# Patient Record
Sex: Male | Born: 1945 | Race: White | Hispanic: No | Smoking: Never smoker
Health system: Southern US, Community
[De-identification: ages and names within clinical notes are randomized; demographics above are authoritative.]

## PROBLEM LIST (undated history)

## (undated) DIAGNOSIS — E559 Vitamin D deficiency, unspecified: Secondary | ICD-10-CM

## (undated) DIAGNOSIS — I10 Essential (primary) hypertension: Secondary | ICD-10-CM

## (undated) DIAGNOSIS — F32A Depression, unspecified: Secondary | ICD-10-CM

## (undated) DIAGNOSIS — N200 Calculus of kidney: Secondary | ICD-10-CM

## (undated) DIAGNOSIS — F329 Major depressive disorder, single episode, unspecified: Secondary | ICD-10-CM

## (undated) DIAGNOSIS — F0390 Unspecified dementia without behavioral disturbance: Secondary | ICD-10-CM

## (undated) DIAGNOSIS — R413 Other amnesia: Secondary | ICD-10-CM

## (undated) DIAGNOSIS — E785 Hyperlipidemia, unspecified: Secondary | ICD-10-CM

## (undated) DIAGNOSIS — N400 Enlarged prostate without lower urinary tract symptoms: Secondary | ICD-10-CM

## (undated) DIAGNOSIS — G4730 Sleep apnea, unspecified: Secondary | ICD-10-CM

## (undated) DIAGNOSIS — F419 Anxiety disorder, unspecified: Secondary | ICD-10-CM

## (undated) DIAGNOSIS — F6081 Narcissistic personality disorder: Secondary | ICD-10-CM

## (undated) DIAGNOSIS — C801 Malignant (primary) neoplasm, unspecified: Secondary | ICD-10-CM

## (undated) HISTORY — PX: EXTERNAL EAR SURGERY: SHX627

## (undated) HISTORY — PX: OTHER SURGICAL HISTORY: SHX169

## (undated) HISTORY — DX: Anxiety disorder, unspecified: F41.9

## (undated) HISTORY — DX: Hyperlipidemia, unspecified: E78.5

## (undated) HISTORY — DX: Vitamin D deficiency, unspecified: E55.9

## (undated) HISTORY — DX: Unspecified dementia, unspecified severity, without behavioral disturbance, psychotic disturbance, mood disturbance, and anxiety: F03.90

## (undated) HISTORY — DX: Calculus of kidney: N20.0

## (undated) HISTORY — DX: Benign prostatic hyperplasia without lower urinary tract symptoms: N40.0

## (undated) HISTORY — PX: EYE SURGERY: SHX253

## (undated) HISTORY — DX: Essential (primary) hypertension: I10

## (undated) HISTORY — DX: Major depressive disorder, single episode, unspecified: F32.9

## (undated) HISTORY — DX: Other amnesia: R41.3

## (undated) HISTORY — DX: Sleep apnea, unspecified: G47.30

## (undated) HISTORY — DX: Depression, unspecified: F32.A

---
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
## 2002-10-17 ENCOUNTER — Ambulatory Visit (HOSPITAL_COMMUNITY): Admission: RE | Admit: 2002-10-17 | Discharge: 2002-10-17 | Payer: Self-pay | Admitting: Cardiology

## 2002-10-17 ENCOUNTER — Encounter: Payer: Self-pay | Admitting: Cardiology

## 2003-12-31 ENCOUNTER — Other Ambulatory Visit (HOSPITAL_COMMUNITY): Admission: RE | Admit: 2003-12-31 | Discharge: 2004-03-30 | Payer: Self-pay | Admitting: Psychiatry

## 2003-12-31 ENCOUNTER — Ambulatory Visit: Payer: Self-pay | Admitting: Psychiatry

## 2004-08-04 ENCOUNTER — Emergency Department (HOSPITAL_COMMUNITY): Admission: EM | Admit: 2004-08-04 | Discharge: 2004-08-04 | Payer: Self-pay | Admitting: Emergency Medicine

## 2008-05-29 ENCOUNTER — Encounter: Admission: RE | Admit: 2008-05-29 | Discharge: 2008-05-29 | Payer: Self-pay | Admitting: Specialist

## 2008-06-12 HISTORY — PX: OTHER SURGICAL HISTORY: SHX169

## 2010-03-17 ENCOUNTER — Emergency Department (HOSPITAL_COMMUNITY)
Admission: EM | Admit: 2010-03-17 | Discharge: 2010-03-17 | Payer: Self-pay | Source: Home / Self Care | Admitting: Emergency Medicine

## 2010-06-21 LAB — POCT I-STAT, CHEM 8
BUN: 17 mg/dL (ref 6–23)
Calcium, Ion: 1.12 mmol/L (ref 1.12–1.32)
Creatinine, Ser: 1.1 mg/dL (ref 0.4–1.5)
Glucose, Bld: 117 mg/dL — ABNORMAL HIGH (ref 70–99)
HCT: 45 % (ref 39.0–52.0)
Potassium: 4.2 mEq/L (ref 3.5–5.1)

## 2010-08-27 NOTE — Cardiovascular Report (Signed)
NAME:  David [name], David [name]                        ACCOUNT NO.:  [id number]   MEDICAL RECORD NO.:  [id number]                   PATIENT TYPE:  OIB   LOCATION:  2857                                 FACILITY:  [facility]   PHYSICIAN:  Bryson Dames, M.D.             DATE OF BIRTH:  [birth year]   DATE OF PROCEDURE:  10/17/2002  DATE OF DISCHARGE:                              CARDIAC CATHETERIZATION   PROCEDURES PERFORMED:  1. Selective coronary angiography by Judkins technique.  2. Retrograde left heart catheterization.  3. Left ventricular angiography.  4. Abdominal aortography.   COMPLICATIONS:  None.   ENTRY SITE:  Right femoral.   DYE USED:  Omnipaque.   PATIENT PROFILE:  The patient is a 57-yerar-old gentleman with an abnormal  electrocardiogram and a Cardiolite stress test showing mild lateral wall  ischemia.  His ejection fraction calculation was 59%.  The patient has had a  history of some obesity with a 50-pound weight loss, some recent dyspnea on  exertion, and history of treated hyperlipidemia.  After talking to the  patient and his wife, who is a Equities trader, we decided to perform  cardiac catheterization to provide some  reassurance about the constellation  of issues here.  This was performed on an outpatient basis electively with 6  French catheters without complications.   RESULTS:  PRESSURES:  The left ventricular pressure was 99991111, end-diastolic  pressure 14.  Central aortic pressure 130/72, mean of 97. No aortic valve  gradient by pullback technique.   ANGIOGRAPHIC RESULTS:  1. The left main coronary artery was short, but large in caliber and normal.  2. The LAD ended just before the apex of the left ventricle.  It was a     normal small vessel.  A single diagonal branch and a single septal     perforator branches were both normal.  3. The left circumflex is nondominant giving rise to a posterior lateral     branch and two small obtuse marginal branches.   No lesions were seen.  4. The right coronary artery is dominant and shows no lesions.  5. Left ventricular angiogram showed a left ventricular ejection fraction of     50-55% with no mitral regurgitation and no wall motion abnormalities.  6. The abdominal aorta showed no abdominal aortic pathology.  Normal common     iliacs and normal renal arteries.   FINAL DIAGNOSES:  1. Angiographically patent coronary arteries.  2. Normal LV systolic function.  3. Normal renal arteries.  4. Normal abdominal aorta.    PLAN:  Reassurance.  Continue risk factor modification and the patient may  resume his previous activities in about three to four days.  Bryson Dames, M.D.    WHG/MEDQ  D:  10/17/2002  T:  10/17/2002  Job:  [id number]  Sherren Kerns. Pamella Pert, M.D.  [place]  [place] [address]  Fax: [phone]   cc:   Sherren Kerns. Pamella Pert, M.D.  [address]  [place]  [place] [address]  Fax: [phone]

## 2011-05-16 DIAGNOSIS — F330 Major depressive disorder, recurrent, mild: Secondary | ICD-10-CM | POA: Insufficient documentation

## 2011-06-27 DIAGNOSIS — I10 Essential (primary) hypertension: Secondary | ICD-10-CM | POA: Insufficient documentation

## 2012-04-02 ENCOUNTER — Ambulatory Visit (INDEPENDENT_AMBULATORY_CARE_PROVIDER_SITE_OTHER): Payer: Medicare Other | Admitting: Emergency Medicine

## 2012-04-02 VITALS — BP 150/80 | HR 68 | Temp 98.1°F | Resp 18 | Ht 72.58 in | Wt 257.2 lb

## 2012-04-02 DIAGNOSIS — J209 Acute bronchitis, unspecified: Secondary | ICD-10-CM

## 2012-04-02 MED ORDER — HYDROCOD POLST-CHLORPHEN POLST 10-8 MG/5ML PO LQCR: 5.0000 mL | Freq: Two times a day (BID) | ORAL | Status: DC | PRN

## 2012-04-02 MED ORDER — PSEUDOEPHEDRINE-GUAIFENESIN ER 60-600 MG PO TB12: 1.0000 | ORAL_TABLET | Freq: Two times a day (BID) | ORAL | Status: AC

## 2012-04-02 NOTE — Progress Notes (Signed)
[facility] [address], [place] [address] [phone]  Date:  04/02/2012   Name:  David [name]   DOB:  10/13/1945   MRN:  [id number]  PCP:  Gerrit Heck, MD    Chief Complaint: Wheezing   History of Present Illness:  David [name] is a 66 y.o. very pleasant male patient who presents with the following:  Treated by [facility] with tamiflu for prophylaxis after he was seen and tested negative for flu.  Has no fever or chills.  Has a mucoid nasal drainage.  Cough that is not productive and is associated with wheezing and some feeling of breathlessness.  No nausea or vomiting.  No improvement with OTC medications.  There is no problem list on file for this patient.   Past Medical History  Diagnosis Date  . Depression   . Anxiety   . Hyperlipidemia     Past Surgical History  Procedure Date  . Right knee meniscus     History  Substance Use Topics  . Smoking status: Never Smoker   . Smokeless tobacco: Not on file  . Alcohol Use: 1.2 oz/week    2 Glasses of wine per week    Family History  Problem Relation Age of Onset  . Alzheimer's disease Mother   . Cancer Father   . Thyroid disease Father   . Alzheimer's disease Maternal Grandmother   . Heart disease Maternal Grandfather   . Stroke Paternal Grandmother   . Stroke Paternal Grandfather     No Known Allergies  Medication list has been reviewed and updated.  Current Outpatient Prescriptions on File Prior to Visit  Medication Sig Dispense Refill  . clonazePAM (KLONOPIN) 1 MG tablet Take 1 mg by mouth daily. Pt taking 1-2 mg qd prn      . sertraline (ZOLOFT) 100 MG tablet Take 200 mg by mouth daily.      . simvastatin (ZOCOR) 40 MG tablet Take 40 mg by mouth every evening.        Review of Systems:  As per HPI, otherwise negative.    Physical Examination: Filed Vitals:   04/02/12 1349  BP: 150/80  Pulse: 68  Temp: 98.1 F (36.7 C)  Resp: 18   Filed Vitals:   04/02/12 1349  Height: 6' 0.58" (1.844 m)  Weight: 257 lb 3.2 oz (116.665 kg)   Body mass index is 34.33 kg/(m^2). Ideal Body Weight: Weight in (lb) to have BMI = 25: 186.9   GEN: WDWN, NAD, Non-toxic, A & O x 3 HEENT: Atraumatic, Normocephalic. Neck supple. No masses, No LAD. Ears and Nose: No external deformity. CV: RRR, No M/G/R. No JVD. No thrill. No extra heart sounds. PULM: CTA B, no wheezes, crackles, rhonchi. No retractions. No resp. distress. No accessory muscle use. ABD: S, NT, ND, +BS. No rebound. No HSM. EXTR: No c/c/e NEURO Normal gait.  PSYCH: Normally interactive. Conversant. Not depressed or anxious appearing.  Calm demeanor.    Assessment and Plan: Bronchitis tussionex mucinex d Continue tamiflu Follow up as needed  Roselee Culver, MD

## 2012-04-02 NOTE — Patient Instructions (Signed)

## 2012-07-10 DIAGNOSIS — E349 Endocrine disorder, unspecified: Secondary | ICD-10-CM | POA: Insufficient documentation

## 2012-07-10 DIAGNOSIS — N400 Enlarged prostate without lower urinary tract symptoms: Secondary | ICD-10-CM | POA: Insufficient documentation

## 2012-07-10 DIAGNOSIS — E785 Hyperlipidemia, unspecified: Secondary | ICD-10-CM | POA: Insufficient documentation

## 2012-07-10 DIAGNOSIS — E8881 Metabolic syndrome: Secondary | ICD-10-CM | POA: Insufficient documentation

## 2014-08-14 DIAGNOSIS — F6081 Narcissistic personality disorder: Secondary | ICD-10-CM

## 2014-08-14 HISTORY — DX: Narcissistic personality disorder: F60.81

## 2015-02-16 ENCOUNTER — Encounter: Payer: Self-pay | Admitting: *Deleted

## 2015-02-16 ENCOUNTER — Ambulatory Visit (INDEPENDENT_AMBULATORY_CARE_PROVIDER_SITE_OTHER): Payer: Medicare Other | Admitting: Diagnostic Neuroimaging

## 2015-02-16 VITALS — BP 141/90 | HR 60 | Wt 245.4 lb

## 2015-02-16 DIAGNOSIS — F329 Major depressive disorder, single episode, unspecified: Secondary | ICD-10-CM

## 2015-02-16 DIAGNOSIS — R413 Other amnesia: Secondary | ICD-10-CM | POA: Diagnosis not present

## 2015-02-16 DIAGNOSIS — F32A Depression, unspecified: Secondary | ICD-10-CM

## 2015-02-16 NOTE — Progress Notes (Signed)
[facility]  PATIENT: David [name] DOB: 10-03-1945  REFERRING CLINICIAN: Rankins  HISTORY FROM: patient  REASON FOR VISIT: new consult / (last seen in 2011)   HISTORICAL  CHIEF COMPLAINT:  No chief complaint on file.   HISTORY OF PRESENT ILLNESS:   NEW HPI (02/16/15, VRP): Since last visit, has had continued significant psycho-social stress (separated from wife 2012, autistic daughter at home, son is living with alcoholic and raising a baby). Still with significant short term memory. Reports sleep apnea diagnosis many years ago (> 97yrs) and did not tolerate CPAP. Patient was a former Engineer, manufacturing systems, and has noted some mild smell loss.   PRIOR HPI (12/09/09, VRP): 69 year old right-handed male with history of anxiety, hypercholesterolemia and family history of Alzheimer's disease presenting for evaluation of mild memory difficulty. Over the past one year patient is noticed some mild difficulty with short-term memory. He gives examples of having mild difficulty with certain tasks and forgetting steps of sequence of events. He may be in one room, think of getting something from a different room, and while he is walking over to the next room, he forgets what he wanted to get. He still remains physically and mentally active. 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  Has a long history of depression and anxiety and is being treated with combination of medications and therapy. He is quite aware of the limitations of treatment for prevention of Alzheimer's disease. However he is hoping to gain some insight into his likelihood of developing Alzheimer's disease so that he can better plan for the future in terms of his family his needs.  REVIEW OF SYSTEMS: Full 14 system review of systems performed and notable only for chills fatigue snoring itching scalp  moles feeling cold snoring joint pain aching muscles insomnia snoring restless legs memory loss confusion tremor depresion anxiety not enough sleep disinterest in activities suicidal thoughts racing thoughts.   ALLERGIES: No Known Allergies  HOME MEDICATIONS: Outpatient Prescriptions Prior to Visit  Medication Sig Dispense Refill  . ALPRAZolam (XANAX) 1 MG tablet Take 1 mg by mouth at bedtime as needed. Pt taking up to 1mg  qd prn    . sertraline (ZOLOFT) 100 MG tablet Take 200 mg by mouth daily.    . simvastatin (ZOCOR) 40 MG tablet Take 40 mg by mouth every evening.    . Tamsulosin HCl (FLOMAX) 0.4 MG CAPS Take 1 mg by mouth daily.    Marland Kitchen UNABLE TO FIND as needed. Testosterone transdermal patch   Used prn    . chlorpheniramine-HYDROcodone (TUSSIONEX PENNKINETIC ER) 10-8 MG/5ML LQCR Take 5 mLs by mouth every 12 (twelve) hours as needed (cough). 60 mL 0  . clonazePAM (KLONOPIN) 1 MG tablet Take 1 mg by mouth daily. Pt taking 1-2 mg qd prn     No facility-administered medications prior to visit.    PAST MEDICAL HISTORY: Past Medical History  Diagnosis Date  . Depression   . Anxiety   . Hyperlipidemia   . Sleep apnea     CPAP  . Hypertension     PAST SURGICAL HISTORY: Past Surgical History  Procedure Laterality Date  . Right knee meniscus  06/12/2008  . External ear surgery      child  . Right talus repair      dislocated    FAMILY HISTORY: Family History  Problem Relation Age  of Onset  . Alzheimer's disease Mother   . Cancer Father     prostate  . Thyroid disease Father   . Alzheimer's disease Maternal Grandmother   . Heart disease Maternal Grandfather   . Stroke Paternal Grandmother   . Stroke Paternal Grandfather   . Alcoholism Brother     SOCIAL HISTORY:  Social History   Social History  . Marital Status: Married    Spouse Name: [name]  . Number of Children: 2  . Years of Education: 20   Occupational History  .       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 Smoking status: Never Smoker   . Smokeless tobacco: Not on file  . Alcohol Use: 1.2 oz/week    2 Glasses of wine per week     Comment: 1-2 shots Vodka daily  . Drug Use: No     Comment: 02/16/15 marijuana rarely  . Sexual Activity: No   Other Topics Concern  . Not on file   Social History Narrative   Lives at home with wife   Caffeine use- 1-2 Cokes, tea daily     PHYSICAL EXAM  GENERAL EXAM/CONSTITUTIONAL: Vitals:  Filed Vitals:   02/16/15 1055  BP: 141/90  Pulse: 60  Weight: 245 lb 6.4 oz (111.313 kg)     Body mass index is 32.74 kg/(m^2).  No exam data present  Patient is in no distress; well developed, nourished and groomed; neck is supple  CARDIOVASCULAR:  Examination of carotid arteries is normal; no carotid bruits  Regular rate and rhythm, no murmurs  Examination of peripheral vascular system by observation and palpation is normal  EYES:  Ophthalmoscopic exam of optic discs and posterior segments is normal; no papilledema or hemorrhages  MUSCULOSKELETAL:  Gait, strength, tone, movements noted in Neurologic exam below  NEUROLOGIC: MENTAL STATUS:  MMSE - Mini Mental State Exam 02/16/2015  Orientation to time 5  Orientation to Place 5  Registration 3  Attention/ Calculation 5  Recall 2  Language- name 2 objects 2  Language- repeat 1  Language- follow 3 step command 3  Language- read & follow direction 1  Write a sentence 1  Copy design 1  Total score 29    awake, alert, oriented to person, place and time  recent and remote memory intact  normal attention and concentration  language fluent, comprehension intact, naming intact,   fund of knowledge appropriate  CRANIAL NERVE:   2nd - no papilledema on fundoscopic exam  2nd, 3rd, 4th, 6th - pupils equal and reactive to light, visual fields full to confrontation, extraocular muscles intact, no nystagmus  5th - facial sensation symmetric  7th - facial strength  symmetric  8th - hearing intact  9th - palate elevates symmetrically, uvula midline  11th - shoulder shrug symmetric  12th - tongue protrusion midline  MOTOR:   normal bulk and tone, full strength in the BUE, BLE  SENSORY:   normal and symmetric to light touch, pinprick, temperature, vibration  COORDINATION:   finger-nose-finger, fine finger movements normal  REFLEXES:   deep tendon reflexes SLIGHTLY BRISK and symmetric  GAIT/STATION:   narrow based gait; romberg is negative    DIAGNOSTIC DATA (LABS, IMAGING, TESTING) - I reviewed patient records, labs, notes, testing and imaging myself where available.  Lab Results  Component Value Date   HGB 15.3 03/17/2010   HCT 45.0 03/17/2010      Component Value Date/Time   NA 139  03/17/2010 1401   K 4.2 03/17/2010 1401   CL 106 03/17/2010 1401   GLUCOSE 117* 03/17/2010 1401   BUN 17 03/17/2010 1401   CREATININE 1.1 03/17/2010 1401   No results found for: CHOL, HDL, LDLCALC, LDLDIRECT, TRIG, CHOLHDL No results found for: HGBA1C No results found for: VITAMINB12 No results found for: TSH   12/16/09 MRI brain  - normal     ASSESSMENT AND PLAN  69 y.o. year old male here with memory loss since 2010-2011.    Ddx: MCI, mild dementia, pseudo-dementia of depression, sleep disturbance  Memory loss  Depression    PLAN: - consider IDEAS study - then may consider sleep study - then may repeat neuropsychology testing (baseline was in 2011) - then may consider memantine  Return in about 3 months (around 05/19/2015).    Penni Bombard, MD [id number], [id number] AM Certified in Neurology, Neurophysiology and Neuroimaging  [facility] [address] [place], [place] [address] [phone]

## 2015-02-16 NOTE — Patient Instructions (Signed)
Thank you for coming to see [place] at [facility]. I hope we have been able to provide you high quality care today.  You may receive a patient satisfaction survey over the next few weeks. We would appreciate your feedback and comments so that we may continue to improve ourselves and the health of our patients.  - consider IDEAS study - then may consider sleep study - then may repeat neuropsychology testing (baseline was in 2011) - then may consider memantine   ~~~~~~~~~~~~~~~~~~~~~~~~~~~~~~~~~~~~~~~~~~~~~~~~~~~~~~~~~~~~~~~~~  DR. PENUMALLI'S GUIDE TO HAPPY AND HEALTHY LIVING These are some of my general health and wellness recommendations. Some of them may apply to you better than others. Please use common sense as you try these suggestions and feel free to ask me any questions.   ACTIVITY/FITNESS Mental, social, emotional and physical stimulation are very important for brain and body health. Try learning a new activity (arts, music, language, sports, games).  Keep moving your body to the best of your abilities. You can do this at home, inside or outside, the park, community center, gym or anywhere you like. Consider a physical therapist or personal trainer to get started. Consider the app Sworkit. Fitness trackers such as smart-watches, smart-phones or Fitbits can help as well.   NUTRITION Eat more plants: colorful vegetables, nuts, seeds and berries.  Eat less sugar, salt, preservatives and processed foods.  Avoid toxins such as cigarettes and alcohol.  Drink water when you are thirsty. Warm water with a slice of lemon is an excellent morning drink to start the day.  Consider these websites for more information The Nutrition Source (https://www.henry-hernandez.biz/) Precision Nutrition (WindowBlog.ch)   RELAXATION Consider practicing mindfulness meditation or other relaxation techniques such as deep breathing, prayer,  yoga, tai chi, massage. See website mindful.org or the apps Headspace or Calm to help get started.   SLEEP Try to get at least 7-8+ hours sleep per day. Regular exercise and reduced caffeine will help you sleep better. Practice good sleep hygeine techniques. See website sleep.org for more information.   PLANNING Prepare estate planning, living will, healthcare POA documents. Sometimes this is best planned with the help of an attorney. Theconversationproject.org and agingwithdignity.org are excellent resources.

## 2015-02-23 ENCOUNTER — Telehealth: Payer: Self-pay

## 2015-02-23 NOTE — Telephone Encounter (Signed)
I spoke to the patient in regards to the CREAD study. Patient would like to schedule an appointment by email first to talk about the study, as the patient has yet to read the Informed Consent Form. I told the patient that I will email him in a week to setup a phone call.

## 2015-05-13 ENCOUNTER — Ambulatory Visit (INDEPENDENT_AMBULATORY_CARE_PROVIDER_SITE_OTHER): Payer: Medicare Other | Admitting: Diagnostic Neuroimaging

## 2015-05-13 ENCOUNTER — Encounter: Payer: Self-pay | Admitting: Diagnostic Neuroimaging

## 2015-05-13 VITALS — BP 118/72 | HR 62 | Ht 72.0 in | Wt 230.2 lb

## 2015-05-13 DIAGNOSIS — G4733 Obstructive sleep apnea (adult) (pediatric): Secondary | ICD-10-CM

## 2015-05-13 DIAGNOSIS — F32A Depression, unspecified: Secondary | ICD-10-CM

## 2015-05-13 DIAGNOSIS — R413 Other amnesia: Secondary | ICD-10-CM

## 2015-05-13 DIAGNOSIS — F329 Major depressive disorder, single episode, unspecified: Secondary | ICD-10-CM

## 2015-05-13 NOTE — Progress Notes (Signed)
[facility]  PATIENT: David [name] DOB: 06-May-1945  REFERRING CLINICIAN: Rankins  HISTORY FROM: patient  REASON FOR VISIT: follow up   HISTORICAL  CHIEF COMPLAINT:  Chief Complaint  Patient presents with  . Memory Loss    rm 7, MMSE 30, depression  . Follow-up    3 month    HISTORY OF PRESENT ILLNESS:   UPDATE 05/13/15: Since last visit, symptoms continue, esp significant depression and stress related to breakup of his marriage. Noted some stuttering at a family event recently. Some more dizziness spells when standing up.   NEW HPI (02/16/15, VRP): Since last visit, has had continued significant psycho-social stress (separated from wife 2012, autistic daughter at home, son is living with alcoholic and raising a baby). Still with significant short term memory. Reports sleep apnea diagnosis many years ago (> 45yrs) and did not tolerate CPAP. Patient was a former Engineer, manufacturing systems, and has noted some mild smell loss.   PRIOR HPI (12/09/09, VRP): 70 year old right-handed male with history of anxiety, hypercholesterolemia and family history of Alzheimer's disease presenting for evaluation of mild memory difficulty. Over the past one year patient is noticed some mild difficulty with short-term memory. He gives examples of having mild difficulty with certain tasks and forgetting steps of sequence of events. He may be in one room, think of getting something from a different room, and while he is walking over to the next room, he forgets what he wanted to get. He still remains physically and mentally active. 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  Has a long history of depression and anxiety and is being treated with combination of medications and therapy. He is quite aware of the limitations of treatment for prevention of Alzheimer's disease. However  he is hoping to gain some insight into his likelihood of developing Alzheimer's disease so that he can better plan for the future in terms of his family his needs.  REVIEW OF SYSTEMS: Full 14 system review of systems performed and notable only for fatigue scalp snoring insomnia restless legs memory loss confusion tremor depresion anxiety disinterest in activities suicidal thoughts racing thoughts.   ALLERGIES: No Known Allergies  HOME MEDICATIONS: Outpatient Prescriptions Prior to Visit  Medication Sig Dispense Refill  . ALPRAZolam (XANAX) 1 MG tablet Take 1 mg by mouth at bedtime as needed. Pt taking up to 1mg  qd prn    . lamoTRIgine (LAMICTAL) 25 MG tablet Take 25 mg by mouth daily.    . propranolol (INDERAL) 10 MG tablet Take 10 mg by mouth daily.    . sertraline (ZOLOFT) 100 MG tablet Take 200 mg by mouth daily.    . simvastatin (ZOCOR) 40 MG tablet Take 40 mg by mouth every evening.    . Tamsulosin HCl (FLOMAX) 0.4 MG CAPS Take 1 mg by mouth daily.    Marland Kitchen UNABLE TO FIND as needed. Testosterone transdermal patch   Used prn     No facility-administered medications prior to visit.    PAST MEDICAL HISTORY: Past Medical History  Diagnosis Date  . Depression   . Anxiety   . Hyperlipidemia   . Sleep apnea     CPAP  . Hypertension     PAST SURGICAL HISTORY: Past Surgical History  Procedure Laterality Date  . Right knee meniscus  06/12/2008  . External ear surgery  child  . Right talus repair      dislocated    FAMILY HISTORY: Family History  Problem Relation Age of Onset  . Alzheimer's disease Mother   . Cancer Father     prostate  . Thyroid disease Father   . Alzheimer's disease Maternal Grandmother   . Heart disease Maternal Grandfather   . Stroke Paternal Grandmother   . Stroke Paternal Grandfather   . Alcoholism Brother     SOCIAL HISTORY:  Social History   Social History  . Marital Status: Married    Spouse Name: [name]  . Number of Children: 2  .  Years of Education: 20   Occupational History  .       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 Smoking status: Never Smoker   . Smokeless tobacco: Not on file  . Alcohol Use: 1.2 oz/week    2 Glasses of wine per week     Comment: 1-2 shots Vodka daily  . Drug Use: No     Comment: 02/16/15 marijuana rarely, 05/13/15 occassionally  . Sexual Activity: No   Other Topics Concern  . Not on file   Social History Narrative   Lives at home with wife   Caffeine use- 1-2 Cokes, tea daily     PHYSICAL EXAM  GENERAL EXAM/CONSTITUTIONAL: Vitals:  Filed Vitals:   05/13/15 1307  BP: 118/72  Pulse: 62  Height: 6' (1.829 m)  Weight: 230 lb 3.2 oz (104.418 kg)   Body mass index is 31.21 kg/(m^2). No exam data present  Patient is in no distress; well developed, nourished and groomed; neck is supple  CARDIOVASCULAR:  Examination of carotid arteries is normal; no carotid bruits  Regular rate and rhythm, no murmurs  Examination of peripheral vascular system by observation and palpation is normal   MUSCULOSKELETAL:  Gait, strength, tone, movements noted in Neurologic exam below  NEUROLOGIC: MENTAL STATUS:  MMSE - [place] Exam 05/13/2015 02/16/2015  Orientation to time 5 5  Orientation to Place 5 5  Registration 3 3  Attention/ Calculation 5 5  Recall 3 2  Language- name 2 objects 2 2  Language- repeat 1 1  Language- follow 3 step command 3 3  Language- read & follow direction 1 1  Write a sentence 1 1  Copy design 1 1  Total score 30 29    awake, alert, oriented to person, place and time  recent and remote memory intact  normal attention and concentration  language fluent, comprehension intact, naming intact,   fund of knowledge appropriate  CRANIAL NERVE:   2nd, 3rd, 4th, 6th - pupils equal and reactive to light, visual fields full to confrontation, extraocular muscles intact, no nystagmus  5th - facial sensation symmetric  7th - facial  strength symmetric  8th - hearing intact  9th - palate elevates symmetrically, uvula midline  11th - shoulder shrug symmetric  12th - tongue protrusion midline  MOTOR:   normal bulk and tone, full strength in the BUE, BLE  SENSORY:   normal and symmetric to light touch, temperature, vibration  COORDINATION:   finger-nose-finger, fine finger movements normal  REFLEXES:   deep tendon reflexes SLIGHTLY BRISK and symmetric  GAIT/STATION:   narrow based gait; tandem stable; romberg is negative    DIAGNOSTIC DATA (LABS, IMAGING, TESTING) - I reviewed patient records, labs, notes, testing and imaging myself where available.  Lab Results  Component Value Date   HGB 15.3 03/17/2010  HCT 45.0 03/17/2010      Component Value Date/Time   NA 139 03/17/2010 1401   K 4.2 03/17/2010 1401   CL 106 03/17/2010 1401   GLUCOSE 117* 03/17/2010 1401   BUN 17 03/17/2010 1401   CREATININE 1.1 03/17/2010 1401   No results found for: CHOL, HDL, LDLCALC, LDLDIRECT, TRIG, CHOLHDL No results found for: HGBA1C No results found for: VITAMINB12 No results found for: TSH   12/16/09 MRI brain  - normal     ASSESSMENT AND PLAN  70 y.o. year old male here with memory loss since 2010-2011. More depression lately.    Ddx: MCI, mild dementia, pseudo-dementia of depression, sleep disturbance  Memory loss  Depression    PLAN: - consider IDEAS study - setup sleep study - follow up with psychiatry/psychology re: depression  Orders Placed This Encounter  Procedures  . Ambulatory referral to Sleep Studies   Return in about 6 months (around 11/10/2015).    Penni Bombard, MD [id number], [id number] PM Certified in Neurology, Neurophysiology and Neuroimaging  [facility] [address], [place], [place] [address] [phone]

## 2015-05-13 NOTE — Patient Instructions (Signed)
Thank you for coming to see [place] at [facility]. I hope we have been able to provide you high quality care today.  You may receive a patient satisfaction survey over the next few weeks. We would appreciate your feedback and comments so that we may continue to improve ourselves and the health of our patients.  - I will setup sleep study - Follow up with psychiatry/psychology   ~~~~~~~~~~~~~~~~~~~~~~~~~~~~~~~~~~~~~~~~~~~~~~~~~~~~~~~~~~~~~~~~~  DR. PENUMALLI'S GUIDE TO HAPPY AND HEALTHY LIVING These are some of my general health and wellness recommendations. Some of them may apply to you better than others. Please use common sense as you try these suggestions and feel free to ask me any questions.   ACTIVITY/FITNESS Mental, social, emotional and physical stimulation are very important for brain and body health. Try learning a new activity (arts, music, language, sports, games).  Keep moving your body to the best of your abilities. You can do this at home, inside or outside, the park, community center, gym or anywhere you like. Consider a physical therapist or personal trainer to get started. Consider the app Sworkit. Fitness trackers such as smart-watches, smart-phones or Fitbits can help as well.   NUTRITION Eat more plants: colorful vegetables, nuts, seeds and berries.  Eat less sugar, salt, preservatives and processed foods.  Avoid toxins such as cigarettes and alcohol.  Drink water when you are thirsty. Warm water with a slice of lemon is an excellent morning drink to start the day.  Consider these websites for more information The Nutrition Source (https://www.henry-hernandez.biz/) Precision Nutrition (WindowBlog.ch)   RELAXATION Consider practicing mindfulness meditation or other relaxation techniques such as deep breathing, prayer, yoga, tai chi, massage. See website mindful.org or the apps Headspace or Calm to help  get started.   SLEEP Try to get at least 7-8+ hours sleep per day. Regular exercise and reduced caffeine will help you sleep better. Practice good sleep hygeine techniques. See website sleep.org for more information.   PLANNING Prepare estate planning, living will, healthcare POA documents. Sometimes this is best planned with the help of an attorney. Theconversationproject.org and agingwithdignity.org are excellent resources.

## 2015-05-25 ENCOUNTER — Ambulatory Visit (INDEPENDENT_AMBULATORY_CARE_PROVIDER_SITE_OTHER): Payer: Medicare Other | Admitting: Neurology

## 2015-05-25 ENCOUNTER — Encounter: Payer: Self-pay | Admitting: Neurology

## 2015-05-25 VITALS — BP 126/78 | HR 70 | Resp 20 | Ht 72.0 in | Wt 234.0 lb

## 2015-05-25 DIAGNOSIS — R0683 Snoring: Secondary | ICD-10-CM | POA: Diagnosis not present

## 2015-05-25 DIAGNOSIS — G4719 Other hypersomnia: Secondary | ICD-10-CM | POA: Diagnosis not present

## 2015-05-25 DIAGNOSIS — F329 Major depressive disorder, single episode, unspecified: Secondary | ICD-10-CM

## 2015-05-25 DIAGNOSIS — R351 Nocturia: Secondary | ICD-10-CM | POA: Diagnosis not present

## 2015-05-25 NOTE — Progress Notes (Signed)
SLEEP MEDICINE CLINIC   Provider:  Larey Seat, M D  Referring Provider: Leighton Ruff, MD Primary Care Physician:  Gerrit Heck, MD  Chief Complaint  Patient presents with  . New Patient (Initial Visit)    [name] referral, snores at night, rm 11, alone    HPI:  David [name] is a 70 y.o. male , seen here as a referral from [name] for a sleep evaluation,   Chief complaint according to patient :  "I have memory loss and I snore- could one be a cause for the other?" The patient endorsed the geriatric depression score at 13 points, the Epworth sleepiness score at 11 points and the fatigue severity score at 38 points. 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  He has always been physically very active but was diagnosed with hypo-testosteronemia and started on a supplement by skin. He became irritable and his personality changed under testosterone supplementation. He has for that reason had the medication discontinued. He takes Tamsolusin against BPH and controls his nocturia. About 2-3 years ago he had 3-4 nocturias at night. He is severely depressed, is separated from his wife of 28 years, has a 58 year old autistic daughter and a 42 year old mother with Alzheimer's. His son interrupted his college and works in a blue collar job, has a girlfriend that has a 31 month old child but not from him, and is expecting another.    Sleep habits are as follows: The patient usually goes to the bedroom at her 1 1 AM and falls asleep rather promptly he would team he wakes up spontaneously during the night, but has not as many nocturia breaks as he used to. He rises in the morning at about 8 AM, without an alarm be needed. He is exercising physically in the morning. He is exposing himself to natural daylight. His overall nocturnal sleep time is about 7 hours and he wakes  usually refreshed -he does wake with a dry mouth but not with headaches. He keeps an exercise routine and he has also made dietary changes allowing him to lose weight. He usually eats a light lunch, he hydrates well. He naps 2-3 times a week for a duration of about 60-90 minutes. His nocturnal sleep is uninterrupted by anything such as snoring, gasping, choking. He does  report nightmares infrequently. He will wake in AM with a memory of a dream.  He has not experienced sleep paralysis. He didn't act out dreams, has no night terrors , seizures or sleep walking.   Sleep medical history and family sleep history:  Family history negative for sleep apnea.  Social history: father of a daughter, working as an Nurse, children's, sings in 23 languages. He has a chemistry degree, and is multilingual.    Review of Systems: Out of a complete 14 system review, the patient complains of only the following symptoms, and all other reviewed systems are negative. Memory loss, insomnia, snoring, Restless legs, dizziness, confusion. suicidal thoughts in the past, major depression a treated by Dr. Drema Dallas.   Epworth score 11 .  Social History   Social History  . Marital Status: Married    Spouse Name: [name]  . Number of Children: 2  . Years of Education: 20   Occupational History  .       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 Smoking  status: Never Smoker   . Smokeless tobacco: Not on file  . Alcohol Use: 1.2 oz/week    2 Glasses of wine per week     Comment: 1-2 shots Vodka daily  . Drug Use: No     Comment: 02/16/15 marijuana rarely, 05/13/15 occassionally  . Sexual Activity: No   Other Topics Concern  . Not on file   Social History Narrative   Lives at home with wife   Caffeine use- 1-2 Cokes, tea daily    Family History  Problem Relation Age of Onset  . Alzheimer's disease Mother   . Cancer Father     prostate  . Thyroid disease Father   . Alzheimer's disease Maternal  Grandmother   . Heart disease Maternal Grandfather   . Stroke Paternal Grandmother   . Stroke Paternal Grandfather   . Alcoholism Brother     Past Medical History  Diagnosis Date  . Depression   . Anxiety   . Hyperlipidemia   . Sleep apnea     CPAP  . Hypertension     Past Surgical History  Procedure Laterality Date  . Right knee meniscus  06/12/2008  . External ear surgery      child  . Right talus repair      dislocated    Current Outpatient Prescriptions  Medication Sig Dispense Refill  . ALPRAZolam (XANAX) 1 MG tablet Take 1 mg by mouth at bedtime as needed. Pt taking up to 1mg  qd prn    . hydrOXYzine (ATARAX/VISTARIL) 25 MG tablet Take 25 mg by mouth at bedtime.     . lamoTRIgine (LAMICTAL) 25 MG tablet Take 25 mg by mouth daily.    . propranolol (INDERAL) 10 MG tablet Take 10 mg by mouth daily.    . sertraline (ZOLOFT) 100 MG tablet Take 200 mg by mouth daily.    . simvastatin (ZOCOR) 40 MG tablet Take 40 mg by mouth every evening.    . Tamsulosin HCl (FLOMAX) 0.4 MG CAPS Take 1 mg by mouth daily.     No current facility-administered medications for this visit.    Allergies as of 05/25/2015  . (No Known Allergies)    Vitals: BP 126/78 mmHg  Pulse 70  Resp 20  Ht 6' (1.829 m)  Wt 234 lb (106.142 kg)  BMI 31.73 kg/m2 Last Weight:  Wt Readings from Last 1 Encounters:  05/25/15 234 lb (106.142 kg)   [id number] mass index is 31.73 kg/(m^2).     Last Height:   Ht Readings from Last 1 Encounters:  05/25/15 6' (1.829 m)    Physical exam:  General: The patient is awake, alert and appears not in acute distress. The patient is well groomed. Head: Normocephalic, atraumatic. Neck is supple. Mallampati 3,  neck circumference: 17. Nasal airflow  Unrestricted , TMJ is evident . Retrognathia is seen.  Cardiovascular:  Regular rate and rhythm , without  murmurs or carotid bruit, and without distended neck veins. Respiratory: Lungs are clear to auscultation. Skin:   Without evidence of edema, or rash Trunk: BMI is elevated  The patient's posture is erect.   Neurologic exam : The patient is awake and alert, oriented to place and time.    Memory testing pre [name]   MOCA:No flowsheet data found. MMSE: MMSE - Mini Mental State Exam 05/13/2015 02/16/2015  Orientation to time 5 5  Orientation to Place 5 5  Registration 3 3  Attention/ Calculation 5 5  Recall 3 2  Language-  name 2 objects 2 2  Language- repeat 1 1  Language- follow 3 step command 3 3  Language- read & follow direction 1 1  Write a sentence 1 1  Copy design 1 1  Total score 30 29       Attention span & concentration ability appears normal.  Speech is fluent,  without dysarthria, dysphonia or aphasia.  Mood and affect are appropriate.  Cranial nerves: Pupils are equal and briskly reactive to light. Funduscopic exam without evidence of pallor or edema. Extraocular movements  in vertical and horizontal planes intact and without nystagmus. Visual fields by finger perimetry are intact. Hearing to finger rub intact.   Facial sensation intact to fine touch.  Facial motor strength is symmetric and tongue and uvula move midline. Shoulder shrug was symmetrical.   Motor exam:   tone, muscle bulk and symmetric strength in all extremities.  Sensory:  Fine touch, pinprick and vibration , Proprioception tested in the upper extremities was normal.  Coordination: Rapid alternating movements ,Finger-to-nose maneuver without evidence of ataxia, dysmetria or tremor.  Gait and station: Patient walks without assistive device and is able unassisted to climb up to the exam table. Strength within normal limits.  Stance is stable and normal .Tandem gait is unfragmented. Turns with 3 Steps.   Deep tendon reflexes: in the upper and lower extremities are symmetric and intact. Babinski maneuver response is downgoing.  The patient was advised of the nature of the diagnosed sleep disorder , the  treatment options and risks for general a health and wellness arising from not treating the condition.  I spent more than 45 minutes of face to face time with the patient. Greater than 50% of time was spent in counseling and coordination of care. We have discussed the diagnosis and differential and I answered the patient's questions.     Assessment:  After physical and neurologic examination, review of laboratory studies,   Personal review of imaging studies, reports of other /same  Imaging studies, neurophysiology testing and pre-existing records as far as provided in visit., my assessment is   1) patient may have more fatigue and sleepiness related to his clinical depression and stressors of his social environs.   2) snoring- rule out sleep apnea - if possible by HST.   3) no capnography needed.    Plan:  Treatment plan and additional workup :   PSG ,  RV after sleep testing, If negative, will return to [name].     Asencion Partridge Kenia Teagarden MD  05/25/2015   CC; Andrey Spearman MD  CC: Leighton Ruff, [address] [place], [place] [address]

## 2015-06-10 ENCOUNTER — Ambulatory Visit (INDEPENDENT_AMBULATORY_CARE_PROVIDER_SITE_OTHER): Payer: Medicare Other | Admitting: Neurology

## 2015-06-10 DIAGNOSIS — R351 Nocturia: Secondary | ICD-10-CM

## 2015-06-10 DIAGNOSIS — G4719 Other hypersomnia: Secondary | ICD-10-CM

## 2015-06-10 DIAGNOSIS — R0683 Snoring: Secondary | ICD-10-CM

## 2015-06-10 DIAGNOSIS — F329 Major depressive disorder, single episode, unspecified: Secondary | ICD-10-CM

## 2015-06-10 DIAGNOSIS — G4710 Hypersomnia, unspecified: Secondary | ICD-10-CM

## 2015-06-11 NOTE — Sleep Study (Signed)
Please see the scanned sleep study interpretation located in the procedure tab within the chart review section.   

## 2015-06-17 ENCOUNTER — Telehealth: Payer: Self-pay

## 2015-06-17 DIAGNOSIS — G4761 Periodic limb movement disorder: Secondary | ICD-10-CM

## 2015-06-17 DIAGNOSIS — G4733 Obstructive sleep apnea (adult) (pediatric): Secondary | ICD-10-CM

## 2015-06-17 NOTE — Telephone Encounter (Signed)
Patient returned call. He is aware of results and recommendations and is willing to proceed with treatment. Order is in. I will send report to PCP and Tito Dine per patient request.

## 2015-06-17 NOTE — Telephone Encounter (Signed)
LM for patient to call back for sleep study results. I will put in order for titration study.

## 2015-07-26 ENCOUNTER — Ambulatory Visit (INDEPENDENT_AMBULATORY_CARE_PROVIDER_SITE_OTHER): Payer: Medicare Other | Admitting: Neurology

## 2015-07-26 DIAGNOSIS — G4733 Obstructive sleep apnea (adult) (pediatric): Secondary | ICD-10-CM

## 2015-07-26 DIAGNOSIS — G4761 Periodic limb movement disorder: Secondary | ICD-10-CM

## 2015-07-27 NOTE — Sleep Study (Signed)
Please see the scanned sleep study interpretation located in the Procedure tab within the Chart Review section. 

## 2015-07-30 ENCOUNTER — Telehealth: Payer: Self-pay

## 2015-07-30 DIAGNOSIS — G4733 Obstructive sleep apnea (adult) (pediatric): Secondary | ICD-10-CM

## 2015-07-30 NOTE — Telephone Encounter (Signed)
Spoke to pt. I advised him that I have his sleep study results. He says that he is out to lunch and will call me later to discuss his sleep study results. I gave him our clinic phone number and my name.

## 2015-07-30 NOTE — Telephone Encounter (Signed)
Spoke to pt. I advised him that his sleep study results showed significant improvement with less respiratory events during titration. Dr. Brett Fairy recommends that pt start a cpap. Pt states that he is "worried about the economics of starting a new cpap" and wants to "compare the new data to the data from his old sleep study" before making a decision. I advised pt that there were frequent PLMS of his sleep that was associated with sleep disruption and treatment could be considered if the PLMS do not resolve with cpap therapy. I advised pt to avoid caffeine containing beverages and chocolate.  I offered a follow up appt to discuss with Dr. Brett Fairy but pt declined, he wants to look at his sleep study results first. He says he is coming by this afternoon to pick up the sleep study results and then he will call me with a decision regarding an office visit. Pt is asking me to go ahead and send the order for cpap to a DME so he can get pricing on it. I assured him that sending an order to a DME will only give him pricing and he does not have to get the cpap if after he gets the pricing, he decides the cpap is too expensive. Will send to [facility]. A copy of pt's sleep study was placed at the front desk. Pt was advised to not drive or operate hazardous machinery when sleepy. Pt verbalized understanding of all the above information.  Pt was advised that he must sign a medical records release form before he can pick up his sleep study results. Pt verbalized understanding.

## 2015-07-30 NOTE — Telephone Encounter (Signed)
Pt returned Kristen's call °

## 2015-07-31 DIAGNOSIS — Z0289 Encounter for other administrative examinations: Secondary | ICD-10-CM

## 2015-08-17 ENCOUNTER — Encounter: Payer: Self-pay | Admitting: *Deleted

## 2015-11-12 ENCOUNTER — Ambulatory Visit (INDEPENDENT_AMBULATORY_CARE_PROVIDER_SITE_OTHER): Payer: Medicare Other | Admitting: Diagnostic Neuroimaging

## 2015-11-12 ENCOUNTER — Encounter: Payer: Self-pay | Admitting: Diagnostic Neuroimaging

## 2015-11-12 VITALS — BP 106/64 | HR 75 | Wt 234.0 lb

## 2015-11-12 DIAGNOSIS — G4733 Obstructive sleep apnea (adult) (pediatric): Secondary | ICD-10-CM

## 2015-11-12 DIAGNOSIS — R413 Other amnesia: Secondary | ICD-10-CM

## 2015-11-12 DIAGNOSIS — F329 Major depressive disorder, single episode, unspecified: Secondary | ICD-10-CM | POA: Diagnosis not present

## 2015-11-12 DIAGNOSIS — F32A Depression, unspecified: Secondary | ICD-10-CM

## 2015-11-12 NOTE — Progress Notes (Signed)
[facility]  PATIENT: David [name] DOB: 1945/04/16  REFERRING CLINICIAN: Rankins  HISTORY FROM: patient  REASON FOR VISIT: follow up   HISTORICAL  CHIEF COMPLAINT:  Chief Complaint  Patient presents with  . Memory Loss    rm 7, MMSE 30  . Follow-up    6 month    HISTORY OF PRESENT ILLNESS:   UPDATE 11/12/15: Since last visit, memory loss is stable. Had sleep study, now dx'd with OSA, and recommended to have CPAP (pt not started yet, due to finances). Still with sig family stress (wife, daughter, son).   UPDATE 05/13/15: Since last visit, symptoms continue, esp significant depression and stress related to breakup of his marriage. Noted some stuttering at a family event recently. Some more dizziness spells when standing up.   NEW HPI (02/16/15, VRP): Since last visit, has had continued significant psycho-social stress (separated from wife 2012, autistic daughter at home, son is living with alcoholic and raising a baby). Still with significant short term memory. Reports sleep apnea diagnosis many years ago (> 65yrs) and did not tolerate CPAP. Patient was a former Engineer, manufacturing systems, and has noted some mild smell loss.   PRIOR HPI (12/09/09, VRP): 70 year old right-handed male with history of anxiety, hypercholesterolemia and family history of Alzheimer's disease presenting for evaluation of mild memory difficulty. Over the past one year patient is noticed some mild difficulty with short-term memory. He gives examples of having mild difficulty with certain tasks and forgetting steps of sequence of events. He may be in one room, think of getting something from a different room, and while he is walking over to the next room, he forgets what he wanted to get. He still remains physically and mentally active. 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  Has a  long history of depression and anxiety and is being treated with combination of medications and therapy. He is quite aware of the limitations of treatment for prevention of Alzheimer's disease. However he is hoping to gain some insight into his likelihood of developing Alzheimer's disease so that he can better plan for the future in terms of his family his needs.   REVIEW OF SYSTEMS: Full 14 system review of systems performed and negative except tremor agitation confusion depression anxiety suicidal thoughts.   ALLERGIES: No Known Allergies  HOME MEDICATIONS: Outpatient Medications Prior to Visit  Medication Sig Dispense Refill  . ALPRAZolam (XANAX) 1 MG tablet Take 1 mg by mouth at bedtime as needed. Pt taking up to 1mg  qd prn    . hydrOXYzine (ATARAX/VISTARIL) 25 MG tablet Take 25 mg by mouth at bedtime.     . lamoTRIgine (LAMICTAL) 25 MG tablet Take 25 mg by mouth daily.    . propranolol (INDERAL) 10 MG tablet Take 10 mg by mouth daily.    . sertraline (ZOLOFT) 100 MG tablet Take 200 mg by mouth daily.    . simvastatin (ZOCOR) 40 MG tablet Take 40 mg by mouth every evening.    . Tamsulosin HCl (FLOMAX) 0.4 MG CAPS Take 1 mg by mouth daily.     No facility-administered medications prior to visit.     PAST MEDICAL HISTORY: Past Medical History:  Diagnosis Date  . Anxiety   . Depression   . Hyperlipidemia   . Hypertension   . Sleep apnea    CPAP    PAST  SURGICAL HISTORY: Past Surgical History:  Procedure Laterality Date  . EXTERNAL EAR SURGERY     child  . EYE SURGERY Bilateral    cataract  . right knee meniscus  06/12/2008  . right talus repair     dislocated    FAMILY HISTORY: Family History  Problem Relation Age of Onset  . Alzheimer's disease Mother   . Cancer Father     prostate  . Thyroid disease Father   . Alzheimer's disease Maternal Grandmother   . Heart disease Maternal Grandfather   . Stroke Paternal Grandmother   . Stroke Paternal Grandfather   .  Alcoholism Brother     SOCIAL HISTORY:  Social History   Social History  . Marital status: Legally Separated    Spouse name: [name]  . Number of children: 2  . Years of education: 20   Occupational History  .       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 Smoking status: Never Smoker  . Smokeless tobacco: Not on file  . Alcohol use 1.2 oz/week    2 Glasses of wine per week     Comment: 1-2 shots Vodka daily  . Drug use: No     Comment: 02/16/15 marijuana rarely, 05/13/15 occassionally  . Sexual activity: No   Other Topics Concern  . Not on file   Social History Narrative   Lives at home with wife   Caffeine use- 1-2 Cokes, tea daily     PHYSICAL EXAM  GENERAL EXAM/CONSTITUTIONAL: Vitals:  Vitals:   11/12/15 1355  BP: 106/64  Pulse: 75  Weight: 234 lb (106.1 kg)   Wt Readings from Last 3 Encounters:  11/12/15 234 lb (106.1 kg)  05/25/15 234 lb (106.1 kg)  05/13/15 230 lb 3.2 oz (104.4 kg)    Body mass index is 31.74 kg/m. No exam data present  Patient is in no distress; well developed, nourished and groomed; neck is supple  CARDIOVASCULAR:  Examination of carotid arteries is normal; no carotid bruits  Regular rate and rhythm, no murmurs  Examination of peripheral vascular system by observation and palpation is normal   MUSCULOSKELETAL:  Gait, strength, tone, movements noted in Neurologic exam below  NEUROLOGIC: MENTAL STATUS:  MMSE - [place] Exam 11/12/2015 05/13/2015 02/16/2015  Orientation to time 5 5 5   Orientation to Place 5 5 5   Registration 3 3 3   Attention/ Calculation 5 5 5   Recall 3 3 2   Language- name 2 objects 2 2 2   Language- repeat 1 1 1   Language- follow 3 step command 3 3 3   Language- read & follow direction 1 1 1   Write a sentence 1 1 1   Copy design 1 1 1   Total score 30 30 29     awake, alert, oriented to person, place and time  recent and remote memory intact  normal attention and  concentration  language fluent, comprehension intact, naming intact,   fund of knowledge appropriate  CRANIAL NERVE:   2nd, 3rd, 4th, 6th - pupils equal and reactive to light, visual fields full to confrontation, extraocular muscles intact, no nystagmus  5th - facial sensation symmetric  7th - facial strength symmetric  8th - hearing intact  9th - palate elevates symmetrically, uvula midline  11th - shoulder shrug symmetric  12th - tongue protrusion midline  MOTOR:   normal bulk and tone, full strength in the BUE, BLE  SENSORY:   normal and symmetric to light touch,  temperature, vibration  COORDINATION:   finger-nose-finger, fine finger movements normal  REFLEXES:   deep tendon reflexes SLIGHTLY BRISK and symmetric  GAIT/STATION:   narrow based gait; tandem stable; romberg is negative    DIAGNOSTIC DATA (LABS, IMAGING, TESTING) - I reviewed patient records, labs, notes, testing and imaging myself where available.  Lab Results  Component Value Date   HGB 15.3 03/17/2010   HCT 45.0 03/17/2010      Component Value Date/Time   NA 139 03/17/2010 1401   K 4.2 03/17/2010 1401   CL 106 03/17/2010 1401   GLUCOSE 117 (H) 03/17/2010 1401   BUN 17 03/17/2010 1401   CREATININE 1.1 03/17/2010 1401   No results found for: CHOL, HDL, LDLCALC, LDLDIRECT, TRIG, CHOLHDL No results found for: HGBA1C No results found for: VITAMINB12 No results found for: TSH   12/16/09 MRI brain  - normal     ASSESSMENT AND PLAN  70 y.o. year old male here with memory loss since 2010-2011. Family stress and depression and anxiety continue. Overall memory is stable. Neurodegenerative process is felt to be less likely at this time.   Ddx: MCI, pseudo-dementia of depression, sleep disturbance  Memory loss  Depression  OSA (obstructive sleep apnea)    PLAN: - consider IDEAS study - encouraged pt to start CPAP for OSA - continue follow up with psychiatry Seward Grater) re:  depression/anxiety  Return in about 1 year (around 11/11/2016).    Penni Bombard, MD [id number], 0000000 PM Certified in Neurology, Neurophysiology and Neuroimaging  [facility] [address], [place], [place] [address] [phone]

## 2015-11-12 NOTE — Patient Instructions (Addendum)
-   monitor symptoms - try to start CPAP for sleep apnea - continue exercise regimen

## 2016-04-11 HISTORY — PX: CATARACT EXTRACTION, BILATERAL: SHX1313

## 2016-11-14 ENCOUNTER — Encounter: Payer: Self-pay | Admitting: Diagnostic Neuroimaging

## 2016-11-14 ENCOUNTER — Encounter: Payer: Self-pay | Admitting: Psychology

## 2016-11-14 ENCOUNTER — Ambulatory Visit (INDEPENDENT_AMBULATORY_CARE_PROVIDER_SITE_OTHER): Payer: Medicare Other | Admitting: Diagnostic Neuroimaging

## 2016-11-14 VITALS — BP 127/82 | HR 61 | Wt 241.0 lb

## 2016-11-14 DIAGNOSIS — R413 Other amnesia: Secondary | ICD-10-CM | POA: Diagnosis not present

## 2016-11-14 DIAGNOSIS — G4733 Obstructive sleep apnea (adult) (pediatric): Secondary | ICD-10-CM

## 2016-11-14 DIAGNOSIS — F329 Major depressive disorder, single episode, unspecified: Secondary | ICD-10-CM | POA: Diagnosis not present

## 2016-11-14 DIAGNOSIS — F419 Anxiety disorder, unspecified: Secondary | ICD-10-CM | POA: Diagnosis not present

## 2016-11-14 DIAGNOSIS — F32A Depression, unspecified: Secondary | ICD-10-CM

## 2016-11-14 NOTE — Patient Instructions (Addendum)
-   I will setup neuropsychology testing (memory testing)  - follow up CPAP for sleep apnea  - follow up with psychiatry / psychology clinic

## 2016-11-14 NOTE — Progress Notes (Signed)
[facility]  PATIENT: David [name] DOB: [birth year]  REFERRING CLINICIAN: Rankins  HISTORY FROM: patient  REASON FOR VISIT: follow up   HISTORICAL  CHIEF COMPLAINT:  Chief Complaint  Patient presents with  . Memory Loss    rm 6, MMSE 28  . Follow-up    one year    HISTORY OF PRESENT ILLNESS:   UPDATE 11/14/16: Since last visit, memory loss worsening. Anxiety also worsening. Diff with tax returns. Diff remembering if he took his meds. More suicidal thoughts, but able to contract for safety. Seeing [facility] for psychiatry / psychology.  UPDATE 11/12/15: Since last visit, memory loss is stable. Had sleep study, now dx'd with OSA, and recommended to have CPAP (pt not started yet, due to finances). Still with sig family stress (wife, daughter, son).   UPDATE 05/13/15: Since last visit, symptoms continue, esp significant depression and stress related to breakup of his marriage. Noted some stuttering at a family event recently. Some more dizziness spells when standing up.   NEW HPI (02/16/15, VRP): Since last visit, has had continued significant psycho-social stress (separated from wife 2012, autistic daughter at home, son is living with alcoholic and raising a baby). Still with significant short term memory. Reports sleep apnea diagnosis many years ago (> 79yrs) and did not tolerate CPAP. Patient was a former Engineer, manufacturing systems, and has noted some mild smell loss.   PRIOR HPI (12/09/09, VRP): 71 year old right-handed male with history of anxiety, hypercholesterolemia and family history of Alzheimer's disease presenting for evaluation of mild memory difficulty. Over the past one year patient is noticed some mild difficulty with short-term memory. He gives examples of having mild difficulty with certain tasks and forgetting steps of sequence of events. He may be in one room, think of getting something from a different room, and while he is walking over to  the next room, he forgets what he wanted to get. He still remains physically and mentally active. 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  Has a long history of depression and anxiety and is being treated with combination of medications and therapy. He is quite aware of the limitations of treatment for prevention of Alzheimer's disease. However he is hoping to gain some insight into his likelihood of developing Alzheimer's disease so that he can better plan for the future in terms of his family his needs.   REVIEW OF SYSTEMS: Full 14 system review of systems performed and negative except:  tremors agitation confusion depression anxiety suicidal thoughts apnea snoring.   ALLERGIES: No Known Allergies  HOME MEDICATIONS: Outpatient Medications Prior to Visit  Medication Sig Dispense Refill  . hydrOXYzine (ATARAX/VISTARIL) 25 MG tablet Take 25 mg by mouth at bedtime.     . propranolol (INDERAL) 10 MG tablet Take 10 mg by mouth daily.    . sertraline (ZOLOFT) 100 MG tablet Take 200 mg by mouth daily.    . simvastatin (ZOCOR) 40 MG tablet Take 40 mg by mouth every evening.    . Tamsulosin HCl (FLOMAX) 0.4 MG CAPS Take 1 mg by mouth daily.    Marland Kitchen ALPRAZolam (XANAX) 1 MG tablet Take 1 mg by mouth at bedtime as needed. Pt taking up to 1mg  qd prn    . lamoTRIgine (LAMICTAL) 25 MG tablet Take 25 mg by mouth daily.     No facility-administered medications prior  to visit.     PAST MEDICAL HISTORY: Past Medical History:  Diagnosis Date  . Anxiety   . Depression   . Hyperlipidemia   . Hypertension   . Sleep apnea    CPAP    PAST SURGICAL HISTORY: Past Surgical History:  Procedure Laterality Date  . CATARACT EXTRACTION, BILATERAL  2018  . EXTERNAL EAR SURGERY     child  . EYE SURGERY Bilateral    cataract  . right knee meniscus  06/12/2008  . right talus repair     dislocated     FAMILY HISTORY: Family History  Problem Relation Age of Onset  . Alzheimer's disease Mother   . Cancer Father        prostate  . Thyroid disease Father   . Alzheimer's disease Maternal Grandmother   . Heart disease Maternal Grandfather   . Stroke Paternal Grandmother   . Stroke Paternal Grandfather   . Alcoholism Brother     SOCIAL HISTORY:  Social History   Social History  . Marital status: Legally Separated    Spouse name: David [name]  . Number of children: 2  . Years of education: 20   Occupational History  .       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 Smoking status: Never Smoker  . Smokeless tobacco: Never Used  . Alcohol use 1.2 oz/week    2 Glasses of wine per week     Comment: 1-2 shots Vodka daily  . Drug use: No     Comment: 02/16/15 marijuana rarely, 2018 occassionally  . Sexual activity: No   Other Topics Concern  . Not on file   Social History Narrative   Lives at home with wife   Caffeine use- 1-2 Cokes, tea daily     PHYSICAL EXAM  GENERAL EXAM/CONSTITUTIONAL: Vitals:  Vitals:   11/14/16 1341  BP: 127/82  Pulse: 61  Weight: 241 lb (109.3 kg)   Wt Readings from Last 3 Encounters:  11/14/16 241 lb (109.3 kg)  11/12/15 234 lb (106.1 kg)  05/25/15 234 lb (106.1 kg)    Body mass index is 32.69 kg/m. No exam data present  Patient is in no distress; well developed, nourished and groomed; neck is supple  CARDIOVASCULAR:  Examination of carotid arteries is normal; no carotid bruits  Regular rate and rhythm, no murmurs  Examination of peripheral vascular system by observation and palpation is normal   MUSCULOSKELETAL:  Gait, strength, tone, movements noted in Neurologic exam below  NEUROLOGIC: MENTAL STATUS:  MMSE - [place] Exam 11/14/2016 11/12/2015 05/13/2015  Orientation to time 4 5 5   Orientation to Place 5 5 5   Registration 3 3 3   Attention/ Calculation 5 5 5   Recall 2 3 3   Language- name 2 objects 2  2 2   Language- repeat 1 1 1   Language- follow 3 step command 3 3 3   Language- read & follow direction 1 1 1   Write a sentence 1 1 1   Copy design 1 1 1   Total score 28 30 30     awake, alert, oriented to person, place and time  recent and remote memory intact  normal attention and concentration  language fluent, comprehension intact, naming intact,   fund of knowledge appropriate  CRANIAL NERVE:   2nd, 3rd, 4th, 6th - pupils equal and reactive to light, visual fields full to confrontation, extraocular muscles intact, no nystagmus  5th - facial sensation symmetric  7th - facial strength  symmetric  8th - hearing intact  9th - palate elevates symmetrically, uvula midline  11th - shoulder shrug symmetric  12th - tongue protrusion midline  MOTOR:   normal bulk and tone, full strength in the BUE, BLE  POSTURAL TREMOR  SENSORY:   normal and symmetric to light touch, temperature, vibration  COORDINATION:   finger-nose-finger, fine finger movements normal  REFLEXES:   deep tendon reflexes SLIGHTLY BRISK and symmetric  GAIT/STATION:   narrow based gait; tandem stable; romberg is negative    DIAGNOSTIC DATA (LABS, IMAGING, TESTING) - I reviewed patient records, labs, notes, testing and imaging myself where available.  Lab Results  Component Value Date   HGB 15.3 03/17/2010   HCT 45.0 03/17/2010      Component Value Date/Time   NA 139 03/17/2010 1401   K 4.2 03/17/2010 1401   CL 106 03/17/2010 1401   GLUCOSE 117 (H) 03/17/2010 1401   BUN 17 03/17/2010 1401   CREATININE 1.1 03/17/2010 1401   No results found for: CHOL, HDL, LDLCALC, LDLDIRECT, TRIG, CHOLHDL No results found for: HGBA1C No results found for: VITAMINB12 No results found for: TSH   12/16/09 MRI brain  - normal   02/15/10 & 02/19/10 Neuropsychology testing ([name]) - low average - average memory abilities; unexpectedly low given general cognitive function was in superior range -  could be related to low B12 levels or high stress levels (anxiety) - possible very early indicator of neurodegenerative disorder    ASSESSMENT AND PLAN  71 y.o. year old male here with memory loss since 2010-2011. Family stress and depression and anxiety continue. Overall memory is stable. Neurodegenerative process is felt to be less likely at this time.   Ddx: MCI, pseudo-dementia of depression, sleep disturbance  Memory loss  Depression, unspecified depression type  Anxiety  OSA (obstructive sleep apnea)     PLAN:  I spent 40 minutes of face to face time with patient. Greater than 50% of time was spent in counseling and coordination of care with patient. In summary we discussed:   MEMORY LOSS - repeat neuropsychology testing (eval for pseudo-dementia vs MCI vs mild dementia) - will consider MCI/dementia study for patient (patient will ask his spouse (now separated) if she is able to help as care-partner)  ANXIETY/DEPRESSION - continue follow up with psychiatry and psychology Seward Grater; Pauline Good)  [place] - encouraged pt to start CPAP for OSA  Return in about 4 months (around 03/16/2017).    Penni Bombard, MD 0/06/4915, 9:15 PM Certified in Neurology, Neurophysiology and Neuroimaging  [facility] [address] [place], [place] [address] [phone]

## 2017-01-23 ENCOUNTER — Ambulatory Visit (INDEPENDENT_AMBULATORY_CARE_PROVIDER_SITE_OTHER): Payer: Medicare Other | Admitting: Psychology

## 2017-01-23 DIAGNOSIS — F419 Anxiety disorder, unspecified: Secondary | ICD-10-CM

## 2017-01-23 DIAGNOSIS — F329 Major depressive disorder, single episode, unspecified: Secondary | ICD-10-CM

## 2017-01-23 DIAGNOSIS — F32A Depression, unspecified: Secondary | ICD-10-CM

## 2017-01-23 DIAGNOSIS — R413 Other amnesia: Secondary | ICD-10-CM | POA: Diagnosis not present

## 2017-01-23 NOTE — Progress Notes (Signed)
   Neuropsychology Note  David [name] came in today for 2 hours of neuropsychological testing with technician, Milana Kidney, BS, under the supervision of Dr. Macarthur Critchley. The patient did not appear overtly distressed by the testing session, per behavioral observation or via self-report to the technician. Rest breaks were offered. David [name] will return within 2 weeks for a feedback session with Dr. Si Raider at which time his test performances, clinical impressions and treatment recommendations will be reviewed in detail. The patient understands he can contact our office should he require our assistance before this time.  Full report to follow.

## 2017-01-23 NOTE — Progress Notes (Signed)
NEUROPSYCHOLOGICAL INTERVIEW (CPT: [id number])  Name: David [name] Date of Birth: [birth year] Date of Interview: 01/23/2017  Reason for Referral:  David [name] is a 71 y.o. right handed male who is referred for neuropsychological evaluation by [name] of [facility] due to concerns about memory loss. This patient is accompanied in the office by his wife who supplements the history.  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 since 2016. He had also previously seen him in 2011 for memory concerns. He apparently had neuropsychological evaluation with [name] in 2011 but these records are not available for my review. Per [name] notes, this evaluation yielded low average to average memory abilities which was unexpectedly low given general cognitive function in the superior range. [name] reportedly felt this could be related to low B12 levels, high stress levels/anxiety, or possibly very early indicator of neurodegenerative disorder. At his most recent visit with [name] on 11/14/2016, the patient scored 28/30 on MMSE. [name] felt a neurodegenerative process is less likely given overall stable memory.  Today the patient reports cognitive symptoms started around at least 2010 or 2011 and have progressed over time. Upon direct questioning, the patient reported the following with regard to current cognitive functioning:  Forgetting recent conversations/events: Yes Repeating statements/questions: Yes (wife agrees) Misplacing/losing items: Yes Forgetting appointments or other obligations: Not if he looks at his calendar Forgetting to take medications: No, does fine for the most part Difficulty concentrating: Not if there are no distractions Starting but not finishing tasks: Yes Distracted easily: Yes  Word-finding difficulty: Yes  Writing difficulty: Yes, penmanship reduced with tremor Comprehension difficulty:  No, as long as there are no distractions. Getting lost when driving: Yes, this has happened  Uncertain about directions when driving or passenger: Yes, and this causes a lot of anxiety. He states he used to have a very keen sense of direction, but it has deteriorated over the years.  The patient and his wife have been separated since 2012. He lives alone. He is unemployed. He manages all complex ADLs independently but notes more difficulty with managing complex financial paperwork. He always filed his own tax returns but for the past two years he had to hire an accounting firm. He reports it was just too overwhelming and anxiety provoking and he couldn't get organized.  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 In the past he could not tolerate the CPAP. He has not tried again yet more recently. He reports ongoing sleep difficulties.  The patient is concerned about Alzheimer's disease because his mother has it and his maternal grandmother also had it. Of note, his wife tells me separately that she is not so sure his mother's dementia is due to AD, as she started having memory problems in the context of significant depression and alcohol abuse after her second husband passed away.  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 He reports he did outpatient therapy in the past as well. He denies history of inpatient psychiatric hospitalization. He reports that his mental health  issues "cost [him] his job" but does not elaborate. He frequently speaks of high levels of anxiety, but is unable to clearly tell me how long he  has been having issues with anxiety. His wife says it has not been a major issue until the last 2-3 years. The patient reports that he has depression that comes and goes. He hs been able to stick to an exercise regimen which is helping with mood (and resulted in 40 lb weight loss). He endorses suicidal ideation and plan but denies current suicidal intention. He reports his plan would be to do "what the natives do when a member of the tribe becomes to sick to care for themselves", and explains he would just go out into nature and let it run its course.  The patient reports that he has autism, but he has never actually been diagnosed. He thinks he has problems with reading social cues and in social communication. His 54 yo daughter does have autism. His wife does not think he has autism.  The patient's wife spoke to me privately from the patient to inform me of some additional relevant information. She reported that the patient had a "mental health breakdown" in 2006 when significant job stress put him over the edge. During that time, it apparently came to light that he had "a lifelong fetish" with women's undergarments. She had never known about this. He apparently was caught stealing undergarments from his neighbor's house which belonged to their teenaged daughter. He was charged with breaking and entering and has a felony for this. He completed probation and mandatory therapy. He lost is job due to this. His wife notes that they have also lost two agencies who care for their daughter with autism, because male employees reported they were uncomfortable with him. The patient does see a psychologist but his wife is unsure if it is helping him. He is followed by [facility] office for medication management. He is currently on several psychotropic medications. 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 His wife noted that at the time he broke into his neighor's house, he was on 8 different psychotropic medications.   Social History: Born/Raised: Born in [place], [place]. Raised in [place]. Educated in [place] and [place] Education: PhD in [employer] Occupational history: [employer], took early retirement when had "mental health breakdown".  Marital history: Married twice, legally separated from current wife. 2 children with current wife (103 yo daughter has autism with severe behavioral problems; son is in his 75s and has two children of his own) Alcohol: Not daily. Never more than 1-2. Prefers straight vodka on ice.  Tobacco: Never States he has consumed "lots of marijuana" over his lifetime. Only occasional MJ now, due to not having a reliable source.    Medical History: Past Medical History:  Diagnosis Date  . Anxiety   . Depression   . Hyperlipidemia   . Hypertension   . Sleep apnea    CPAP      Current Medications:  Buproprion 150 mg Hydroxyzine 25 mg Lamotrigine 100 mg Sertraline 100 mg Tamsulosin 0.4 mg Propanolol 10 mg  Alprazolam 0.5 mg (typically once a day) Simvastatin 40 mg  He is speaking with psychiatry provider about possibly stopping hydroxyzine, lamotrigine, and propanolol   Behavioral Observations:   Appearance: Casually and appropriately dressed and groomed. Bilateral hand tremor  observed. Gait: Ambulated independently, no gross abnormalities observed Speech: Fluent; normal rate, soft volume. Mild word finding difficulty. Some repetition of information throughout the interview. Thought process: Extremely tangential Affect: Full, anxious Interpersonal: Pleasant, appropriate but clearly no concept of going over on time   TESTING: There is medical necessity to proceed with neuropsychological assessment as the results will be used to aid in differential diagnosis and clinical decision-making and to inform specific  treatment recommendations. Per the patient, his wife and medical records reviewed, there has been a change in cognitive functioning. There is a need for objective testing of subjective cognitive complaints in order to determine neurologic versus psychiatric etiology of symptoms.  Following the clinical interview, the patient completed a full battery of neuropsychological testing with my psychometrician under my supervision.   PLAN: The patient will return to see me for a follow-up session at which time his test performances and my impressions and treatment recommendations will be reviewed in detail.  Full report to follow.

## 2017-01-24 ENCOUNTER — Encounter: Payer: Self-pay | Admitting: Psychology

## 2017-02-16 NOTE — Progress Notes (Signed)
NEUROPSYCHOLOGICAL EVALUATION   Name:    David [name]  Date of Birth:   1945-04-29 Date of Interview:  01/23/2017 Date of Testing:  01/23/2017   Date of Feedback:  02/20/2017       Background Information:  Reason for Referral:  David [name] is a 71 y.o. right handed male referred by [name] to assess his current level of cognitive functioning and assist in differential diagnosis. The current evaluation consisted of a review of available medical records, an interview with the patient and his wife, and the completion of a neuropsychological testing battery. Informed consent was obtained.  History of Presenting Problem:  David [name] has been followed by [name] since 2016. He had also previously seen him in 2011 for memory concerns. He apparently had neuropsychological evaluation with [name] in 2011 but these records are not available for my review. Per [name] notes, this evaluation yielded low average to average memory abilities which was unexpectedly low given general cognitive function in the superior range. [name] reportedly felt this could be related to low B12 levels, high stress levels/anxiety, or possibly very early indicator of neurodegenerative disorder. At his most recent visit with [name] on 11/14/2016, the patient scored 28/30 on MMSE. [name] felt a neurodegenerative process is less likely given overall stable memory.  Today the patient reports cognitive symptoms started around at least 2010 or 2011 and have progressed over time. Upon direct questioning, the patient reported the following with regard to current cognitive functioning:  Forgetting recent conversations/events: Yes Repeating statements/questions: Yes (wife agrees) Misplacing/losing items: Yes Forgetting appointments or other obligations: Not if he looks at his calendar Forgetting to take medications: No, does fine for the most part Difficulty concentrating: Not if  there are no distractions Starting but not finishing tasks: Yes Distracted easily: Yes  Word-finding difficulty: Yes  Writing difficulty: Yes, penmanship reduced with tremor Comprehension difficulty: No, as long as there are no distractions. Getting lost when driving: Yes, this has happened  Uncertain about directions when driving or passenger: Yes, and this causes a lot of anxiety. He states he used to have a very keen sense of direction, but it has deteriorated over the years.  The patient and his wife have been separated since 2012. He lives alone. He is unemployed. He manages all complex ADLs independently but notes more difficulty with managing complex financial paperwork. He always filed his own tax returns but for the past two years he had to hire an accounting firm. He reports it was just too overwhelming and anxiety provoking and he couldn't get organized.  David [name] has been diagnosed with OSA in the past and again more recently. In the past he could not tolerate the CPAP. He has not tried again yet more recently. He reports ongoing sleep difficulties.  The patient is concerned about Alzheimer's disease because his mother has it and his maternal grandmother also had it. Of note, his wife tells me separately that she is not so sure his mother's dementia is due to AD, as she started having memory problems in the context of significant depression and alcohol abuse after her second husband passed away.  David [name]' wife reported to me that the patient's cognitive difficulties and forgetfulness did not become an issue until 2 years ago when he found out she was dating and in a serious relationship with someone else and would be pursuing divorce (they had been legally separated for years).  Psychiatric History: David [name] reports that he has been treated by psychiatry providers for the past 12 years. He reports he did outpatient therapy in the past as well. He denies history of inpatient  psychiatric hospitalization. He reports that his mental health issues "cost [him] his job" but does not elaborate. He frequently speaks of high levels of anxiety, but is unable to clearly tell me how long he has been having issues with anxiety. His wife says it has not been a major issue until the last 2-3 years. The patient reports that he has depression that comes and goes. He hs been able to stick to an exercise regimen which is helping with mood (and resulted in 40 lb weight loss). He endorses suicidal ideation and plan but denies current suicidal intention. He reports his plan would be to do "what the natives do when a member of the tribe becomes to sick to care for themselves", and explains he would just go out into nature and let it run its course.  The patient reports that he has autism, but he has never actually been diagnosed. He thinks he has problems with reading social cues and in social communication. His 68 yo daughter does have autism. His wife does not think he has autism.  The patient's wife spoke to me privately from the patient to inform me of some additional relevant information. She reported that the patient had a "mental health breakdown" in 2006 when significant job stress put him over the edge. During that time, it apparently came to light that he had "a lifelong fetish" with women's undergarments. She had never known about this. He apparently was caught stealing undergarments from his neighbor's house which belonged to their teenaged daughter. He was charged with breaking and entering and has a felony for this. He completed probation and mandatory therapy. He lost is job due to this. His wife notes that they have also lost two agencies who care for their daughter with autism, because male employees reported they were uncomfortable with him. The patient does see a psychologist but his wife is unsure if it is helping him. He is followed by [facility] for medication  management. He is currently on several psychotropic medications. 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 His wife noted that at the time he broke into his neighor's house, he was on 8 different psychotropic medications.   Social History: Born/Raised: Born in [place], [place]. Raised in [place]. Educated in [place] and [place] Education: PhD in [employer] Occupational history: [employer], took early retirement when had "mental health breakdown".  Marital history: Married twice, legally separated from current wife. 2 children with current wife (39 yo daughter has autism with severe behavioral problems; son is in his 85s and has two children of his own) Alcohol: Not daily. Never more than 1-2. Prefers straight vodka on ice.  Tobacco: Never States he has used "lots of marijuana" over his lifetime. Only occasional MJ now, due to not having a reliable source.    Medical History:  Past Medical History:  Diagnosis Date  . Anxiety   . Depression   . Hyperlipidemia   . Hypertension   . Sleep apnea    CPAP    Current medications:  Buproprion 150 mg Hydroxyzine 25 mg Lamotrigine 100 mg Sertraline 100 mg Tamsulosin 0.4 mg Propanolol 10 mg  Alprazolam 0.5 mg (typically once a day) Simvastatin 40 mg  He is speaking with psychiatry provider about possibly stopping hydroxyzine, lamotrigine, and propanolol  Current Examination:  Behavioral Observations:  Appearance: Casually and appropriately dressed and groomed. Bilateral hand tremor observed. Gait: Ambulated independently, no gross abnormalities observed Speech: Fluent; normal rate, soft volume. Mild word finding difficulty. Some repetition of information throughout the interview. Thought process: Extremely tangential Affect: Full, anxious Interpersonal: Pleasant, appropriate but clearly no  concept of going over on time Orientation: Oriented to all spheres. Accurately named the current President and his predecessor.   Tests Administered: . Test of Premorbid Functioning (TOPF) . Wechsler Adult Intelligence Scale-Fourth Edition (WAIS-IV): Similarities, Information, Block Design, Matrix Reasoning, Arithmetic, Symbol Search, Coding and Digit Span subtests . Wechsler Memory Scale-Fourth Edition (WMS-IV) Older Adult Version (ages 30-90): Logical Memory I, II and Recognition subtests  . [place] Verbal Learning Test - 2nd Edition (CVLT-2) Standard Form . Repeatable Battery for the Assessment of Neuropsychological Status (RBANS) Form A:  Figure Copy and Figure Recall subtests and Semantic Fluency subtest . Boston Naming Test (BNT) . Controlled Oral Word Association Test (COWAT) . Trail Making Test A and B . Boston Diagnostic Aphasia Examination (BDAE): Complex Ideational Material Subtest . Clock Drawing Test . Beck Depression Inventory - Second edition (BDI-II) . Generalized Anxiety Disorder 7 item screener (GAD-7)  Test Results: Note: Standardized scores are presented only for use by appropriately trained professionals and to allow for any future test-retest comparison. These scores should not be interpreted without consideration of all the information that is contained in the rest of the report. The most recent standardization samples from the test publisher or other sources were used whenever possible to derive standard scores; scores were corrected for age, gender, ethnicity and education when available.   Also note: The following test performances may not provide a completely valid estimate of the patient's current neuropsychological functioning as there was evidence of variable effort to engage in the cognitive tests. True abilities are thought to be of at least the level reported here and deficits cannot be assumed to be genuine.  Test Scores:  Test Name Raw Score Standardized  Score Descriptor  TOPF 64/70 SS= 122 Superior  WAIS-IV Subtests     Similarities 30/36 ss= 14 Superior  Information 21/26 ss= 13 High average  Block Design 40/66 ss= 13 High average  Matrix Reasoning 13/26 ss= 11 Average  Arithmetic 18/22 ss= 14 Superior  Symbol Search 31/60 ss= 13 High average  Coding 77/135 ss= 15 Superior  Digit Span 27/48 ss= 11 Average  WAIS-IV Index Scores     Verbal Comprehension  SS= 120 Superior  Perceptual Reasoning  SS= 111 High average  Working Memory  SS= 114 High average  Processing Speed  SS= 122 Superior  Full Scale IQ (8 subtest)  SS= 121 Superior  WMS-IV Subtests     LM I 22/53 ss= 7 Low average  LM II 4/39 ss= 4 Impaired   LM II Recognition 14/23 Cum %: 3-9 Impaired  CVLT-II Scores     Trial 1 3/16 Z= -1.5 Borderline  Trial 5 9/16 Z= -0.5 Average  Trials 1-5 total 28/80 T= 39 Low average  SD Free Recall 3/16 Z= -1.5 Borderline  SD Cued Recall 6/16 Z= -1.5 Borderline  LD Free Recall 2/16 Z= -2 Impaired  LD Cued Recall 7/16 Z= -0.5 Average  Recognition Discriminability 15/16 hits; 23 false positives Z= -2 Impaired  Forced Choice Recognition 14/16  Abnormal  RBANS  Subtests     Figure Copy 20/20 Z= 1.2 High average  Figure Recall 3/20 Z= -2.3 Impaired  Semantic Fluency 16 Z= -0.7 Average  BNT 59/60 T= 60 High average  COWAT-FAS 75 T= 77 Very superior  COWAT-Animals 23 T= 61 High average  Trail Making Test A  33" 0 errors T= 56 Average   Trail Making Test B  74" 0 errors T= 55 Average  BDAE Subtest     Complex Ideational Material 12/12  WNL  Clock Drawing Test   WNL  BDI-II 42/63  Severe  GAD-7 18/21  Severe     Description of Test Results:  Embedded performance validity indicators revealed variable levels of effort and attention. As such, the patient's current performance on neurocognitive testing may not accurately reflect his true cognitive abilities, and results of neuropsychological testing must be interpreted with caution.    Premorbid verbal intellectual abilities were estimated to have been within the superior range based on a test of word reading. Commensurate with this, current full-scale IQ fell within the superior range.  Psychomotor processing speed was superior. Auditory attention and working memory were high average. Visual-spatial construction was high average. Language abilities were normal. Specifically, confrontation naming was high average, and semantic verbal fluency was average to high average. Auditory comprehension of complex ideational material was intact. With regard to verbal memory, encoding and acquisition of non-contextual information (i.e., word list) was low average. After a brief interference task, free recall was borderline (3/16 items). With semantic cueing, he recalled an additional 3 items (borderline). After a delay, free recall was impaired (2/16 items). Cued recall was average (7/16 items). Across all recall trials, he demonstrated a very elevated number of intrusion errors. Performance on a yes/no recognition task was severely impaired due to very high number of false positive errors. On a forced choice recognition task, his performance was highly abnormal and indicated the possibility of sub-optimal effort. On another verbal memory test, encoding and acquisition of contextual auditory information (i.e., short stories) was low average. After a delay, free recall was impaired. Performance on a yes/no recognition task was impaired. With regard to non-verbal memory, delayed free recall of visual information was impaired. Performances on tests measuring various aspects of executive functioning were intact. Mental flexibility and set-shifting were average on Trails B. Verbal fluency with phonemic search restrictions was very superior. Verbal abstract reasoning was superior. Non-verbal abstract reasoning was average. Performance on a clock drawing task was normal.   On self-report measures of mood,  the patient's responses were indicative of clinically significant, severe depression and anxiety at the present time. He endorsed suicidal ideation with a wish to kill himself. He denied active intention (denied plan to do so if he has the chance).    Clinical Impressions: Major depressive disorder, severe; Generalized anxiety disorder.  Results of cognitive testing reveal very high intellectual abilities and very high cognitive function in processing speed, attention, visual spatial construction, language and executive functioning. He demonstrated focal weakness/impairment on tests measuring his ability to learn and remember new information (both auditory and verbal), but he also demonstrated poor performance on an embedded measure of effort on memory testing, raising the concern for suboptimal effort on memory testing in particular.  He is also reporting severe depression and anxiety with suicidal ideation.  The possibility of suboptimal effort on memory testing makes it difficult to determine the presence of an underlying neurodegenerative disorder such as Alzheimer's disease, but based on the information available to me (  including his current daily functioning, his level of depression/anxiety, and his overall cognitive profile), I think it is much more likely that his cognitive complaints are secondary to primary psychiatric disorders and/or polypharmacy related to multiple medications for these illnesses, as well as high level of psychosocial stress. While focal impairment on tests of episodic memory is often concerning for early stage Alzheimer's disease, there is the question of effort and also the fact that he has not declined in any other areas of cognitive function since his last evaluation seven years ago. The latter is very reassuring.    Recommendations/Plan: Based on the findings of the present evaluation, the following recommendations are offered:  1. Continue mental health treatment. I  recommended more intensive psychotherapy given suicidal ideation, severe depression and severe anxiety. He accepted a referral to [facility] T[facility] or [facility] Hocking). 2. CPAP for sleep apnea. I discussed the research showing reversibility of white matter changes and cognitive decline after proper treatment of sleep apnea in older adults. He will be getting fitted for a CPAP in the near future. 3. Strategies to maintain brain health were reviewed, including regular cardiovascular exercise, mental stimulation and social interaction. We also discussed the importance of balanced diet.   Feedback to Patient: David [name] and his wife returned for a feedback appointment on 02/20/2017 to review the results of his neuropsychological evaluation with this provider. 40 minutes face-to-face time was spent reviewing his test results, my impressions and my recommendations as detailed above.   Per his written authorization and request, a copy of this report is being sent to his PCP, psychiatry provider and current psychotherapist. A summary of this report is also being mailed to the patient, per his request.   Total time spent on this patient's case: [phone] unit for interview with psychologist; [phone] units of testing by psychometrician under psychologist's supervision; [phone] units for medical record review, scoring of neuropsychological tests, interpretation of test results, preparation of this report, and review of results to the patient by psychologist.      Thank you for your referral of David [name]. Please feel free to contact me if you have any questions or concerns regarding this report.

## 2017-02-20 ENCOUNTER — Encounter: Payer: Self-pay | Admitting: Psychology

## 2017-02-20 ENCOUNTER — Ambulatory Visit (INDEPENDENT_AMBULATORY_CARE_PROVIDER_SITE_OTHER): Payer: Medicare Other | Admitting: Psychology

## 2017-02-20 DIAGNOSIS — F419 Anxiety disorder, unspecified: Secondary | ICD-10-CM

## 2017-02-20 DIAGNOSIS — R413 Other amnesia: Secondary | ICD-10-CM | POA: Diagnosis not present

## 2017-02-20 DIAGNOSIS — F332 Major depressive disorder, recurrent severe without psychotic features: Secondary | ICD-10-CM

## 2017-02-20 NOTE — Patient Instructions (Signed)
Tests Administered:  Test of Premorbid Functioning (TOPF)  Wechsler Adult Intelligence Scale-Fourth Edition (WAIS-IV): Similarities, Information, Music therapist, Matrix Reasoning, Arithmetic, Symbol Search, Coding and Digit Span subtests  Wechsler Memory Scale-Fourth Edition (WMS-IV) Older Adult Version (ages 73-90): Logical Memory I, II and Recognition subtests   [place]consin Verbal Learning Test - 2nd Edition (CVLT-2) Standard Form  Repeatable Battery for the Assessment of Neuropsychological Status (RBANS) Form A:  Figure Copy and Figure Recall subtests and Semantic Fluency subtest  [employer] Test (BNT)  Controlled Oral Word Association Test (COWAT)  Trail Making Test A and B  Boston Diagnostic Aphasia Examination (BDAE): Complex Ideational Material Subtest  Clock Drawing Test  Beck Depression Inventory - Second edition (BDI-II)  Generalized Anxiety Disorder 7 item screener (GAD-7)   Description of Test Results:  Premorbid verbal intellectual abilities were estimated to have been within the superior range based on a test of word reading. Commensurate with this, current full-scale IQ fell within the superior range.  Psychomotor processing speed was superior. Auditory attention and working memory were high average. Visual-spatial construction was high average. Language abilities were normal. Specifically, confrontation naming was high average, and semantic verbal fluency was average to high average. Auditory comprehension of complex ideational material was intact. With regard to verbal memory, encoding and acquisition of non-contextual information (i.e., word list) was low average. After a brief interference task, free recall was borderline (3/16 items). With semantic cueing, he recalled an additional 3 items (borderline). After a delay, free recall was impaired (2/16 items). Cued recall was average (7/16 items). Across all recall trials, he demonstrated a very elevated number of  intrusion errors. Performance on a yes/no recognition task was severely impaired due to very high number of false positive errors. On a forced choice recognition task, his performance was highly abnormal and indicated the possibility of sub-optimal effort/psychological interference. On another verbal memory test, encoding and acquisition of contextual auditory information (i.e., short stories) was low average. After a delay, free recall was impaired. Performance on a yes/no recognition task was impaired. With regard to non-verbal memory, delayed free recall of visual information was impaired. Performances on tests measuring various aspects of executive functioning were intact. Mental flexibility and set-shifting were average on Trails B. Verbal fluency with phonemic search restrictions was very superior. Verbal abstract reasoning was superior. Non-verbal abstract reasoning was average. Performance on a clock drawing task was normal.   On self-report measures of mood, the patient's responses were indicative of clinically significant, severe depression and anxiety at the present time. He endorsed suicidal ideation with a wish to kill himself. He denied active intention (denied plan to do so if he has the chance).   Clinical Impressions: Major depressive disorder, severe; Generalized anxiety disorder.  Results of cognitive testing reveal very high intellectual abilities and very high cognitive function in processing speed, attention, visual spatial construction, language and executive functioning. He demonstrated focal weakness/impairment on tests measuring his ability to learn and remember new information (both auditory and verbal), but he also demonstrated poor performance on an embedded measure of effort on memory testing, raising the concern for suboptimal effort/psychological interference on memory testing in particular.  He is also reporting severe depression and anxiety with suicidal ideation.  Based on  the information available to me (including his current daily functioning, his level of depression/anxiety, and his overall cognitive profile), it is most likely that his cognitive complaints are secondary to primary psychiatric disorders and/or polypharmacy related to multiple medications for these illnesses,  as well as high level of psychosocial stress. While focal impairment on tests of episodic memory is often concerning for early stage Alzheimer's disease, the fact that he has not declined in any other areas of cognitive function since his last evaluation seven years ago is very reassuring.  Recommendations/Plan: Based on the findings of the present evaluation, the following recommendations are offered:  1. Continue mental health treatment. I recommended more intensive psychotherapy given suicidal ideation, severe depression and severe anxiety. He accepted a referral to [facility] Paula or [facility] Hocking). 2. CPAP for sleep apnea. I discussed the research showing reversibility of white matter changes and cognitive decline after proper treatment of sleep apnea in older adults. He will be getting fitted for a CPAP in the near future. 3. Strategies to maintain brain health were reviewed, including regular cardiovascular exercise, mental stimulation and social interaction. We also discussed the importance of balanced diet.

## 2017-03-17 ENCOUNTER — Ambulatory Visit: Payer: Medicare Other | Admitting: Diagnostic Neuroimaging

## 2017-03-17 ENCOUNTER — Telehealth: Payer: Self-pay | Admitting: *Deleted

## 2017-03-17 NOTE — Telephone Encounter (Signed)
Patient was no show for follow up today. 

## 2017-03-22 ENCOUNTER — Encounter: Payer: Self-pay | Admitting: Diagnostic Neuroimaging

## 2017-04-18 ENCOUNTER — Ambulatory Visit (INDEPENDENT_AMBULATORY_CARE_PROVIDER_SITE_OTHER): Payer: Medicare Other | Admitting: Psychology

## 2017-04-18 DIAGNOSIS — F331 Major depressive disorder, recurrent, moderate: Secondary | ICD-10-CM

## 2017-05-04 ENCOUNTER — Ambulatory Visit: Payer: Self-pay | Admitting: Psychology

## 2017-05-16 ENCOUNTER — Ambulatory Visit (INDEPENDENT_AMBULATORY_CARE_PROVIDER_SITE_OTHER): Payer: Medicare Other | Admitting: Psychology

## 2017-05-16 DIAGNOSIS — F331 Major depressive disorder, recurrent, moderate: Secondary | ICD-10-CM

## 2017-06-22 ENCOUNTER — Ambulatory Visit (INDEPENDENT_AMBULATORY_CARE_PROVIDER_SITE_OTHER): Payer: Medicare Other | Admitting: Psychology

## 2017-06-22 DIAGNOSIS — F331 Major depressive disorder, recurrent, moderate: Secondary | ICD-10-CM

## 2017-07-03 ENCOUNTER — Ambulatory Visit: Payer: Medicare Other | Admitting: Psychology

## 2017-07-03 DIAGNOSIS — F331 Major depressive disorder, recurrent, moderate: Secondary | ICD-10-CM | POA: Diagnosis not present

## 2017-07-17 ENCOUNTER — Ambulatory Visit (INDEPENDENT_AMBULATORY_CARE_PROVIDER_SITE_OTHER): Payer: Medicare Other | Admitting: Psychology

## 2017-07-17 DIAGNOSIS — F331 Major depressive disorder, recurrent, moderate: Secondary | ICD-10-CM | POA: Diagnosis not present

## 2017-08-07 ENCOUNTER — Ambulatory Visit (INDEPENDENT_AMBULATORY_CARE_PROVIDER_SITE_OTHER): Payer: Medicare Other | Admitting: Psychology

## 2017-08-07 DIAGNOSIS — F331 Major depressive disorder, recurrent, moderate: Secondary | ICD-10-CM

## 2017-08-22 ENCOUNTER — Ambulatory Visit (INDEPENDENT_AMBULATORY_CARE_PROVIDER_SITE_OTHER): Payer: Medicare Other | Admitting: Psychology

## 2017-08-22 DIAGNOSIS — F331 Major depressive disorder, recurrent, moderate: Secondary | ICD-10-CM | POA: Diagnosis not present

## 2017-09-18 ENCOUNTER — Ambulatory Visit (INDEPENDENT_AMBULATORY_CARE_PROVIDER_SITE_OTHER): Payer: Medicare Other | Admitting: Psychology

## 2017-09-18 DIAGNOSIS — F331 Major depressive disorder, recurrent, moderate: Secondary | ICD-10-CM

## 2017-10-02 ENCOUNTER — Ambulatory Visit (INDEPENDENT_AMBULATORY_CARE_PROVIDER_SITE_OTHER): Payer: Medicare Other | Admitting: Psychology

## 2017-10-02 DIAGNOSIS — F331 Major depressive disorder, recurrent, moderate: Secondary | ICD-10-CM

## 2017-10-17 ENCOUNTER — Ambulatory Visit: Payer: Medicare Other | Admitting: Psychology

## 2017-10-17 DIAGNOSIS — F331 Major depressive disorder, recurrent, moderate: Secondary | ICD-10-CM | POA: Diagnosis not present

## 2017-10-27 ENCOUNTER — Other Ambulatory Visit: Payer: Self-pay

## 2017-10-27 ENCOUNTER — Emergency Department (HOSPITAL_COMMUNITY): Payer: Medicare Other

## 2017-10-27 ENCOUNTER — Ambulatory Visit (HOSPITAL_COMMUNITY)
Admission: RE | Admit: 2017-10-27 | Discharge: 2017-10-27 | Disposition: A | Discharge status: Home / Self Care | Payer: Medicare Other | Source: Home / Self Care | Attending: Psychiatry | Admitting: Psychiatry

## 2017-10-27 ENCOUNTER — Encounter (HOSPITAL_COMMUNITY): Payer: Self-pay

## 2017-10-27 ENCOUNTER — Inpatient Hospital Stay (HOSPITAL_COMMUNITY): Admission: AD | Admit: 2017-10-27 | Payer: Medicare Other | Source: Intra-hospital | Admitting: Psychiatry

## 2017-10-27 ENCOUNTER — Emergency Department (HOSPITAL_COMMUNITY)
Admission: EM | Admit: 2017-10-27 | Discharge: 2017-10-29 | Disposition: A | Discharge status: Home / Self Care | Payer: Medicare Other | Attending: Emergency Medicine | Admitting: Emergency Medicine

## 2017-10-27 DIAGNOSIS — F411 Generalized anxiety disorder: Secondary | ICD-10-CM

## 2017-10-27 DIAGNOSIS — F1910 Other psychoactive substance abuse, uncomplicated: Secondary | ICD-10-CM | POA: Insufficient documentation

## 2017-10-27 DIAGNOSIS — R45851 Suicidal ideations: Secondary | ICD-10-CM

## 2017-10-27 DIAGNOSIS — F32A Depression, unspecified: Secondary | ICD-10-CM

## 2017-10-27 DIAGNOSIS — F332 Major depressive disorder, recurrent severe without psychotic features: Secondary | ICD-10-CM | POA: Diagnosis present

## 2017-10-27 DIAGNOSIS — F329 Major depressive disorder, single episode, unspecified: Secondary | ICD-10-CM | POA: Diagnosis present

## 2017-10-27 DIAGNOSIS — Z79899 Other long term (current) drug therapy: Secondary | ICD-10-CM | POA: Diagnosis not present

## 2017-10-27 DIAGNOSIS — R251 Tremor, unspecified: Secondary | ICD-10-CM | POA: Diagnosis not present

## 2017-10-27 DIAGNOSIS — R55 Syncope and collapse: Secondary | ICD-10-CM

## 2017-10-27 DIAGNOSIS — R410 Disorientation, unspecified: Secondary | ICD-10-CM | POA: Insufficient documentation

## 2017-10-27 LAB — TSH: TSH: 1.624 u[IU]/mL (ref 0.350–4.500)

## 2017-10-27 LAB — COMPREHENSIVE METABOLIC PANEL
ALK PHOS: 40 U/L (ref 38–126)
ALT: 18 U/L (ref 0–44)
AST: 20 U/L (ref 15–41)
Albumin: 4.2 g/dL (ref 3.5–5.0)
Anion gap: 9 (ref 5–15)
BUN: 16 mg/dL (ref 8–23)
CALCIUM: 9.4 mg/dL (ref 8.9–10.3)
CO2: 24 mmol/L (ref 22–32)
Chloride: 110 mmol/L (ref 98–111)
Creatinine, Ser: 1.19 mg/dL (ref 0.61–1.24)
GFR calc non Af Amer: 60 mL/min — ABNORMAL LOW (ref >60)
Glucose, Bld: 85 mg/dL (ref 70–99)
POTASSIUM: 4 mmol/L (ref 3.5–5.1)
SODIUM: 143 mmol/L (ref 135–145)
TOTAL PROTEIN: 6.7 g/dL (ref 6.5–8.1)
Total Bilirubin: 1.3 mg/dL — ABNORMAL HIGH (ref 0.3–1.2)

## 2017-10-27 LAB — VITAMIN B12: VITAMIN B 12: 157 pg/mL — AB (ref 180–914)

## 2017-10-27 LAB — SALICYLATE LEVEL

## 2017-10-27 LAB — CBC
HCT: 45.8 % (ref 39.0–52.0)
HEMOGLOBIN: 15.8 g/dL (ref 13.0–17.0)
MCH: 31.8 pg (ref 26.0–34.0)
MCHC: 34.5 g/dL (ref 30.0–36.0)
MCV: 92.2 fL (ref 78.0–100.0)
Platelets: 191 10*3/uL (ref 150–400)
RBC: 4.97 MIL/uL (ref 4.22–5.81)
RDW: 13.1 % (ref 11.5–15.5)
WBC: 8.3 10*3/uL (ref 4.0–10.5)

## 2017-10-27 LAB — RAPID URINE DRUG SCREEN, HOSP PERFORMED
AMPHETAMINES: NOT DETECTED
BARBITURATES: NOT DETECTED
Benzodiazepines: POSITIVE — AB
Cocaine: NOT DETECTED
Opiates: NOT DETECTED
TETRAHYDROCANNABINOL: NOT DETECTED

## 2017-10-27 LAB — TROPONIN I

## 2017-10-27 LAB — ETHANOL: Alcohol, Ethyl (B): <10 mg/dL (ref <10)

## 2017-10-27 LAB — ACETAMINOPHEN LEVEL: Acetaminophen (Tylenol), Serum: <10 ug/mL — ABNORMAL LOW (ref 10–30)

## 2017-10-27 MED ORDER — SERTRALINE HCL 50 MG PO TABS: 200.0000 mg | ORAL_TABLET | Freq: Every day | ORAL | Status: DC

## 2017-10-27 MED ORDER — SIMVASTATIN 40 MG PO TABS: 40.0000 mg | ORAL_TABLET | Freq: Every evening | ORAL | Status: DC

## 2017-10-27 MED ORDER — LORAZEPAM 2 MG/ML IJ SOLN
1.0000 mg | INTRAMUSCULAR | Status: DC | PRN
  Administered 2017-10-27: 1 mg via INTRAMUSCULAR

## 2017-10-27 MED ORDER — LORAZEPAM 2 MG/ML IJ SOLN
1.0000 mg | INTRAMUSCULAR | Status: DC | PRN
  Filled 2017-10-27: qty 1

## 2017-10-27 NOTE — ED Triage Notes (Signed)
Patient states he is having SI. Patient states his plan is to go [place] by himself and let nature take it's course. That is what the native Americans used to do." Patient denies HI. Patient states he is "possibly seeing images."  Patient states he has consumed a small amount of marijuana in the past 6 months, but very little in the past few weeks. Patient states he has a shot of Vodka 2-3 times a week.

## 2017-10-27 NOTE — ED Notes (Signed)
2 Gold tops drawn and sent to lab

## 2017-10-27 NOTE — ED Notes (Signed)
Lab states that they need one GOLD top.

## 2017-10-27 NOTE — ED Notes (Signed)
Bed: WA26 Expected date:  Expected time:  Means of arrival:  Comments: 

## 2017-10-27 NOTE — BH Assessment (Addendum)
Assessment Note  David [name] is an 72 y.o. male present to [facility] as a walk-in with complaints of suicidal ideations and depressive symptoms of feeling worthlessness, tearful, isolates and irritability. Patient report a history of Depression and Anxiety. Report in the past few months he's been forgetful and having thoughts of waiting to die triggered by losing his memory. Patient took a memory test Aug. 20118 where he scored 29/30 of having a good memory. Patient's wife who was present during the assessment expressed patient is having memory problems as a result of anxiety, stress and depression. Patient report wanting to die, 'end his life.' Report plan to without without notifying anyone. Go to the wilderness and allow nature to take it course like the [place] do. Patient denies previous suicidal intent only ideations. Denies homicidal ideations, denies auditory / visual hallucinations.   Patient present with a flat depressed affect. Patient tearful and scared of losing his memory. Patient report dementia runs in his family. Patient 76 year old mother is suffering with dementia presently living in an assisted living facility. Patient had a relative commit suicide last year. Patient speech was logical as well a rate of speech. Patient judgement impaired as evidenced by suicidal thoughts and depressive symptoms. Patient unable to contract for safety.   Diagnosis:   Past Medical History:  Past Medical History:  Diagnosis Date  . Anxiety   . Depression   . Hyperlipidemia   . Hypertension   . Sleep apnea    CPAP    Past Surgical History:  Procedure Laterality Date  . CATARACT EXTRACTION, BILATERAL  2018  . EXTERNAL EAR SURGERY     child  . EYE SURGERY Bilateral    cataract  . right knee meniscus  06/12/2008  . right talus repair     dislocated    Family History:  Family History  Problem Relation Age of Onset  . Alzheimer's disease Mother   . Cancer Father        prostate  . Thyroid  disease Father   . Alzheimer's disease Maternal Grandmother   . Heart disease Maternal Grandfather   . Stroke Paternal Grandmother   . Stroke Paternal Grandfather   . Alcoholism Brother     Social History:  reports that he has never smoked. He has never used smokeless tobacco. He reports that he drinks alcohol. He reports that he has current or past drug history. Drug: Marijuana.  Additional Social History:  Alcohol / Drug Use Pain Medications: see MAR Prescriptions: see MAR Over the Counter: see MAR History of alcohol / drug use?: No history of alcohol / drug abuse  CIWA:   COWS:    Allergies: No Known Allergies  Home Medications:  (Not in a hospital admission)  OB/GYN Status:  No LMP for male patient.  General Assessment Data Location of Assessment: [facility](walk[facility]in) TTS Assessment: In system Is this a Tele or Face-to-Face Assessment?: Face-to-Face Is this an Initial Assessment or a Re-assessment for this encounter?: Initial Assessment Can pt return to current living arrangement?: Yes Admission Status: Voluntary Is patient capable of signing voluntary admission?: Yes Referral Source: Self/Family/Friend Insurance type: Dietitian Exam ([place]) Medical Exam completed: Yes  Crisis Care Plan Legal Guardian: Other:(self )  Education Status Is patient currently in school?: No Is the patient employed, unemployed or receiving disability?: (Retired )     Risk to Others within the past 6 months Homicidal Ideation: No Does patient have any lifetime risk of  violence toward others beyond the six months prior to admission? : No Thoughts of Harm to Others: No Current Homicidal Intent: No Current Homicidal Plan: No Access to Homicidal Means: No Identified Victim: n/a History of harm to others?: No Assessment of Violence: None Noted Violent Behavior Description: None noted Does patient have access to weapons?:  No Criminal Charges Pending?: No Does patient have a court date: No Is patient on probation?: No  Psychosis Hallucinations: None noted Delusions: None noted  Mental Status Report Motor Activity: Freedom of movement Speech: Logical/coherent Level of Consciousness: Alert Anxiety Level: Minimal Orientation: Person, Place, Time, Situation Obsessive Compulsive Thoughts/Behaviors: None  Cognitive Functioning Is patient IDD: No Is patient DD?: No Insight: Fair Impulse Control: Fair Appetite: Fair Have you had any weight changes? : No Change  ADLScreening [facility] Assessment Services) Patient's cognitive ability adequate to safely complete daily activities?: Yes Patient able to express need for assistance with ADLs?: Yes Independently performs ADLs?: Yes (appropriate for developmental age)        ADL Screening (condition at time of admission) Patient's cognitive ability adequate to safely complete daily activities?: Yes Is the patient deaf or have difficulty hearing?: No Does the patient have difficulty seeing, even when wearing glasses/contacts?: No Does the patient have difficulty concentrating, remembering, or making decisions?: No Patient able to express need for assistance with ADLs?: Yes Does the patient have difficulty dressing or bathing?: No Independently performs ADLs?: Yes (appropriate for developmental age) Does the patient have difficulty walking or climbing stairs?: No       Abuse/Neglect Assessment (Assessment to be complete while patient is alone) Abuse/Neglect Assessment Can Be Completed: Yes Physical Abuse: Denies Verbal Abuse: Denies Sexual Abuse: Denies Exploitation of patient/patient's resources: Denies Self-Neglect: Denies     Regulatory affairs officer (For Healthcare) Does Patient Have a Medical Advance Directive?: No Would patient like information on creating a medical advance directive?: No - Patient declined    Additional Information 1:1 In Past 12  Months?: No CIRT Risk: No Elopement Risk: No Does patient have medical clearance?: Yes     Disposition:     On Site Evaluation by:   Reviewed with Physician:    Despina Hidden 10/27/2017 4:09 PM

## 2017-10-27 NOTE — ED Provider Notes (Addendum)
[place] DEPT Provider Note   CSN: [id number] Arrival date & time: 10/27/17  1731     History   Chief Complaint Chief Complaint  Patient presents with  . Medical Clearance    HPI David [name] is a 72 y.o. male.  HPI Patient presents From behavioral health for medical clearance.  He has a history of depression and has been more depressed recently.  He wants to go out into the woods because he is no longer good for his side.  States it is with a needs used to do.  Has had some worsening confusion also recently.  Had some difficulty remembering things when he was at his accountants.  Thought something was going on which was not.  Also has had apparently worsening tremors in his hands.  More difficulty writing now.  No headaches.  He has previously been worked up by neurology for similar symptoms.  Has been getting worse recently.  Sent by behavioral health for medical work-up but will likely need psychiatric placement otherwise.  Denies substance abuse will occasionally drink some alcohol.  No chest pain.  No trouble breathing.  Has lost 20 pounds but states he was trying to do this.  He is a former Music therapist.  He is accompanied by his ex-wife.  Patient states he is unsure if he has had any hallucinations.  Had been on Wellbutrin but is been tapering off it over the last 3 weeks. Past Medical History:  Diagnosis Date  . Anxiety   . Depression   . Hyperlipidemia   . Hypertension   . Sleep apnea    CPAP    There are no active problems to display for this patient.   Past Surgical History:  Procedure Laterality Date  . CATARACT EXTRACTION, BILATERAL  2018  . EXTERNAL EAR SURGERY     child  . EYE SURGERY Bilateral    cataract  . right knee meniscus  06/12/2008  . right talus repair     dislocated        Home Medications    Prior to Admission medications   Medication Sig Start Date End Date Taking? Authorizing Provider  ALPRAZolam  Duanne Moron) 0.5 MG tablet Take 0.5 mg by mouth 2 (two) times daily as needed for anxiety. At bedtime as needed 11/05/16  Yes [provider]  sertraline (ZOLOFT) 100 MG tablet Take 200 mg by mouth daily.   Yes [provider]  simvastatin (ZOCOR) 40 MG tablet Take 40 mg by mouth every evening.   Yes [provider]  Tamsulosin HCl (FLOMAX) 0.4 MG CAPS Take 1 mg by mouth daily.   Yes [provider]  hydrOXYzine (ATARAX/VISTARIL) 25 MG tablet Take 25 mg by mouth at bedtime.     [provider]    Family History Family History  Problem Relation Age of Onset  . Alzheimer's disease Mother   . Cancer Father        prostate  . Thyroid disease Father   . Alzheimer's disease Maternal Grandmother   . Heart disease Maternal Grandfather   . Stroke Paternal Grandmother   . Stroke Paternal Grandfather   . Alcoholism Brother     Social History Social History   Tobacco Use  . Smoking status: Never Smoker  . Smokeless tobacco: Never Used  Substance Use Topics  . Alcohol use: Yes    Comment: Vodka occasionally, not daily  . Drug use: Yes    Types: Marijuana  Comment: Occasional     Allergies   Lamictal [lamotrigine] and Wellbutrin [bupropion]   Review of Systems Review of Systems  Constitutional: Positive for appetite change.  Respiratory: Negative for shortness of breath.   Gastrointestinal: Negative for abdominal pain.  Genitourinary: Negative for flank pain.  Musculoskeletal: Negative for back pain.  Skin: Negative for rash.  Neurological: Positive for tremors.  Psychiatric/Behavioral: Positive for confusion.     Physical Exam Updated Vital Signs BP (!) 158/87 (BP Location: Left Arm)   Pulse 86   Temp (!) 97.3 F (36.3 C) (Oral)   Resp 20   Ht 6\' 2"  (1.88 m)   Wt 101.2 kg (223 lb)   SpO2 97%   BMI 28.63 kg/m   Physical Exam  Constitutional: He is oriented to person, place, and time. He appears well-developed.  HENT:    Head: Atraumatic.  Eyes: EOM are normal.  Neck: Neck supple.  Cardiovascular: Normal rate.  Pulmonary/Chest: Effort normal.  Abdominal: Soft.  Musculoskeletal: He exhibits no tenderness.  Neurological: He is alert and oriented to person, place, and time.  Mild tremor of bilateral hands.  Skin: Skin is warm. Capillary refill takes less than 2 seconds.  Psychiatric: His behavior is normal.     ED Treatments / Results  Labs (all labs ordered are listed, but only abnormal results are displayed) Labs Reviewed  COMPREHENSIVE METABOLIC PANEL - Abnormal; Notable for the following components:      Result Value   Total Bilirubin 1.3 (*)    GFR calc non Af Amer 60 (*)    All other components within normal limits  ACETAMINOPHEN LEVEL - Abnormal; Notable for the following components:   Acetaminophen (Tylenol), Serum <10 (*)    All other components within normal limits  RAPID URINE DRUG SCREEN, HOSP PERFORMED - Abnormal; Notable for the following components:   Benzodiazepines POSITIVE (*)    All other components within normal limits  VITAMIN B12 - Abnormal; Notable for the following components:   Vitamin B-12 157 (*)    All other components within normal limits  ETHANOL  SALICYLATE LEVEL  CBC  TROPONIN I  TSH  RPR    EKG EKG Interpretation  Date/Time:  Friday October 27 2017 20:49:43 EDT Ventricular Rate:  67 PR Interval:  152 QRS Duration: 106 QT Interval:  416 QTC Calculation: 439 R Axis:   62 Text Interpretation:  Normal sinus rhythm Right atrial enlargement RSR' or QR pattern in V1 suggests right ventricular conduction delay Borderline ECG Confirmed by Davonna Belling [phone]) on 10/27/2017 9:10:43 PM   Radiology Dg Chest 2 View  Result Date: 10/27/2017 CLINICAL DATA:  Syncope EXAM: CHEST - 2 VIEW COMPARISON:  None. FINDINGS: There is no edema or consolidation. The heart size and pulmonary vascularity are normal. No adenopathy. There is mild degenerative change in the  lower thoracic spine. IMPRESSION: No edema or consolidation. Electronically Signed   By: Lowella Grip III M.D.   On: 10/27/2017 19:43   Mr Brain Wo Contrast  Result Date: 10/27/2017 CLINICAL DATA:  Worsening short-term memory loss. EXAM: MRI HEAD WITHOUT CONTRAST TECHNIQUE: Multiplanar, multiecho pulse sequences of the brain and surrounding structures were obtained without intravenous contrast. COMPARISON:  CT HEAD August 04, 2004 FINDINGS: INTRACRANIAL CONTENTS: No reduced diffusion to suggest acute ischemia. No susceptibility artifact to suggest hemorrhage. Moderate to severe parenchymal brain volume loss with disproportionate mesial temporal lobe volume loss. No hydrocephalus. No abnormal parenchymal signal. No mass lesions or mass effect. No abnormal extra-axial  fluid collections. VASCULAR: Normal major intracranial vascular flow voids present at skull base. SKULL AND UPPER CERVICAL SPINE: No abnormal sellar expansion. No suspicious calvarial bone marrow signal. Craniocervical junction maintained. SINUSES/ORBITS: The mastoid air-cells and included paranasal sinuses are well-aerated.The included ocular globes and orbital contents are non-suspicious. Status post bilateral ocular lens implants. OTHER: None. IMPRESSION: 1. No acute intracranial process. 2. 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 Electronically Signed   By: Elon Alas M.D.   On: 10/27/2017 20:32    Procedures Procedures (including critical care time)  Medications Ordered in ED Medications  LORazepam (ATIVAN) injection 1 mg (1 mg Intramuscular Given 10/27/17 1954)  sertraline (ZOLOFT) tablet 200 mg (has no administration in time range)  simvastatin (ZOCOR) tablet 40 mg (has no administration in time range)     Initial Impression / Assessment and Plan / ED Course  I have reviewed the triage vital signs and the nursing  notes.  Pertinent labs & imaging results that were available during my care of the patient were reviewed by me and considered in my medical decision making (see chart for details).     Patient sent in for medical clearance.  Has had worsening depression and wants to go in the woods like the [place] do to die and not be a barrier on society.  However there was some worry and there is been some work-up for his memory loss and some movement disorders.  Movements have gotten worse.  MRI done and did show some worrisome findings for neurodegenerative disease.  Will need to follow-up with neuropsychiatry as an outpatient.  Some lab work is still pending.  Discussed with both with Dr. Malen Gauze and Dr. Cheral Marker from neurology.  Patient is been accepted at behavioral health for transfer.  Patient was up for transfer to behavioral health.  Although apparently was not going to be a private room and the patient's wife did not want him to go and patient does not want to go with his exwife does not want him to go right now.  Patient's ex-wife had said earlier that she thought he needed to sleep well tonight.  Patient will be kept in the ER tonight and reevaluated in the morning.  Final Clinical Impressions(s) / ED Diagnoses   Final diagnoses:  Depression, unspecified depression type    ED Discharge Orders    None       Davonna Belling, MD 10/27/17 4825    Davonna Belling, MD 10/28/17 0001

## 2017-10-27 NOTE — H&P (Signed)
Behavioral Health Medical Screening Exam  David [name] is an 72 y.o. male.  Total Time spent with patient: 30 minutes  Psychiatric Specialty Exam: Physical Exam  Nursing note and vitals reviewed. Constitutional: He appears well-developed and well-nourished.  Cardiovascular: Normal rate.  Respiratory: Effort normal.  Musculoskeletal: Normal range of motion.  Neurological: He is alert.  Skin: Skin is warm.    Review of Systems  Constitutional: Negative.   HENT: Negative.   Eyes: Negative.   Respiratory: Negative.   Cardiovascular: Negative.   Gastrointestinal: Negative.   Genitourinary: Negative.   Musculoskeletal: Negative.   Skin: Negative.   Neurological: Positive for dizziness.  Endo/Heme/Allergies: Negative.        Reports at least one syncopal episode in the last 24 hours  Psychiatric/Behavioral: Positive for depression and suicidal ideas. Negative for hallucinations and substance abuse.    Blood pressure 140/76, pulse 82, temperature 98.2 F (36.8 C), SpO2 99 %.There is no height or weight on file to calculate BMI.  General Appearance: Casual and Fairly Groomed  Eye Contact:  Fair  Speech:  Clear and Coherent and Slow  Volume:  Normal  Mood:  Depressed  Affect:  Flat  Thought Process:  Linear and Descriptions of Associations: Intact  Orientation:  Full (Time, Place, and Person)  Thought Content:  WDL  Suicidal Thoughts:  Yes.  without intent/plan  Homicidal Thoughts:  No  Memory:  Immediate;   Fair Recent;   Poor Remote;   Good  Judgement:  Good  Insight:  Fair  Psychomotor Activity:  Decreased  Concentration: Concentration: Fair  Recall:  [place] of Knowledge:Good  Language: Good  Akathisia:  No  Handed:  Right  AIMS (if indicated):     Assets:  Communication Skills Desire for Improvement Financial Resources/Insurance Housing Social Support Transportation  Sleep:       Musculoskeletal: Strength & Muscle Tone: within normal limits Gait &  Station: normal Patient leans: N/A  Blood pressure 140/76, pulse 82, temperature 98.2 F (36.8 C), SpO2 99 %.  Recommendations:  Based on my evaluation the patient appears to have an emergency medical condition for which I recommend the patient be transferred to the emergency department for further evaluation.  [place], FNP 10/27/2017, 5:19 PM

## 2017-10-27 NOTE — ED Notes (Signed)
Bed: WLPT4 Expected date:  Expected time:  Means of arrival:  Comments: 

## 2017-10-27 NOTE — [facility] Counselor (Addendum)
Per Larose Kells pt has been accepted to [facility] and assigned to room/bed: 406-1, pt can come now. Attending physician: Dr. Mallie Darting. Nursing report: [phone]. Disposition discussed with Dr. Alvino Chapel and Rip Harbour, RN.   Vertell Novak, MS, [facility], [facility] Triage Specialist [phone]

## 2017-10-28 ENCOUNTER — Emergency Department (HOSPITAL_COMMUNITY): Payer: Medicare Other

## 2017-10-28 DIAGNOSIS — F332 Major depressive disorder, recurrent severe without psychotic features: Secondary | ICD-10-CM | POA: Diagnosis present

## 2017-10-28 DIAGNOSIS — F411 Generalized anxiety disorder: Secondary | ICD-10-CM

## 2017-10-28 LAB — RPR: RPR: NONREACTIVE

## 2017-10-28 MED ORDER — HYDROXYZINE HCL 10 MG PO TABS
10.0000 mg | ORAL_TABLET | Freq: Two times a day (BID) | ORAL | Status: DC
  Administered 2017-10-28 – 2017-10-29 (×3): 10 mg via ORAL
  Filled 2017-10-28 (×3): qty 1

## 2017-10-28 MED ORDER — SERTRALINE HCL 50 MG PO TABS
100.0000 mg | ORAL_TABLET | Freq: Every day | ORAL | Status: DC
  Administered 2017-10-29: 100 mg via ORAL
  Filled 2017-10-28: qty 2

## 2017-10-28 MED ORDER — ALPRAZOLAM 0.5 MG PO TABS
0.5000 mg | ORAL_TABLET | Freq: Two times a day (BID) | ORAL | Status: DC | PRN
  Administered 2017-10-28 (×2): 0.5 mg via ORAL
  Filled 2017-10-28 (×2): qty 1

## 2017-10-28 MED ORDER — SIMVASTATIN 40 MG PO TABS
40.0000 mg | ORAL_TABLET | Freq: Every day | ORAL | Status: DC
  Administered 2017-10-28 (×2): 40 mg via ORAL
  Filled 2017-10-28 (×4): qty 1

## 2017-10-28 MED ORDER — TAMSULOSIN HCL 0.4 MG PO CAPS
0.4000 mg | ORAL_CAPSULE | Freq: Every day | ORAL | Status: DC
  Administered 2017-10-28 (×2): 0.4 mg via ORAL
  Filled 2017-10-28 (×4): qty 1

## 2017-10-28 MED ORDER — SERTRALINE HCL 50 MG PO TABS
200.0000 mg | ORAL_TABLET | Freq: Every day | ORAL | Status: DC
  Administered 2017-10-28: 200 mg via ORAL
  Filled 2017-10-28: qty 4

## 2017-10-28 NOTE — Progress Notes (Signed)
Report called to RN at [facility] for room 406. Patient decided he would not go tonight. Wait and think about it in the morning. Patient and ex-wife upset regarding results of MRI and patient states he just needs to sleep tonight to be able to process all of this in the morning. Will monitor. Hoyle Barr, RN

## 2017-10-28 NOTE — [facility] Counselor (Signed)
Clinician observed the pt was on the phone with his ex-wife and wanted the clinician to talk to her. [place] obtained verbal consent from the pt. Pt reported, in order for him to go to [facility] his ex-wife has to be in agreement. Clinician expressed that he was not assigned a private room. Clinician updated Dr. Alvino Chapel and the disposition was changed to overnight observation for safety and stabilization. The pt and his ex-wife are in agreement with the disposition.  Updated Harrell Lark, RN.   Vertell Novak, MS, [facility], [facility] Triage Specialist [phone]

## 2017-10-28 NOTE — ED Notes (Signed)
Pt has been IVC"d

## 2017-10-28 NOTE — ED Notes (Signed)
Pt resting peacefully with no signs of distress. Pt refused breakfast. Pt stated that he only eats one meal a day and is not hungry . Pt is pleasant and cooperative at this time.

## 2017-10-28 NOTE — Progress Notes (Signed)
CSW received call from [facility] regarding patient. CSW re-faxed IVC paperwork and provided information to assessment department. [facility] to return call.  Stephanie Acre, [place] Social Worker [phone]

## 2017-10-28 NOTE — ED Notes (Signed)
Verbal permission from patient to give information to [name] (wife).

## 2017-10-28 NOTE — Progress Notes (Addendum)
CSW faxed IVC paperwork to weekend magistrate. [employer] to serve patient, paperwork in chart, nurse informed.   CSW will fax out geropsych referrals after IVC paperwork is served   Stephanie Acre, [place] Worker [phone]

## 2017-10-28 NOTE — ED Notes (Signed)
Pt to room #43. Pt behavior cooperative, pleasant. Pt tangential on approach. Speech pressured and rapid. Reports increase stress and depression. Pt identifies primary stressor as "dysfunctional family unit." pt endorsing SI. encouragement and support provided. Special checks q 15 mins in place for safety, video monitoring in place. Will continue to monitor.

## 2017-10-28 NOTE — Consult Note (Addendum)
[facility] Consult   Reason for Consult:  Suicidal ideation Referring Physician:  EDP Patient Identification: David [name] MRN:  [id number] Principal Diagnosis: Major depressive disorder, recurrent episode, severe ([place]) Diagnosis:   Patient Active Problem List   Diagnosis Date Noted  . Major depressive disorder, recurrent episode, severe ([place]) [F33.2] 10/28/2017  . Generalized anxiety disorder [F41.1] 10/28/2017    Total Time spent with patient: 45 minutes  Subjective:   David [name] is a 72 y.o. male patient admitted with suicidal ideation.  HPI:  Pt was seen and chart reviewed with treatment team and Dr Darleene Cleaver.  Pt denies homicidal ideation, denies auditory/visual hallucinations and does not appear to be responding to internal stimuli. Pt stated he has passive suicidal ideations and is very sad. Pt stated he has been having memory problems and has been more confused lately. Pt stated he lives alone and takes care of himself and also his 84 year old mother's finances, she is in a nursing home. Pt was circumstantial in all of his explanations to why he is in the hospital. Pt went on about his family and all of his extended familes problems when trying to explain himself. Pt has recently weaned himself off his Wellbutrin because he didn't like the way it made him feel. Pt is a former Music therapist. Pt stated he is sleeping poorly at times and his appetite is ok. Pt has recently lost 20 lbs because he wanted to not because he is not eating right. Pt's labs were reviewed with no abnormalities found, EKG reviewed, NSR. Pt's UDS and BAL are negative. Pt has had neurology work up for memory loss in 2018.  Pt would benefit from an inpatient psychiatric admission for crisis stabilization and medication management.   Past Psychiatric History: As above  Risk to Self: Suicidal Ideation: (triggered by losing memory ) Risk to Others:  No Prior Inpatient Therapy:  Yes years  ago Prior Outpatient Therapy:  yes   Past Medical History:  Past Medical History:  Diagnosis Date  . Anxiety   . Depression   . Hyperlipidemia   . Hypertension   . Sleep apnea    CPAP    Past Surgical History:  Procedure Laterality Date  . CATARACT EXTRACTION, BILATERAL  2018  . EXTERNAL EAR SURGERY     child  . EYE SURGERY Bilateral    cataract  . right knee meniscus  06/12/2008  . right talus repair     dislocated   Family History:  Family History  Problem Relation Age of Onset  . Alzheimer's disease Mother   . Cancer Father        prostate  . Thyroid disease Father   . Alzheimer's disease Maternal Grandmother   . Heart disease Maternal Grandfather   . Stroke Paternal Grandmother   . Stroke Paternal Grandfather   . Alcoholism Brother    Family Psychiatric  History: Unknown Social History:  Social History   Substance and Sexual Activity  Alcohol Use Yes   Comment: Vodka occasionally, not daily     Social History   Substance and Sexual Activity  Drug Use Yes  . Types: Marijuana   Comment: Occasional    Social History   Socioeconomic History  . Marital status: Legally Separated    Spouse name: [name]  . Number of children: 2  . Years of education: 41  . 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 Financial resource strain: Not on file  . Food insecurity:    Worry: Not on file    Inability: Not on file  . Transportation needs:    Medical: Not on file    Non-medical: Not on file  Tobacco Use  . Smoking status: Never Smoker  . Smokeless tobacco: Never Used  Substance and Sexual Activity  . Alcohol use: Yes    Comment: Vodka occasionally, not daily  . Drug use: Yes    Types: Marijuana    Comment: Occasional  . Sexual activity: Never  Lifestyle  . Physical activity:    Days per week: Not on file    Minutes per session: Not on file  . Stress: Not on file  Relationships  .  Social connections:    Talks on phone: Not on file    Gets together: Not on file    Attends religious service: Not on file    Active member of club or organization: Not on file    Attends meetings of clubs or organizations: Not on file    Relationship status: Not on file  Other Topics Concern  . Not on file  Social History Narrative  . Not on file   Additional Social History:    Allergies:   Allergies  Allergen Reactions  . Lamictal [Lamotrigine]   . Wellbutrin [Bupropion]     Labs:  Results for orders placed or performed during the hospital encounter of 10/27/17 (from the past 48 hour(s))  Troponin I     Status: None   Collection Time: 10/27/17  6:57 PM  Result Value Ref Range   Troponin I <0.03 <0.03 ng/mL    Comment: Performed at [facility], [place] [address], [place], [place] [address]  Comprehensive metabolic panel     Status: Abnormal   Collection Time: 10/27/17  6:58 PM  Result Value Ref Range   Sodium 143 135 - 145 mmol/L   Potassium 4.0 3.5 - 5.1 mmol/L   Chloride 110 98 - 111 mmol/L    Comment: Please note change in reference range.   CO2 24 22 - 32 mmol/L   Glucose, Bld 85 70 - 99 mg/dL    Comment: Please note change in reference range.   BUN 16 8 - 23 mg/dL    Comment: Please note change in reference range.   Creatinine, Ser 1.19 0.61 - 1.24 mg/dL   Calcium 9.4 8.9 - 10.3 mg/dL   Total Protein 6.7 6.5 - 8.1 g/dL   Albumin 4.2 3.5 - 5.0 g/dL   AST 20 15 - 41 U/L   ALT 18 0 - 44 U/L    Comment: Please note change in reference range.   Alkaline Phosphatase 40 38 - 126 U/L   Total Bilirubin 1.3 (H) 0.3 - 1.2 mg/dL   GFR calc non Af Amer 60 (L) >60 mL/min   GFR calc Af Amer >60 >60 mL/min    Comment: (NOTE) The eGFR has been calculated using the CKD EPI equation. This calculation has not been validated in all clinical situations. eGFR's persistently <60 mL/min signify possible Chronic Kidney Disease.    Anion gap 9 5 - 15    Comment:  Performed at [facility], [place] [address] [place], [place] [address]  cbc     Status: None   Collection Time: 10/27/17  6:58 PM  Result Value Ref Range   WBC 8.3 4.0 - 10.5 K/uL   RBC 4.97 4.22 -  5.81 MIL/uL   Hemoglobin 15.8 13.0 - 17.0 g/dL   HCT 45.8 39.0 - 52.0 %   MCV 92.2 78.0 - 100.0 fL   MCH 31.8 26.0 - 34.0 pg   MCHC 34.5 30.0 - 36.0 g/dL   RDW 13.1 11.5 - 15.5 %   Platelets 191 150 - 400 K/uL    Comment: Performed at [facility], [place] [address], [place], [place] [address]  Ethanol     Status: None   Collection Time: 10/27/17  6:59 PM  Result Value Ref Range   Alcohol, Ethyl (B) <10 <10 mg/dL    Comment: (NOTE) Lowest detectable limit for serum alcohol is 10 mg/dL. For medical purposes only. Performed at [facility], [place] [address], [place], [place] [address]   Salicylate level     Status: None   Collection Time: 10/27/17  6:59 PM  Result Value Ref Range   Salicylate Lvl <6.7 2.8 - 30.0 mg/dL    Comment: Performed at [facility], [place] [address], [place], [place] [address]  Acetaminophen level     Status: Abnormal   Collection Time: 10/27/17  6:59 PM  Result Value Ref Range   Acetaminophen (Tylenol), Serum <10 (L) 10 - 30 ug/mL    Comment: (NOTE) Therapeutic concentrations vary significantly. A range of 10-30 ug/mL  may be an effective concentration for many patients. However, some  are best treated at concentrations outside of this range. Acetaminophen concentrations >150 ug/mL at 4 hours after ingestion  and >50 ug/mL at 12 hours after ingestion are often associated with  toxic reactions. Performed at [facility], [place] [address], [place], [place] [address]   Rapid urine drug screen (hospital performed)     Status: Abnormal   Collection Time: 10/27/17  7:44 PM  Result Value Ref Range   Opiates NONE DETECTED NONE DETECTED   Cocaine NONE DETECTED NONE DETECTED    Benzodiazepines POSITIVE (A) NONE DETECTED   Amphetamines NONE DETECTED NONE DETECTED   Tetrahydrocannabinol NONE DETECTED NONE DETECTED   Barbiturates NONE DETECTED NONE DETECTED    Comment: (NOTE) DRUG SCREEN FOR MEDICAL PURPOSES ONLY.  IF CONFIRMATION IS NEEDED FOR ANY PURPOSE, NOTIFY LAB WITHIN 5 DAYS. LOWEST DETECTABLE LIMITS FOR URINE DRUG SCREEN Drug Class                     Cutoff (ng/mL) Amphetamine and metabolites    1000 Barbiturate and metabolites    200 Benzodiazepine                 121 Tricyclics and metabolites     300 Opiates and metabolites        300 Cocaine and metabolites        300 THC                            50 Performed at [facility], [place] [address] [place], [place] [address]   Vitamin B12     Status: Abnormal   Collection Time: 10/27/17  9:47 PM  Result Value Ref Range   Vitamin B-12 157 (L) 180 - 914 pg/mL    Comment: (NOTE) This assay is not validated for testing neonatal or myeloproliferative syndrome specimens for Vitamin B12 levels. Performed at [facility], [place] [address] [place], [place] [address]   TSH     Status: None   Collection Time: 10/27/17  9:47 PM  Result Value Ref Range   TSH 1.624 0.350 - 4.500 uIU/mL    Comment: Performed by a 3rd Generation assay with a functional sensitivity of <=0.01 uIU/mL. Performed at [facility], [place] [address] [place], [place] [address]     Current Facility-Administered Medications  Medication Dose Route Frequency Provider Last Rate Last Dose  . ALPRAZolam Duanne Moron) tablet 0.5 mg  0.5 mg Oral BID PRN Davonna Belling, MD   0.5 mg at 10/28/17 0020  . LORazepam (ATIVAN) injection 1 mg  1 mg Intramuscular PRN Davonna Belling, MD   1 mg at 10/27/17 1954  . sertraline (ZOLOFT) tablet 200 mg  200 mg Oral QHS Davonna Belling, MD   200 mg at 10/28/17 0020  . simvastatin (ZOCOR) tablet 40 mg  40 mg Oral Lowanda Foster, MD   40 mg at  10/28/17 0020  . tamsulosin (FLOMAX) capsule 0.4 mg  0.4 mg Oral QHS Davonna Belling, MD   0.4 mg at 10/28/17 0020   Current Outpatient Medications  Medication Sig Dispense Refill  . ALPRAZolam (XANAX) 0.5 MG tablet Take 0.5 mg by mouth 2 (two) times daily as needed for anxiety. At bedtime as needed    . sertraline (ZOLOFT) 100 MG tablet Take 200 mg by mouth daily.    . simvastatin (ZOCOR) 40 MG tablet Take 40 mg by mouth every evening.    . Tamsulosin HCl (FLOMAX) 0.4 MG CAPS Take 1 mg by mouth daily.    . hydrOXYzine (ATARAX/VISTARIL) 25 MG tablet Take 25 mg by mouth at bedtime.       Musculoskeletal: Strength & Muscle Tone: within normal limits Gait & Station: normal Patient leans: N/A  Psychiatric Specialty Exam: Physical Exam  Constitutional: He is oriented to person, place, and time. He appears well-developed and well-nourished.  HENT:  Head: Normocephalic.  Respiratory: Effort normal.  Musculoskeletal: Normal range of motion.  Neurological: He is alert and oriented to person, place, and time.  Psychiatric: His speech is normal and behavior is normal. Thought content normal. His mood appears anxious. Cognition and memory are normal. He expresses impulsivity. He exhibits a depressed mood.    Review of Systems  Psychiatric/Behavioral: Positive for depression. Negative for hallucinations, memory loss, substance abuse and suicidal ideas. The patient is not nervous/anxious and does not have insomnia.   All other systems reviewed and are negative.   Blood pressure 122/80, pulse 75, temperature (!) 97.5 F (36.4 C), temperature source Oral, resp. rate 16, height _0  (1.88 m), weight 223 lb (101.2 kg), SpO2 100 %.Body mass index is 28.63 kg/m.  General Appearance: Casual  Eye Contact:  Good  Speech:  Clear and Coherent and Normal Rate  Volume:  Normal  Mood:  Depressed  Affect:  Congruent and Depressed  Thought Process:  Coherent, Goal Directed, Linear and Descriptions of  Associations: Intact  Orientation:  Full (Time, Place, and Person)  Thought Content:  Logical  Suicidal Thoughts:  Yes.  without intent/plan  Homicidal Thoughts:  No  Memory:  Immediate;   Good Recent;   Good Remote;   Fair  Judgement:  Fair  Insight:  Fair  Psychomotor Activity:  Normal  Concentration:  Concentration: Good and Attention Span: Good  Recall:  Good  Fund of Knowledge:  Good  Language:  Good  Akathisia:  No  Handed:  Right  AIMS (if indicated):     Assets:  Agricultural consultant Housing Social Support  ADL's:  Intact  Cognition:  WNL  Sleep:        Treatment Plan Summary: Daily contact with patient to assess and evaluate symptoms and progress in treatment and Medication management (see MAR)  Disposition: Recommend psychiatric Inpatient admission when medically cleared. TTS to seek [facility] admission  Ethelene Hal, NP 10/28/2017 11:53 AM  Patient seen face-to-face for psychiatric evaluation, chart reviewed and case discussed with the physician extender and developed treatment plan. Reviewed the information documented and agree with the treatment plan. Corena Pilgrim, MD

## 2017-10-28 NOTE — ED Notes (Signed)
Bed has been changed, [facility] will call back with new bed assignment

## 2017-10-28 NOTE — ED Notes (Signed)
Up to the bathroom, ambulatory w/o difficulty 

## 2017-10-28 NOTE — ED Notes (Signed)
On the phone 

## 2017-10-28 NOTE — ED Notes (Signed)
Bed: [facility] Expected date:  Expected time:  Means of arrival:  Comments: Rm 26

## 2017-10-28 NOTE — ED Notes (Signed)
SBAR Report received from previous nurse. Pt received calm and visible on unit. Pt denies current SI/ HI, A/V H, or pain at this time, and appears otherwise stable and free of distress. Pt endorses anxiety and asked for his medications. Pt reminded of camera surveillance, q 15 min rounds, and rules of the milieu. Will continue to assess.

## 2017-10-28 NOTE — ED Notes (Signed)
Pt breakfast was warmed, pt eating breakfast now.

## 2017-10-28 NOTE — ED Notes (Signed)
Pt talking on phone with son.

## 2017-10-28 NOTE — ED Notes (Signed)
Per [facility] pt has been declined at [facility] due to MRI results-recommend geripsych

## 2017-10-28 NOTE — ED Notes (Signed)
Pt ambulatory w/o difficulty to room 43, belongings moved to new unit

## 2017-10-28 NOTE — ED Notes (Signed)
Pt reports that he does not use a CPAP at home any longer.

## 2017-10-28 NOTE — ED Notes (Signed)
Belongings located in [place] of [facility] unit. 1 pt belongings bag and 1 tan canvas duffle bag with blue straps. Both bags have pt labels affixed to them.

## 2017-10-28 NOTE — ED Notes (Signed)
Pt's wife notified at pt's request that he is being moved to room 43

## 2017-10-28 NOTE — ED Notes (Addendum)
Dr Tiburcio Pea and Margarita Grizzle NP into see.  Pt pleasant, alert oriented, talkative about his family and his past history.  Pt denies HI, states that he is "unsure" about AVH and did not give a answer regarding SI. When asked pt responded that he was "sad".  Pt reports that he has been feeling "confused." Pt reports that this is not new and has been going on for quite a while.  Pt reports that he is lives alone and has been able to care for his self despite the confusion.  Pt also reports increased anxiety due to the increase in his short term memory loss.  Pt reports hx of depression that he has been treating with medication that he gets thru a NP.  Pt reports increased depression related to his mothers decline, family issues, and mentor's recent stroke.  Pt reports previous hospitalization at [facility] for "nervous breakdown", occasionally difficulty sleeping, and intentional wt loss (diet and exercise.)  When asked again about suicidal thoughts pt answered "want the pain to abate."

## 2017-10-28 NOTE — ED Notes (Signed)
Dave TTS into see

## 2017-10-28 NOTE — Progress Notes (Signed)
This patient continues to meet inpatient criteria. CSW fax information to the following facilities:   [facility] [place], [place] Social Worker [phone]

## 2017-10-29 NOTE — ED Notes (Signed)
Pt talking on hallway phone.  

## 2017-10-29 NOTE — ED Notes (Signed)
Pt up for discharge .Pt informs nurse that wife will be here to pick him up .

## 2017-10-29 NOTE — [facility] Suicide Risk Assessment (Signed)
Suicide Risk Assessment  Discharge Assessment   [facility] Discharge Suicide Risk Assessment   Principal Problem: Major depressive disorder, recurrent episode, severe ([place]) Discharge Diagnoses:  Patient Active Problem List   Diagnosis Date Noted  . Major depressive disorder, recurrent episode, severe ([place]) [F33.2] 10/28/2017  . Generalized anxiety disorder [F41.1] 10/28/2017    Total Time spent with patient: 30 minutes  Musculoskeletal: Strength & Muscle Tone: within normal limits Gait & Station: normal Patient leans: N/A  Psychiatric Specialty Exam:   Blood pressure (!) 149/78, pulse (!) 57, temperature 97.8 F (36.6 C), temperature source Oral, resp. rate 18, height 6\' 2"  (1.88 m), weight 223 lb (101.2 kg), SpO2 98 %.Body mass index is 28.63 kg/m.  General Appearance: Casual  Eye Contact::  Good  Speech:  Clear and Coherent and Normal Rate409  Volume:  Normal  Mood:  Depressed  Affect:  Congruent and Depressed  Thought Process:  Coherent, Goal Directed, Linear and Descriptions of Associations: Intact  Orientation:  Full (Time, Place, and Person)  Thought Content:  Logical  Suicidal Thoughts:  No  Homicidal Thoughts:  No  Memory:  Immediate;   Good Recent;   Good Remote;   Fair  Judgement:  Impaired  Insight:  Fair  Psychomotor Activity:  Normal  Concentration:  Good  Recall:  Good  Fund of Knowledge:Good  Language: Good  Akathisia:  No  Handed:  Right  AIMS (if indicated):     Assets:  Industrial/product designer Vocational/Educational  Sleep:     Cognition: WNL  ADL's:  Intact   Mental Status Per Nursing Assessment::   On Admission:   Confused, depressed and anxious  Demographic Factors:  Age 33 or older  Loss Factors: Financial problems/change in socioeconomic status  Historical Factors: Impulsivity  Risk Reduction Factors:   Sense of responsibility to family  Continued Clinical Symptoms:  Severe  Anxiety and/or Agitation Depression:   Impulsivity  Cognitive Features That Contribute To Risk:  Closed-mindedness    Suicide Risk:  Minimal: No identifiable suicidal ideation.  Patients presenting with no risk factors but with morbid ruminations; may be classified as minimal risk based on the severity of the depressive symptoms   Plan Of Care/Follow-up recommendations:  Activity:  as tolerated Diet:  Heart Healthy  Ethelene Hal, NP 10/29/2017, 11:46 AM

## 2017-10-29 NOTE — ED Notes (Signed)
Report given to AS RN

## 2017-10-29 NOTE — ED Notes (Signed)
Pt taking a shower 

## 2017-10-29 NOTE — ED Notes (Signed)
Staff walked pt out into the lobby with his wife and daughter. Pt wife stated that, " I don't doubt you can articulate things, but I rather speak to a nurse." Wife also stated that she will not leave the lobby until she speaks to someone. Staff informed nurse and nurse went to speak to wife.

## 2017-10-29 NOTE — ED Notes (Signed)
Pt d/c home per MD order. Discharge summary reviewed with pt, pt verbalizes understanding. Pt denies SI/HI. Personal property returned to pt. Pt signed e-signature. Ambulatory off unit with MHT. Pt wife in lobby for discharge.

## 2017-10-29 NOTE — Progress Notes (Signed)
Patient is no longer IVC'd.  Stephanie Acre, [place] Social Worker [phone]

## 2017-10-29 NOTE — Progress Notes (Signed)
CSW received call from [facility] regarding [facility] inpatient referral. There are no beds at this time.  Stephanie Acre, [place] Social Worker [phone]

## 2017-10-29 NOTE — BH Assessment (Addendum)
[place] REASSESSMENT  Pt in scrubs, laying on his back under covers in SAPP U bed. Pt presents calm & cooperative. He reports he is ready to be released home "to try & manage his life". Pt denies current SI. He reports increase in anxiety as he considers how to manage his family relationships, especially care for his elderly mother. Pt did not have an answer as to how he will care for himself differently than prior to this ED admission. Pt was advised that acute inpt tx was recommended for him. Pt stated if was to be the case then he would have to leave it to his higher power.

## 2017-10-29 NOTE — ED Notes (Signed)
Pt wife in lobby to pick up pt being discharged. Pt wife irate, verbally aggressive with this nurse. Demeaning towards this nurse and staff. Upset because pt is being discharged. Pt wife demanding to speak to who is in charge, this nurse consulted with Manuela Schwartz, RN charge, susan in lobby to speak with pt wife.

## 2017-10-30 ENCOUNTER — Ambulatory Visit: Payer: Medicare Other | Admitting: Psychology

## 2017-10-31 ENCOUNTER — Other Ambulatory Visit: Payer: Self-pay | Admitting: Family Medicine

## 2017-10-31 DIAGNOSIS — R413 Other amnesia: Secondary | ICD-10-CM

## 2017-11-02 ENCOUNTER — Telehealth: Payer: Self-pay | Admitting: Diagnostic Neuroimaging

## 2017-11-02 NOTE — Telephone Encounter (Signed)
Pt wife(on DPR-[place],[name]) has called asking that pt be scheduled to see Dr Leta Baptist as soon as possible due to a rapid increase in pt's forgetfulness, agitation, increase tremors. Wife states that Pt PCP Dr Drema Dallas suggested [facility].  Wife states Pt had a recent MRI which showed brain volume loss.  Wife has accepted 1st available appointment and for pt to be on wait list but she would like RN to call if there is anyway for pt to be seen earlier than November.  Pt wife was also encouraged to call from time to time to see if there has been any recent cancellations.

## 2017-11-03 NOTE — Telephone Encounter (Signed)
ERROR

## 2017-11-03 NOTE — Telephone Encounter (Signed)
I recommend psychiatry follow up and neuropsychology testing first. -VRP

## 2017-11-03 NOTE — Telephone Encounter (Signed)
Can work in 11-15-17 1500

## 2017-11-03 NOTE — Telephone Encounter (Signed)
I called and spoke to pt.  I relayed that [name] wants him to follow up psychiatry first and then to see him after this.  Will keep appt for 03/05/2018 as is for right now.  He is to call back as needed.  He has been seeing psychiatry and will follow up.   Neuropsych testing to be done again Nov 2019.  He verbalized understanding.

## 2017-11-06 NOTE — Telephone Encounter (Signed)
Consulted [name].  He viewed the MRI, and BH notes in epic.  At this time would offer no new recommendations.  Would continue with psychiatry appointments as requested.   Can dicuss later this week by phone or make f/u 4-6 wks.  I attempted to call wife, David [name] and she was at the bank, will call her back in 5 minutes.

## 2017-11-06 NOTE — Telephone Encounter (Signed)
Pt's wife [name]/DPR [phone] called to discuss the message relayed to the patient. She said there have been changes on the MRI on 7/19.  Also to discuss the recent hospitalization.  Please call to discuss

## 2017-11-06 NOTE — Telephone Encounter (Signed)
I spoke with patient's wife (30 minutes).  Patient having progressive short-term memory loss, confusion, sundowning, depression, anxiety, hopelessness, suicidal ideation.  MRI of the brain on 10/27/2017 shows significant temporal and parietal atrophy.  Constellation of symptoms concerning for neurodegenerative dementia.  Underlying depression and anxiety also significant factor.   Patient living alone.  Patient and wife have separated since 2012.  Patient wife is healthcare power of attorney.  She is under significant caregiver stress.  They have not had good experience with his current psychiatrist and are looking to establish with a new team.  PLAN: - Repeat neuropsychological testing with Dr. 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

## 2017-11-07 ENCOUNTER — Other Ambulatory Visit: Payer: Self-pay

## 2017-11-07 NOTE — Telephone Encounter (Signed)
I called [facility].  No additional referral needed. Just have pt's wife call and make appt with them at  [phone].  Once seen again for neuropsych testing, then will see pt in Dr. Gerda Diss clinic.  I LMVM for David [name],  Wife, and if has questions to call me back.

## 2017-11-07 NOTE — Telephone Encounter (Signed)
Wife called back.  She relayed that she called Dr. Halina Andreas office and her next available was 05-14-2018.  She is on cancellation list.  Also would like to see neuropsychiatrist instead of psychiatry.  Please advise.

## 2017-12-14 ENCOUNTER — Other Ambulatory Visit: Payer: Self-pay

## 2017-12-14 ENCOUNTER — Encounter: Payer: Self-pay | Admitting: Family Medicine

## 2017-12-14 ENCOUNTER — Ambulatory Visit: Payer: Medicare Other | Admitting: Family Medicine

## 2017-12-14 VITALS — BP 132/83 | HR 66 | Temp 97.5°F | Resp 17 | Ht 74.0 in | Wt 231.4 lb

## 2017-12-14 DIAGNOSIS — B351 Tinea unguium: Secondary | ICD-10-CM | POA: Diagnosis not present

## 2017-12-14 DIAGNOSIS — L608 Other nail disorders: Secondary | ICD-10-CM | POA: Diagnosis not present

## 2017-12-14 NOTE — Patient Instructions (Addendum)
I will refer you to podiatry for your nails. If any increased redness or swelling of skin beside the nail in the meantime, please return for recheck.   Return to the clinic or go to the nearest emergency room if any of your symptoms worsen or new symptoms occur.  Thank you for coming in today.    Fungal Nail Infection Fungal nail infection is a common fungal infection of the toenails or fingernails. This condition affects toenails more often than fingernails. More than one nail may be infected. The condition can be passed from person to person (is contagious). What are the causes? This condition is caused by a fungus. Several types of funguses can cause the infection. These funguses are common in moist and warm areas. If your hands or feet come into contact with the fungus, it may get into a crack in your fingernail or toenail and cause the infection. What increases the risk? The following factors may make you more likely to develop this condition:  Being male.  Having diabetes.  Being of older age.  Living with someone who has the fungus.  Walking barefoot in areas where the fungus thrives, such as showers or locker rooms.  Having poor circulation.  Wearing shoes and socks that cause your feet to sweat.  Having athlete's foot.  Having a nail injury or history of a recent nail surgery.  Having psoriasis.  Having a weak body defense system (immune system).  What are the signs or symptoms? Symptoms of this condition include:  A pale spot on the nail.  Thickening of the nail.  A nail that becomes yellow or brown.  A brittle or ragged nail edge.  A crumbling nail.  A nail that has lifted away from the nail bed.  How is this diagnosed? This condition is diagnosed with a physical exam. Your health care provider may take a scraping or clipping from your nail to test for the fungus. How is this treated? Mild infections do not need treatment. If you have significant  nail changes, treatment may include:  Oral antifungal medicines. You may need to take the medicine for several weeks or several months, and you may not see the results for a long time. These medicines can cause side effects. Ask your health care provider what problems to watch for.  Antifungal nail polish and nail cream. These may be used along with oral antifungal medicines.  Laser treatment of the nail.  Surgery to remove the nail. This may be needed for the most severe infections.  Treatment takes a long time, and the infection may come back. Follow these instructions at home: Medicines  Take or apply over-the-counter and prescription medicines only as told by your health care provider.  Ask your health care provider about using over-the-counter mentholated ointment on your nails. Lifestyle   Do not share personal items, such as towels or nail clippers.  Trim your nails often.  Wash and dry your hands and feet every day.  Wear absorbent socks, and change your socks frequently.  Wear shoes that allow air to circulate, such as sandals or canvas tennis shoes. Throw out old shoes.  Wear rubber gloves if you are working with your hands in wet areas.  Do not walk barefoot in shower rooms or locker rooms.  Do not use a nail salon that does not use clean instruments.  Do not use artificial nails. General instructions  Keep all follow-up visits as told by your health care provider. This is  important.  Use antifungal foot powder on your feet and in your shoes. Contact a health care provider if: Your infection is not getting better or it is getting worse after several months. This information is not intended to replace advice given to you by your health care provider. Make sure you discuss any questions you have with your health care provider. Document Released: 03/25/2000 Document Revised: 09/03/2015 Document Reviewed: 09/29/2014 Elsevier Interactive Patient Education  [employer].   If you have lab work done today you will be contacted with your lab results within the next 2 weeks.  If you have not heard from [place] then please contact us. The fastest way to get your results is to register for My Chart.   IF you received an x-ray today, you will receive an invoice from [facility]. Please contact [facility] at [phone] with questions or concerns regarding your invoice.   IF you received labwork today, you will receive an invoice from [facility]. Please contact [facility] at [phone] with questions or concerns regarding your invoice.   Our billing staff will not be able to assist you with questions regarding bills from these companies.  You will be contacted with the lab results as soon as they are available. The fastest way to get your results is to activate your My Chart account. Instructions are located on the last page of this paperwork. If you have not heard from [place] regarding the results in 2 weeks, please contact this office.

## 2017-12-14 NOTE — Progress Notes (Addendum)
Subjective:    Patient ID: David [name], male    DOB: 17-Dec-1945, 72 y.o.   MRN: [id number]  HPI [name] David is a 72 y.o. male Presents today for: Chief Complaint  Patient presents with  . ingrown toenail both great toe toenails   Presents for concerns of great toenails.  Has had thickened toenails, curving of toenails for most of his life. Has had difficulty cutting great toenails.  Slight discomfort at the end of his right great toenail without discharge, or acute changes recently.  Has not seen podiatrist previously    Patient Active Problem List   Diagnosis Date Noted  . Major depressive disorder, recurrent episode, severe ([place]) 10/28/2017  . Generalized anxiety disorder 10/28/2017   Past Medical History:  Diagnosis Date  . Anxiety   . Depression   . Hyperlipidemia   . Hypertension   . Sleep apnea    CPAP   Past Surgical History:  Procedure Laterality Date  . CATARACT EXTRACTION, BILATERAL  2018  . EXTERNAL EAR SURGERY     child  . EYE SURGERY Bilateral    cataract  . right knee meniscus  06/12/2008  . right talus repair     dislocated   Allergies  Allergen Reactions  . Lamictal [Lamotrigine]   . Wellbutrin [Bupropion]    Prior to Admission medications   Medication Sig Start Date End Date Taking? Authorizing Provider  ALPRAZolam Duanne Moron) 0.5 MG tablet Take 0.5 mg by mouth 2 (two) times daily as needed for anxiety. At bedtime as needed 11/05/16  Yes [provider]  sertraline (ZOLOFT) 100 MG tablet Take 200 mg by mouth daily.   Yes [provider]  simvastatin (ZOCOR) 40 MG tablet Take 40 mg by mouth every evening.   Yes [provider]  Tamsulosin HCl (FLOMAX) 0.4 MG CAPS Take 1 mg by mouth daily.   Yes [provider]   Social History   Socioeconomic History  . Marital status: Legally Separated    Spouse name: [name]  . Number of children: 2  . Years of education: 59  . 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 Financial resource strain: Not on file  . Food insecurity:    Worry: Not on file    Inability: Not on file  . Transportation needs:    Medical: Not on file    Non-medical: Not on file  Tobacco Use  . Smoking status: Never Smoker  . Smokeless tobacco: Never Used  Substance and Sexual Activity  . Alcohol use: Yes    Comment: Vodka occasionally, not daily  . Drug use: Yes    Types: Marijuana    Comment: Occasional  . Sexual activity: Never  Lifestyle  . Physical activity:    Days per week: Not on file    Minutes per session: Not on file  . Stress: Not on file  Relationships  . Social connections:    Talks on phone: Not on file    Gets together: Not on file    Attends religious service: Not on file    Active member of club or organization: Not on file    Attends meetings of clubs or organizations: Not on file    Relationship status: Not on file  . Intimate partner violence:    Fear of current or ex partner: Not on file    Emotionally abused: Not on file  Physically abused: Not on file    Forced sexual activity: Not on file  Other Topics Concern  . Not on file  Social History Narrative  . Not on file    Review of Systems  Constitutional: Negative for fever.  Skin: Negative for rash and wound.       Objective:   Physical Exam  Constitutional: He is oriented to person, place, and time. He appears well-developed and well-nourished. No distress.  HENT:  Head: Normocephalic and atraumatic.  Cardiovascular: Normal rate.  Pulmonary/Chest: Effort normal.  Musculoskeletal:       Feet:  Neurological: He is alert and oriented to person, place, and time.  Psychiatric: He has a normal mood and affect.   Vitals:   12/14/17 1332  BP: 132/83  Pulse: 66  Resp: 17  Temp: (!) 97.5 F (36.4 C)  TempSrc: Oral  SpO2: 97%  Weight: 231 lb 6.4 oz (105 kg)  Height: 6\' 2"  (1.88 m)       Assessment  & Plan:   David [name] is a 72 y.o. male Onychomycosis - Plan: Ambulatory referral to Podiatry  Nail deformity - Plan: Ambulatory referral to Podiatry  Multiple nails both feet with onychomycosis, but great toenails with significant thickening, twisting and would be difficult to try to trim those in our office.  I do not see any active ingrown toenail or signs of infection at this time, but slight discomfort at the right distal toenail may have some pressure on surrounding skin.  Will refer to podiatry for ongoing nail care and possible trimming versus removal of great toenails.  RTC precautions if signs of infection prior or worsening discomfort.  No orders of the defined types were placed in this encounter.  Patient Instructions    I will refer you to podiatry for your nails. If any increased redness or swelling of skin beside the nail in the meantime, please return for recheck.   Return to the clinic or go to the nearest emergency room if any of your symptoms worsen or new symptoms occur.  Thank you for coming in today.    Fungal Nail Infection Fungal nail infection is a common fungal infection of the toenails or fingernails. This condition affects toenails more often than fingernails. More than one nail may be infected. The condition can be passed from person to person (is contagious). What are the causes? This condition is caused by a fungus. Several types of funguses can cause the infection. These funguses are common in moist and warm areas. If your hands or feet come into contact with the fungus, it may get into a crack in your fingernail or toenail and cause the infection. What increases the risk? The following factors may make you more likely to develop this condition:  Being male.  Having diabetes.  Being of older age.  Living with someone who has the fungus.  Walking barefoot in areas where the fungus thrives, such as showers or locker rooms.  Having poor  circulation.  Wearing shoes and socks that cause your feet to sweat.  Having athlete's foot.  Having a nail injury or history of a recent nail surgery.  Having psoriasis.  Having a weak body defense system (immune system).  What are the signs or symptoms? Symptoms of this condition include:  A pale spot on the nail.  Thickening of the nail.  A nail that becomes yellow or brown.  A brittle or ragged nail edge.  A crumbling nail.  A nail  that has lifted away from the nail bed.  How is this diagnosed? This condition is diagnosed with a physical exam. Your health care provider may take a scraping or clipping from your nail to test for the fungus. How is this treated? Mild infections do not need treatment. If you have significant nail changes, treatment may include:  Oral antifungal medicines. You may need to take the medicine for several weeks or several months, and you may not see the results for a long time. These medicines can cause side effects. Ask your health care provider what problems to watch for.  Antifungal nail polish and nail cream. These may be used along with oral antifungal medicines.  Laser treatment of the nail.  Surgery to remove the nail. This may be needed for the most severe infections.  Treatment takes a long time, and the infection may come back. Follow these instructions at home: Medicines  Take or apply over-the-counter and prescription medicines only as told by your health care provider.  Ask your health care provider about using over-the-counter mentholated ointment on your nails. Lifestyle   Do not share personal items, such as towels or nail clippers.  Trim your nails often.  Wash and dry your hands and feet every day.  Wear absorbent socks, and change your socks frequently.  Wear shoes that allow air to circulate, such as sandals or canvas tennis shoes. Throw out old shoes.  Wear rubber gloves if you are working with your hands in  wet areas.  Do not walk barefoot in shower rooms or locker rooms.  Do not use a nail salon that does not use clean instruments.  Do not use artificial nails. General instructions  Keep all follow-up visits as told by your health care provider. This is important.  Use antifungal foot powder on your feet and in your shoes. Contact a health care provider if: Your infection is not getting better or it is getting worse after several months. This information is not intended to replace advice given to you by your health care provider. Make sure you discuss any questions you have with your health care provider. Document Released: 03/25/2000 Document Revised: 09/03/2015 Document Reviewed: 09/29/2014 Elsevier Interactive Patient Education  [employer].   If you have lab work done today you will be contacted with your lab results within the next 2 weeks.  If you have not heard from [place] then please contact us. The fastest way to get your results is to register for My Chart.   IF you received an x-ray today, you will receive an invoice from [facility]. Please contact [facility] at [phone] with questions or concerns regarding your invoice.   IF you received labwork today, you will receive an invoice from [place]. Please contact [facility] at [phone] with questions or concerns regarding your invoice.   Our billing staff will not be able to assist you with questions regarding bills from these companies.  You will be contacted with the lab results as soon as they are available. The fastest way to get your results is to activate your My Chart account. Instructions are located on the last page of this paperwork. If you have not heard from [place] regarding the results in 2 weeks, please contact this office.      Signed,   Merri Ray, MD [facility] [place].  12/14/17 2:01 PM

## 2017-12-18 ENCOUNTER — Encounter: Payer: Medicare Other | Admitting: Podiatry

## 2017-12-20 NOTE — Progress Notes (Signed)
This encounter was created in error - please disregard.

## 2018-01-01 ENCOUNTER — Encounter: Payer: Self-pay | Admitting: Diagnostic Neuroimaging

## 2018-01-01 ENCOUNTER — Ambulatory Visit: Payer: Medicare Other | Admitting: Diagnostic Neuroimaging

## 2018-01-01 ENCOUNTER — Telehealth: Payer: Self-pay | Admitting: Diagnostic Neuroimaging

## 2018-01-01 ENCOUNTER — Encounter: Payer: Self-pay | Admitting: Podiatry

## 2018-01-01 ENCOUNTER — Ambulatory Visit: Payer: Medicare Other | Admitting: Podiatry

## 2018-01-01 ENCOUNTER — Encounter

## 2018-01-01 VITALS — BP 129/80 | HR 62 | Ht 74.0 in | Wt 239.0 lb

## 2018-01-01 VITALS — BP 114/64 | HR 84

## 2018-01-01 DIAGNOSIS — M79676 Pain in unspecified toe(s): Secondary | ICD-10-CM | POA: Diagnosis not present

## 2018-01-01 DIAGNOSIS — F0390 Unspecified dementia without behavioral disturbance: Secondary | ICD-10-CM | POA: Diagnosis not present

## 2018-01-01 DIAGNOSIS — F32A Depression, unspecified: Secondary | ICD-10-CM

## 2018-01-01 DIAGNOSIS — B351 Tinea unguium: Secondary | ICD-10-CM | POA: Diagnosis not present

## 2018-01-01 DIAGNOSIS — F03A0 Unspecified dementia, mild, without behavioral disturbance, psychotic disturbance, mood disturbance, and anxiety: Secondary | ICD-10-CM

## 2018-01-01 DIAGNOSIS — F419 Anxiety disorder, unspecified: Secondary | ICD-10-CM | POA: Diagnosis not present

## 2018-01-01 DIAGNOSIS — F329 Major depressive disorder, single episode, unspecified: Secondary | ICD-10-CM | POA: Diagnosis not present

## 2018-01-01 DIAGNOSIS — G4733 Obstructive sleep apnea (adult) (pediatric): Secondary | ICD-10-CM

## 2018-01-01 MED ORDER — MEMANTINE HCL 10 MG PO TABS: 10.0000 mg | ORAL_TABLET | Freq: Two times a day (BID) | ORAL | 12 refills | Status: DC

## 2018-01-01 NOTE — Telephone Encounter (Signed)
Patient states he will call back to schedule 6 mo f/u w/ [name]

## 2018-01-01 NOTE — Patient Instructions (Addendum)
Thank you for coming to see [place] at [facility]. I hope we have been able to provide you high quality care today.  You may receive a patient satisfaction survey over the next few weeks. We would appreciate your feedback and comments so that we may continue to improve ourselves and the health of our patients.   MEMORY LOSS - start memantine 70m daily x 1-2 weeks, then 127mtwice a day  - repeat neuropsychology testing  ANXIETY/DEPRESSION - follow up with psychiatry    ~~~~~~~~~~~~~~~~~~~~~~~~~~~~~~~~~~~~~~~~~~~~~~~~~~~~~~~~~~~~~~~~~  DR. Steffi Noviello'S GUIDE TO HAPPY AND HEALTHY LIVING These are some of my general health and wellness recommendations. Some of them may apply to you better than others. Please use common sense as you try these suggestions and feel free to ask me any questions.   ACTIVITY/FITNESS Mental, social, emotional and physical stimulation are very important for brain and body health. Try learning a new activity (arts, music, language, sports, games).  Keep moving your body to the best of your abilities. You can do this at home, inside or outside, the park, community center, gym or anywhere you like. Consider a physical therapist or personal trainer to get started. Fitness trackers, smart-watches or  smart-phones can help as well.   NUTRITION Eat more plants: colorful vegetables, nuts, seeds and berries.  Eat less sugar, salt, preservatives and processed foods.  Avoid toxins such as cigarettes and alcohol.  Drink water when you are thirsty. Warm water with a slice of lemon is an excellent morning drink to start the day.  Consider these websites for more information The Nutrition Source (hthttps://www.henry-hernandez.biz/[employer] Nutrition (wwWindowBlog.ch  RELAXATION Consider practicing mindfulness meditation or other relaxation techniques such as deep breathing, prayer, yoga, tai chi, massage. See  website mindful.org or the apps Headspace or Calm to help get started.   SLEEP Try to get at least 7-8+ hours sleep per day. Regular exercise and reduced caffeine will help you sleep better. Practice good sleep hygeine techniques. See website sleep.org for more information.   PLANNING Prepare estate planning, living will, healthcare POA documents. Sometimes this is best planned with the help of an attorney. Theconversationproject.org and agingwithdignity.org are excellent resources.

## 2018-01-01 NOTE — Progress Notes (Signed)
[facility]  PATIENT: David [name] DOB: [birth year]  REFERRING CLINICIAN: Rankins  HISTORY FROM: patient and wife  REASON FOR VISIT: follow up   HISTORICAL  CHIEF COMPLAINT:  Chief Complaint  Patient presents with  . Follow-up    Rm 6 alone, wife to come.    . Memory Loss    f/u memory.  ( pt find ST unreliable).  Tried cpap, could not do it.  Seen Neuropsych 02/2017.    HISTORY OF PRESENT ILLNESS:   UPDATE (01/01/18, VRP): Since last visit, more progression of symptoms. Symptoms are moderate to severe. Mood lability is significant. No alleviating or aggravating factors. Tolerating meds. Now on rexulti for anxiety.   UPDATE (11/06/17, VRP, phone note): I spoke with patient's wife (30 minutes).  Patient having progressive short-term memory loss, confusion, sundowning, depression, anxiety, hopelessness, suicidal ideation.  MRI of the brain on 10/27/2017 shows significant temporal and parietal atrophy.  Constellation of symptoms concerning for neurodegenerative dementia.  Underlying depression and anxiety also significant factor. Patient living alone.  Patient and wife have separated since 2012.  Patient wife is healthcare power of attorney.  She is under significant caregiver stress. They have not had good experience with his current psychiatrist and are looking to establish with a new team.  PLAN: - Repeat neuropsychological testing with Dr. 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 Anxiety also worsening. Diff with tax returns. Diff remembering if he took his meds. More suicidal thoughts, but able to contract for  safety. Seeing [facility] for psychiatry / psychology.  UPDATE 11/12/15: Since last visit, memory loss is stable. Had sleep study, now dx'd with OSA, and recommended to have CPAP (pt not started yet, due to finances). Still with sig family stress (wife, daughter, son).   UPDATE 05/13/15: Since last visit, symptoms continue, esp significant depression and stress related to breakup of his marriage. Noted some stuttering at a family event recently. Some more dizziness spells when standing up.   NEW HPI (02/16/15, VRP): Since last visit, has had continued significant psycho-social stress (separated from wife 2012, autistic daughter at home, son is living with alcoholic and raising a baby). Still with significant short term memory. Reports sleep apnea diagnosis many years ago (> 3yrs) and did not tolerate CPAP. Patient was a former Engineer, manufacturing systems, and has noted some mild smell loss.   PRIOR HPI (12/09/09, VRP): 72 year old right-handed male with history of anxiety, hypercholesterolemia and family history of Alzheimer's disease presenting for evaluation of mild memory difficulty. Over the past one year patient is noticed some mild difficulty with short-term memory. He gives examples of having mild difficulty with certain tasks and forgetting steps of sequence of events. He may be in one room, think of getting something from a different room, and while he is walking over to the next room, he forgets what he wanted to get. He still remains physically and mentally active. 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  Has a long history of depression and anxiety and is being treated with  combination of medications and therapy. He is quite aware of the limitations of treatment for prevention of Alzheimer's disease. However he is hoping to gain some insight into his likelihood of  developing Alzheimer's disease so that he can better plan for the future in terms of his family his needs.   REVIEW OF SYSTEMS: Full 14 system review of systems performed and negative except: memory loss confusion anxiety suicidal thoughts snoring.    ALLERGIES: Allergies  Allergen Reactions  . Lamictal [Lamotrigine]   . Wellbutrin [Bupropion]     HOME MEDICATIONS: Outpatient Medications Prior to Visit  Medication Sig Dispense Refill  . ALPRAZolam (XANAX) 0.5 MG tablet Take 0.5 mg by mouth 2 (two) times daily as needed for anxiety. At bedtime as needed    . Brexpiprazole (REXULTI) 1 MG TABS Take 1 mg by mouth at bedtime.     . Ergocalciferol (VITAMIN D2 PO) Take 1.25 mg by mouth. Once weekly    . sertraline (ZOLOFT) 100 MG tablet Take 200 mg by mouth daily.    . simvastatin (ZOCOR) 40 MG tablet Take 40 mg by mouth every evening.    . Tamsulosin HCl (FLOMAX) 0.4 MG CAPS Take 1 mg by mouth daily.     No facility-administered medications prior to visit.     PAST MEDICAL HISTORY: Past Medical History:  Diagnosis Date  . Anxiety   . Depression   . Hyperlipidemia   . Hypertension   . Sleep apnea    CPAP    PAST SURGICAL HISTORY: Past Surgical History:  Procedure Laterality Date  . CATARACT EXTRACTION, BILATERAL  2018  . EXTERNAL EAR SURGERY     child  . EYE SURGERY Bilateral    cataract  . right knee meniscus  06/12/2008  . right talus repair     dislocated    FAMILY HISTORY: Family History  Problem Relation Age of Onset  . Alzheimer's disease Mother   . Cancer Father        prostate  . Thyroid disease Father   . Alzheimer's disease Maternal Grandmother   . Heart disease Maternal Grandfather   . Stroke Paternal Grandmother   . Stroke Paternal Grandfather   . Alcoholism Brother   . Autism Daughter   . Autism Maternal Uncle     SOCIAL HISTORY:  Social History   Socioeconomic History  . Marital status: Legally Separated    Spouse name: [name]  . Number of  children: 2  . Years of education: 57  . 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 Financial resource strain: Not on file  . Food insecurity:    Worry: Not on file    Inability: Not on file  . Transportation needs:    Medical: Not on file    Non-medical: Not on file  Tobacco Use  . Smoking status: Never Smoker  . Smokeless tobacco: Never Used  Substance and Sexual Activity  . Alcohol use: Yes    Comment: 1-2 shots 5-7 x week  . Drug use: Yes    Types: Marijuana    Comment: Occasional  . Sexual activity: Never  Lifestyle  . Physical activity:    Days per week: Not on file    Minutes per session: Not on file  . Stress: Not on file  Relationships  . Social connections:    Talks on phone: Not on file    Gets together: Not on  file    Attends religious service: Not on file    Active member of club or organization: Not on file    Attends meetings of clubs or organizations: Not on file    Relationship status: Not on file  . Intimate partner violence:    Fear of current or ex partner: Not on file    Emotionally abused: Not on file    Physically abused: Not on file    Forced sexual activity: Not on file  Other Topics Concern  . Not on file  Social History Narrative   Lives alone retired.  Education: BS, MS Database administrator.  Is separated.  Children son, daughter, 2 gks, mother who is 86yr.  Cokes 0-3 daily.     PHYSICAL EXAM  GENERAL EXAM/CONSTITUTIONAL: Vitals:  Vitals:   01/01/18 0828  BP: 129/80  Pulse: 62  Weight: 239 lb (108.4 kg)  Height: 6\' 2"  (1.88 m)   Wt Readings from Last 3 Encounters:  01/01/18 239 lb (108.4 kg)  12/14/17 231 lb 6.4 oz (105 kg)  10/27/17 223 lb (101.2 kg)    Body mass index is 30.69 kg/m. No exam data present  Patient is in no distress; well developed, nourished and groomed; neck is supple  CARDIOVASCULAR:  Examination of carotid arteries is normal; no carotid  bruits  Regular rate and rhythm, no murmurs  Examination of peripheral vascular system by observation and palpation is normal   MUSCULOSKELETAL:  Gait, strength, tone, movements noted in Neurologic exam below  NEUROLOGIC: MENTAL STATUS:  MMSE - Mini Mental State Exam 01/01/2018 11/14/2016 11/12/2015  Orientation to time 3 4 5   Orientation to Place 5 5 5   Registration 3 3 3   Attention/ Calculation 4 5 5   Recall 1 2 3   Language- name 2 objects 2 2 2   Language- repeat 1 1 1   Language- follow 3 step command 3 3 3   Language- read & follow direction 1 1 1   Write a sentence 1 1 1   Copy design 1 1 1   Total score 25 28 30     awake, alert, oriented to person, place and time  recent and remote memory intact  normal attention and concentration  language fluent, comprehension intact, naming intact,   fund of knowledge appropriate  CRANIAL NERVE:   2nd, 3rd, 4th, 6th - pupils equal and reactive to light, visual fields full to confrontation, extraocular muscles intact, no nystagmus  5th - facial sensation symmetric  7th - facial strength symmetric  8th - hearing intact  9th - palate elevates symmetrically, uvula midline  11th - shoulder shrug symmetric  12th - tongue protrusion midline  MOTOR:   normal bulk and tone, full strength in the BUE, BLE  POSTURAL TREMOR  SENSORY:   normal and symmetric to light touch, temperature, vibration  COORDINATION:   finger-nose-finger, fine finger movements normal  REFLEXES:   deep tendon reflexes SLIGHTLY BRISK and symmetric  GAIT/STATION:   narrow based gait; tandem stable; romberg is negative    DIAGNOSTIC DATA (LABS, IMAGING, TESTING) - I reviewed patient records, labs, notes, testing and imaging myself where available.  Lab Results  Component Value Date   WBC 8.3 10/27/2017   HGB 15.8 10/27/2017   HCT 45.8 10/27/2017   MCV 92.2 10/27/2017   PLT 191 10/27/2017      Component Value Date/Time   NA 143  10/27/2017 1858   K 4.0 10/27/2017 1858   CL 110 10/27/2017 1858   CO2 24 10/27/2017 1858  GLUCOSE 85 10/27/2017 1858   BUN 16 10/27/2017 1858   CREATININE 1.19 10/27/2017 1858   CALCIUM 9.4 10/27/2017 1858   PROT 6.7 10/27/2017 1858   ALBUMIN 4.2 10/27/2017 1858   AST 20 10/27/2017 1858   ALT 18 10/27/2017 1858   ALKPHOS 40 10/27/2017 1858   BILITOT 1.3 (H) 10/27/2017 1858   GFRNONAA 60 (L) 10/27/2017 1858   GFRAA >60 10/27/2017 1858   No results found for: CHOL, HDL, LDLCALC, LDLDIRECT, TRIG, CHOLHDL No results found for: HGBA1C Lab Results  Component Value Date   VITAMINB12 157 (L) 10/27/2017   Lab Results  Component Value Date   TSH 1.624 10/27/2017     12/16/09 MRI brain  - normal   02/15/10 & 02/19/10 Neuropsychology testing ([name]) - low average - average memory abilities; unexpectedly low given general cognitive function was in superior range - could be related to low B12 levels or high stress levels (anxiety) - possible very early indicator of neurodegenerative disorder  10/27/17 MRI brain [I reviewed images myself and agree with interpretation. Significant progression of atrophy since 2006 CT. -VRP]  1. No acute intracranial process. 2. 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    ASSESSMENT AND PLAN  72 y.o. year old male here with memory loss since 2010-2011. Family stress and depression and anxiety continue. Initially thought to represent pseudo-dementia of depression, but now with significant progression of symptoms and brain atrophy on MRI. 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    Dx: neurodegenerative dementia  Mild dementia  Depression, unspecified depression type  Anxiety  OSA (obstructive sleep apnea)     PLAN:  I spent 25 minutes of face to face time with patient. Greater than 50% of time was spent in counseling and  coordination of care with patient. This is necessary because patient's symptoms are not optimally controlled / improved. In summary we discussed: - Diagnostic results, impressions, or recommended diagnostic studies: dementia - Prognosis: fair - guarded  MEMORY LOSS - repeat neuropsychology testing - start memantine  ANXIETY/DEPRESSION - follow up with psychiatry  SLEEP APNEA - encourage CPAP usage; patient declines at this time  Meds ordered this encounter  Medications  . memantine (NAMENDA) 10 MG tablet    Sig: Take 1 tablet (10 mg total) by mouth 2 (two) times daily.    Dispense:  60 tablet    Refill:  12   Return in about 6 months (around 07/02/2018).    Penni Bombard, MD 0/10/6224, 3:33 AM Certified in Neurology, Neurophysiology and Neuroimaging  [facility] [address], [place], [place] [address] [phone]

## 2018-01-02 NOTE — Progress Notes (Signed)
   SUBJECTIVE Patient presents to office today complaining of elongated, thickened nails that cause pain while ambulating in shoes. He is unable to trim his own nails. Patient is here for further evaluation and treatment.  Past Medical History:  Diagnosis Date  . Anxiety   . Depression   . Hyperlipidemia   . Hypertension   . Sleep apnea    CPAP    OBJECTIVE General Patient is awake, alert, and oriented x 3 and in no acute distress. Derm Skin is dry and supple bilateral. Negative open lesions or macerations. Remaining integument unremarkable. Nails are tender, long, thickened and dystrophic with subungual debris, consistent with onychomycosis, 1-5 bilateral. No signs of infection noted. Vasc  DP and PT pedal pulses palpable bilaterally. Temperature gradient within normal limits.  Neuro Epicritic and protective threshold sensation grossly intact bilaterally.  Musculoskeletal Exam No symptomatic pedal deformities noted bilateral. Muscular strength within normal limits.  ASSESSMENT 1. Onychodystrophic nails 1-5 bilateral with hyperkeratosis of nails.  2. Onychomycosis of nail due to dermatophyte bilateral 3. Pain in foot bilateral  PLAN OF CARE 1. Patient evaluated today.  2. Instructed to maintain good pedal hygiene and foot care.  3. Mechanical debridement of nails 1-5 bilaterally performed using a nail nipper. Filed with dremel without incident.  4. Return to clinic in 3 mos.    Edrick Kins, DPM [facility]  Dr. Edrick Kins, [address]                                        [place], [place] [address]                Office [phone]  Fax [phone]

## 2018-01-04 ENCOUNTER — Telehealth: Payer: Self-pay | Admitting: *Deleted

## 2018-01-04 NOTE — Telephone Encounter (Signed)
Spoke to pt after calling [facility] about moving up appt for him per [name].    I relayed per there office they did not have any sooner availability, but he was on waitlist/cancellation list.  Pt verbalized understanding.

## 2018-01-05 ENCOUNTER — Encounter: Payer: Self-pay | Admitting: *Deleted

## 2018-01-10 ENCOUNTER — Telehealth: Payer: Self-pay | Admitting: Psychiatry

## 2018-01-10 NOTE — Telephone Encounter (Signed)
Pt made aware to pick up samples.

## 2018-01-10 NOTE — Telephone Encounter (Signed)
Pt has one pill left, needs samples of Rexulti 1mg , has ov on 10/04

## 2018-01-10 NOTE — Telephone Encounter (Signed)
Requesting more samples of starter pack Rexullti.

## 2018-01-12 ENCOUNTER — Ambulatory Visit: Payer: Medicare Other | Admitting: Psychiatry

## 2018-01-12 ENCOUNTER — Encounter: Payer: Self-pay | Admitting: Psychiatry

## 2018-01-12 VITALS — BP 122/70 | HR 69

## 2018-01-12 DIAGNOSIS — F39 Unspecified mood [affective] disorder: Secondary | ICD-10-CM | POA: Diagnosis not present

## 2018-01-12 DIAGNOSIS — F411 Generalized anxiety disorder: Secondary | ICD-10-CM

## 2018-01-12 MED ORDER — SERTRALINE HCL 100 MG PO TABS: 200.0000 mg | ORAL_TABLET | Freq: Every day | ORAL | 1 refills | Status: DC

## 2018-01-12 MED ORDER — BREXPIPRAZOLE 1 MG PO TABS: 1.0000 mg | ORAL_TABLET | Freq: Every day | ORAL | 0 refills | Status: DC

## 2018-01-12 NOTE — Progress Notes (Signed)
David [name] [id number] [birth year] 72 y.o.  Subjective:   Patient ID:  David [name] is a 73 y.o. (DOB [birth year]) male.  Chief Complaint:  Chief Complaint  Patient presents with  . Anxiety  . Depression    HPI [name] presents to the office today for follow-up of mood and anxiety. "I think the Rexulti has been beneficial." He reports "more restful sleep...still not as long as would be ideal." Reports that a new device has also been helping with his sleep apnea. Reports difficulty "shutting down the internal dialogue." He reports that he continues to misplace objects and lose track of things, and this is concerning to him and creates anxiety. Continues to use notes to help compensate for difficulty with recall. "I'm anxious short term." Continued anxious thoughts and worry. Reports some decrease in panic attacks.  Continues to describe some disorientation. Reports continued depression. Reports some improvement in his mood with less depression. Reports some irritability and that it is typically as a result of "provocation." No change in appetite and he reports that his weight has remained stable. Reports energy and motivation remain lower than he would like. Reports "amplitude of mood has been decreased" however frequency has not changed. Reports vague, chronic SI without intent.    Reports that his daughter recently competed and won in the [employer].  Medications: I have reviewed the patient's current medications. Denies any side effects.   Allergies:  Allergies  Allergen Reactions  . Lamictal [Lamotrigine]   . Wellbutrin [Bupropion]     Past Medical History:  Diagnosis Date  . Anxiety   . Depression   . Hyperlipidemia   . Hypertension   . Sleep apnea    CPAP    Past Surgical History:  Procedure Laterality Date  . CATARACT EXTRACTION, BILATERAL  2018  . EXTERNAL EAR SURGERY     child  . EYE SURGERY Bilateral    cataract  . right knee meniscus  06/12/2008   . right talus repair     dislocated    Family History  Problem Relation Age of Onset  . Alzheimer's disease Mother   . Depression Mother   . Cancer Father        prostate  . Thyroid disease Father   . Alzheimer's disease Maternal Grandmother   . Heart disease Maternal Grandfather   . Stroke Paternal Grandmother   . Stroke Paternal Grandfather   . Alcoholism Brother   . Autism Daughter   . Autism Maternal Uncle     Social History   Socioeconomic History  . Marital status: Legally Separated    Spouse name: [name]  . Number of children: 2  . Years of education: 69  . 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 Financial resource strain: Not on file  . Food insecurity:    Worry: Not on file    Inability: Not on file  . Transportation needs:    Medical: Not on file    Non-medical: Not on file  Tobacco Use  . Smoking status: Never Smoker  . Smokeless tobacco: Never Used  Substance and Sexual Activity  . Alcohol use: Yes    Comment: 1-2 shots 5-7 x week  . Drug use: Yes    Types: Marijuana    Comment: Occasional  . Sexual activity: Never  Lifestyle  . Physical activity:    Days per week: Not on file  Minutes per session: Not on file  . Stress: Not on file  Relationships  . Social connections:    Talks on phone: Not on file    Gets together: Not on file    Attends religious service: Not on file    Active member of club or organization: Not on file    Attends meetings of clubs or organizations: Not on file    Relationship status: Not on file  . Intimate partner violence:    Fear of current or ex partner: Not on file    Emotionally abused: Not on file    Physically abused: Not on file    Forced sexual activity: Not on file  Other Topics Concern  . Not on file  Social History Narrative   Lives alone retired.  Education: BS, MS Database administrator.  Is separated.  Children son, daughter, 2 gks,  mother who is 52yr.  Cokes 0-3 daily.    Past Medical History, Surgical history, Social history, and Family history were reviewed and updated as appropriate.   Please see review of systems for further details on the patient's review from today.   Review of Systems:  Review of Systems  Gastrointestinal: Negative.   Musculoskeletal: Negative for gait problem.  Neurological: Negative for tremors.    Objective:   Physical Exam:  BP 122/70   Pulse 69   Physical Exam  Constitutional: He appears well-developed. No distress.  Musculoskeletal: Normal range of motion.  Neurological: He is alert. Coordination normal.  Psychiatric: His mood appears anxious. His affect is not angry, not blunt, not labile and not inappropriate. His speech is rapid and/or pressured. He is hyperactive. Cognition and memory are impaired. He expresses inappropriate judgment. He does not exhibit a depressed mood. He expresses no homicidal and no suicidal ideation. He expresses no suicidal plans and no homicidal plans. He exhibits abnormal recent memory.  Pt does not recall sharing picture of his daughter at beginning of exam and shares the picture again. Less tangential compared to initial evaluation.     Lab Review:     Component Value Date/Time   NA 143 10/27/2017 1858   K 4.0 10/27/2017 1858   CL 110 10/27/2017 1858   CO2 24 10/27/2017 1858   GLUCOSE 85 10/27/2017 1858   BUN 16 10/27/2017 1858   CREATININE 1.19 10/27/2017 1858   CALCIUM 9.4 10/27/2017 1858   PROT 6.7 10/27/2017 1858   ALBUMIN 4.2 10/27/2017 1858   AST 20 10/27/2017 1858   ALT 18 10/27/2017 1858   ALKPHOS 40 10/27/2017 1858   BILITOT 1.3 (H) 10/27/2017 1858   GFRNONAA 60 (L) 10/27/2017 1858   GFRAA >60 10/27/2017 1858       Component Value Date/Time   WBC 8.3 10/27/2017 1858   RBC 4.97 10/27/2017 1858   HGB 15.8 10/27/2017 1858   HCT 45.8 10/27/2017 1858   PLT 191 10/27/2017 1858   MCV 92.2 10/27/2017 1858   MCH 31.8  10/27/2017 1858   MCHC 34.5 10/27/2017 1858   RDW 13.1 10/27/2017 1858     Assessment: Plan:   Patient seen for 30 minutes and greater than 50% of exam spent counseling patient and coordinating plan of care to include reviewing neurology notes at time of exam and discussing plan of care with patient.  Patient reports that he plans on starting memantine soon once it is approved by insurance, and would prefer not to increase dose of Rexulti or make any changes at this time in order  to evaluate response to start of memantine.  Agree with this plan.  Discussed that prior authorization may be required for Rexulti and provided patient with samples.  Patient to follow-up in 3 months or sooner if clinically indicated. Mood disorder ([place]) - Plan: Brexpiprazole (REXULTI) 1 MG TABS, sertraline (ZOLOFT) 100 MG tablet  Generalized anxiety disorder - Plan: Brexpiprazole (REXULTI) 1 MG TABS, sertraline (ZOLOFT) 100 MG tablet  Please see After Visit Summary for patient specific instructions.  Future Appointments  Date Time Provider [place]  01/23/2018  5:30 PM Doree Fudge, PhD [facility] None  04/16/2018 10:30 AM [name], PMHNP [facility] None  05/02/2018  1:15 PM Edrick Kins, DPM [facility]  05/14/2018  9:00 AM Kandis Nab, PsyD [facility] None  05/14/2018 10:00 AM [facility] None    No orders of the defined types were placed in this encounter.     -------------------------------

## 2018-01-23 ENCOUNTER — Ambulatory Visit (INDEPENDENT_AMBULATORY_CARE_PROVIDER_SITE_OTHER): Payer: Medicare Other | Admitting: Psychology

## 2018-01-23 DIAGNOSIS — F330 Major depressive disorder, recurrent, mild: Secondary | ICD-10-CM

## 2018-03-05 ENCOUNTER — Ambulatory Visit: Payer: Medicare Other | Admitting: Diagnostic Neuroimaging

## 2018-04-16 ENCOUNTER — Ambulatory Visit: Payer: Medicare Other | Admitting: Psychiatry

## 2018-04-17 ENCOUNTER — Telehealth: Payer: Self-pay | Admitting: Psychiatry

## 2018-04-17 NOTE — Telephone Encounter (Signed)
Pt. Came in today to let you know that he is having a lot of confusion disoriented and anxiety. He thinks this is from the rexulti and menportin. In fact he didn't take the second med right at first only one tab a day instead of two a day. Has been taking correct now but wants to talk to him [phone]

## 2018-04-18 NOTE — Telephone Encounter (Signed)
FYI  Pt scheduled for In the morning at 8:30 am

## 2018-04-19 ENCOUNTER — Encounter: Payer: Self-pay | Admitting: Psychiatry

## 2018-04-19 ENCOUNTER — Ambulatory Visit: Payer: Medicare Other | Admitting: Psychiatry

## 2018-04-19 VITALS — BP 138/72 | HR 60

## 2018-04-19 DIAGNOSIS — F411 Generalized anxiety disorder: Secondary | ICD-10-CM | POA: Diagnosis not present

## 2018-04-19 DIAGNOSIS — F39 Unspecified mood [affective] disorder: Secondary | ICD-10-CM

## 2018-04-19 MED ORDER — BREXPIPRAZOLE 2 MG PO TABS: 2.0000 mg | ORAL_TABLET | Freq: Every day | ORAL | 0 refills | Status: DC

## 2018-04-19 MED ORDER — ALPRAZOLAM 0.5 MG PO TABS: 0.5000 mg | ORAL_TABLET | Freq: Two times a day (BID) | ORAL | 2 refills | Status: DC | PRN

## 2018-04-19 NOTE — Patient Instructions (Signed)
Recommend making an apt with Dr. Rexene Edison.

## 2018-04-19 NOTE — Progress Notes (Signed)
David [name] [id number] [birth year] 73 y.o.  Subjective:   Patient ID:  David [name] is a 73 y.o. (DOB [birth year]) male.  Chief Complaint:  Chief Complaint  Patient presents with  . Anxiety  . Other    Impaired concentration    HPI David [name] presents to the office today for follow-up of anxiety, mood, and memory impairment. He reports that he has been having "a lot of anxiety and increased confusion." He reports that he now realizes he was taking the incorrect dosage of Memantine. He reports sometimes that he has been taking two Memantine 10 mg tabs at the same time once daily. He reports that he then started to divide the doses. He reports "a lot of stress" and yesterday spent the day looking for group home placement for his 68 yo daughter with autism. Reports that his 38 yo mother has also been having issues. He reports that he has been making more mistakes with finances and medications than usual. Reports that he frequently will forget what he got up to do. Reports that missing appointments is uncharacteristic for him. He reports that mood has been depressed. He reports that he has been irritable and impatient. He reports passive death wishes- "I feel like I am reaching the limit with what I can cope." He describes sleep as "episodic." Reports that he is a "night person and usually don't go to bed until 1-2 am." reports that he then will sleep about 6 hours a night on average and occasionally nap. Denies any recent change in appetite. He reports that he has had some weight gain and attributes this to decreased exercise. He reports energy is adequate and is able to exercise intensely when he gets to the gym. He reports that sometimes he has "fear of going out and doing anything at times."   Review of Systems:  Review of Systems  Musculoskeletal: Negative for gait problem.  Neurological: Negative for tremors.  Psychiatric/Behavioral:       Please refer to HPI    Medications: I have  reviewed the patient's current medications.  Current Outpatient Medications  Medication Sig Dispense Refill  . ALPRAZolam (XANAX) 0.5 MG tablet Take 1 tablet (0.5 mg total) by mouth 2 (two) times daily as needed for anxiety. At bedtime as needed 60 tablet 2  . Ergocalciferol (VITAMIN D2 PO) Take 1.25 mg by mouth every 30 (thirty) days. Once weekly     . memantine (NAMENDA) 10 MG tablet Take 1 tablet (10 mg total) by mouth 2 (two) times daily. 60 tablet 12  . sertraline (ZOLOFT) 100 MG tablet Take 200 mg by mouth daily.    . simvastatin (ZOCOR) 40 MG tablet Take 40 mg by mouth every evening.    . Tamsulosin HCl (FLOMAX) 0.4 MG CAPS Take 1 mg by mouth daily.    . Brexpiprazole (REXULTI) 2 MG TABS Take 2 mg by mouth daily for 28 days. 28 tablet 0  . Brexpiprazole (REXULTI) 2 MG TABS Take 2 mg by mouth daily. 90 tablet 0  . sertraline (ZOLOFT) 100 MG tablet Take 2 tablets (200 mg total) by mouth daily. 180 tablet 1   No current facility-administered medications for this visit.     Medication Side Effects: None  Allergies:  Allergies  Allergen Reactions  . Lamictal [Lamotrigine]   . Wellbutrin [Bupropion]     Past Medical History:  Diagnosis Date  . Anxiety   . Depression   . Hyperlipidemia   . Hypertension   . Sleep  apnea    CPAP    Family History  Problem Relation Age of Onset  . Alzheimer's disease Mother   . Depression Mother   . Cancer Father        prostate  . Thyroid disease Father   . Alzheimer's disease Maternal Grandmother   . Heart disease Maternal Grandfather   . Stroke Paternal Grandmother   . Stroke Paternal Grandfather   . Alcoholism Brother   . Autism Daughter   . Autism Maternal Uncle     Social History   Socioeconomic History  . Marital status: Legally Separated    Spouse name: [name]  . Number of children: 2  . Years of education: 34  . 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 Financial resource strain: Not on file  . Food insecurity:    Worry: Not on file    Inability: Not on file  . Transportation needs:    Medical: Not on file    Non-medical: Not on file  Tobacco Use  . Smoking status: Never Smoker  . Smokeless tobacco: Never Used  Substance and Sexual Activity  . Alcohol use: Yes    Comment: 1-2 shots 5-7 x week  . Drug use: Yes    Types: Marijuana    Comment: Occasional  . Sexual activity: Never  Lifestyle  . Physical activity:    Days per week: Not on file    Minutes per session: Not on file  . Stress: Not on file  Relationships  . Social connections:    Talks on phone: Not on file    Gets together: Not on file    Attends religious service: Not on file    Active member of club or organization: Not on file    Attends meetings of clubs or organizations: Not on file    Relationship status: Not on file  . Intimate partner violence:    Fear of current or ex partner: Not on file    Emotionally abused: Not on file    Physically abused: Not on file    Forced sexual activity: Not on file  Other Topics Concern  . Not on file  Social History Narrative   Lives alone retired.  Education: BS, MS Database administrator.  Is separated.  Children son, daughter, 2 gks, mother who is 23yr.  Cokes 0-3 daily.    Past Medical History, Surgical history, Social history, and Family history were reviewed and updated as appropriate.   Please see review of systems for further details on the patient's review from today.   Objective:   Physical Exam:  BP 138/72   Pulse 60   Physical Exam Constitutional:      General: He is not in acute distress.    Appearance: He is well-developed.  Musculoskeletal:        General: No deformity.  Neurological:     Mental Status: He is alert and oriented to person, place, and time.     Coordination: Coordination normal.  Psychiatric:        Mood and Affect: Mood is anxious and depressed. Affect is not labile, blunt, angry or  inappropriate.        Speech: Speech normal.        Behavior: Behavior is cooperative.        Thought Content: Thought content normal. Thought content does not include homicidal or suicidal ideation. Thought content does not  include homicidal or suicidal plan.     Comments: Insight intact. No auditory or visual hallucinations. No delusions.      Lab Review:     Component Value Date/Time   NA 143 10/27/2017 1858   K 4.0 10/27/2017 1858   CL 110 10/27/2017 1858   CO2 24 10/27/2017 1858   GLUCOSE 85 10/27/2017 1858   BUN 16 10/27/2017 1858   CREATININE 1.19 10/27/2017 1858   CALCIUM 9.4 10/27/2017 1858   PROT 6.7 10/27/2017 1858   ALBUMIN 4.2 10/27/2017 1858   AST 20 10/27/2017 1858   ALT 18 10/27/2017 1858   ALKPHOS 40 10/27/2017 1858   BILITOT 1.3 (H) 10/27/2017 1858   GFRNONAA 60 (L) 10/27/2017 1858   GFRAA >60 10/27/2017 1858       Component Value Date/Time   WBC 8.3 10/27/2017 1858   RBC 4.97 10/27/2017 1858   HGB 15.8 10/27/2017 1858   HCT 45.8 10/27/2017 1858   PLT 191 10/27/2017 1858   MCV 92.2 10/27/2017 1858   MCH 31.8 10/27/2017 1858   MCHC 34.5 10/27/2017 1858   RDW 13.1 10/27/2017 1858    No results found for: POCLITH, LITHIUM   No results found for: PHENYTOIN, PHENOBARB, VALPROATE, CBMZ   .res Assessment: Plan:   Discussed potential benefits, risks, and side effects of increasing Rexulti to 2 mg daily to improve anxiety and also residual depressive signs and symptoms since patient has reported improved mood and anxiety with Rexulti 1 mg daily. Also encouraged patient to resume therapy since he attributes increased anxiety to stressor of finding group home placement for his adult daughter.  Patient reports that he will contact therapist to schedule an appointment. Continue sertraline 200 mg daily for depression and anxiety. Continue Xanax 0.5 mg twice daily as needed for anxiety. 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  Future Appointments  Date Time Provider [place]  05/02/2018  1:15 PM Edrick Kins, [place] [facility]  05/15/2018 10:00 AM [name], [facility] None    No orders of the defined types were placed in this encounter.     -------------------------------

## 2018-04-20 ENCOUNTER — Telehealth: Payer: Self-pay | Admitting: Psychiatry

## 2018-04-20 NOTE — Telephone Encounter (Signed)
Clarified namenda is bid and he was correct.

## 2018-04-20 NOTE — Telephone Encounter (Signed)
David [name] called confused about dosing and directions mostly on the memantine. Please call to clarify with him his medication regimen

## 2018-05-02 ENCOUNTER — Encounter: Payer: Medicare Other | Admitting: Podiatry

## 2018-05-14 ENCOUNTER — Encounter

## 2018-05-14 ENCOUNTER — Encounter: Payer: Self-pay | Admitting: Psychology

## 2018-05-15 ENCOUNTER — Ambulatory Visit: Payer: Medicare Other | Admitting: Psychiatry

## 2018-05-15 NOTE — Progress Notes (Signed)
This encounter was created in error - please disregard.

## 2018-05-17 ENCOUNTER — Ambulatory Visit: Payer: Medicare Other | Admitting: Psychiatry

## 2018-05-21 ENCOUNTER — Ambulatory Visit: Payer: Medicare Other | Admitting: Psychiatry

## 2018-05-21 ENCOUNTER — Encounter: Payer: Self-pay | Admitting: Psychiatry

## 2018-05-21 DIAGNOSIS — F411 Generalized anxiety disorder: Secondary | ICD-10-CM

## 2018-05-21 DIAGNOSIS — F39 Unspecified mood [affective] disorder: Secondary | ICD-10-CM | POA: Diagnosis not present

## 2018-05-21 MED ORDER — SERTRALINE HCL 100 MG PO TABS: 250.0000 mg | ORAL_TABLET | Freq: Every day | ORAL | 1 refills | Status: DC

## 2018-05-21 NOTE — Patient Instructions (Addendum)
Recommend scheduling an appointment with Pervis Hocking, PhD.   Stop Pettus.  Increase Sertraline to 250 mg (2.5 of the 100 mg tabs) daily to improve anxiety.   I will contact [name] about possibly changing Memantine to Memantine XR since once daily dosing may be helpful.

## 2018-05-21 NOTE — Progress Notes (Signed)
David [name] [id number] Jun 26, 1945 73 y.o.  Subjective:   Patient ID:  David [name] is a 73 y.o. (DOB 1945-07-04) male.  Chief Complaint:  Chief Complaint  Patient presents with  . Anxiety  . Depression    HPI David [name] presents to the office today for follow-up of mood instability and anxiety. He reports that he has been having some difficulty taking some medications consistently. He reports that he has had difficulty with taking Memantine BID. He reports that he also had confusion with taking Rexulti 1 mg two tabs until he had 2 mg tabs filled. Reports that he is trying to adopt  Routine of taking all of his medications at bedtime to help with medication compliance.   He reports that he has anxiety on a daily basis and reports that he has a frequent sense that he has lost track of something important. Reports some episodes of severe anxiety with thinking about if he took his medications, where he wrote it down, etc. Reports that he tries to prevent anxiety from reaching point of full-blown panic attack. He reports frequent worry. Recent worry about son and his family and wishes he was able to assist him more financially. Also manages his mother's finances. He reports that marital stressors have some recent legal papers r/t separation/divorce. He denies any noticeable improvement in anxiety with increase in [place].   Mood has been "depressed, sad." Reports that he has had some irritability which he attributes to anxiety. He describes his sleep as "episodic" and estimates sleeping 6-8 hours on average and that sleep is fragmented. He reports that he is not eating much and has lost a small amount of weight. He reports that he has been "having a hard time to get up out of bed and get motivated." Low energy. He reports that he has been trying to read several hours a day to help with concentration. Denies any improvement in concentration. Reports worsening short-term memory and will get up to  do something and immediately forget what it was. Reports some passive death wishes at times. Denies SI- "I don't believe I have the resolve to hurt myself."   Denies any recent impulsivity.   Past Psychiatric Medication Trials: Rexulti- helpful for mood. Possible akathisia Sertraline- has been helpful for mood and anxiety. Taken since 2006. Has taken up to 300 mg po qd Effexor Cymbalta- was effective and then stopped Prozac Abilify-"more confused" and possible paradoxical reaction. Buspar Propranolol- Not effective Lamictal- increased agitation Wellbutrin- Adverse effects, increased agitation Alprazolam- Effective Ativan- Ineffective Valium- Calming effect Ambien- Parasomnia      Review of Systems:  Review of Systems  Musculoskeletal: Negative for gait problem.       Knee pain  Neurological: Negative for tremors.  Psychiatric/Behavioral:       Please refer to HPI    Medications: I have reviewed the patient's current medications.  Current Outpatient Medications  Medication Sig Dispense Refill  . Apoaequorin (PREVAGEN EXTRA STRENGTH PO) Take by mouth.    . Ergocalciferol (VITAMIN D2 PO) Take 1.25 mg by mouth every 30 (thirty) days. Once weekly     . memantine (NAMENDA) 10 MG tablet Take 1 tablet (10 mg total) by mouth 2 (two) times daily. 60 tablet 12  . sertraline (ZOLOFT) 100 MG tablet Take 2.5 tablets (250 mg total) by mouth daily for 30 days. 75 tablet 1  . simvastatin (ZOCOR) 40 MG tablet Take 40 mg by mouth every evening.    . Tamsulosin HCl (FLOMAX) 0.4  MG CAPS Take 1 mg by mouth daily.    Marland Kitchen ALPRAZolam (XANAX) 0.5 MG tablet Take 1 tablet (0.5 mg total) by mouth 2 (two) times daily as needed for anxiety. At bedtime as needed 60 tablet 2   No current facility-administered medications for this visit.     Medication Side Effects: Other: Possible akathisia  Allergies:  Allergies  Allergen Reactions  . Lamictal [Lamotrigine]   . Wellbutrin [Bupropion]     Past  Medical History:  Diagnosis Date  . Anxiety   . Depression   . Hyperlipidemia   . Hypertension   . Sleep apnea    CPAP    Family History  Problem Relation Age of Onset  . Alzheimer's disease Mother   . Depression Mother   . Cancer Father        prostate  . Thyroid disease Father   . Alzheimer's disease Maternal Grandmother   . Heart disease Maternal Grandfather   . Stroke Paternal Grandmother   . Stroke Paternal Grandfather   . Alcoholism Brother   . Autism Daughter   . Autism Maternal Uncle     Social History   Socioeconomic History  . Marital status: Legally Separated    Spouse name: [name]  . Number of children: 2  . Years of education: 40  . 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 Financial resource strain: Not on file  . Food insecurity:    Worry: Not on file    Inability: Not on file  . Transportation needs:    Medical: Not on file    Non-medical: Not on file  Tobacco Use  . Smoking status: Never Smoker  . Smokeless tobacco: Never Used  Substance and Sexual Activity  . Alcohol use: Yes    Comment: 1-2 shots 5-7 x week  . Drug use: Yes    Types: Marijuana    Comment: Occasional  . Sexual activity: Never  Lifestyle  . Physical activity:    Days per week: Not on file    Minutes per session: Not on file  . Stress: Not on file  Relationships  . Social connections:    Talks on phone: Not on file    Gets together: Not on file    Attends religious service: Not on file    Active member of club or organization: Not on file    Attends meetings of clubs or organizations: Not on file    Relationship status: Not on file  . Intimate partner violence:    Fear of current or ex partner: Not on file    Emotionally abused: Not on file    Physically abused: Not on file    Forced sexual activity: Not on file  Other Topics Concern  . Not on file  Social History Narrative   Lives alone  retired.  Education: BS, MS Database administrator.  Is separated.  Children son, daughter, 2 gks, mother who is 5yr.  Cokes 0-3 daily.    Past Medical History, Surgical history, Social history, and Family history were reviewed and updated as appropriate.   Please see review of systems for further details on the patient's review from today.   Objective:   Physical Exam:  BP 114/66   Pulse (!) 58   Physical Exam Constitutional:      General: He is not in acute distress.    Appearance: He is well-developed.  Musculoskeletal:  General: No deformity.  Neurological:     Mental Status: He is alert and oriented to person, place, and time.     Coordination: Coordination normal.  Psychiatric:        Attention and Perception: Perception normal. He does not perceive auditory or visual hallucinations.        Mood and Affect: Mood normal. Mood is not anxious or depressed. Affect is not labile, blunt, angry or inappropriate.        Speech: Speech normal.        Behavior: Behavior is cooperative.        Thought Content: Thought content normal. Thought content does not include homicidal or suicidal ideation. Thought content does not include homicidal or suicidal plan.        Cognition and Memory: Cognition is impaired. He exhibits impaired recent memory.        Judgment: Judgment normal.     Comments: Insight intact. No delusions.  Patient presents as severely restless-frequently shifting in seat, crossing and on crossing his legs, and periodically standing and then sitting again      Lab Review:     Component Value Date/Time   NA 143 10/27/2017 1858   K 4.0 10/27/2017 1858   CL 110 10/27/2017 1858   CO2 24 10/27/2017 1858   GLUCOSE 85 10/27/2017 1858   BUN 16 10/27/2017 1858   CREATININE 1.19 10/27/2017 1858   CALCIUM 9.4 10/27/2017 1858   PROT 6.7 10/27/2017 1858   ALBUMIN 4.2 10/27/2017 1858   AST 20 10/27/2017 1858   ALT 18 10/27/2017 1858   ALKPHOS 40 10/27/2017 1858   BILITOT  1.3 (H) 10/27/2017 1858   GFRNONAA 60 (L) 10/27/2017 1858   GFRAA >60 10/27/2017 1858       Component Value Date/Time   WBC 8.3 10/27/2017 1858   RBC 4.97 10/27/2017 1858   HGB 15.8 10/27/2017 1858   HCT 45.8 10/27/2017 1858   PLT 191 10/27/2017 1858   MCV 92.2 10/27/2017 1858   MCH 31.8 10/27/2017 1858   MCHC 34.5 10/27/2017 1858   RDW 13.1 10/27/2017 1858    No results found for: POCLITH, LITHIUM   No results found for: PHENYTOIN, PHENOBARB, VALPROATE, CBMZ   .res Assessment: Plan:   Case staffed with Dr. Clovis Pu.  Will discontinue Rexulti since patient may be experiencing akathisia which could be contributing to anxiety and feeling restless.  Discussed increasing sertraline to 250 mg daily to improve anxiety and depression since sertraline has been the most effective medication for his mood and anxiety and has been well-tolerated.  Discussed that 250 mg doses above FDA approved amount and patient verbalized understanding and is in agreement with increasing sertraline to 250 mg daily. Recommend continuing Xanax 0.5 mg twice daily as needed for anxiety. Recommended several strategies to help patient be more consistent with medications and lower anxiety related to medication compliance, however reports that a pillbox or using pill pack would likely not be helpful for him.  He reports that taking medications once daily at bedtime is the best strategy for him and that taking memantine twice daily is the most difficult medication for him to take consistently.  Discussed that memantine comes in an extended release version that can be taken once daily and that this provider would send his neurologist a message about possibly changing to XR formulation since neurologist is managing memantine. Strongly encouraged patient to resume therapy since he reports significant psychosocial stressors and has not seen his therapist recently.  Mood disorder ([place])  Generalized anxiety disorder  Please see  After Visit Summary for patient specific instructions.  Future Appointments  Date Time Provider [place]  06/19/2018  1:00 PM [name], PMHNP [facility] None    No orders of the defined types were placed in this encounter.     -------------------------------

## 2018-06-19 ENCOUNTER — Encounter: Payer: Self-pay | Admitting: Psychiatry

## 2018-06-19 ENCOUNTER — Ambulatory Visit: Payer: Medicare Other | Admitting: Psychiatry

## 2018-06-19 VITALS — BP 155/93 | HR 65

## 2018-06-19 DIAGNOSIS — F39 Unspecified mood [affective] disorder: Secondary | ICD-10-CM

## 2018-06-19 DIAGNOSIS — F411 Generalized anxiety disorder: Secondary | ICD-10-CM | POA: Diagnosis not present

## 2018-06-19 MED ORDER — SERTRALINE HCL 100 MG PO TABS: 200.0000 mg | ORAL_TABLET | Freq: Every day | ORAL | 1 refills | Status: DC

## 2018-06-19 MED ORDER — MIRTAZAPINE 15 MG PO TABS: 15.0000 mg | ORAL_TABLET | Freq: Every day | ORAL | 1 refills | Status: DC

## 2018-06-19 NOTE — Patient Instructions (Addendum)
Decrease Sertraline to 200 mg daily (two of the 100 mg tabs).   Start Mirtazapine (Remeron) 15 mg at bedtime to help with sleep and anxiety.  Prescription sent to [facility].   I will request records from Dr. Drema Dallas and may contact her office to help coordinate care, as well as possibly reaching out to your family.

## 2018-06-19 NOTE — Progress Notes (Signed)
David [name] [id number] Feb 02, 1946 [age] y.o.  Subjective:   Patient ID:  David [name] is a 73 y.o. (DOB 1945/10/19) male.  Chief Complaint:  Chief Complaint  Patient presents with  . Anxiety  . Depression    HPI David [name] presents to the office today for follow-up of mood and anxiety. He reports "a lot of anxiety, and a lot of confusion." He reports that he did not recognize provider today and has had difficulty recognizing others. Reports that he has been having difficulty tracking whether he has taken certain medications. He reports that his anxiety is severe at times. He reports that he continues to feel restless. Reports occasional difficulty with sitting still. Reports that he often has anxiety about his confusion and thinking he has forgotten to do something he needed to do or has missed or over-taken medications."It's fear that I have forgotten to do something, or if I do, it's the wrong thing."  He reports that he is not sure if there has been any benefit with increase in Sertraline. He reports experiencing panic attacks "a couple of times a week." Frequent worry. He reports that mood has been depressed. He reports episodic irritability- "it's there more often than not." He reports that he has irritability with "estranged spouse." He describes sleep as "fitful" with difficulty staying asleep and occasional trouble falling asleep. Appetite has been decreased and typically is eating one meal a day. He reports low energy and low motivation. Reports significant difficulty with concentration and focus. He reports vague SI without intent or plan.   Denies impulsive or risky behaviors.  Reports that he is "not sure" if he is experiencing AH or VH.   He reports that he has been receiving limited assistance from wife.   Reports that he has not seen therapist recently. Reports that he saw PCP yesterday and does not recall details of this visit and initially could not remember being seen.    Past Psychiatric Medication Trials: Rexulti- helpful for mood. Possible akathisia Sertraline- has been helpful for mood and anxiety. Taken since 2006. Has taken up to 300 mg po qd Effexor Cymbalta- was effective and then stopped Prozac Abilify-"more confused" and possible paradoxical reaction. Rexulti- Possible akathisia at higher dose Buspar Propranolol- Not effective Lamictal- increased agitation Wellbutrin- Adverse effects, increased agitation Alprazolam- Effective Ativan- Ineffective Valium- Calming effect Ambien- Parasomnia    Review of Systems:  Review of Systems  Constitutional: Negative for chills and fever.  Gastrointestinal: Positive for nausea and vomiting.  Genitourinary: Positive for urgency.       Reports that he had some hematuria and this has resolved.   Musculoskeletal: Negative for gait problem.  Neurological: Negative for tremors.  Psychiatric/Behavioral:       Please refer to HPI    Medications: I have reviewed the patient's current medications.  Current Outpatient Medications  Medication Sig Dispense Refill  . memantine (NAMENDA) 10 MG tablet Take 1 tablet (10 mg total) by mouth 2 (two) times daily. 60 tablet 12  . sertraline (ZOLOFT) 100 MG tablet Take 2.5 tablets (250 mg total) by mouth daily for 30 days. 75 tablet 1  . simvastatin (ZOCOR) 40 MG tablet Take 40 mg by mouth every evening.    . Tamsulosin HCl (FLOMAX) 0.4 MG CAPS Take 1 mg by mouth daily.    Marland Kitchen ALPRAZolam (XANAX) 0.5 MG tablet Take 1 tablet (0.5 mg total) by mouth 2 (two) times daily as needed for anxiety. At bedtime as needed 60 tablet 2  .  Ergocalciferol (VITAMIN D2 PO) Take 1.25 mg by mouth every 30 (thirty) days. Once weekly      No current facility-administered medications for this visit.     Medication Side Effects: None  Allergies:  Allergies  Allergen Reactions  . Lamictal [Lamotrigine]   . Wellbutrin [Bupropion]     Past Medical History:  Diagnosis Date  . Anxiety    . Depression   . Hyperlipidemia   . Hypertension   . Sleep apnea    CPAP    Family History  Problem Relation Age of Onset  . Alzheimer's disease Mother   . Depression Mother   . Cancer Father        prostate  . Thyroid disease Father   . Alzheimer's disease Maternal Grandmother   . Heart disease Maternal Grandfather   . Stroke Paternal Grandmother   . Stroke Paternal Grandfather   . Alcoholism Brother   . Autism Daughter   . Autism Maternal Uncle     Social History   Socioeconomic History  . Marital status: Legally Separated    Spouse name: [name]  . Number of children: 2  . Years of education: 71  . 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 Financial resource strain: Not on file  . Food insecurity:    Worry: Not on file    Inability: Not on file  . Transportation needs:    Medical: Not on file    Non-medical: Not on file  Tobacco Use  . Smoking status: Never Smoker  . Smokeless tobacco: Never Used  Substance and Sexual Activity  . Alcohol use: Yes    Comment: 1-2 shots 5-7 x week  . Drug use: Yes    Types: Marijuana    Comment: Occasional  . Sexual activity: Never  Lifestyle  . Physical activity:    Days per week: Not on file    Minutes per session: Not on file  . Stress: Not on file  Relationships  . Social connections:    Talks on phone: Not on file    Gets together: Not on file    Attends religious service: Not on file    Active member of club or organization: Not on file    Attends meetings of clubs or organizations: Not on file    Relationship status: Not on file  . Intimate partner violence:    Fear of current or ex partner: Not on file    Emotionally abused: Not on file    Physically abused: Not on file    Forced sexual activity: Not on file  Other Topics Concern  . Not on file  Social History Narrative   Lives alone retired.  Education: BS, MS Database administrator.  Is  separated.  Children son, daughter, 2 gks, mother who is 28yr.  Cokes 0-3 daily.    Past Medical History, Surgical history, Social history, and Family history were reviewed and updated as appropriate.   Please see review of systems for further details on the patient's review from today.   Objective:   Physical Exam:  BP (!) 155/93   Pulse 65   Physical Exam Constitutional:      General: He is not in acute distress.    Appearance: He is well-developed. He is diaphoretic.  Musculoskeletal:        General: No deformity.  Neurological:     Mental Status: He is alert. He  is disoriented.     Coordination: Coordination normal.  Psychiatric:        Attention and Perception: Perception normal. He is inattentive. He does not perceive auditory or visual hallucinations.        Mood and Affect: Mood is anxious and depressed. Affect is not labile, blunt, angry or inappropriate.        Speech: Speech normal.        Behavior: Behavior is cooperative.        Thought Content: Thought content normal. Thought content does not include homicidal or suicidal ideation. Thought content does not include homicidal or suicidal plan.        Cognition and Memory: Cognition is impaired. Memory is impaired. He exhibits impaired recent memory.        Judgment: Judgment is inappropriate.     Comments: Insight intact. No delusions.  MMSE=23/30        Lab Review:     Component Value Date/Time   NA 143 10/27/2017 1858   K 4.0 10/27/2017 1858   CL 110 10/27/2017 1858   CO2 24 10/27/2017 1858   GLUCOSE 85 10/27/2017 1858   BUN 16 10/27/2017 1858   CREATININE 1.19 10/27/2017 1858   CALCIUM 9.4 10/27/2017 1858   PROT 6.7 10/27/2017 1858   ALBUMIN 4.2 10/27/2017 1858   AST 20 10/27/2017 1858   ALT 18 10/27/2017 1858   ALKPHOS 40 10/27/2017 1858   BILITOT 1.3 (H) 10/27/2017 1858   GFRNONAA 60 (L) 10/27/2017 1858   GFRAA >60 10/27/2017 1858       Component Value Date/Time   WBC 8.3 10/27/2017 1858    RBC 4.97 10/27/2017 1858   HGB 15.8 10/27/2017 1858   HCT 45.8 10/27/2017 1858   PLT 191 10/27/2017 1858   MCV 92.2 10/27/2017 1858   MCH 31.8 10/27/2017 1858   MCHC 34.5 10/27/2017 1858   RDW 13.1 10/27/2017 1858    No results found for: POCLITH, LITHIUM   No results found for: PHENYTOIN, PHENOBARB, VALPROATE, CBMZ   .res Assessment: Plan:   Case staffed with Dr. Clovis Pu.  Will obtain information release patient's primary care provider and will request records, to include note from visit yesterday. Discussed that PCP's office may be contacted in order to coordinate care based on recent notes. Pt agrees with this plan.  Discussed that after notes are received, provider may contact pt's wife and/or his son if further assistance is needed. Pt also agrees with this plan. Information release to speak with son obtained at time of visit.   Will decrease Sertraline to 200 mg po qd since there has not been any perceived benefit with dose increase.   Will start Remeron 15 mg po QHS to help with anxiety, depression, and insomnia. Discussed that increased sleep quantity may also improve cognition.   Patient advised to contact office with any questions, adverse effects, or acute worsening in signs and symptoms.  Patient provided with AVS with written instructions since he reports difficulty tracking plan of care.   Mood disorder ([place])  Generalized anxiety disorder  Please see After Visit Summary for patient specific instructions.  No future appointments.  No orders of the defined types were placed in this encounter.     -------------------------------

## 2018-06-20 ENCOUNTER — Telehealth: Payer: Self-pay | Admitting: Psychiatry

## 2018-06-20 NOTE — Telephone Encounter (Signed)
Patient stopped in office @2 :47 today stating he was confused about medication changes that was discussed at 03/10 visit patient having a lot of anxiety trying to figure out how to take the medication

## 2018-06-21 ENCOUNTER — Other Ambulatory Visit: Payer: Self-pay | Admitting: Urology

## 2018-06-21 NOTE — Telephone Encounter (Signed)
Admin staff met with pt and reviewed AVS with patient from visit the day before. Pt had AVS that had been given to him at time of visit and reported that he had difficulty comprehending instructions. Staff member recommended he consider daily pill box to help with medication compliance.

## 2018-06-22 ENCOUNTER — Encounter (HOSPITAL_COMMUNITY): Payer: Self-pay | Admitting: *Deleted

## 2018-06-22 ENCOUNTER — Telehealth: Payer: Self-pay | Admitting: Psychiatry

## 2018-06-22 NOTE — Telephone Encounter (Signed)
Contacted pt's wife (legally separated), [name] Kaeding, about recent acute mental status changes. She reports that she has also noticed recent increased confusion and has concern about pt's overall safety and well-being. She agrees with concerns that patient is not taking medications as prescribed. She reports that he was prescribed an antibiotic for a UTI and is unsure if he took the antibiotic. She reports that she has offered to assist him and he rejects her help. She reports that he will not allow her into his condo because he does not want her to see how "awful it looks." She reports that she is his only support since neither of their children are able to assist (daughter has special needs) and there is no one else available. She questions if he may be intentionally not caring for himself adequately because of passive death wishes/suicidal thoughts.  She reports he has also demonstrated some recent changes in behavior to include withdrawing a large sum of money from his 56 k and initially told her he did not know why he did it and then later telling her he did it to cover taxes. She reports that he said he did not feel up to taking their daughter to [place] dinner this evening, and this is very unusual for him. She has noticed confusion waxes and wanes and discussed that pt exhibited this on exam this week (ie. Was disoriented and did not know day of the week and then 20 minutes later was oriented when MMSE was administered). Discussed that he has presented as disheveled and he had a noticeable body odor on 06/20/18 and was wearing the same clothes he had worn the day prior with an "I voted" sticker from 3/3 election day. Wife reports that she has also noticed a decline in his hygiene and questions if he is washing his clothes.  She reports that he has not been sleeping adequately and that sleep has been unimproved with Remeron 15 mg po QHS that was initiated this week.  Case staffed with Dr. Clovis Pu.  Recommend family take him to ER for evaluation due to acute change in mental status to r/o underlying medical cause and due to concerns about pt's safety and overall well-being.  Discussed recommendations with pt's wife. She reports that pt was resistant to discussion of going to the ER this evening. She reports that she plans on considering taking him to the ER or urgent care at [facility] office tomorrow morning. Wife requests that provider call her tomorrow morning to find out update on how things are going and what wife has decided to do.

## 2018-06-22 NOTE — Discharge Instructions (Signed)

## 2018-06-23 ENCOUNTER — Emergency Department (HOSPITAL_COMMUNITY)
Admission: EM | Admit: 2018-06-23 | Discharge: 2018-06-24 | Disposition: A | Discharge status: Other Acute Inpatient Hospital | Payer: Medicare Other | Attending: Emergency Medicine | Admitting: Emergency Medicine

## 2018-06-23 ENCOUNTER — Other Ambulatory Visit: Payer: Self-pay

## 2018-06-23 ENCOUNTER — Telehealth: Payer: Self-pay | Admitting: Psychiatry

## 2018-06-23 ENCOUNTER — Encounter (HOSPITAL_COMMUNITY): Payer: Self-pay | Admitting: *Deleted

## 2018-06-23 DIAGNOSIS — F332 Major depressive disorder, recurrent severe without psychotic features: Secondary | ICD-10-CM | POA: Diagnosis present

## 2018-06-23 DIAGNOSIS — R45851 Suicidal ideations: Secondary | ICD-10-CM

## 2018-06-23 DIAGNOSIS — Z79899 Other long term (current) drug therapy: Secondary | ICD-10-CM | POA: Diagnosis not present

## 2018-06-23 DIAGNOSIS — F329 Major depressive disorder, single episode, unspecified: Secondary | ICD-10-CM

## 2018-06-23 DIAGNOSIS — I10 Essential (primary) hypertension: Secondary | ICD-10-CM | POA: Diagnosis not present

## 2018-06-23 DIAGNOSIS — F0390 Unspecified dementia without behavioral disturbance: Secondary | ICD-10-CM | POA: Diagnosis not present

## 2018-06-23 DIAGNOSIS — Z9114 Patient's other noncompliance with medication regimen: Secondary | ICD-10-CM | POA: Diagnosis not present

## 2018-06-23 DIAGNOSIS — F32A Depression, unspecified: Secondary | ICD-10-CM

## 2018-06-23 DIAGNOSIS — F419 Anxiety disorder, unspecified: Secondary | ICD-10-CM | POA: Diagnosis not present

## 2018-06-23 LAB — URINALYSIS, ROUTINE W REFLEX MICROSCOPIC
Bacteria, UA: NONE SEEN
Bilirubin Urine: NEGATIVE
Glucose, UA: NEGATIVE mg/dL
Ketones, ur: NEGATIVE mg/dL
Leukocytes,Ua: NEGATIVE
NITRITE: NEGATIVE
Protein, ur: NEGATIVE mg/dL
Specific Gravity, Urine: 1.01 (ref 1.005–1.030)
pH: 7 (ref 5.0–8.0)

## 2018-06-23 LAB — COMPREHENSIVE METABOLIC PANEL
ALT: 14 U/L (ref 0–44)
AST: 17 U/L (ref 15–41)
Albumin: 4.1 g/dL (ref 3.5–5.0)
Alkaline Phosphatase: 46 U/L (ref 38–126)
Anion gap: 9 (ref 5–15)
BUN: 17 mg/dL (ref 8–23)
CO2: 24 mmol/L (ref 22–32)
Calcium: 9.2 mg/dL (ref 8.9–10.3)
Chloride: 106 mmol/L (ref 98–111)
Creatinine, Ser: 1.32 mg/dL — ABNORMAL HIGH (ref 0.61–1.24)
GFR calc non Af Amer: 54 mL/min — ABNORMAL LOW (ref >60)
Glucose, Bld: 89 mg/dL (ref 70–99)
Potassium: 4.2 mmol/L (ref 3.5–5.1)
Sodium: 139 mmol/L (ref 135–145)
TOTAL PROTEIN: 6.4 g/dL — AB (ref 6.5–8.1)
Total Bilirubin: 0.9 mg/dL (ref 0.3–1.2)

## 2018-06-23 LAB — RAPID URINE DRUG SCREEN, HOSP PERFORMED
Amphetamines: NOT DETECTED
Barbiturates: NOT DETECTED
Benzodiazepines: POSITIVE — AB
Cocaine: NOT DETECTED
Opiates: NOT DETECTED
Tetrahydrocannabinol: POSITIVE — AB

## 2018-06-23 LAB — CBC
HCT: 42.3 % (ref 39.0–52.0)
Hemoglobin: 13.6 g/dL (ref 13.0–17.0)
MCH: 30.8 pg (ref 26.0–34.0)
MCHC: 32.2 g/dL (ref 30.0–36.0)
MCV: 95.7 fL (ref 80.0–100.0)
Platelets: 258 10*3/uL (ref 150–400)
RBC: 4.42 MIL/uL (ref 4.22–5.81)
RDW: 12.4 % (ref 11.5–15.5)
WBC: 11.3 10*3/uL — ABNORMAL HIGH (ref 4.0–10.5)
nRBC: 0 % (ref 0.0–0.2)

## 2018-06-23 LAB — ETHANOL: Alcohol, Ethyl (B): <10 mg/dL (ref <10)

## 2018-06-23 LAB — ACETAMINOPHEN LEVEL: Acetaminophen (Tylenol), Serum: <10 ug/mL — ABNORMAL LOW (ref 10–30)

## 2018-06-23 LAB — SALICYLATE LEVEL: Salicylate Lvl: <7 mg/dL (ref 2.8–30.0)

## 2018-06-23 LAB — TSH: TSH: 1.897 u[IU]/mL (ref 0.350–4.500)

## 2018-06-23 MED ORDER — SIMVASTATIN 40 MG PO TABS
40.0000 mg | ORAL_TABLET | Freq: Every evening | ORAL | Status: DC
  Administered 2018-06-23: 40 mg via ORAL
  Filled 2018-06-23 (×2): qty 1

## 2018-06-23 MED ORDER — MEMANTINE HCL 10 MG PO TABS
10.0000 mg | ORAL_TABLET | Freq: Every day | ORAL | Status: DC
  Administered 2018-06-23: 10 mg via ORAL
  Filled 2018-06-23 (×2): qty 1

## 2018-06-23 NOTE — Telephone Encounter (Signed)
Contacted pt's wife per her request yesterday. She reports that pt had told her he would call her by 9:30 this morning and she had not yet heard from him.  She reports that care provider for her daughter with special needs call out sick today and she has contacted agency to request another care provider.  She reports that she will contact this provider via answering service with any questions or updates.

## 2018-06-23 NOTE — ED Provider Notes (Signed)
[facility] DEPT Provider Note   CSN: [id number] Arrival date & time: 06/23/18  1724    History   Chief Complaint Chief Complaint  Patient presents with  . Suicidal    HPI David [name] is a 73 y.o. male.     The history is provided by the patient.  Mental Health Problem  Presenting symptoms: agitation, depression, suicidal thoughts and suicidal threats   Patient accompanied by:  Family member Degree of incapacity (severity):  Moderate Onset quality:  Gradual Timing:  Constant Progression:  Worsening Chronicity:  Recurrent Context: noncompliance   Treatment compliance:  Unable to specify Relieved by:  Nothing Worsened by:  Nothing Associated symptoms: anhedonia, anxiety, decreased need for sleep, feelings of worthlessness, insomnia and irritability   Associated symptoms: no abdominal pain and no chest pain   Risk factors: hx of mental illness     Past Medical History:  Diagnosis Date  . Anxiety   . Depression   . Hyperlipidemia   . Hypertension   . Sleep apnea    CPAP    Patient Active Problem List   Diagnosis Date Noted  . Major depressive disorder, recurrent episode, severe ([place]) 10/28/2017  . Generalized anxiety disorder 10/28/2017  . 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 Dyslipidemia 07/10/2012  . Metabolic syndrome 57/84/6962  . Prostatic hypertrophy 07/10/2012  . Testosterone deficiency 07/10/2012  . Essential hypertension 06/27/2011  . MDD (major depressive disorder), recurrent episode, mild ([place]) 05/16/2011    Past Surgical History:  Procedure Laterality Date  . CATARACT EXTRACTION, BILATERAL  2018  . EXTERNAL EAR SURGERY     child  . EYE SURGERY Bilateral    cataract  . right knee meniscus  06/12/2008  . right talus repair     dislocated        Home Medications    Prior to Admission medications   Medication Sig Start Date End Date Taking? Authorizing Provider  ALPRAZolam Duanne Moron) 0.5  MG tablet Take 1 tablet (0.5 mg total) by mouth 2 (two) times daily as needed for anxiety. At bedtime as needed 04/19/18 06/23/18 Yes [name], PMHNP  diclofenac sodium (VOLTAREN) 1 % GEL Apply 1 application topically at bedtime as needed for pain. 04/27/18  Yes [provider]  memantine (NAMENDA) 10 MG tablet Take 1 tablet (10 mg total) by mouth 2 (two) times daily. Patient taking differently: Take 10 mg by mouth at bedtime.  01/01/18  Yes Penumalli, Earlean Polka, MD  mirtazapine (REMERON) 15 MG tablet Take 1 tablet (15 mg total) by mouth at bedtime for 30 days. 06/19/18 07/19/18 Yes [name], PMHNP  sertraline (ZOLOFT) 100 MG tablet Take 2 tablets (200 mg total) by mouth daily for 30 days. 06/19/18 07/19/18 Yes [name], PMHNP  simvastatin (ZOCOR) 40 MG tablet Take 40 mg by mouth every evening.   Yes [provider]  Tamsulosin HCl (FLOMAX) 0.4 MG CAPS Take 1 mg by mouth daily.   Yes [provider]    Family History Family History  Problem Relation Age of Onset  . Alzheimer's disease Mother   . Depression Mother   . Cancer Father        prostate  . Thyroid disease Father   . Alzheimer's disease Maternal Grandmother   . Heart disease Maternal Grandfather   . Stroke Paternal Grandmother   . Stroke Paternal Grandfather   . Alcoholism Brother   . Autism Daughter   . Autism Maternal Uncle  Social History Social History   Tobacco Use  . Smoking status: Never Smoker  . Smokeless tobacco: Never Used  Substance Use Topics  . Alcohol use: Yes    Comment: 1-2 shots 5-7 x week  . Drug use: Yes    Types: Marijuana    Comment: Occasional     Allergies   Lamictal [lamotrigine] and Wellbutrin [bupropion]   Review of Systems Review of Systems  Constitutional: Positive for irritability. Negative for chills and fever.  HENT: Negative for ear pain and sore throat.   Eyes: Negative for pain and visual disturbance.  Respiratory: Negative for cough  and shortness of breath.   Cardiovascular: Negative for chest pain and palpitations.  Gastrointestinal: Negative for abdominal pain and vomiting.  Genitourinary: Negative for dysuria and hematuria.  Musculoskeletal: Negative for arthralgias and back pain.  Skin: Negative for color change and rash.  Neurological: Negative for seizures and syncope.  Psychiatric/Behavioral: Positive for agitation, confusion, decreased concentration and suicidal ideas. The patient is nervous/anxious and has insomnia.   All other systems reviewed and are negative.    Physical Exam Updated Vital Signs  ED Triage Vitals  Enc Vitals Group     BP 06/23/18 1754 114/68     Pulse Rate 06/23/18 1754 71     Resp 06/23/18 1754 18     Temp 06/23/18 1754 98.3 F (36.8 C)     Temp Source 06/23/18 1754 Oral     SpO2 06/23/18 1754 100 %     Weight --      Height --      Head Circumference --      Peak Flow --      Pain Score 06/23/18 1819 0     Pain Loc --      Pain Edu? --      Excl. in [place]? --     Physical Exam Vitals signs and nursing note reviewed.  Constitutional:      Appearance: He is well-developed.  HENT:     Head: Normocephalic and atraumatic.     Nose: Nose normal.     Mouth/Throat:     Mouth: Mucous membranes are moist.  Eyes:     Extraocular Movements: Extraocular movements intact.     Conjunctiva/sclera: Conjunctivae normal.     Pupils: Pupils are equal, round, and reactive to light.  Neck:     Musculoskeletal: Normal range of motion and neck supple.  Cardiovascular:     Rate and Rhythm: Normal rate and regular rhythm.     Pulses: Normal pulses.     Heart sounds: Normal heart sounds. No murmur.  Pulmonary:     Effort: Pulmonary effort is normal. No respiratory distress.     Breath sounds: Normal breath sounds.  Abdominal:     General: There is no distension.     Palpations: Abdomen is soft.     Tenderness: There is no abdominal tenderness.  Musculoskeletal: Normal range of motion.      Right lower leg: No edema.     Left lower leg: No edema.  Skin:    General: Skin is warm and dry.  Neurological:     General: No focal deficit present.     Mental Status: He is alert and oriented to person, place, and time.     Cranial Nerves: No cranial nerve deficit.     Sensory: No sensory deficit.     Motor: No weakness.     Gait: Gait normal.  Psychiatric:  Attention and Perception: Attention normal.        Mood and Affect: Mood is anxious and depressed. Affect is flat.        Speech: Speech normal.        Behavior: Behavior normal.        Thought Content: Thought content includes suicidal ideation. Thought content does not include homicidal ideation. Thought content includes suicidal plan. Thought content does not include homicidal plan.      ED Treatments / Results  Labs (all labs ordered are listed, but only abnormal results are displayed) Labs Reviewed  COMPREHENSIVE METABOLIC PANEL - Abnormal; Notable for the following components:      Result Value   Creatinine, Ser 1.32 (*)    Total Protein 6.4 (*)    GFR calc non Af Amer 54 (*)    All other components within normal limits  ACETAMINOPHEN LEVEL - Abnormal; Notable for the following components:   Acetaminophen (Tylenol), Serum <10 (*)    All other components within normal limits  CBC - Abnormal; Notable for the following components:   WBC 11.3 (*)    All other components within normal limits  RAPID URINE DRUG SCREEN, HOSP PERFORMED - Abnormal; Notable for the following components:   Benzodiazepines POSITIVE (*)    Tetrahydrocannabinol POSITIVE (*)    All other components within normal limits  URINALYSIS, ROUTINE W REFLEX MICROSCOPIC - Abnormal; Notable for the following components:   Hgb urine dipstick MODERATE (*)    All other components within normal limits  ETHANOL  SALICYLATE LEVEL  TSH    EKG EKG Interpretation  Date/Time:  Saturday June 23 2018 20:36:33 EDT Ventricular Rate:  61 PR  Interval:  152 QRS Duration: 104 QT Interval:  430 QTC Calculation: 432 R Axis:   43 Text Interpretation:  Normal sinus rhythm Abnormal ECG Confirmed by Lennice Sites [phone]) on 06/23/2018 8:46:25 PM   Radiology No results found.  Procedures Procedures (including critical care time)  Medications Ordered in ED Medications  memantine (NAMENDA) tablet 10 mg (10 mg Oral Given 06/23/18 2208)  simvastatin (ZOCOR) tablet 40 mg (40 mg Oral Given 06/23/18 2209)     Initial Impression / Assessment and Plan / ED Course  I have reviewed the triage vital signs and the nursing notes.  Pertinent labs & imaging results that were available during my care of the patient were reviewed by me and considered in my medical decision making (see chart for details).     Leopold Smyers Reetz is a 73 year old male with history of depression, anxiety, possible dementia who presents to the ED with suicidal ideation.  Patient with normal vitals.  No fever.  Is here with his ex-wife.  She states that his psychologist called her yesterday to discuss her concern that patient has not been taking her antidepressants and endorsing SI.  Patient states that over the last several days/weeks he has had decreased motivation, depression, confusion, decreased need for sleep, lack of appetite.  He states that he is starting to think more more about killing himself and basically going off to the wilderness to live on his own and try to survive in the wilderness and let nature take its course.  He overall is neurologically intact.  No fever, no cough, no sputum production.  No pain with urination.  He is supposedly scheduled for urological procedure on Monday.  However, patient states that he does not have any abdominal pain or flank pain.  Patient continues to endorse suicidal thoughts,  depression.  He appears sincere about his plan to go off into the woods and let nature take its course.  Lab work was unremarkable.  No urine infection.   No leukocytosis.  He does test positive for marijuana and benzos.  Possibly some of the symptoms are secondary to drug use and possibly secondary to dementia with behavioral disturbance.  However, patient voluntarily here and will have psychiatry evaluate for need for inpatient stabilization.  Awaiting final psychiatric disposition.  This chart was dictated using voice recognition software.  Despite best efforts to proofread,  errors can occur which can change the documentation meaning.    Final Clinical Impressions(s) / ED Diagnoses   Final diagnoses:  Suicidal ideation  Depression, unspecified depression type    ED Discharge Orders    None       Lennice Sites, DO 06/23/18 2245

## 2018-06-23 NOTE — ED Triage Notes (Signed)
Pt's wife states the pt has been more forgetful, confused, and anxious over past 2 weeks. Pt states he may not be taking his medication appropriately. Pt wife states he is also neglecting personal care. Pt states he has been having suicidal thoughts.

## 2018-06-23 NOTE — Telephone Encounter (Signed)
Received call from [place] (wife- legally separated) who reports that she is taking pt to [place] [place] ER due to acute changes in mental status as recommended by this provider and Dr. Lynder Parents.   Contacted [place] re: reason pt was advised to go to ER. Discussed that pt has had an acute change in mental status and there are concerns about his safety and well being. He is exhibiting increased confusion and disorientation, difficulty taking medications as prescribed, decline in personal hygiene, and changes in behavior. Discussed that he lives alone and has limited supports, and is not accepting assistance from support available. Pt had recent UTI and it is unclear if he took prescribed antibiotics. Discussed that pt has a h/o psychiatric hospitalization and that [place] has verbalized concerns that pt may currently have suicidal ideation and may be passively endangering himself.

## 2018-06-23 NOTE — BH Assessment (Addendum)
Assessment Note  David [name] is an 73 y.o. male presenting with SI with plan to slit wrist or "let nature take its course". Patient reported forgetfulness, confusion and anger over the past 2 weeks. Patient reported SI on and off for the past 3-5 years. Patient reported no prior suicide attempts, inpatient treatment and no prior self-harming behaviors. Patient reported living alone. Patient reported drinking a shot of vodka 1-2 times monthly. Patient denied HI and psychosis. Patient reported being separated from wife, however his wife is his main support system. Patient reported having 2 adult children and sometimes helping them out financially. Patient reported taking care of his 22 year old mothers business affairs. Patient reported mother has symptoms of dementia.  Patient was calm and cooperative. Patient is alert and oriented x4. Thought processes are coherent and relevant. Patient mood/affect was depressed. Patient displayed logical and coherent speech. Patient eye contact was fair. Minimal anxiety level and fair eye contact. Patient gave clinician permission to contact Quincy Carnes, patients wife at [phone].   Collateral Contact Clinician spoke with Quincy Carnes, wife, [phone]. Patient and wife are separated. [name] stated, "can you please call me back in the morning as I have a daughter and I don't want to wake her". Patient declined to talk at the present time due to the time. Clinician informed [name] that another TTS clinician would be calling in the morning for additional information if needed.   Diagnosis: Major depressive disorder  Past Medical History:  Past Medical History:  Diagnosis Date  . Anxiety   . Depression   . Hyperlipidemia   . Hypertension   . Sleep apnea    CPAP    Past Surgical History:  Procedure Laterality Date  . CATARACT EXTRACTION, BILATERAL  2018  . EXTERNAL EAR SURGERY     child  . EYE SURGERY Bilateral    cataract  . right knee  meniscus  06/12/2008  . right talus repair     dislocated    Family History:  Family History  Problem Relation Age of Onset  . Alzheimer's disease Mother   . Depression Mother   . Cancer Father        prostate  . Thyroid disease Father   . Alzheimer's disease Maternal Grandmother   . Heart disease Maternal Grandfather   . Stroke Paternal Grandmother   . Stroke Paternal Grandfather   . Alcoholism Brother   . Autism Daughter   . Autism Maternal Uncle     Social History:  reports that he has never smoked. He has never used smokeless tobacco. He reports current alcohol use. He reports current drug use. Drug: Marijuana.  Additional Social History:  Alcohol / Drug Use Pain Medications: see MAR Prescriptions: see MAR Over the Counter: see MAR  CIWA: CIWA-Ar BP: 138/69 Pulse Rate: 73 COWS:    Allergies:  Allergies  Allergen Reactions  . Lamictal [Lamotrigine]     Increased agitation  . Wellbutrin [Bupropion] Anxiety    Patient does not recall allergy type    Home Medications: (Not in a hospital admission)   OB/GYN Status:  No LMP for male patient.  General Assessment Data Location of Assessment: [facility] TTS Assessment: In system Is this a Tele or Face-to-Face Assessment?: Face-to-Face Is this an Initial Assessment or a Re-assessment for this encounter?: Initial Assessment Patient Accompanied by:: N/A Language Other than English: No Living Arrangements: (family home alone) What gender do you identify as?: Male Marital status: Separated Pregnancy Status: (n/a)  Living Arrangements: Alone Can pt return to current living arrangement?: Yes Admission Status: Voluntary Is patient capable of signing voluntary admission?: Yes Referral Source: Self/Family/Friend     Crisis Care Plan Living Arrangements: Alone Legal Guardian: (self) Name of Psychiatrist: (none) Name of Therapist: [name])  Education Status Is patient currently in school?: No Is the patient  employed, unemployed or receiving disability?: Unemployed  Risk to self with the past 6 months Suicidal Ideation: Yes-Currently Present Has patient been a risk to self within the past 6 months prior to admission? : Yes Suicidal Intent: Yes-Currently Present Has patient had any suicidal intent within the past 6 months prior to admission? : Yes Is patient at risk for suicide?: Yes Suicidal Plan?: Yes-Currently Present Has patient had any suicidal plan within the past 6 months prior to admission? : Yes Specify Current Suicidal Plan: (cut self or nature take its course) Access to Means: Yes Specify Access to Suicidal Means: (sharp objects) What has been your use of drugs/alcohol within the last 12 months?: (none) Previous Attempts/Gestures: No How many times?: (0) Other Self Harm Risks: (0) Triggers for Past Attempts: (n/a) Intentional Self Injurious Behavior: None Family Suicide History: No Recent stressful life event(s): (increased confusion and forgetfullness) Persecutory voices/beliefs?: No Depression: Yes Depression Symptoms: Feeling worthless/self pity, Loss of interest in usual pleasures, Guilt, Fatigue, Tearfulness Substance abuse history and/or treatment for substance abuse?: No Suicide prevention information given to non-admitted patients: Not applicable  Risk to Others within the past 6 months Homicidal Ideation: No Does patient have any lifetime risk of violence toward others beyond the six months prior to admission? : No Thoughts of Harm to Others: No Current Homicidal Intent: No Current Homicidal Plan: No Access to Homicidal Means: No Identified Victim: (n/a) History of harm to others?: No Assessment of Violence: None Noted Violent Behavior Description: (n/a) Does patient have access to weapons?: No Criminal Charges Pending?: No Does patient have a court date: No Is patient on probation?: No  Psychosis Hallucinations: None noted Delusions: None noted  Mental  Status Report Appearance/Hygiene: Unremarkable Eye Contact: Fair Motor Activity: Unremarkable Speech: Logical/coherent Level of Consciousness: Alert Mood: Depressed Affect: Depressed Anxiety Level: Minimal Thought Processes: Coherent, Relevant Judgement: Impaired Orientation: Person, Place, Time, Situation Obsessive Compulsive Thoughts/Behaviors: None  Cognitive Functioning Concentration: Fair Memory: Recent Intact Is patient IDD: No Insight: Fair Impulse Control: Fair Appetite: Good Have you had any weight changes? : No Change Sleep: Decreased Total Hours of Sleep: (6) Vegetative Symptoms: None  ADLScreening [facility] Assessment Services) Patient's cognitive ability adequate to safely complete daily activities?: Yes Patient able to express need for assistance with ADLs?: Yes Independently performs ADLs?: Yes (appropriate for developmental age)  Prior Inpatient Therapy Prior Inpatient Therapy: No  Prior Outpatient Therapy Prior Outpatient Therapy: Yes Prior Therapy Dates: (present) Prior Therapy Facilty/Provider(s): [name]) Reason for Treatment: (depression) Does patient have an ACCT team?: No Does patient have Intensive In-House Services?  : No Does patient have [facility] services? : No Does patient have P4CC services?: No  ADL Screening (condition at time of admission) Patient's cognitive ability adequate to safely complete daily activities?: Yes Patient able to express need for assistance with ADLs?: Yes Independently performs ADLs?: Yes (appropriate for developmental age)  Disposition:  Disposition Initial Assessment Completed for this Encounter: Yes  Lindon Romp, NP, patient meets inpatient criteria for geropsych. TTS to secure placement. Erasmo Downer, RN, informed of disposition.  On Site Evaluation by:   Reviewed with Physician:    Venora Maples,  [facility] 06/23/2018 11:31 PM

## 2018-06-24 DIAGNOSIS — Z9114 Patient's other noncompliance with medication regimen: Secondary | ICD-10-CM

## 2018-06-24 DIAGNOSIS — F0390 Unspecified dementia without behavioral disturbance: Secondary | ICD-10-CM

## 2018-06-24 DIAGNOSIS — F419 Anxiety disorder, unspecified: Secondary | ICD-10-CM

## 2018-06-24 DIAGNOSIS — F332 Major depressive disorder, recurrent severe without psychotic features: Secondary | ICD-10-CM

## 2018-06-24 MED ORDER — MIRTAZAPINE 7.5 MG PO TABS: 15.0000 mg | ORAL_TABLET | Freq: Every day | ORAL | Status: DC

## 2018-06-24 MED ORDER — SERTRALINE HCL 50 MG PO TABS: 200.0000 mg | ORAL_TABLET | Freq: Every day | ORAL | Status: DC

## 2018-06-24 MED ORDER — SERTRALINE HCL 50 MG PO TABS
50.0000 mg | ORAL_TABLET | Freq: Every day | ORAL | Status: DC
  Administered 2018-06-24: 50 mg via ORAL
  Filled 2018-06-24: qty 1

## 2018-06-24 NOTE — ED Notes (Addendum)
Collateral Contact Clinician spoke with David [name], wife, [phone]. Patient and wife are separated. [name] stated, "can you please call me back in the morning as I have a daughter and I don't want to wake her". Patient declined to talk at the present time due to the time. Clinician informed [name] that another TTS clinician would be calling in the morning for additional information if needed. Patient gave consent to speak with wife.

## 2018-06-24 NOTE — ED Notes (Signed)
Lindon Romp, NP, patient meets inpatient criteria for geropsych. TTS to secure placement. Erasmo Downer, RN, informed of disposition.

## 2018-06-24 NOTE — Consult Note (Signed)
[facility] Face-to-Face Psychiatry Consult   Reason for Consult: suicidal plan with plan to slit wrist Referring Physician:  EDP Patient Identification: David [name] MRN:  [id number] Principal Diagnosis: Major depressive disorder, recurrent episode, severe ([place]) Diagnosis:  Principal Problem:   Major depressive disorder, recurrent episode, severe ([place])   Total Time spent with patient: 45 minutes  Subjective:   David [name] is a 73 y.o. male patient admitted due to suicidal thought.  HPI:  Patient with long history of Major depression, Generalized anxiety disorder and Dementia who was admitted due to suicidal ideation with plan to slit his wrist. Patient reports that he has been going through a lot of stress: he lives alone after he separated from his wife, his health deteriorating, worry about his 48 year old mother who is in nursing home and thinks about his child with special need. Patient reports non-complinat with his medications and as a result has been having suicidal thoughts on and off for the past 3-5 years. Patient denies drug, alcohol use but unable to contract for safety.  Past Psychiatric History: as above  Risk to Self: Suicidal Ideation: Yes-Currently Present Suicidal Intent: Yes-Currently Present Is patient at risk for suicide?: Yes Suicidal Plan?: Yes-Currently Present Specify Current Suicidal Plan: (cut self or nature take its course) Access to Means: Yes Specify Access to Suicidal Means: (sharp objects) What has been your use of drugs/alcohol within the last 12 months?: (none) How many times?: (0) Other Self Harm Risks: (0) Triggers for Past Attempts: (n/a) Intentional Self Injurious Behavior: None Risk to Others: Homicidal Ideation: No Thoughts of Harm to Others: No Current Homicidal Intent: No Current Homicidal Plan: No Access to Homicidal Means: No Identified Victim: (n/a) History of harm to others?: No Assessment of Violence: None Noted Violent Behavior  Description: (n/a) Does patient have access to weapons?: No Criminal Charges Pending?: No Does patient have a court date: No Prior Inpatient Therapy: Prior Inpatient Therapy: No Prior Outpatient Therapy: Prior Outpatient Therapy: Yes Prior Therapy Dates: (present) Prior Therapy Facilty/Provider(s): [name]) Reason for Treatment: (depression) Does patient have an ACCT team?: No Does patient have Intensive In-House Services?  : No Does patient have [facility] services? : No Does patient have P4CC services?: No  Past Medical History:  Past Medical History:  Diagnosis Date  . Anxiety   . Depression   . Hyperlipidemia   . Hypertension   . Sleep apnea    CPAP    Past Surgical History:  Procedure Laterality Date  . CATARACT EXTRACTION, BILATERAL  2018  . EXTERNAL EAR SURGERY     child  . EYE SURGERY Bilateral    cataract  . right knee meniscus  06/12/2008  . right talus repair     dislocated   Family History:  Family History  Problem Relation Age of Onset  . Alzheimer's disease Mother   . Depression Mother   . Cancer Father        prostate  . Thyroid disease Father   . Alzheimer's disease Maternal Grandmother   . Heart disease Maternal Grandfather   . Stroke Paternal Grandmother   . Stroke Paternal Grandfather   . Alcoholism Brother   . Autism Daughter   . Autism Maternal Uncle    Family Psychiatric  History:  Social History:  Social History   Substance and Sexual Activity  Alcohol Use Yes   Comment: 1-2 shots 5-7 x week     Social History   Substance and Sexual Activity  Drug  Use Yes  . Types: Marijuana   Comment: Occasional    Social History   Socioeconomic History  . Marital status: Legally Separated    Spouse name: [name]  . Number of children: 2  . Years of education: 9  . 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 Financial resource strain: Not on file  . Food insecurity:     Worry: Not on file    Inability: Not on file  . Transportation needs:    Medical: Not on file    Non-medical: Not on file  Tobacco Use  . Smoking status: Never Smoker  . Smokeless tobacco: Never Used  Substance and Sexual Activity  . Alcohol use: Yes    Comment: 1-2 shots 5-7 x week  . Drug use: Yes    Types: Marijuana    Comment: Occasional  . Sexual activity: Never  Lifestyle  . Physical activity:    Days per week: Not on file    Minutes per session: Not on file  . Stress: Not on file  Relationships  . Social connections:    Talks on phone: Not on file    Gets together: Not on file    Attends religious service: Not on file    Active member of club or organization: Not on file    Attends meetings of clubs or organizations: Not on file    Relationship status: Not on file  Other Topics Concern  . Not on file  Social History Narrative   Lives alone retired.  Education: BS, MS Database administrator.  Is separated.  Children son, daughter, 2 gks, mother who is 45yr.  Cokes 0-3 daily.   Additional Social History:    Allergies:   Allergies  Allergen Reactions  . Lamictal [Lamotrigine]     Increased agitation  . Wellbutrin [Bupropion] Anxiety    Patient does not recall allergy type    Labs:  Results for orders placed or performed during the hospital encounter of 06/23/18 (from the past 48 hour(s))  Comprehensive metabolic panel     Status: Abnormal   Collection Time: 06/23/18  6:34 PM  Result Value Ref Range   Sodium 139 135 - 145 mmol/L   Potassium 4.2 3.5 - 5.1 mmol/L   Chloride 106 98 - 111 mmol/L   CO2 24 22 - 32 mmol/L   Glucose, Bld 89 70 - 99 mg/dL   BUN 17 8 - 23 mg/dL   Creatinine, Ser 1.32 (H) 0.61 - 1.24 mg/dL   Calcium 9.2 8.9 - 10.3 mg/dL   Total Protein 6.4 (L) 6.5 - 8.1 g/dL   Albumin 4.1 3.5 - 5.0 g/dL   AST 17 15 - 41 U/L   ALT 14 0 - 44 U/L   Alkaline Phosphatase 46 38 - 126 U/L   Total Bilirubin 0.9 0.3 - 1.2 mg/dL   GFR calc non Af Amer 54 (L) >60  mL/min   GFR calc Af Amer >60 >60 mL/min   Anion gap 9 5 - 15    Comment: Performed at [facility], [place] [address] [place],  [address]  Ethanol     Status: None   Collection Time: 06/23/18  6:34 PM  Result Value Ref Range   Alcohol, Ethyl (B) <10 <10 mg/dL    Comment: (NOTE) Lowest detectable limit for serum alcohol is 10 mg/dL. For medical purposes only. Performed at [facility], [place] [address], [place], [place] [address]   Salicylate level     Status: None   Collection Time: 06/23/18  6:34 PM  Result Value Ref Range   Salicylate Lvl <0.7 2.8 - 30.0 mg/dL    Comment: Performed at [facility], [place] [address], [place], [place] [address]  Acetaminophen level     Status: Abnormal   Collection Time: 06/23/18  6:34 PM  Result Value Ref Range   Acetaminophen (Tylenol), Serum <10 (L) 10 - 30 ug/mL    Comment: (NOTE) Therapeutic concentrations vary significantly. A range of 10-30 ug/mL  may be an effective concentration for many patients. However, some  are best treated at concentrations outside of this range. Acetaminophen concentrations >150 ug/mL at 4 hours after ingestion  and >50 ug/mL at 12 hours after ingestion are often associated with  toxic reactions. Performed at [facility], [place] [address], [place], [place] [address]   cbc     Status: Abnormal   Collection Time: 06/23/18  6:34 PM  Result Value Ref Range   WBC 11.3 (H) 4.0 - 10.5 K/uL   RBC 4.42 4.22 - 5.81 MIL/uL   Hemoglobin 13.6 13.0 - 17.0 g/dL   HCT 42.3 39.0 - 52.0 %   MCV 95.7 80.0 - 100.0 fL   MCH 30.8 26.0 - 34.0 pg   MCHC 32.2 30.0 - 36.0 g/dL   RDW 12.4 11.5 - 15.5 %   Platelets 258 150 - 400 K/uL   nRBC 0.0 0.0 - 0.2 %    Comment: Performed at [facility], [place] [address], [place],  [place] [address]  Rapid urine drug screen (hospital performed)     Status: Abnormal   Collection Time: 06/23/18  6:34  PM  Result Value Ref Range   Opiates NONE DETECTED NONE DETECTED   Cocaine NONE DETECTED NONE DETECTED   Benzodiazepines POSITIVE (A) NONE DETECTED   Amphetamines NONE DETECTED NONE DETECTED   Tetrahydrocannabinol POSITIVE (A) NONE DETECTED   Barbiturates NONE DETECTED NONE DETECTED    Comment: (NOTE) DRUG SCREEN FOR MEDICAL PURPOSES ONLY.  IF CONFIRMATION IS NEEDED FOR ANY PURPOSE, NOTIFY LAB WITHIN 5 DAYS. LOWEST DETECTABLE LIMITS FOR URINE DRUG SCREEN Drug Class                     Cutoff (ng/mL) Amphetamine and metabolites    1000 Barbiturate and metabolites    200 Benzodiazepine                 035 Tricyclics and metabolites     300 Opiates and metabolites        300 Cocaine and metabolites        300 THC                            50 Performed at [facility], [place] [address], [place], [place] [address]   TSH     Status: None   Collection Time: 06/23/18  7:08 PM  Result Value Ref Range   TSH 1.897 0.350 - 4.500 uIU/mL    Comment: Performed by a 3rd Generation assay with a functional sensitivity of <=0.01 uIU/mL. Performed at [facility], [place] [address] [place], [place] [address]   Urinalysis, Routine w reflex microscopic     Status: Abnormal   Collection Time: 06/23/18  7:08 PM  Result Value Ref Range   Color,  Urine YELLOW YELLOW   APPearance CLEAR CLEAR   Specific Gravity, Urine 1.010 1.005 - 1.030   pH 7.0 5.0 - 8.0   Glucose, UA NEGATIVE NEGATIVE mg/dL   Hgb urine dipstick MODERATE (A) NEGATIVE   Bilirubin Urine NEGATIVE NEGATIVE   Ketones, ur NEGATIVE NEGATIVE mg/dL   Protein, ur NEGATIVE NEGATIVE mg/dL   Nitrite NEGATIVE NEGATIVE   Leukocytes,Ua NEGATIVE NEGATIVE   RBC / HPF 0-5 0 - 5 RBC/hpf   WBC, UA 0-5 0 - 5 WBC/hpf   Bacteria, UA NONE SEEN NONE SEEN   Mucus PRESENT     Comment: Performed at [facility], [place] [address] [place], [place] [address]    Current Facility-Administered Medications   Medication Dose Route Frequency Provider Last Rate Last Dose  . memantine (NAMENDA) tablet 10 mg  10 mg Oral QHS Curatolo, Adam, DO   10 mg at 06/23/18 2208  . mirtazapine (REMERON) tablet 15 mg  15 mg Oral QHS Hillard Goodwine, MD      . sertraline (ZOLOFT) tablet 200 mg  200 mg Oral Daily Claretta Kendra, MD      . simvastatin (ZOCOR) tablet 40 mg  40 mg Oral QPM Curatolo, Adam, DO   40 mg at 06/23/18 2209   Current Outpatient Medications  Medication Sig Dispense Refill  . ALPRAZolam (XANAX) 0.5 MG tablet Take 1 tablet (0.5 mg total) by mouth 2 (two) times daily as needed for anxiety. At bedtime as needed 60 tablet 2  . diclofenac sodium (VOLTAREN) 1 % GEL Apply 1 application topically at bedtime as needed for pain.    . memantine (NAMENDA) 10 MG tablet Take 1 tablet (10 mg total) by mouth 2 (two) times daily. (Patient taking differently: Take 10 mg by mouth at bedtime. ) 60 tablet 12  . mirtazapine (REMERON) 15 MG tablet Take 1 tablet (15 mg total) by mouth at bedtime for 30 days. 30 tablet 1  . sertraline (ZOLOFT) 100 MG tablet Take 2 tablets (200 mg total) by mouth daily for 30 days. 60 tablet 1  . simvastatin (ZOCOR) 40 MG tablet Take 40 mg by mouth every evening.    . Tamsulosin HCl (FLOMAX) 0.4 MG CAPS Take 1 mg by mouth daily.      Musculoskeletal: Strength & Muscle Tone: within normal limits Gait & Station: normal Patient leans: N/A  Psychiatric Specialty Exam: Physical Exam  Psychiatric: His speech is normal. Judgment normal. He is slowed and withdrawn. Cognition and memory are impaired. He exhibits a depressed mood. He expresses suicidal ideation. He expresses suicidal plans.    Review of Systems  Constitutional: Negative.   HENT: Negative.   Eyes: Negative.   Respiratory: Negative.   Cardiovascular: Negative.   Gastrointestinal: Negative.   Genitourinary: Negative.   Musculoskeletal: Negative.   Skin: Negative.   Neurological: Negative.   Endo/Heme/Allergies:  Negative.   Psychiatric/Behavioral: Positive for depression, memory loss and suicidal ideas.    Blood pressure 121/72, pulse (!) 53, temperature 97.9 F (36.6 C), temperature source Oral, resp. rate 20, SpO2 100 %.There is no height or weight on file to calculate BMI.  General Appearance: Fairly Groomed  Eye Contact:  Good  Speech:  Clear and Coherent  Volume:  Decreased  Mood:  Dysphoric  Affect:  Constricted  Thought Process:  Coherent and Linear  Orientation:  Full (Time, Place, and Person)  Thought Content:  Logical  Suicidal Thoughts:  Yes.  with intent/plan  Homicidal Thoughts:  No  Memory:  Immediate;   Fair Recent;   Poor Remote;   Fair  Judgement:  Poor  Insight:  Shallow  Psychomotor Activity:  Psychomotor Retardation  Concentration:  Concentration: Fair and Attention Span: Fair  Recall:  [employer] of Knowledge:  Fair  Language:  Good  Akathisia:  No  Handed:  Right  AIMS (if indicated):     Assets:  Communication Skills Desire for Improvement  ADL's:  Intact  Cognition:  Impaired,  Mild  Sleep:   poor     Treatment Plan Summary: Daily contact with patient to assess and evaluate symptoms and progress in treatment and Medication management  -Re-start patient on Zoloft 50 mg daily for depression and Remeron 15 mg daily at bedtime for sleep.   Disposition: Recommend psychiatric Inpatient admission when medically cleared. Supportive therapy provided about ongoing stressors.  Corena Pilgrim, MD 06/24/2018 12:59 PM

## 2018-06-24 NOTE — Progress Notes (Signed)
Per Corena Pilgrim, MD, this pt requires psychiatric hospitalization at this time. CSW faxed information to the following facilities:   [facility], [place]  [phone]

## 2018-06-24 NOTE — ED Notes (Signed)
He is ambulatory without assistance without issue. He has just been escorted from our unit by [facility] Deputy for transport to [facility]. I also notified the intake nurse (per their request) at [facility] that pt. Has just left  Our unit.

## 2018-06-24 NOTE — Progress Notes (Signed)
06/24/2018  1420  Called to cancel [facility] for transport. Called a gave report to [place] at [facility]. Per Diane since the patient has hx of Dementia patient needs to be IVC. Per Cecille Rubin, [place] ask ED MD to IVC patient for transport.

## 2018-06-24 NOTE — Progress Notes (Signed)
06/24/2018  [facility] called to offer patient a bed. Accepting MD is Zara Council # [phone].

## 2018-06-24 NOTE — ED Notes (Signed)
The sheriff is on he way to take pt. To [facility]. He is concerned about not being able to undergo his lithotripsy, which he tells me is supposed to take place tomorrow. The psych. P.A. specifically states pt. Priority is to obtain this necessary psych. Treatment before lithotripsy.

## 2018-06-24 NOTE — B[facility] Counselor (Signed)
Collateral Contact Clinician spoke with David [name], wife, [phone] Wife report husband is depressed due to numerous things including the separation from his wife. Report they have been separated 7 1/2 (8 years in July). Son recently involved in a car accident. Son physically safe but is not financially stable plus his daughter suffers with autism. Report husband is dealing with a variety of things, is on a fixed income and lives by himself. Patient is also suffering financially.  Ex-wife believes client is neglecting self-care (hygiene). Expressed she's concerned about the mental stability of her ex-husband.   Psychiatrist- [facility] Dr. Clovis Pu  Therapist - he has not seen his therapist   Additional factor that is influencing his depression is that his brother never calls and he has no friends.

## 2018-06-24 NOTE — Progress Notes (Signed)
06/24/2018  1403  Called [facility] to transport patient to [facility].

## 2018-06-24 NOTE — Progress Notes (Signed)
06/24/2018  1536  Patient's wife [name] does not want him transported by [facility]. Asked if he can go to another facility. Will notify SW and NP that [name] wants him to go to another facility. [name] states she is his HCPOA.

## 2018-06-25 ENCOUNTER — Ambulatory Visit (HOSPITAL_COMMUNITY): Admission: RE | Admit: 2018-06-25 | Payer: Medicare Other | Source: Home / Self Care | Admitting: Urology

## 2018-06-25 SURGERY — LITHOTRIPSY, ESWL
Anesthesia: LOCAL | Laterality: Left

## 2018-07-03 ENCOUNTER — Telehealth: Payer: Self-pay | Admitting: Diagnostic Neuroimaging

## 2018-07-03 ENCOUNTER — Telehealth: Payer: Self-pay | Admitting: Psychiatry

## 2018-07-03 NOTE — Telephone Encounter (Signed)
Reports that he was admitted to [facility] 06/24/18-06/29/18.  His wife reports that there has been no significant improvement in mood or cognition and no medication changes were made. She reports that they did not receive discharge instructions or plan for home.   Wife reports that she has contacted SW, Letta Median, at [facility] and is awaiting call back. [phone]  Wife reports that he is "sundowning" and becomes increasingly anxious in the evening. She reports that he has been sleeping late. She reports that she is putting his medications in a medication box.   Wife reports that he has been exhibiting shuffling gait and tremor in right hand. She notices a stooped posture. Recommend follow-up with [name] re: concerns about possible Parkinson's.  [facility] and requested copy of D/C Summary. Transferred to voicemail for [facility] and left message with office fax and call back number with request for D/C Summary.

## 2018-07-03 NOTE — Telephone Encounter (Signed)
Pt wife(on DPR) has called out of concern for pt.  Wife states pt was admitted to [facility] on 03-13 and on 03-20 pt was released from [facility].  Wife states pt was send home with prescriptions for medications pt is already on.  Wife states she fears pt may have Parkinsons.  Pt's tremors are worse, pt has had hallucinations , there is an increase of fatigue.  Wife also states there is a horrible order, musty yeast smell on pt even after a shower.  Please call wife

## 2018-07-03 NOTE — Telephone Encounter (Signed)
Left another message with [facility] requesting return call and/or fax of D/C summary. Contacted pt's wife to communicate that messages have been left and that Discharge Summary has not yet been received.

## 2018-07-03 NOTE — Telephone Encounter (Signed)
Called wife, [name] and advised her of Dr Gladstone Lighter reply. He has appointment on 09/12/18 already in place; she will keep that one. She thanked me for calling her back, verbalized understanding, appreciation.

## 2018-07-03 NOTE — Telephone Encounter (Signed)
Patient's spouse [name] left vm on 03/23 @4 :23 pm stated patient was involuntary admitted to [facility] on 03/15 and Discharged 03/20 was sent home with no Discharge paperwork only medication with no instructions.  Patient seems to be back to himself but [name] would like for you to call her at [phone].

## 2018-07-03 NOTE — Telephone Encounter (Signed)
Follow up with PCP. Recommend follow up visit in neurology in June 2020.

## 2018-07-03 NOTE — Telephone Encounter (Signed)
Called wife and advised her that she should contact PCP about husband's fatigue and yeast odor. Advised the patient may need medications, may need labs. I asked if he is on new meds; she stated Remeron was started before his hospitalization.  No other new meds. She stated she is concerned he has Parkinsons because of his symptoms. He was unable to do neuropsych testing because that provider left the practice.  I advised her our office is only seeing patients on urgent basis due to COVID 19, but we are doing tele and video visits. I advised will let Dr Leta Baptist know of patient's symptoms and call her back. She verbalized understanding, appreciation.

## 2018-07-03 NOTE — Telephone Encounter (Signed)
Patient's wife call and wants to talk to you about David [name]'s hospiltalization and said that he didn't have any discharge information. [phone]

## 2018-07-04 NOTE — Telephone Encounter (Signed)
Leda Gauze do you know anything about this? Seems like it's ongoing about getting records of his hospitalization, do we need to send a consent or anything?

## 2018-07-04 NOTE — Telephone Encounter (Signed)
Pt's wife, [name] returned your call. Wants to know if you have gotten any info yet and let you know he is having multiple panic attacks per day.

## 2018-07-04 NOTE — Telephone Encounter (Signed)
No I don't know anything about this but will follow up. If we don't have a consent form they will not send.

## 2018-07-06 ENCOUNTER — Telehealth: Payer: Self-pay | Admitting: Psychiatry

## 2018-07-06 NOTE — Telephone Encounter (Signed)
[name] called and wanted you to the know the status of David [name]. Do you want someone else to do video call with him on Tuesday. March 31st please call her at [phone]

## 2018-07-09 NOTE — Telephone Encounter (Signed)
Spoke with David [name], she's not sure why a consent is needed for the discharge paperwork due to him being a patient of Jessica's, she was unaware of anything she is suppose to be doing to provide a release. If someone can touch base with her on specifics.   As far as David [name] he's extremely nervous, anxious, to the point of shaking. She doesn't feel an increase in alprazolam would help maybe worsen symptoms. She does set up his medication for him but he still remains confused with that. She said he's just as bad as he was prior to the admission to the hospital. Not sure if he's sleeping at night, she did call him earlier and he was napping. She's very aggravated with the hospital and not getting in direction or instructions. Was very appreciative of me calling to ask about him. He does continue with the "sundowning" in the evenings as well.   Please advise

## 2018-07-10 ENCOUNTER — Ambulatory Visit: Payer: Medicare Other | Admitting: Psychiatry

## 2018-07-10 ENCOUNTER — Telehealth: Payer: Self-pay | Admitting: Psychiatry

## 2018-07-10 NOTE — Telephone Encounter (Signed)
No not at all she just thought he needed more than just that to help his anxiety

## 2018-07-10 NOTE — Telephone Encounter (Signed)
A refill request came in by fax for this pt from [facility].  Alprazolam 0.5mg  1 bid prn anxiety.  But they wrote in that the RX is 8 days early. They have a store policy that they will fill 2 days early  Unless the MD authorizes it earlier.  Pt tells pharmacy he can't find his and he is out.  [place]

## 2018-07-10 NOTE — Telephone Encounter (Signed)
Spoke with Ronalee Belts pharmacist and ok early refill for pt, they will use the one on file. #[phone]

## 2018-07-10 NOTE — Telephone Encounter (Signed)
David [name] has communicated with wife that we need a signed consent to fax over to the hospital. She has not provided that yet.

## 2018-07-24 ENCOUNTER — Telehealth: Payer: Self-pay | Admitting: Psychiatry

## 2018-07-24 NOTE — Telephone Encounter (Signed)
David [name] stated she left a msg., last week stating that patient is still experiencing anxiety and confusion wants to know what steps she can take next? Patient is taking medications as scheduled

## 2018-07-25 NOTE — Telephone Encounter (Signed)
We have this patient on the cancellation list.  He needs to bring someone who can help him with his pillbox to the telephone appointment.  He has been confused and needs help with his medication.

## 2018-07-25 NOTE — Telephone Encounter (Signed)
Patient is scheduled for 11:30 tomorrow 04/16, the ex-wife [name] will be there and the phone number will be hers

## 2018-07-26 ENCOUNTER — Ambulatory Visit (INDEPENDENT_AMBULATORY_CARE_PROVIDER_SITE_OTHER): Payer: Medicare Other | Admitting: Psychiatry

## 2018-07-26 ENCOUNTER — Encounter: Payer: Self-pay | Admitting: Psychiatry

## 2018-07-26 ENCOUNTER — Other Ambulatory Visit: Payer: Self-pay

## 2018-07-26 DIAGNOSIS — F5105 Insomnia due to other mental disorder: Secondary | ICD-10-CM | POA: Diagnosis not present

## 2018-07-26 DIAGNOSIS — F411 Generalized anxiety disorder: Secondary | ICD-10-CM

## 2018-07-26 DIAGNOSIS — G3184 Mild cognitive impairment, so stated: Secondary | ICD-10-CM | POA: Diagnosis not present

## 2018-07-26 DIAGNOSIS — F39 Unspecified mood [affective] disorder: Secondary | ICD-10-CM

## 2018-07-26 MED ORDER — PAROXETINE HCL 20 MG PO TABS: ORAL_TABLET | ORAL | 1 refills | Status: DC

## 2018-07-26 MED ORDER — LORAZEPAM 1 MG PO TABS: 1.0000 mg | ORAL_TABLET | Freq: Two times a day (BID) | ORAL | 0 refills | Status: DC | PRN

## 2018-07-26 NOTE — Progress Notes (Signed)
David [name] [id number] [birth year] 73 y.o.  Subjective:   Patient ID:  David [name] is a 73 y.o. (DOB 1945-09-09) male.  Chief Complaint:  Chief Complaint  Patient presents with  . Follow-up    Medication Management  . Altered Mental Status  . Anxiety  . Depression  . Panic Attack    HPI David [name] presents urgently today for follow-up of mood and anxiety and confusion.  He had a recent psychiatric hospitalization in mid March for suicidal thoughts depressive symptoms and anxiety symptoms.  TC to his wife David [name] and David [name].  CC anx and confusion.  Lose track.  Take a lot of meds and lose track of how to take them.  Got a dispenser that [place] set up for him.  He thinks he may still mess it up.  He questions the dispenser.  Difficult to just trust it. Anxiety worsening depression.  Fear of making a mistake.  W anxiety for months.  Confusion also months about Thanksgiving.  Can't handle money and bills without help.  Takes Xanax daily but not over 2 daily.  History of anxiety since high school.  First tx for anxiety 2006 and no confusion then.  Hospitalized with nervous breakdown.  On multiple psych meds.  Lives alone.  Eats out usually. Can prepare sandwich.  anxieous a night and fitful sleep.  Can sleep 4-6 hours fitful.  .  Other symptoms remain unchanged as reported here: He reports "a lot of anxiety, and a lot of confusion." He reports that he did not recognize provider today and has had difficulty recognizing others. Reports that he has been having difficulty tracking whether he has taken certain medications. He reports that his anxiety is severe at times. He reports that he continues to feel restless. Reports occasional difficulty with sitting still. Reports that he often has anxiety about his confusion and thinking he has forgotten to do something he needed to do or has missed or over-taken medications."It's fear that I have forgotten to do something, or if I do,  it's the wrong thing."  He reports that he is not sure if there has been any benefit with increase in Sertraline. He reports experiencing panic attacks "a couple of times a week." Frequent worry. He reports that mood has been depressed. He reports episodic irritability- "it's there more often than not." He reports that he has irritability with "estranged spouse." He describes sleep as "fitful" with difficulty staying asleep and occasional trouble falling asleep. Appetite has been decreased and typically is eating one meal a day. He reports low energy and low motivation. Reports significant difficulty with concentration and focus. He reports vague SI without intent or plan.   Denies impulsive or risky behaviors.  Reports that he is "not sure" if he is experiencing AH or VH.   He reports that he has been receiving limited assistance from wife.   Reports that he has not seen therapist recently. Reports that he saw PCP yesterday and does not recall details of this visit and initially could not remember being seen.   Past Psychiatric Medication Trials: Rexulti- helpful for mood. Possible akathisia Sertraline- has been helpful for mood and anxiety. Taken since 2006. Has taken up to 300 mg po qd Effexor Cymbalta- was effective and then stopped Prozac Abilify-"more confused" and possible paradoxical reaction. Rexulti- Possible akathisia at higher dose Buspar Propranolol- Not effective Lamictal- increased agitation Wellbutrin- Adverse effects, increased agitation Alprazolam- Effective Ativan- Ineffective Valium- Calming effect Ambien- Parasomnia  Review of Systems:  Review of Systems  Constitutional: Negative for chills and fever.  Gastrointestinal: Positive for nausea and vomiting.  Genitourinary: Positive for urgency.       Reports that he had some hematuria and this has resolved.   Musculoskeletal: Negative for gait problem.  Neurological: Negative for tremors.  Psychiatric/Behavioral:        Please refer to HPI    Medications: I have reviewed the patient's current medications.  Current Outpatient Medications  Medication Sig Dispense Refill  . ALPRAZolam (XANAX) 0.5 MG tablet Take 1 tablet (0.5 mg total) by mouth 2 (two) times daily as needed for anxiety. At bedtime as needed 60 tablet 2  . aspirin EC 81 MG tablet Take 81 mg by mouth daily.    . memantine (NAMENDA) 10 MG tablet Take 1 tablet (10 mg total) by mouth 2 (two) times daily. 60 tablet 12  . sertraline (ZOLOFT) 100 MG tablet Take 2 tablets (200 mg total) by mouth daily for 30 days. 60 tablet 1  . simvastatin (ZOCOR) 40 MG tablet Take 40 mg by mouth every evening.    . Tamsulosin HCl (FLOMAX) 0.4 MG CAPS Take 1 mg by mouth daily.     No current facility-administered medications for this visit.     Medication Side Effects: None  Allergies:  Allergies  Allergen Reactions  . Lamictal [Lamotrigine]     Increased agitation  . Wellbutrin [Bupropion] Anxiety    Patient does not recall allergy type    Past Medical History:  Diagnosis Date  . Anxiety   . Depression   . Hyperlipidemia   . Hypertension   . Sleep apnea    CPAP    Family History  Problem Relation Age of Onset  . Alzheimer's disease Mother   . Depression Mother   . Cancer Father        prostate  . Thyroid disease Father   . Alzheimer's disease Maternal Grandmother   . Heart disease Maternal Grandfather   . Stroke Paternal Grandmother   . Stroke Paternal Grandfather   . Alcoholism Brother   . Autism Daughter   . Autism Maternal Uncle     Social History   Socioeconomic History  . Marital status: Legally Separated    Spouse name: David [name]  . Number of children: 2  . Years of education: 30  . 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 Financial resource strain: Not on file  . Food insecurity:    Worry: Not on file    Inability: Not on file  . Transportation  needs:    Medical: Not on file    Non-medical: Not on file  Tobacco Use  . Smoking status: Never Smoker  . Smokeless tobacco: Never Used  Substance and Sexual Activity  . Alcohol use: Yes    Comment: 1-2 shots 5-7 x week  . Drug use: Yes    Types: Marijuana    Comment: Occasional  . Sexual activity: Never  Lifestyle  . Physical activity:    Days per week: Not on file    Minutes per session: Not on file  . Stress: Not on file  Relationships  . Social connections:    Talks on phone: Not on file    Gets together: Not on file    Attends religious service: Not on file    Active member of club or organization: Not on file  Attends meetings of clubs or organizations: Not on file    Relationship status: Not on file  . Intimate partner violence:    Fear of current or ex partner: Not on file    Emotionally abused: Not on file    Physically abused: Not on file    Forced sexual activity: Not on file  Other Topics Concern  . Not on file  Social History Narrative   Lives alone retired.  Education: BS, MS Database administrator.  Is separated.  Children son, daughter, 2 gks, mother who is 54yr.  Cokes 0-3 daily.    Past Medical History, Surgical history, Social history, and Family history were reviewed and updated as appropriate.   Please see review of systems for further details on the patient's review from today.   Objective:   Physical Exam:  There were no vitals taken for this visit.  Physical Exam Constitutional:      Appearance: He is well-developed. He is not diaphoretic.  Neurological:     Mental Status: He is alert. He is disoriented.     Cranial Nerves: No dysarthria.  Psychiatric:        Attention and Perception: Perception normal. He is inattentive. He does not perceive auditory or visual hallucinations.        Mood and Affect: Mood is anxious and depressed. Affect is not labile, blunt, angry or inappropriate.        Speech: Speech normal.        Behavior: Behavior is  cooperative.        Thought Content: Thought content normal. Thought content does not include homicidal or suicidal ideation. Thought content does not include homicidal or suicidal plan.        Cognition and Memory: Cognition is impaired. Memory is impaired. He exhibits impaired recent memory.        Judgment: Judgment is inappropriate.     Comments: Insight intact. No delusions.  MMSE=23/30 on March 10        Lab Review:     Component Value Date/Time   NA 139 06/23/2018 1834   K 4.2 06/23/2018 1834   CL 106 06/23/2018 1834   CO2 24 06/23/2018 1834   GLUCOSE 89 06/23/2018 1834   BUN 17 06/23/2018 1834   CREATININE 1.32 (H) 06/23/2018 1834   CALCIUM 9.2 06/23/2018 1834   PROT 6.4 (L) 06/23/2018 1834   ALBUMIN 4.1 06/23/2018 1834   AST 17 06/23/2018 1834   ALT 14 06/23/2018 1834   ALKPHOS 46 06/23/2018 1834   BILITOT 0.9 06/23/2018 1834   GFRNONAA 54 (L) 06/23/2018 1834   GFRAA >60 06/23/2018 1834       Component Value Date/Time   WBC 11.3 (H) 06/23/2018 1834   RBC 4.42 06/23/2018 1834   HGB 13.6 06/23/2018 1834   HCT 42.3 06/23/2018 1834   PLT 258 06/23/2018 1834   MCV 95.7 06/23/2018 1834   MCH 30.8 06/23/2018 1834   MCHC 32.2 06/23/2018 1834   RDW 12.4 06/23/2018 1834    No results found for: POCLITH, LITHIUM   No results found for: PHENYTOIN, PHENOBARB, VALPROATE, CBMZ   .res Assessment: Plan:     Generalized anxiety disorder  Mood disorder ([place])  Insomnia due to mental condition  Mild cognitive impairment  The patient has likely mild dementia versus mild cognitive impairment he is impaired in his ability to care for himself he lives alone.  This is a problem I emphasized the need for him to work to come to  a solution where either someone is living with him where he is living with someone else or he considers assisted-living placement because this is not safe for him to continue to live alone.  They will discuss this.  His ex-wife is helpful and involved  and this is encouraged. Apparently he has already had a cognitive dysfunction physical work-up including labs.  Plan: Rec not live alone.  No option to live with anyone.  Switch to paroxetine bc it's usually better for anxiety. Reduce sertraline 1 1/2 tablets and start 1/2 paroxetine for 5 days, Then reduce sertraline 1 daily and increase paroxetine to 1 daily for 5 days, Then reduce sertraline 1/2 daily and increase paroxetine to 1 1/2 daily for 5 days, Then stop sertraline and increase paroxetine to 2 of 20 mg daily.  Stop Xanax and start lorazepam 1 mg twice daily for panic attacks to see if cognition will be better bc sometimes Ativan is less impairing of cognition.    Consider Aricept or low dose lithium for cognition.  Avoid alcohol.  Stop drinking entirely.  Emphasize this in detail.   DC mirtazapine bc seemed to worsen cognition.  Improved some off of it.  Option trazodone for sleep if needed.  But prefer to avoid adding more sedatives due to his confusion.  Discussed side effects of each medication in detail.  Patient advised to contact office with any questions, adverse effects, or acute worsening in signs and symptoms.  This was a 50-minute appointment  .FU 4 weeks  I connected with patient by a video enabled telemedicine application or telephone, with their informed consent, and verified patient privacy and that I am speaking with the correct person using two identifiers.  I was located at office and patient at home.  Lynder Parents, MD, DFAPA    Future Appointments  Date Time Provider [place]  09/12/2018 10:00 AM Penumalli, Earlean Polka, MD [facility] None    No orders of the defined types were placed in this encounter.     -------------------------------

## 2018-07-31 ENCOUNTER — Telehealth: Payer: Self-pay

## 2018-07-31 NOTE — Telephone Encounter (Signed)
Prior authorization submitted for Paroxetine 20 mg/2 daily through [employer] approved: [id number]. through 04/11/2019.

## 2018-08-16 ENCOUNTER — Telehealth: Payer: Self-pay | Admitting: Psychiatry

## 2018-08-16 NOTE — Telephone Encounter (Signed)
Have seen, will be willing to see PT.  - AM

## 2018-08-16 NOTE — Telephone Encounter (Signed)
Wife reports that during last visit with Dr. Clovis Pu there was some mention re: pt seeing [name], PhD. She wanted to inquire about this and to determine if pt could be seen by Dr. Rica Mote.   She reports that she has seen some improvement in anxiety with switch to Paxil. She reports that she has noticed some ritualized behaviors. Wife reports that he continues to have difficulty accepting that they will not get back together.  She reports that she has seen some slight improvement in his cognitive function with change from Xanax to Ativan.   Will staff case with [name], PhD.

## 2018-08-17 ENCOUNTER — Ambulatory Visit (INDEPENDENT_AMBULATORY_CARE_PROVIDER_SITE_OTHER): Payer: Medicare Other | Admitting: Psychiatry

## 2018-08-17 ENCOUNTER — Other Ambulatory Visit: Payer: Self-pay

## 2018-08-17 ENCOUNTER — Encounter: Payer: Self-pay | Admitting: Psychiatry

## 2018-08-17 DIAGNOSIS — F39 Unspecified mood [affective] disorder: Secondary | ICD-10-CM | POA: Diagnosis not present

## 2018-08-17 DIAGNOSIS — F5105 Insomnia due to other mental disorder: Secondary | ICD-10-CM | POA: Diagnosis not present

## 2018-08-17 DIAGNOSIS — F411 Generalized anxiety disorder: Secondary | ICD-10-CM

## 2018-08-17 NOTE — Progress Notes (Addendum)
David [name] [id number] [birth year] 73 y.o.  Virtual Visit via Telephone Note  I connected with pt on 08/17/18 at  3:45 PM EDT by telephone and verified that I am speaking with the correct person using two identifiers.   I discussed the limitations, risks, security and privacy concerns of performing an evaluation and management service by telephone and the availability of in person appointments. I also discussed with the patient that there may be a patient responsible charge related to this service. The patient expressed understanding and agreed to proceed.   I discussed the assessment and treatment plan with the patient. The patient was provided an opportunity to ask questions and all were answered. The patient agreed with the plan and demonstrated an understanding of the instructions.   The patient was advised to call back or seek an in-person evaluation if the symptoms worsen or if the condition fails to improve as anticipated.  I provided 45 minutes of non-face-to-face time during this encounter.  The patient was located at home.  The provider was located at home.   [name], PMHNP   Subjective:   Patient ID:  David [name] is a 73 y.o. (DOB Feb 21, 1946) male.  Chief Complaint:  Chief Complaint  Patient presents with  . Anxiety  . Memory Loss  . Depression  . Insomnia    HPI David [name] presents for follow-up of anxiety, depression, insomnia, and memory loss.  His wife (separated), [name], also participates in tele-visit with patient's consent.  Patient reports that he is "Very confused and very stressed." He reports difficulty keeping track of his medications and finances. He reports that he gets anxious about setting out his medication. [name] reports that his medications are set up in an electronic medication dispenser. He denies any significant changes in his confusion or anxiety. He reports that his anxiety is "very high... I am agitated." They report that he is  having multiple panic attacks daily.   [name] reports that he has been obsessing about medications. She reports that anxiety seemed to improve briefly and then seems to be worsening again. Describes sleep as marginal and has some difficulty falling asleep. [name] reports that she is encouraging him to take take Ativan at bedtime. He reports some increase in agitation. He reports that he has been irritable. "I feel very sad." He reports eating "one significant meal a day." [name] reports that he eats well when he is with her. He describes his energy and motivation as sporadic.  He reports SI without intent. Reports he has fleeting suicidal thoughts with plan that he reports that he could not act upon. He denies SI at time of exam. Contracts for safety. [name] reports that SI seems to correlate with him having increased anxiety when he is not able to find things or recall things. [name] reports that he has been searching for his mother's will today and has been unable to locate this.   He reports that he has been going for long walks and enjoying this.  Has been on Paxil 40 mg po qd for several days.  Cannot move in with son or wife. He questions if he has the resources for ALF.   Wife reports that [facility] will start sending the medication in blister pack to help with medication compliance and to try to decrease pt's anxiety re: medications.   Past Psychiatric Medication Trials: Rexulti- helpful for mood. Possible akathisia Sertraline- has been helpful for mood and anxiety. Taken since 2006. Has  taken up to 300 mg po qd Effexor Cymbalta- was effective and then stopped Prozac Abilify-"more confused" and possible paradoxical reaction. Rexulti- Possible akathisia at higher dose Buspar Propranolol- Not effective Lamictal- increased agitation Wellbutrin- Adverse effects, increased agitation Alprazolam- Effective Ativan- Ineffective Valium- Calming effect Ambien- Parasomnia Remeron- May  have caused worsening confusion.  Paxil  Review of Systems:  Review of Systems  Musculoskeletal: Positive for arthralgias. Negative for gait problem.  Neurological: Positive for tremors. Negative for headaches.       Reports tremors in right hand  Psychiatric/Behavioral:       Please refer to HPI    Medications: I have reviewed the patient's current medications.  Current Outpatient Medications  Medication Sig Dispense Refill  . aspirin EC 81 MG tablet Take 81 mg by mouth daily.    Marland Kitchen LORazepam (ATIVAN) 1 MG tablet Take 1 tablet (1 mg total) by mouth 2 (two) times daily as needed for anxiety. 60 tablet 0  . memantine (NAMENDA) 10 MG tablet Take 1 tablet (10 mg total) by mouth 2 (two) times daily. 60 tablet 12  . PARoxetine (PAXIL) 20 MG tablet 1/2 tablet daily for 5 days, then 1 tablet daily for 5 days, then 1-1/2 tablets daily for 5 days, then 1 tablet daily (Patient taking differently: Take 40 mg by mouth daily. 1/2 tablet daily for 5 days, then 1 tablet daily for 5 days, then 1-1/2 tablets daily for 5 days, then 1 tablet daily) 60 tablet 1  . simvastatin (ZOCOR) 40 MG tablet Take 40 mg by mouth every evening.    . Tamsulosin HCl (FLOMAX) 0.4 MG CAPS Take 1 mg by mouth daily.    . Vitamin D, Ergocalciferol, (DRISDOL) 1.25 MG (50000 UT) CAPS capsule Take 50,000 Units by mouth every 7 (seven) days.     No current facility-administered medications for this visit.     Medication Side Effects: None  Allergies:  Allergies  Allergen Reactions  . Lamictal [Lamotrigine]     Increased agitation  . Wellbutrin [Bupropion] Anxiety    Patient does not recall allergy type    Past Medical History:  Diagnosis Date  . Anxiety   . Depression   . Hyperlipidemia   . Hypertension   . Sleep apnea    CPAP    Family History  Problem Relation Age of Onset  . Alzheimer's disease Mother   . Depression Mother   . Cancer Father        prostate  . Thyroid disease Father   . Alzheimer's  disease Maternal Grandmother   . Heart disease Maternal Grandfather   . Stroke Paternal Grandmother   . Stroke Paternal Grandfather   . Alcoholism Brother   . Autism Daughter   . Autism Maternal Uncle     Social History   Socioeconomic History  . Marital status: Legally Separated    Spouse name: [name]  . Number of children: 2  . Years of education: 25  . 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 Financial resource strain: Not on file  . Food insecurity:    Worry: Not on file    Inability: Not on file  . Transportation needs:    Medical: Not on file    Non-medical: Not on file  Tobacco Use  . Smoking status: Never Smoker  . Smokeless tobacco: Never Used  Substance and Sexual Activity  . Alcohol use: Yes  Comment: 1-2 shots 5-7 x week  . Drug use: Yes    Types: Marijuana    Comment: Occasional  . Sexual activity: Never  Lifestyle  . Physical activity:    Days per week: Not on file    Minutes per session: Not on file  . Stress: Not on file  Relationships  . Social connections:    Talks on phone: Not on file    Gets together: Not on file    Attends religious service: Not on file    Active member of club or organization: Not on file    Attends meetings of clubs or organizations: Not on file    Relationship status: Not on file  . Intimate partner violence:    Fear of current or ex partner: Not on file    Emotionally abused: Not on file    Physically abused: Not on file    Forced sexual activity: Not on file  Other Topics Concern  . Not on file  Social History Narrative   Lives alone retired.  Education: BS, MS Database administrator.  Is separated.  Children son, daughter, 2 gks, mother who is 5yr.  Cokes 0-3 daily.    Past Medical History, Surgical history, Social history, and Family history were reviewed and updated as appropriate.   Please see review of systems for further details on the patient's  review from today.   Objective:   Physical Exam:  There were no vitals taken for this visit.  Physical Exam Neurological:     Mental Status: He is alert and oriented to person, place, and time.     Cranial Nerves: No dysarthria.  Psychiatric:        Attention and Perception: He is inattentive.        Mood and Affect: Mood is anxious and depressed.        Speech: Speech normal.        Behavior: Behavior is agitated and hyperactive. Behavior is cooperative.        Thought Content: Thought content is not paranoid or delusional. Thought content does not include homicidal or suicidal ideation. Thought content does not include homicidal or suicidal plan.        Cognition and Memory: Cognition is impaired. Memory is impaired. He exhibits impaired recent memory.        Judgment: Judgment is inappropriate.     Comments: Insight intact.  Pt presents as restless and obsessive on exam as evidenced by periodically getting out of his wife's vehicle and going to his vehicle to check on something during the exam.      Lab Review:     Component Value Date/Time   NA 139 06/23/2018 1834   K 4.2 06/23/2018 1834   CL 106 06/23/2018 1834   CO2 24 06/23/2018 1834   GLUCOSE 89 06/23/2018 1834   BUN 17 06/23/2018 1834   CREATININE 1.32 (H) 06/23/2018 1834   CALCIUM 9.2 06/23/2018 1834   PROT 6.4 (L) 06/23/2018 1834   ALBUMIN 4.1 06/23/2018 1834   AST 17 06/23/2018 1834   ALT 14 06/23/2018 1834   ALKPHOS 46 06/23/2018 1834   BILITOT 0.9 06/23/2018 1834   GFRNONAA 54 (L) 06/23/2018 1834   GFRAA >60 06/23/2018 1834       Component Value Date/Time   WBC 11.3 (H) 06/23/2018 1834   RBC 4.42 06/23/2018 1834   HGB 13.6 06/23/2018 1834   HCT 42.3 06/23/2018 1834   PLT 258 06/23/2018 1834   MCV 95.7  06/23/2018 1834   MCH 30.8 06/23/2018 1834   MCHC 32.2 06/23/2018 1834   RDW 12.4 06/23/2018 1834    No results found for: POCLITH, LITHIUM   No results found for: PHENYTOIN, PHENOBARB,  VALPROATE, CBMZ   .res Assessment: Plan:   Case staffed with Dr. Clovis Pu. Discussed treatment options and safety plan at length.  Discussed concerns about patient safety and offered option of psychiatric hospitalization.  While discussing plan, patient received a call regarding his mother possibly having severe health issues and he reports that he needs to be available to assist with his mother's care and is forward thinking.  Both patient and his wife agree that hospitalization at this time would likely increase his anxiety with inability to be involved in his mother's care and not knowing her status.  Patient contracts fully for safety and reports that he would notify [name] or call 911 if he was having thoughts of suicide and agrees not to harm himself.  He agrees to having [name] take him to the hospital if he begins to experience suicidal intent.  [name] agrees to be available in the event of patient needing transportation to the hospital and/or other support.  Patient and Rudene Anda instructed to call office if he experiences any worsening in signs and symptoms and they agree to do so.  Discussed continuing current plan of care since more time is needed to assess response to recent med changes and since he has just started Paxil 40 mg within the last several days.  Recommended not adding an additional medication for insomnia since this could worsen cognitive impairment.  Discussed Dr. Casimiro Needle previous recommendation that patient should not live alone and that [place] may have Social Worker that may be available to assist patient with exploring options for increased level of care. Pt agrees to plan.   Encourage patient to consider resuming psychotherapy and patient reports that seeing Pervis Hocking, PhD has been cost prohibitive since visits are not covered by his insurance.  Discussed that [name], PhD is available to provide psychotherapy and patient is amenable to this.  Will assist  patient with scheduling appointment.  Patient to follow-up with this provider in 2 weeks or sooner if clinically indicated.   Mood disorder (HCC)  Generalized anxiety disorder  Insomnia due to mental condition  Please see After Visit Summary for patient specific instructions.  Future Appointments  Date Time Provider [place]  09/12/2018 10:00 AM Penumalli, Earlean Polka, MD [facility] None    No orders of the defined types were placed in this encounter.     -------------------------------

## 2018-08-27 ENCOUNTER — Other Ambulatory Visit: Payer: Self-pay | Admitting: Psychiatry

## 2018-08-27 ENCOUNTER — Telehealth: Payer: Self-pay | Admitting: Psychiatry

## 2018-08-27 NOTE — Telephone Encounter (Signed)
David [name] reports that pt has been using blister packs and this seems to be working well. She reports that David [name] seems less anxious about his medications since he started using blister packs.   Wife reports that he gave her a card for Mother's day asking for her to give him one more chance. She reports that he has mentioned reconciliation several times to her.   Pt's mother has been diagnosed with COVID 50 and that his mother has been unresponsive today.  Discussed that information provided would also be shared with [name], PhD in preparation for their visit.

## 2018-08-28 ENCOUNTER — Other Ambulatory Visit: Payer: Self-pay

## 2018-08-28 MED ORDER — LORAZEPAM 1 MG PO TABS: 1.0000 mg | ORAL_TABLET | Freq: Two times a day (BID) | ORAL | 0 refills | Status: DC | PRN

## 2018-08-28 NOTE — Telephone Encounter (Signed)
I believe he's taking 40 mg now?

## 2018-08-31 ENCOUNTER — Other Ambulatory Visit: Payer: Self-pay

## 2018-08-31 ENCOUNTER — Ambulatory Visit (INDEPENDENT_AMBULATORY_CARE_PROVIDER_SITE_OTHER): Payer: Medicare Other | Admitting: Psychiatry

## 2018-08-31 ENCOUNTER — Ambulatory Visit: Payer: Medicare Other | Admitting: Psychiatry

## 2018-08-31 DIAGNOSIS — F39 Unspecified mood [affective] disorder: Secondary | ICD-10-CM

## 2018-08-31 DIAGNOSIS — G3184 Mild cognitive impairment, so stated: Secondary | ICD-10-CM | POA: Diagnosis not present

## 2018-08-31 DIAGNOSIS — F411 Generalized anxiety disorder: Secondary | ICD-10-CM

## 2018-08-31 DIAGNOSIS — F5105 Insomnia due to other mental disorder: Secondary | ICD-10-CM | POA: Diagnosis not present

## 2018-08-31 DIAGNOSIS — F6589 Other paraphilias: Secondary | ICD-10-CM

## 2018-08-31 NOTE — Progress Notes (Signed)
[facility] Initial Evaluation  Name: David [name] MRN: [id number] DOB: 06/25/1945 Date: 08/31/2018  PCP: David Ruff, MD  Time spent: 34 min  Estranged wife David [name] is with PT, assisting, due to confusion and difficulty both remembering and organizing session.    Telehealth visit I connected with patient by a video enabled telemedicine/telehealth application or telephone, with his informed consent, and verified patient privacy and that I am speaking with the correct person using two identifiers.  I was located at my home and patient at his car.  We discussed the limitations, risks, and security and privacy concerns associated with telehealth services and the availability of in-person appointments, including awareness that he may be responsible for charges related to the service, and he expressed understanding and agreed to proceed.  I discussed treatment planning with him, with opportunity to ask and answer all questions. Agreed with the plan, demonstrated an understanding of the instructions, and made him aware to call our office if symptoms worsen or he feels he is in a crisis state and needs immediate contact.  Contact by phone, with PT and estranged wife together on phone in car.  Guardian/Payee: self  Paperwork requested:  No   Reason for Visit /Presenting Problem:  Chief Complaint  Patient presents with  . Establish Care  . Anxiety  . Depression  . Other  Referred by David Headings, NP, for treatment of confusion, dependency, and brittle reactions to life changes.  Recently known to have been hospitalized, involuntarily, and failed to organize medications.  Requires frequent contact and management by estranged wife and medication preparation by pharmacy in blister packs.  Narrative/History of Present Illness Is dealing with separation, cognitive impairment, and for all intents and purposes, mother terminal with David [name].  Mother is in David [name], and PT will be  responsible for her final arrangements, working together with David [name].  Anticipates a video memorial/kaddish.  PT says he is very distraught.  Laboring to let himself feel the grief, as she is almost certainly terminal, at 86, though she has always been very durable in her [name].  Feels he has omitted something, maybe failed his mother.  Felt for a long time that he has failed her.  Father was cognitively alert before his death and was able to speak release before passing, and say goodbye.  Father was extraordinarily generous and kind, set a wonderful, impossible, example.  Mother had very high standards, was "severe", high-school educated.  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  Says he does not blame David [name] for separation, mightily regrets his paraphilia (masturbating to underwear of women he is acquainted with).  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  Separation 8 years now, happened near birthday.  PT struggles with the idea that they may not reconcile.  Typically, they do not date but they show up for family occasions.  Daughter David [name] [phone]) was 3 months premature, has high-functioning autism, lives with David [name], is a musical savant, ambition to sing at the David [name].  Visually impaired, requires drivers, and history of PT alienating caregivers.    Wants to be closer to his daughter than he is, grieves the lost time.  According to PT, David [name] "wants to have sex with me", which David [name] immediately disputes.  Says actually David [name] wants to have an adult sexual relationship, and she had a boyfriend who got handsy, had to stop.  Daughter has ASD, with obsessive interest in opera and excellent voice.  Considered socially unsuited to lie on her own, lives with David [name] full time.   The reason they no longer live together, per David [name], is that David [name] was verbally, sexually inappropriate  with agency workers, and two agencies have quit service over it.  Current service has noted a complaint, latest a year and a half ago, which changed the rules of visitation to all visitation by David [name]'s consent.  Also PT cannot manage David [name]'s behavior issues, and so requires staffing.  Had been a pattern before David [name] of Friday pickups, Friday shabbat dinner most weeks.  Son David [name] and his kids were more part of it, but his hours.  May do shabbat breakfast instead on Saturday, but e counts on contact.  Conflict breaks out between David when share too much time; David [name] becomes too clingy, maudlin, or defensive, and testy words break out.  Meanwhile, David [name] asserts she overmanages time and expects too much, may be harsh with David [name].  History of 2 admissions to David [name] for depression and suicidality, last time mid-March.  Committed/transferred to David [name] for unclear reasons, extremely disruptive according to David [name].  Distressed about what to do for his ongoing safety and wellbeing.  Mental Status Exam: Appearance:   Not assessed     Behavior:  difficult to assess -- somewhat histrionic with sensitive subjects, rumored passive-aggressive  Motor:  Not assessed  Speech/Language:   generally clear  Affect:  Not assessed  Mood:  anxious and dysthymic  Thought process:  grossly intact  Thought content:    possible illusions/delusions  Sensory/Perceptual disturbances:    WNL  Orientation:  Not clearly assessed  Attention:  Fair  Concentration:  Poor  Memory:  likely confabulations, notable uncertainty and difficulty remembering accurately  Fund of knowledge:   Good  Insight:    Poor  Judgment:   Fair  Impulse Control:  Fair   Risk Assessment: Danger to Self: not in evidence; has been alleged self-hazard getting mixed up on medications Self-injurious Behavior: No Danger to Others: No Duty to Warn: no Physical Aggression / Violence: No  Access to Firearms a concern: No  Gang  Involvement: No  Patient / guardian was educated about steps to take if suicide or homicide risk level increases between visits: yes While future psychiatric events cannot be accurately predicted, the patient does not currently require acute inpatient psychiatric care and does not currently meet [facility] involuntary commitment criteria.  Substance Abuse History: Current substance abuse: No     Past Psychiatric History:   Previous psychological history is significant for anxiety, personality disorder and paraphilia Outpatient Providers: Not assessed History of Psych Hospitalization: Yes  Psychological Testing: Not assessed   Abuse History: Victim of No.,    Report needed: No. Victim of Neglect:No. Perpetrator of No  Witness / Exposure to Domestic Violence: No   Protective Services Involvement: None, but may require APS involvement for himself later Witness to Community Violence:  No   Family History:  As above.  Patient lives alone.  .  Sexual Orientation:   Straight  Relationship Status:  separated 8 yrs Name of spouse / other:David [name]             If a parent, number of children / ages: 2, grown  Support Systems; spouse Supports include David [name], with complications, and son David [name], who is reaching out more.  Recent effort to involve social worker from [facility].  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  Pleased with the clergy and the people there,   Any cultural differences that may affect / interfere with treatment:  spiritual concerns / distress and specific religious practices (esp. re. 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 Anxiety   . BPH (benign prostatic hyperplasia)   . Dementia ([place])   . Depression   . Hyperlipidemia   . Hypertension   . Kidney stone   . Memory loss   . Sleep apnea    CPAP  . Vitamin D deficiency      Past Surgical History:  Procedure Laterality Date  . David EXTRACTION, BILATERAL  2018  . EXTERNAL EAR SURGERY     child  . EYE SURGERY Bilateral    David  . right knee meniscus  06/12/2008  . right talus repair     dislocated    Medications (as listed in Epic): Current Outpatient Medications  Medication Sig Dispense Refill  . aspirin EC 81 MG tablet Take 81 mg by mouth daily.    . Cholecalciferol (VITAMIN D3 PO) Take 2,000 Units by mouth daily.    Marland Kitchen escitalopram (LEXAPRO) 20 MG tablet Take 1/2 tab po qd x 1 week, then increase to 1 tab po qd 30 tablet 1  . LORazepam (ATIVAN) 1 MG tablet Take 1 tablet (1 mg total) by mouth 2 (two) times daily as needed for anxiety. 60 tablet 2  . memantine (NAMENDA) 10 MG tablet Take 1 tablet (10 mg total) by mouth 2 (two) times daily. 60 tablet 12  . PARoxetine (PAXIL) 40 MG tablet Decrease to 20 mg po qd x 1 week, then discontinue 7 tablet 0  . simvastatin (ZOCOR) 40 MG tablet Take 40 mg by mouth every evening.    . Tamsulosin HCl (FLOMAX) 0.4 MG CAPS Take 1 mg by mouth daily.     No current facility-administered medications for this visit.     Allergies  Allergen Reactions  . Abilify [Aripiprazole] Other (See Comments)    Short term memory loss  . Lamictal [Lamotrigine]     Increased agitation  . Wellbutrin [Bupropion]  Anxiety    Patient does not recall allergy type Anxiety, confusion per Dr Edwin Dada note    Diagnoses:    ICD-10-CM   1. Mood disorder ([place])  F39   2. Generalized anxiety disorder  F41.1   3. Insomnia due to mental condition  F51.05   4. Mild cognitive impairment  G31.84   5. 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 Wife remain responsible party wherever possible . Maintain medication as prescribed and work faithfully with relevant prescriber(s) if any changes are desired or seem indicated . Call the clinic on-call service, present to ER, or call 911 if any life-threatening psychiatric crisis Return in about 1 week (around 09/07/2018) for as available.  Blanchie Serve, PhD

## 2018-09-02 ENCOUNTER — Telehealth: Payer: Self-pay | Admitting: Psychiatry

## 2018-09-02 DIAGNOSIS — F411 Generalized anxiety disorder: Secondary | ICD-10-CM

## 2018-09-02 DIAGNOSIS — F5105 Insomnia due to other mental disorder: Secondary | ICD-10-CM

## 2018-09-02 MED ORDER — ASENAPINE MALEATE 2.5 MG SL SUBL: 2.5000 mg | SUBLINGUAL_TABLET | Freq: Every day | SUBLINGUAL | Status: DC

## 2018-09-02 NOTE — Telephone Encounter (Signed)
Received call from Rudene Anda who reports that patient's mother died yesterday and that there will be a Radiation protection practitioner.  She reports that patient is having a difficult time in response to his mother's death.  She reports that he has had increased anxiety and panic attacks.  She reports that at times he is yelling at her.  She reports that he has not been sleeping and has been drinking "quite a bit" since being advised not to drink excessively a few weeks ago.  She reports that she has offered to have patient come and stay in her home during this time and he has declined.  She reports that she would like to assist patient but is on certain how to help him at this time and ask for any assistance.  Discussed avoiding increasing benzodiazepines, particularly since he has been using alcohol, and that benzodiazepines could also increase confusion.  Discussed that medications such as hydroxyzine could also worsen confusion due to anticholinergic side effects.  Discussed considering low-dose atypical antipsychotic such as Seroquel or Saphris to improve sleep and acute anxiety.  [name] reports that she has Saphris 2.5 mg tablets available that a family member was prescribed and is no longer taking and that she would be able to provide these to the patient.  Contacted patient who reports that he is "distraught" and having severe anxiety.  He reports that he has been experiencing feelings of guilt that he has let his mother down.  He reports that he is "constantly haunted by the thought that there is something I need to be doing" despite being reassured by others that there is nothing that he needs to attend to.  He reports that his sleep has been poor and he has been unable to sleep for more than 2 hours consecutively.  He reports chronic, vague suicidal thoughts without intent.  He continues to contract for safety and reports that he would contact office or his wife if he were to experience suicidal  intent or have acute safety concerns.  Discussed starting Saphris with patient to be taken over the next few nights to improve insomnia and anxiety.  Discussed that adequate sleep would likely help with his cognition and anxiety.  Patient agrees to starting Saphris 2.5 mg SL nightly.  Contacted Rudene Anda to notify her that patient was reached and agrees with plan.  Patient has visit scheduled with [name], PhD for Tuesday, 09/04/18 and discussed that Dr. Rica Mote would be made aware of recent events.  Encouraged patient and Rudene Anda to contact office with any other questions or acute concerns.

## 2018-09-04 ENCOUNTER — Other Ambulatory Visit: Payer: Self-pay

## 2018-09-04 ENCOUNTER — Telehealth: Payer: Self-pay | Admitting: Psychiatry

## 2018-09-04 ENCOUNTER — Ambulatory Visit (INDEPENDENT_AMBULATORY_CARE_PROVIDER_SITE_OTHER): Payer: Medicare Other | Admitting: Psychiatry

## 2018-09-04 DIAGNOSIS — F5105 Insomnia due to other mental disorder: Secondary | ICD-10-CM

## 2018-09-04 DIAGNOSIS — F411 Generalized anxiety disorder: Secondary | ICD-10-CM | POA: Diagnosis not present

## 2018-09-04 DIAGNOSIS — F39 Unspecified mood [affective] disorder: Secondary | ICD-10-CM | POA: Diagnosis not present

## 2018-09-04 DIAGNOSIS — G3184 Mild cognitive impairment, so stated: Secondary | ICD-10-CM

## 2018-09-04 DIAGNOSIS — F659 Paraphilia, unspecified: Secondary | ICD-10-CM | POA: Diagnosis not present

## 2018-09-04 NOTE — Telephone Encounter (Signed)
Not needed
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
## 2018-09-06 ENCOUNTER — Telehealth: Payer: Self-pay | Admitting: Psychiatry

## 2018-09-06 NOTE — Telephone Encounter (Signed)
Followup call with wife/POA [name].  PT made his way home without incident, obviously did not find a IT consultant for his mother at the [facility].  Saddened, fairly despondent, but not in any way indicating drastic action or intentions.  Brief discussion with [name] brought up community advice that the [place] unit would be the best option for inpt care -- agreed and framed it as "the best fire extinguisher" should we have a "fire" we need to put out, still would need clearer criteria to admit.  Meanwhile, we should continue to work physiological and behavioral tactics we have to reduce distress, improve awareness and judgment, accept losses and boundaries, etc.  Possibility that B12 or other nutritional approach to early dementia would be in order, will advise Ms. Eulas Post of developments and hypotheses for psychiatric care.  Meanwhile, PT scheduled for Monday 10am, supposedly in-person, but indications from [place] are that he does not have this successfully or usefully recorded in his calendar and may be unreliable on his own to keep up with appointment, but she will ensure he gets a reminder.  Otherwise, some tension over seeing her and daughter, played out through offering to bring them a meal tonight and [name] declining, to his chagrin.  Attempted from the sound of it to guilt her about it, but she calmly stood ground.  Agreed he is capable of dealing with a day's disappointment, knowing their weekly shabbat dinner comes tomorrow.  Blanchie Serve, PhD [place] Licensed Psychologist
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
## 2018-09-10 ENCOUNTER — Telehealth: Payer: Self-pay | Admitting: Psychiatry

## 2018-09-10 ENCOUNTER — Ambulatory Visit: Payer: Medicare Other | Admitting: Psychiatry

## 2018-09-10 ENCOUNTER — Other Ambulatory Visit: Payer: Self-pay

## 2018-09-10 DIAGNOSIS — F411 Generalized anxiety disorder: Secondary | ICD-10-CM

## 2018-09-10 DIAGNOSIS — F330 Major depressive disorder, recurrent, mild: Secondary | ICD-10-CM

## 2018-09-10 DIAGNOSIS — G3184 Mild cognitive impairment, so stated: Secondary | ICD-10-CM

## 2018-09-10 NOTE — Telephone Encounter (Signed)
[name] called and said that wantsa you to consider suda dementia and stopping sima [place]
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
## 2018-09-10 NOTE — Telephone Encounter (Signed)
Also received message from wife who reports that pt is having increased anxiety, confusion, hallucinations, and grieving.  Contact pt's wife who questions if pt may have pseudo-dementia. She reports that she also has read about simvastatin causing mental status changes. PCP ordered Vit B12 and Vit D during visit this Saturday. Wife reports that pt would likely benefit from hearing testing as hearing impairment seems likely. Wife also questions if admission to [facility] would be appropriate. Discussed that case was staffed with [name], PhD, who saw the pt this morning and at this time pt is not meeting criteria for hospital admission. Discussed that pt would be scheduled sooner with this provider if possible. Discussed that this provider would staff case with Dr. Clovis Pu and coordinate care with PCP if needed.

## 2018-09-10 NOTE — Telephone Encounter (Signed)
He is very sad appropriately sad for everything wants you to look at the whole picture.

## 2018-09-11 ENCOUNTER — Telehealth: Payer: Self-pay | Admitting: *Deleted

## 2018-09-11 ENCOUNTER — Encounter: Payer: Self-pay | Admitting: *Deleted

## 2018-09-11 NOTE — Telephone Encounter (Signed)
Spoke with patient who stated his appt needs to be rescheduled. [name] is not available, and he prefers to come into office. We rescheduled for July; he wrote down date/time and our address, verbalized understanding, appreciation.

## 2018-09-11 NOTE — Telephone Encounter (Signed)
Spoke with patient and advised him that due to current COVID 19 pandemic, our office is severely reducing in person visits in order to minimize the risk to our patients and healthcare providers. We recommend to convert your appointment to a video visit.  He stated he has already done a few video visits, but he will have to see if [name], wife can assist him. He will call back today to advise. He verbalized understanding, appreciation.

## 2018-09-12 ENCOUNTER — Encounter: Payer: Self-pay | Admitting: Diagnostic Neuroimaging

## 2018-09-12 ENCOUNTER — Ambulatory Visit (INDEPENDENT_AMBULATORY_CARE_PROVIDER_SITE_OTHER): Payer: Medicare Other | Admitting: Diagnostic Neuroimaging

## 2018-09-12 ENCOUNTER — Institutional Professional Consult (permissible substitution): Payer: Self-pay | Admitting: Diagnostic Neuroimaging

## 2018-09-12 ENCOUNTER — Encounter: Payer: Self-pay | Admitting: *Deleted

## 2018-09-12 ENCOUNTER — Telehealth: Payer: Self-pay | Admitting: *Deleted

## 2018-09-12 ENCOUNTER — Other Ambulatory Visit: Payer: Self-pay

## 2018-09-12 DIAGNOSIS — F0390 Unspecified dementia without behavioral disturbance: Secondary | ICD-10-CM | POA: Diagnosis not present

## 2018-09-12 DIAGNOSIS — F332 Major depressive disorder, recurrent severe without psychotic features: Secondary | ICD-10-CM | POA: Diagnosis not present

## 2018-09-12 DIAGNOSIS — F419 Anxiety disorder, unspecified: Secondary | ICD-10-CM

## 2018-09-12 DIAGNOSIS — F03A0 Unspecified dementia, mild, without behavioral disturbance, psychotic disturbance, mood disturbance, and anxiety: Secondary | ICD-10-CM

## 2018-09-12 NOTE — Telephone Encounter (Signed)
Pt called stating he was on the way to the office, pt was told the appointment was cancelled.  Wife then came on and said that should have never been cancelled. Wife stated they were on their way to the office and that pt would be seen today or tomorrow and that pt can not wait until July to be seen.  Phone rep informed pt wife she would reach out to RN. RN was available to speak with pt and wife

## 2018-09-12 NOTE — Telephone Encounter (Signed)
Received call back from [place] asking why appt was canceled. She was with patient and had phone on speaker. I advised her of the two conversations I had with patient yesterday. She stated that they wanted FU asap. I told her Dr Leta Baptist has openings this morning if patient agrees to video visit. I advised that due to current COVID 19 pandemic, our office is severely reducing in person visits in order to minimize the risk to our patients and healthcare providers. We recommend to convert his appointment to a video visit. We'll take all precautions to reduce any security or privacy concerns. This will be treated like an office visit, and we will file with his insurance. He consented to video visit. Email to use: [email or web address].  Updated his EMR. Scheduled FU for 11 today. E mail sent. [name] confirmed receipt of e mail. They  verbalized understanding, appreciation.

## 2018-09-12 NOTE — Progress Notes (Signed)
    Virtual Visit via Video Note  I connected with David [name] on 09/12/18 at 11:00 AM EDT by a video enabled telemedicine application and verified that I am speaking with the correct person using two identifiers.   I discussed the limitations of evaluation and management by telemedicine and the availability of in person appointments. The patient expressed understanding and agreed to proceed.  Patient is at their home. I am at the office.    History of Present Illness:  UPDATE (09/12/18, VRP): Since last visit, doing poorly. More depression, anxiety, cognitive decline. Patient's mother passed away recently (from [place]) and this caused great depression and anxiety. Ex-wife is legal and health care POA. Patient still living alone. They are trying to figure out plans for the future. Now seeing dr. Clovis Pu and team for moo] disorder.     Observations/Objective:  - awake, alert - no dysarthria - face symm   Assessment and Plan:  Dx:  1. Mild dementia ([place])   2. Severe episode of recurrent major depressive disorder, without psychotic features ([place])   3. Anxiety      MEMORY LOSS (due to mild dementia and major depression / anxiety) - continue memantine 10mg  at bedtime; increase to twice a day after 1-2 weeks - safety / supervision issues reviewed - caregiver resources provided - no driving; should not live alone  ANXIETY/DEPRESSION - follow up with psychiatry; continue meds and counseling  SLEEP APNEA - encouraged CPAP usage; patient declines at this time    Follow Up Instructions:  - Return for pending if symptoms worsen or fail to improve.    I discussed the assessment and treatment plan with the patient. The patient was provided an opportunity to ask questions and all were answered. The patient agreed with the plan and demonstrated an understanding of the instructions.   The patient was advised to call back or seek an in-person evaluation if the symptoms  worsen or if the condition fails to improve as anticipated.  I provided 25 minutes of non-face-to-face time during this encounter.   Penni Bombard, MD 04/16/1094, 04:54 AM Certified in Neurology, Neurophysiology and Neuroimaging  [facility] [address], [place], [place] [address] [phone]

## 2018-09-12 NOTE — Telephone Encounter (Signed)
See phone note dated today. °

## 2018-09-13 ENCOUNTER — Telehealth: Payer: Self-pay | Admitting: Psychiatry

## 2018-09-13 ENCOUNTER — Other Ambulatory Visit: Payer: Self-pay | Admitting: Psychiatry

## 2018-09-13 ENCOUNTER — Ambulatory Visit (INDEPENDENT_AMBULATORY_CARE_PROVIDER_SITE_OTHER): Payer: Medicare Other | Admitting: Psychiatry

## 2018-09-13 DIAGNOSIS — F411 Generalized anxiety disorder: Secondary | ICD-10-CM | POA: Diagnosis not present

## 2018-09-13 DIAGNOSIS — F330 Major depressive disorder, recurrent, mild: Secondary | ICD-10-CM | POA: Diagnosis not present

## 2018-09-13 DIAGNOSIS — G3184 Mild cognitive impairment, so stated: Secondary | ICD-10-CM | POA: Diagnosis not present

## 2018-09-13 DIAGNOSIS — Z634 Disappearance and death of family member: Secondary | ICD-10-CM | POA: Diagnosis not present

## 2018-09-13 NOTE — Progress Notes (Addendum)
Crisis service note [name], PhD, [facility]  Patient ID: David [name]     MRN: [id number]     Date: 09/13/2018 Time spent: c. 1 hr  Telehealth note: Crisis encounter conducted entirely by telephone, on prior understandings that pandemic conditions encourage social distancing for safety, living situations of PT and estranged wife prevent coming for an in-person assessment, and insurance and regulatory agencies have approved the use of telehealth to meet such needs, and on the prior consent of PT and his estranged wife/POA to conduct treatment through telehealth when necessary, with all the provisions and informed consents involved.  Crisis call this afternoon from wife David [name], who says she needs PT "set up to go" to the [facility] hospital geriatric unit, since she "can't handle it any more."  He is confused, found out he took both morning and evening medications Thursday (today) morning, apparently a mental error, he can't stay with her (not clear there is an appeal to do so, though he has lobbied in recent memory to reunite), and on top of it he may be sued by his downstairs neighbor for a sewage leak he allegedly failed to address (something PT told me 2 visits ago he had a plumber coming that day for, in progress, and the plumber either was going to or had already spoken to the neighbor to vouch for the fact that repairs were under way).  and she is alarmed that he will disintegrate and require emergency hospitalization precipitously.  David [name] was unable to process at the moment, as she is on a tech support call for her phone.  Agreed to return call 30 min or so.  Called PT, who says he is confused about where he is supposed to be, what he is supposed to do, just deciding to hunker down and weather it.  Denies suicidal or homicidal feelings or impulses, hallucinations, delusions, medication reactions, inability to perform ADLs, or other material threats that would constitute  grounds for commitment or acute hospitalizaiton.  Does say the date 6/4 is salient for him with grief, as it is the date of his father's birth and his death, both.  (Later found to be inaccurate.)  PT checked his premade med pack, verifies he has taken both this morning's and this evening's medications already, with the possible exception of Saphris. which is the newest and packaged separately.  (Also found out later to be inaccurate.)  Looked about, cannot find his Saphris supply at this time.  Advised based on an educated lay understanding of his meds that he should simply sleep off the excess tonight and resume normal dosing in the morning, unless he finds Saphris, which he recognizes as a sublingual medication, in which case it is OK to take one tonight before bed.  (Later discovered that he does not have a Saphris supply because it has not been prescribed to him, but to his daughter.)    Orientation generally good, recognizes person, place, role, and occasion, though he thought yesterday was Thursday, 6/3.  Corrected.  Agreed that grief anniversaries throw him into some chaos, both emotionally and mentally.  Barely recalls speaking with his neurologist yesterday, by video.  Verified calendar with him for [facility] services, only needing to correct an outdated 6/16 notation in his calendar for Ms. Eulas Post and directing him to throw away the 6/16 appointment card, as he now has a 6/8 visit.  PT on his own verified today's date 4 times, stating correctly but perseveratively w/o prompting.  RTC to [place].  Father's death date was 2022/07/05, birthday actually [birth year], and yes, holidays and anniversaries are always poignant, including the date his first wife left him.  Neurological findings in EHR note parietal and temporal changes consistent with a dementing illness, which seems in evidence.  [name] primary concern now is that PT's previous breakdown and admission, and all the trauma involved for both of them,  began with him making medication errors and unraveling self-care.  Pharmacy-issued blister packs were supposed to be a remedy for that, and sound like they have been, until today.  Informed of plan to bypass night medication today and resume normal dosing tomorrow.  Should be sufficient for outpatient management in the short run, and it will be a good test of compliance and organization for him to try this for himself until seen back in the office Monday (Ms. Eulas Post) and Tuesday (myself).  Tentatively agrees, though not without a couple of overwrought comments, and then apologies, from her.  Assured no one means to ask her to go beyond her strength, just stay clear what does and does not meet hospital criteria, and know that those who would facilitate hospitalization if needed are now alert to [facility] as the best option, albeit without any of [place] prefiguring that he should have to come off all medication and rebuild from scratch.  Far better if we put our efforts into building up his outpatient support system, which will mean in part that PT needs to grant [place] access to his apartment to see and verify conditions, and that it may very well include establishing DSS contact and/or a community support team, possibly to include visiting nurses' aides, med techs, or hab techs to observe, guide, and ensure important self-care features.  David [name] notes also that she was in touch with [facility] president today, and the plumber was called by the [facility], not by PT, who seems to have longstanding tendencies both to duck-and-cover and to gloss over accounts of his response to social failings.  Acknowledged, keeping the focus on practicality over fault-finding.  a/o 09/14/18, note sent to neurologist Jenny Reichmann, MD) and PCP Edwin Dada, MD), with reply from neuro, in whose opinion PT is no longer reliable to live on his own and should initiate some form of supported living arrangement.  Blanchie Serve, PhD [name], PhD  LP Clinical Psychologist, [facility] [address] [place], [place] [address]

## 2018-09-15 NOTE — Telephone Encounter (Signed)
Extended telephone service for crisis evaluation.  See separate encounter, CPT [id number].  [name], PhD LP

## 2018-09-17 ENCOUNTER — Other Ambulatory Visit: Payer: Self-pay

## 2018-09-17 ENCOUNTER — Encounter: Payer: Self-pay | Admitting: Psychiatry

## 2018-09-17 ENCOUNTER — Ambulatory Visit: Payer: Medicare Other | Admitting: Psychiatry

## 2018-09-17 DIAGNOSIS — F411 Generalized anxiety disorder: Secondary | ICD-10-CM | POA: Diagnosis not present

## 2018-09-17 DIAGNOSIS — F330 Major depressive disorder, recurrent, mild: Secondary | ICD-10-CM | POA: Diagnosis not present

## 2018-09-17 MED ORDER — LORAZEPAM 1 MG PO TABS: 1.0000 mg | ORAL_TABLET | Freq: Two times a day (BID) | ORAL | 2 refills | Status: DC | PRN

## 2018-09-17 MED ORDER — PAROXETINE HCL 40 MG PO TABS: ORAL_TABLET | ORAL | 0 refills | Status: DC

## 2018-09-17 MED ORDER — ESCITALOPRAM OXALATE 20 MG PO TABS: ORAL_TABLET | ORAL | 1 refills | Status: DC

## 2018-09-17 NOTE — Progress Notes (Signed)
David [name] [id number] 04-30-1945 73 y.o.  Subjective:   Patient ID:  David [name] is a 73 y.o. (DOB 1945/07/25) male.  Chief Complaint:  Chief Complaint  Patient presents with  . Anxiety  . Depression    HPI David [name] presents to the office today for follow-up of depression and anxiety. He is accompanied by [employer]. He reports that he is "very distressed, very confused... frightened that there is something I am losing track of something." He reports significant anxiety and feeling as if he is making mistakes and trying to determine what he may have missed. They report that he is having panic attacks multiple times a day. Reports frequent worry, obsessive thoughts, and rumination. "Everything is a blur." He reports being "paralyzed" at times by anxiety due to fear of making a mistake. He reports sad mood. [name] reports that he has been having crying episodes. He reports that he continues to grieve the loss of his mother and experiencing some guilt with this and fears that he may have disappointed her.   He reports poor sleep and is unsure if he has more difficulty falling or staying asleep and reports that he feels sleep deprived. Reports that he does not recall difficulty middle of the night awakenings. Reports that sleep is fragmented. [name] reports that pt naps periodically during the day. He is unsure if he is having nightmares. He reports that he typically eats at least one meal a day. [name] reports that he tends to eat large quantities when he eats at her house. [name] reports that he eats most of his meals from a fast food restaurant. [name] reports that his food choices have been different from past food choices. He reports that his energy is adequate and that he takes brisk walks several times a week that are several miles. He reports that his motivation is "sporadic" and he is trying to make sure he showers and shaves daily. He reports that he has suicidal thoughts  and denies suicidal intent. He contracts for safety.   He reports that he has confusion regarding managing personal finances. He reports that he had to ask [name] to help manage his finances about a month ago. She reports that several accounts were past due. They report in the past he would manage the finances for the family. He is unsure how often he is doing laundry or where he may be currently doing his laundry. [name] reports that he has not been to her house recently to do laundry.   [name] reports that pt has been "double dosing" medications some days due to not remembering if he took his medication. He reports that he occasionally has difficulty keeping track of the day. Reports that he has tabs remaining from last Thursday that he says he must have forgotten to take.  [name] reports that she thinks that Paxil is not adequately controlling his mood or anxiety. She reports that mood and anxiety have worsened on Paxil. She reports that she has noticed mood lability and agitation. She reports Lorazepam does not seem to be as effective as Lorazepam. [name] reports that he has had delusions, such as thinking that a headstone had been purchased for his mother at a temple in [place]. She reports that he remains convinced that there may be a headstone here for her. She reports that he has been fixating on a lost toiletry kit.   [name] reports that pt allowed her into his home yesterday for the first time  in years.     Past Psychiatric Medication Trials: Rexulti- helpful for mood. Possible akathisia Sertraline- has been helpful for mood and anxiety. Taken since 2006. Has taken up to 300 mg po qd Effexor Paxil Cymbalta- was effective and then stopped Prozac Abilify-"more confused" and possible paradoxical reaction. Rexulti- Possible akathisia at higher dose Saphris- Minimal effect at 2.5 mg.  Buspar Propranolol- Not effective Lamictal- increased agitation Wellbutrin- Adverse effects, increased  agitation Alprazolam- Effective Ativan- Ineffective Valium- Calming effect Ambien- Parasomnia Remeron- May have caused worsening confusion.      Review of Systems:  Review of Systems  Musculoskeletal: Negative for gait problem.       Knee pain  Neurological: Negative for tremors.  Psychiatric/Behavioral:       Please refer to HPI    Medications: I have reviewed the patient's current medications.  Current Outpatient Medications  Medication Sig Dispense Refill  . aspirin EC 81 MG tablet Take 81 mg by mouth daily.    . Cholecalciferol (VITAMIN D3 PO) Take 2,000 Units by mouth daily.    Derrill Memo ON 09/25/2018] LORazepam (ATIVAN) 1 MG tablet Take 1 tablet (1 mg total) by mouth 2 (two) times daily as needed for anxiety. 60 tablet 2  . memantine (NAMENDA) 10 MG tablet Take 1 tablet (10 mg total) by mouth 2 (two) times daily. 60 tablet 12  . PARoxetine (PAXIL) 40 MG tablet Decrease to 20 mg po qd x 1 week, then discontinue 7 tablet 0  . simvastatin (ZOCOR) 40 MG tablet Take 40 mg by mouth every evening.    . Tamsulosin HCl (FLOMAX) 0.4 MG CAPS Take 1 mg by mouth daily.    Marland Kitchen escitalopram (LEXAPRO) 20 MG tablet Take 1/2 tab po qd x 1 week, then increase to 1 tab po qd 30 tablet 1   No current facility-administered medications for this visit.     Medication Side Effects: Other: [name] questions if Paxil may be causing some mental status changes.   Allergies:  Allergies  Allergen Reactions  . Abilify [Aripiprazole] Other (See Comments)    Short term memory loss  . Lamictal [Lamotrigine]     Increased agitation  . Wellbutrin [Bupropion] Anxiety    Patient does not recall allergy type Anxiety, confusion per Dr Edwin Dada note    Past Medical History:  Diagnosis Date  . Anxiety   . BPH (benign prostatic hyperplasia)   . Dementia ([place])   . Depression   . Hyperlipidemia   . Hypertension   . Kidney stone   . Memory loss   . Sleep apnea    CPAP  . Vitamin D deficiency      Family History  Problem Relation Age of Onset  . Alzheimer's disease Mother   . Depression Mother   . Cancer Father        prostate  . Thyroid disease Father   . Alzheimer's disease Maternal Grandmother   . Heart disease Maternal Grandfather   . Stroke Paternal Grandmother   . Stroke Paternal Grandfather   . Alcoholism Brother   . Autism Daughter   . Autism Maternal Uncle     Social History   Socioeconomic History  . Marital status: Legally Separated    Spouse name: [name]  . Number of children: 2  . Years of education: 86  . 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 Financial resource strain: Not on file  .  Food insecurity:    Worry: Not on file    Inability: Not on file  . Transportation needs:    Medical: Not on file    Non-medical: Not on file  Tobacco Use  . Smoking status: Never Smoker  . Smokeless tobacco: Never Used  Substance and Sexual Activity  . Alcohol use: Yes    Alcohol/week: 1.0 - 2.0 standard drinks    Types: 1 - 2 Shots of liquor per week    Comment: 1-2 shots 4-5 x week  . Drug use: Yes    Types: Marijuana    Comment: Occasional  . Sexual activity: Never  Lifestyle  . Physical activity:    Days per week: Not on file    Minutes per session: Not on file  . Stress: Not on file  Relationships  . Social connections:    Talks on phone: Not on file    Gets together: Not on file    Attends religious service: Not on file    Active member of club or organization: Not on file    Attends meetings of clubs or organizations: Not on file    Relationship status: Not on file  . Intimate partner violence:    Fear of current or ex partner: Not on file    Emotionally abused: Not on file    Physically abused: Not on file    Forced sexual activity: Not on file  Other Topics Concern  . Not on file  Social History Narrative   Lives alone retired.  Education: BS, MS Database administrator.  Is  separated.  Children son, daughter, 2 gks, mother who is 24yr  Cokes 0-3 daily.    Past Medical History, Surgical history, Social history, and Family history were reviewed and updated as appropriate.   Please see review of systems for further details on the patient's review from today.   Objective:   Physical Exam:  There were no vitals taken for this visit.  Physical Exam Musculoskeletal:        General: No deformity.  Neurological:     Mental Status: He is alert.  Psychiatric:        Mood and Affect: Mood is anxious and depressed.        Speech: Speech normal.        Behavior: Behavior is cooperative.        Thought Content: Thought content is delusional.        Cognition and Memory: Cognition is impaired. He exhibits impaired recent memory.        Judgment: Judgment is inappropriate.     Comments: Insight intact Reports suicidal thoughts without intent. Contracts for safety.  Pt mildly agitated for brief periods of time when expressing that he is having difficulty comprehending discussion and plan.      Lab Review:     Component Value Date/Time   NA 139 06/23/2018 1834   K 4.2 06/23/2018 1834   CL 106 06/23/2018 1834   CO2 24 06/23/2018 1834   GLUCOSE 89 06/23/2018 1834   BUN 17 06/23/2018 1834   CREATININE 1.32 (H) 06/23/2018 1834   CALCIUM 9.2 06/23/2018 1834   PROT 6.4 (L) 06/23/2018 1834   ALBUMIN 4.1 06/23/2018 1834   AST 17 06/23/2018 1834   ALT 14 06/23/2018 1834   ALKPHOS 46 06/23/2018 1834   BILITOT 0.9 06/23/2018 1834   GFRNONAA 54 (L) 06/23/2018 1834   GFRAA >60 06/23/2018 1834       Component Value Date/Time  WBC 11.3 (H) 06/23/2018 1834   RBC 4.42 06/23/2018 1834   HGB 13.6 06/23/2018 1834   HCT 42.3 06/23/2018 1834   PLT 258 06/23/2018 1834   MCV 95.7 06/23/2018 1834   MCH 30.8 06/23/2018 1834   MCHC 32.2 06/23/2018 1834   RDW 12.4 06/23/2018 1834    No results found for: POCLITH, LITHIUM   No results found for: PHENYTOIN,  PHENOBARB, VALPROATE, CBMZ   .res Assessment: Plan:   Patient seen for 60 minutes and greater than 50% of visit spent counseling patient and caregiver, and with coordination of care to include reviewing documentation from neurologist and psychologist.  Agree with neurologist's recommendation that patient should not be living alone.  Recommended considering alternate arrangements.  Case was also staffed with Dr. Clovis Pu.  Will decrease and discontinue Paxil due to no significant improvement in mood and anxiety s/s, and some possible worsening in these s/s and mental status. Discussed potential benefits, risks, and side effects of restarting sertraline or initiating Lexapro.  Patient and Rudene Anda agree to trial of Lexapro. Discussed avoiding making multiple medication changes since this may result in increased confusion and difficulty determining response to med change.  Sent notes to pharmacy regarding cross titration from Paxil to Lexapro.  Recommended that patient complete current dose pack with Paxil 40 mg daily since he has only 1-1/2 days remaining until receiving new dose pack. Will decrease Paxil to 20 mg daily for 1 week, then discontinue. Start Lexapro 20 mg 1/2 tablet daily for 1 week, then increase to 1 tablet daily for anxiety and depression. Continue Ativan 1 mg twice daily as needed for anxiety. Recommend continuing psychotherapy with [name], PhD. Patient to follow-up in 2 weeks or sooner if clinically indicated due to high acuity. Patient advised to contact office with any questions, adverse effects, or acute worsening in signs and symptoms.    MDD (major depressive disorder), recurrent episode, mild (HCC) - Plan: PARoxetine (PAXIL) 40 MG tablet, escitalopram (LEXAPRO) 20 MG tablet  Generalized anxiety disorder - Plan: PARoxetine (PAXIL) 40 MG tablet, escitalopram (LEXAPRO) 20 MG tablet, LORazepam (ATIVAN) 1 MG tablet  Please see After Visit Summary for patient specific  instructions.  Future Appointments  Date Time Provider [place]  09/18/2018  3:00 PM Blanchie Serve, PhD CP-CP None  09/24/2018  1:00 PM Blanchie Serve, PhD [facility] None  10/01/2018  2:00 PM [name], PMHNP CP-CP None  10/03/2018  1:00 PM Mitchum, Herbie Baltimore, PhD CP[facility] None    No orders of the defined types were placed in this encounter.     -------------------------------

## 2018-09-18 ENCOUNTER — Other Ambulatory Visit: Payer: Self-pay

## 2018-09-18 ENCOUNTER — Ambulatory Visit (INDEPENDENT_AMBULATORY_CARE_PROVIDER_SITE_OTHER): Payer: Medicare Other | Admitting: Psychiatry

## 2018-09-18 DIAGNOSIS — F330 Major depressive disorder, recurrent, mild: Secondary | ICD-10-CM

## 2018-09-18 DIAGNOSIS — G3184 Mild cognitive impairment, so stated: Secondary | ICD-10-CM | POA: Diagnosis not present

## 2018-09-18 DIAGNOSIS — R69 Illness, unspecified: Secondary | ICD-10-CM

## 2018-09-18 DIAGNOSIS — F659 Paraphilia, unspecified: Secondary | ICD-10-CM | POA: Diagnosis not present

## 2018-09-18 DIAGNOSIS — F411 Generalized anxiety disorder: Secondary | ICD-10-CM | POA: Diagnosis not present

## 2018-09-18 NOTE — Progress Notes (Signed)
Psychotherapy Progress Note [facility] [name], PhD LP  Patient ID: David [name]     MRN: [id number]     Therapy format: Family therapy w/ patient -- accompanied by wife, David [name] Date: 09/18/2018     Start: 3:15p Stop: 4:20p Time Spent: 65 min  Session narrative (presenting needs, interim history, self-report of stressors and symptoms, applications of prior therapy, status changes, and interventions made in session) C/o ongoing confusion, anxiety about confusion.  Looked over his calendar -- shaky handwriting but accurate info.  Outdated appt cards stuck in.  Led PT patiently through assessing and determining whether his notes needed to be kept, including a stray question about TD for Janett Billow that he thought may have had something to do with [name], whether TD is part of her syndrome, and whether he may be on the spectrum himself.  Assured he does not show signs of TD, and it is safe to dispose of his note.  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  Got teased, labelled as a child.    Returning to present needs, confusion over neurologist appointment and service.  PT recalls little of last week's video meeting with [name].  David [name] reports he believes ongoing care of his dementia should return to his PCP and behavioral health jointly but stay on Namenda BID as currently prescribed.    Does require David [name] to check his calendar and appointments for accuracy.  Pharmacy calls to notify when medication blister packs are ready.  [name] further concern that PT does not take meds necessarily at right time or date.  In session, PT perseveratively verifying when he takes next dose and what to do for tomorrow evening's dose.  Also perseverative about whether it is OK to take lorazepam today or tonight at bedtime.  Began to annoy wife, toward whom this may have been dependent, care-seeking  behavior meant to prolong the interaction, but it certainly appeared to be both obsessive, per his premorbid personality, and perseverative, per early dementia and/or autistic spectrum issues.    Confirmed that David [name] is handling the plumbing claim, master bathroom water is shut off, and she has communicated effectively with his HOA.  We concur that PT is not presently competent to manage his [place] responsibilities.  Secured PT's verbal blessing for David [name] to inspect in his apartment as needed to handle legal and financial business, including but not limited to the plumbing issue, and she showed phone picture of disarray in his apartment, apparently witnessed recently but typical.  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 MDD (major depressive  disorder), recurrent episode, mild ([place]) F33.0   2. Generalized anxiety disorder F41.1   3. Mild cognitive impairment G31.84   4. 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 Suspected condition R69    Possibly autistic spectrum   Assessment of progress:  guarded, seems to be in some decline around mother's death  Plan:  . Continue to allow PRN use of lorazepam -- does not seem to  be abusing in any way . Continue use of blister packs to organize dosing; make any written instructions as clear and unmistakable as possible . For perseveration, David [name] to use tactic of asking PT what he sees in writing, nudging him to trust what he heard if it seems to be perseverative . Enact PT's consent to have his apartment entered and inspected by David [name] . Come back to [employer] referral . Other recommendations/advice as noted above . Continue to utilize previously learned skills ad lib . Maintain medication as prescribed and work faithfully with relevant prescriber(s) if any changes are desired or seem indicated . Call the clinic on-call service, present to ER, or call 911 if any life-threatening psychiatric crisis Return in about 1 week (around 09/25/2018) for invite significant other.   Blanchie Serve, PhD [name], PhD LP Clinical Psychologist, [facility] [address] [place], [place] [address] [phone]

## 2018-09-24 ENCOUNTER — Encounter: Payer: Self-pay | Admitting: Psychiatry

## 2018-09-24 ENCOUNTER — Ambulatory Visit: Payer: Medicare Other | Admitting: Psychiatry

## 2018-09-24 NOTE — Progress Notes (Signed)
Admin note for No-show or Short Notice Cancellation  Patient ID: David [name]  MRN: [id number] DATE: 09/24/2018  Short notice cancellation today for 1pm appt, notified by estranged wife [name].  Their son visited and showed suspicious signs of COVID-19, sparking fear of exposure and contagion risk.  NS fee waived per policy.  Next scheduled appt 6/24.  Wife continues to act as main support, POA, HCPOA, an primary contact should urgent services be required.  Continue to suspect PT will need some for of home health, ACT team, or CST team to maintain independent living as dementia and any pseudodementia progress.  At this time, understood safe to continue with family support and monitoring, but will require some verification of medication use, further access to his living space to address clutter and assess potential health hazards.  Blanchie Serve, PhD

## 2018-09-25 ENCOUNTER — Ambulatory Visit: Payer: Medicare Other | Admitting: Psychiatry

## 2018-10-01 ENCOUNTER — Ambulatory Visit: Payer: Medicare Other | Admitting: Psychiatry

## 2018-10-02 ENCOUNTER — Telehealth: Payer: Self-pay | Admitting: Psychiatry

## 2018-10-02 NOTE — Telephone Encounter (Signed)
David [name] (spouse) of patient wants to get a second opinion bc patient is not getting better he's getting worse.  Wants to speak with you regarding patient's medication.  David [name] stated she left a msg for Janett Billow on yesterday

## 2018-10-03 ENCOUNTER — Ambulatory Visit: Payer: Medicare Other | Admitting: Psychiatry

## 2018-10-03 ENCOUNTER — Other Ambulatory Visit: Payer: Self-pay

## 2018-10-03 DIAGNOSIS — Z634 Disappearance and death of family member: Secondary | ICD-10-CM

## 2018-10-03 DIAGNOSIS — G3184 Mild cognitive impairment, so stated: Secondary | ICD-10-CM

## 2018-10-03 DIAGNOSIS — F411 Generalized anxiety disorder: Secondary | ICD-10-CM

## 2018-10-03 DIAGNOSIS — F330 Major depressive disorder, recurrent, mild: Secondary | ICD-10-CM | POA: Diagnosis not present

## 2018-10-03 NOTE — Progress Notes (Signed)
Psychotherapy Progress Note [facility] [name], PhD LP  Patient ID: David [name]     MRN: [id number]     Therapy format: Individual psychotherapy Date: 10/03/2018     Start: 1:15p Stop: 2:00p Time Spent: 45 min Location: in-person   Session narrative (presenting needs, interim history, self-report of stressors and symptoms, applications of prior therapy, status changes, and interventions made in session) Continues to experience daily, nagging anxiety that he may be missing things, which he probably is.  Brings a 2019 datebook, where he is making copious June 2020 notes, enough to crowd the space available. Supportively confronted as counterproductive to concentration, organization, and memory to do this and called for different kinds of reminders in ecologically appropriate places.    Agrees he needs a more supervised living arrangement and someone else to do the math about affording it.  Son David [name] has his hands full with family situations, could not take him on.  May be premature also.  Relies on David [name] to figure things but seems out of touch with his situation, including the leaking toilet.  Driving -- no incidents, no safety issues noted, but some times of difficulty navigating near son's home in [facility], for it being unfamiliar and for some of the route being routine for visiting mother's former nursing home as well.    Socially -- does feel more or less cut off.  Some contact with neighbors, but loaded.    Meds -- Reviewed med packs, only one day missed in last 2 weeks.  Large print labels help orient.  Had one med pack in possession that was not labelled, may be an old supply that should have been eliminated.  Interested in taking lorazepam for anxiety, but no indication he is overtaking it.  Resolved to meet David [name] just with David [name] sometime soon to prepare choices and strategies for supported living, be it in an assisted living center or alone as he has been,  just with regular caregivers and/or other in-home services.    At session's end, PT begins compulsively checking when it is he takes different medications.  Was able to count and track dates for his med pack, though it would help to have days labelled with numbers.  Option to retry an automatic pill dispenser, provided he will not dismantle it.  Therapeutic modalities: Cognitive Behavioral Therapy, Solution-Oriented/Positive Psychology and Ego-Supportive  Mental Status/Observations:  Appearance:   Casual     Behavior:  scattering  Motor:  Normal  Speech/Language:   Clear and Coherent  Affect:  Appropriate  Mood:  anxious  Thought process:  blocked and loose associations  Thought content:    Ilusions and Rumination  Sensory/Perceptual disturbances:    WNL  Orientation:  Generally oriented to person and situation, date fuzzy  Attention:  Fair  Concentration:  Fair  Memory:  impaired working memory  Insight:    Fair  Judgment:   Fair  Impulse Control:  Fair   Risk Assessment: Danger to Self:  not frankly, though capable of mixing up medication timing Self-injurious Behavior: No Danger to Others: No Duty to Warn:no Physical Aggression / Violence:No  Access to Firearms a concern: No   Diagnosis:   ICD-10-CM   1. MDD (major depressive disorder), recurrent episode, mild ([place])  F33.0   2. Mild cognitive impairment  G31.84    question of diagnosable dementia  3. Generalized anxiety disorder  F41.1   4. Bereavement  Z63.4    Assessment of progress:  guarded  Plan:  . Work through Occupational hygienist with [employer] and [employer] . Allow David [name] in to see living space and judge need . Meet separately with David [name] to work out options for supported living and identify resources to check . Other recommendations/advice as noted above . Continue to utilize previously learned skills ad lib . Maintain medication as prescribed and work faithfully with relevant prescriber(s) if any changes are desired or  seem indicated . Call the clinic on-call service, present to ER, or call 911 if any life-threatening psychiatric crisis Return in 1 week (on 10/10/2018) for as available.   Blanchie Serve, PhD [name], PhD LP Clinical Psychologist, [facility] [address], [place],  [address] [phone]

## 2018-10-04 ENCOUNTER — Telehealth: Payer: Self-pay | Admitting: Psychiatry

## 2018-10-04 ENCOUNTER — Telehealth (INDEPENDENT_AMBULATORY_CARE_PROVIDER_SITE_OTHER): Payer: Self-pay | Admitting: Psychiatry

## 2018-10-04 DIAGNOSIS — F330 Major depressive disorder, recurrent, mild: Secondary | ICD-10-CM

## 2018-10-04 DIAGNOSIS — G3184 Mild cognitive impairment, so stated: Secondary | ICD-10-CM

## 2018-10-04 NOTE — Telephone Encounter (Signed)
I can try to reach Speed about 5:20-5:30 if that will work.  Will need to get her phone number if you have it handy.  Basically, we need to figure out whether she can take on 3 tasks on Larry's behalf: (1) price out a sitter services for 2-5 visits a week, (2) check with insurance's case management or nurseline to see if they can provide a regular service (e.g., weekly) to verify meds are on schedule and make timely observations whether he is managing his living environment (they may very well want to save hospital costs by providing that), and (3) consult DSS about any guidance they have establishing a Conservation officer, nature and any other social work supports that would make sense.  Probably a 20-30 minute conversation to map this out and get past emotional interference getting through the plan, but if you think she could hear that synopsis first, it will save time and cost here.

## 2018-10-04 NOTE — Telephone Encounter (Signed)
Pt' s wife called to discuss [name].

## 2018-10-04 NOTE — Telephone Encounter (Signed)
Attempted to reach [employer]. No answer. Left message.

## 2018-10-04 NOTE — Telephone Encounter (Signed)
Admin note for coordination of care  Patient ID: David [name]  MRN: [id number] DATE: 10/04/2018  Spoke with estranged wife David [name] 20 min, delimited by birthday celebration for PT today.  She has decided to try to get him admitted to [facility] geriatric unit, despite sometimes impressionistic and dubious admission criteria, with hope of having his medication dismantled and reassembled to establish a healthier, more reliable frame of mind.  Clinically, concerned here that the personal upheaval of going inpatient again and taking on a new environment could be as likely to deteriorate as help, but as sole caregiver, with limited ability to manage his home and financial needs, and herself becoming overstressed with the responsibility, she clearly needs the respite.    Discussed likelihood of not meeting admission criteria and the importance of being as factual as possible about what she witnesses he can and cannot do and what the evident risks are remaining in his home at this time.    Discussed inevitable responsibility to either facilitate and provide for the cost of full-blown assisted living, for which he may not quite qualify, or work through a system of contacts and services that would create a reliable outpatient support team, to include, at a minimum, (1) personal care service a least 3 part-days a week, (2) an insurance-sponsored home nursing visit for at-least weekly reconciliation of medication reconciliations, and (3) consultation with DSS about establishing a CST or any other services appropriate to a single man with early dementia and very limited family support.  David [name] says she has begun looking into getting an administrative divorce to better each of them for services.  PT scheduled or next Thursday, barring admission.  Have informed David [name] that PT did agree yesterday to allow hired help into his condo, is interested in the idea of tangible 3rd-party assistance, and does accept -- at  least while in my company -- that he will not be re-entering the household with leslie and [name].  Suggested to her that his angst about separation and a hypothetical reunion may just be a lot more apt to come up just for seeing her be the only person inolved in his personal life -- take away the object more, take away the extra added distress that manifests as worse dementia and helplessness.  Will confer further later.  Possible that [place] will make the DSS fact-finding call, while David [name] investigates sitter services and med nurse coverage.  Blanchie Serve, PhD

## 2018-10-04 NOTE — Telephone Encounter (Signed)
Please call [name] Desmar[place] urgently today.

## 2018-10-08 ENCOUNTER — Other Ambulatory Visit: Payer: Self-pay | Admitting: Psychiatry

## 2018-10-08 DIAGNOSIS — F330 Major depressive disorder, recurrent, mild: Secondary | ICD-10-CM

## 2018-10-08 DIAGNOSIS — F411 Generalized anxiety disorder: Secondary | ICD-10-CM

## 2018-10-09 ENCOUNTER — Telehealth: Payer: Self-pay | Admitting: Psychiatry

## 2018-10-09 NOTE — Telephone Encounter (Signed)
Returned call to [employer] who had requested a return call prior to pt's scheduled visit tomorrow. She reports that David [name] continues to exhibit confusion and he frequently complains of disorientation. She reports that he showed up at her house unexpectedly and wanted to collect some of his belongings from her garage. She reports that she informed him that she had another obligation and they would have to arrange another time. She reports that he then said he had to leave immediately to meet the plumber at his house. She reports that she had scheduled the plumber another day and called the plumber to find out if he had called David [name]. The plumber said he had not called David [name]. David [name] then called her saying he was upset that the plumber was not there.   She reports that Dr. Rica Mote had reported that pt agreed to allow her to help him clean his home, however he has refused to allow her to come into his home on the several occasions that he has offered.   She reports that he continues to be very anxious. She reports that he has denied SI.  Will staff case with Dr. Clovis Pu after tomorrow's visit. Will inform [name] Garnett of any changes in treatment plan.

## 2018-10-09 NOTE — Telephone Encounter (Signed)
Has appt. tomorrow

## 2018-10-10 ENCOUNTER — Ambulatory Visit: Payer: Medicare Other | Admitting: Psychiatry

## 2018-10-10 ENCOUNTER — Other Ambulatory Visit: Payer: Self-pay

## 2018-10-10 ENCOUNTER — Encounter: Payer: Self-pay | Admitting: Psychiatry

## 2018-10-10 DIAGNOSIS — F411 Generalized anxiety disorder: Secondary | ICD-10-CM

## 2018-10-10 DIAGNOSIS — F32A Depression, unspecified: Secondary | ICD-10-CM

## 2018-10-10 DIAGNOSIS — F29 Unspecified psychosis not due to a substance or known physiological condition: Secondary | ICD-10-CM

## 2018-10-10 DIAGNOSIS — F329 Major depressive disorder, single episode, unspecified: Secondary | ICD-10-CM | POA: Diagnosis not present

## 2018-10-10 NOTE — Progress Notes (Signed)
Hence [name] [id number] 1945/09/12 73 y.o.  Subjective:   Patient ID:  David [name] is a 73 y.o. (DOB [birth year]) male.  Chief Complaint:  Chief Complaint  Patient presents with  . Anxiety  . Depression  . Other    Suicidal thoughts, delusions, increased confusion    HPI [name] presents to the office today for follow-up of anxiety, depression, and recent increased confusion. He is accompanied by [name] (spouse- legally separated). Pt and [name] report that he has been experiencing worsening anxiety, agitation, and irritability. [name] reports that pt appears to be responding to internal stimuli and experiencing delusions. Pt reports "I am believing things that haven't happened." He reports that he is trying to keep records in a notebook "to have some tangible evidence that what I am thinking is accurate."   Pt reports, "I'm agitated... I'm very confused." He reports some disorientation and frequent anxiety that he is not where he is supposed to be or is forgetting something important.  He c/o "tremendous anxiety" with being unable to locate 2 credit cards at time of exam and continues to search for credit cards. Reports sleep is "episodic, 3-4 hours at a time." He reports that he is uncertain if he is getting an adequate amount of sleep, then later states, "I'm not sleeping well." He reports that appetite and food intake is somewhat low and tries to eat one fast food meal a day. He reports that he has had some nervous energy. Reports that he has not been taking walks as often and [name] reports that he has told her that he is concerned about not being about to find his way home. [name] reports concerns that pt is having difficulties with self care and hygiene. Both pt and [name] report that pt continues to have significant difficulty managing medication despite pharmacy providing medications in pill packs. He reports that he has been keeping up with his laundry with  [name] assistance however [name] reports that it has been 3 weeks since she last helped him with laundry. Pt reports that he has needed [name] to manage his finances and personal business, such as scheduling home repairs for him. They report that [name] has offered to assist him with cleaning his home, however pt reports that he has not allowed her to come into his home and assist with cleaning for fear that he may be unable to locate certain items after she cleans.  Pt describes feeling hopeless and distraught.  He reports suicidal thoughts and has contemplated jumping off his second floor balcony.   He reports that he has tried to avoid taking Lorazepam as needed.   Pt has limited psychosocial supports. Has daughter with special needs. Pt's mother recently died from [place] 52.    Past Psychiatric Medication Trials: Rexulti- helpful for mood. Possible akathisia Sertraline- has been helpful for mood and anxiety. Taken since 2006. Has taken up to 300 mg po qd Effexor Paxil Lexapro- Ineffective for anxiety Cymbalta- was effective and then stopped Prozac Abilify-"more confused" and possible paradoxical reaction. Rexulti- Possible akathisia at higher dose Saphris- Minimal effect at 2.5 mg.  Buspar Propranolol- Not effective Lamictal- increased agitation Wellbutrin- Adverse effects, increased agitation Alprazolam- Effective Ativan- Ineffective Valium- Calming effect Ambien- Parasomnia Remeron- May have caused worsening confusion.    Review of Systems:  Review of Systems  Musculoskeletal: Negative for gait problem.       Reports muscle tension  Neurological: Negative for tremors.  Psychiatric/Behavioral:  Please refer to HPI    Medications: I have reviewed the patient's current medications.  Current Outpatient Medications  Medication Sig Dispense Refill  . aspirin EC 81 MG tablet Take 81 mg by mouth daily.    . Cholecalciferol (VITAMIN D3 PO) Take 2,000 Units by mouth  daily.    Marland Kitchen escitalopram (LEXAPRO) 20 MG tablet Take 1/2 tab po qd x 1 week, then increase to 1 tab po qd 30 tablet 1  . LORazepam (ATIVAN) 1 MG tablet Take 1 tablet (1 mg total) by mouth 2 (two) times daily as needed for anxiety. 60 tablet 2  . memantine (NAMENDA) 10 MG tablet Take 1 tablet (10 mg total) by mouth 2 (two) times daily. 60 tablet 12  . simvastatin (ZOCOR) 40 MG tablet Take 40 mg by mouth every evening.    . Tamsulosin HCl (FLOMAX) 0.4 MG CAPS Take 1 mg by mouth daily.     No current facility-administered medications for this visit.     Medication Side Effects: Other: Reports possible worsening confusion  Allergies:  Allergies  Allergen Reactions  . Abilify [Aripiprazole] Other (See Comments)    Short term memory loss  . Lamictal [Lamotrigine]     Increased agitation  . Wellbutrin [Bupropion] Anxiety    Patient does not recall allergy type Anxiety, confusion per Dr Edwin Dada note    Past Medical History:  Diagnosis Date  . Anxiety   . BPH (benign prostatic hyperplasia)   . Dementia ([place])   . Depression   . Hyperlipidemia   . Hypertension   . Kidney stone   . Memory loss   . Sleep apnea    CPAP  . Vitamin D deficiency     Family History  Problem Relation Age of Onset  . Alzheimer's disease Mother   . Depression Mother   . Cancer Father        prostate  . Thyroid disease Father   . Alzheimer's disease Maternal Grandmother   . Heart disease Maternal Grandfather   . Stroke Paternal Grandmother   . Stroke Paternal Grandfather   . Alcoholism Brother   . Autism Daughter   . Autism Maternal Uncle     Social History   Socioeconomic History  . Marital status: Legally Separated    Spouse name: [name]  . Number of children: 2  . Years of education: 69  . 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 Financial resource strain: Not on file  . Food insecurity    Worry: Not on file     Inability: Not on file  . Transportation needs    Medical: Not on file    Non-medical: Not on file  Tobacco Use  . Smoking status: Never Smoker  . Smokeless tobacco: Never Used  Substance and Sexual Activity  . Alcohol use: Yes    Alcohol/week: 1.0 - 2.0 standard drinks    Types: 1 - 2 Shots of liquor per week    Comment: 1-2 shots 4-5 x week  . Drug use: Yes    Types: Marijuana    Comment: Occasional  . Sexual activity: Never  Lifestyle  . Physical activity    Days per week: Not on file    Minutes per session: Not on file  . Stress: Not on file  Relationships  . Social Herbalist on phone: Not on file    Gets together:  Not on file    Attends religious service: Not on file    Active member of club or organization: Not on file    Attends meetings of clubs or organizations: Not on file    Relationship status: Not on file  . Intimate partner violence    Fear of current or ex partner: Not on file    Emotionally abused: Not on file    Physically abused: Not on file    Forced sexual activity: Not on file  Other Topics Concern  . Not on file  Social History Narrative   Lives alone retired.  Education: BS, MS Phd chemist.  Separated 8 yrs a/o spring 2020.  Adult children son, out of home, and daughter, adult with autistic spectrum, lives with estranged wife.  2 gks.  Mother died at age 54, of COVID, in the [place] outbreak -- emotionally destabilizing event late May/early June.        Cokes 0-3 daily as reported by nursing Sept 2019.  Gets medications via blister pack prepared by pharmacy a/o spring 2020, with generally good compliance but some mixing up of times and dates.    Past Medical History, Surgical history, Social history, and Family history were reviewed and updated as appropriate.   Please see review of systems for further details on the patient's review from today.   Objective:   Physical Exam:  There were no vitals taken for this visit.  Physical  Exam Constitutional:      General: He is not in acute distress.    Appearance: He is well-developed.  Musculoskeletal:        General: No deformity.  Neurological:     Mental Status: He is alert.     Coordination: Coordination normal.     Gait: Gait normal.  Psychiatric:        Attention and Perception: He is inattentive.        Mood and Affect: Mood is anxious and depressed. Affect is labile.        Speech: Speech normal.        Behavior: Behavior is agitated.        Thought Content: Thought content is delusional. Thought content includes suicidal ideation. Thought content does not include homicidal ideation. Thought content includes suicidal plan. Thought content does not include homicidal plan.        Cognition and Memory: Cognition is impaired. Memory is impaired. He exhibits impaired recent memory.        Judgment: Judgment is impulsive and inappropriate.     Comments: Pt presents as restless on exam. Ruminates on finding lost items and repeatedly looks for glasses and credit cards on exam.  Pt tearful at times. Limited insight.      Lab Review:     Component Value Date/Time   NA 139 06/23/2018 1834   K 4.2 06/23/2018 1834   CL 106 06/23/2018 1834   CO2 24 06/23/2018 1834   GLUCOSE 89 06/23/2018 1834   BUN 17 06/23/2018 1834   CREATININE 1.32 (H) 06/23/2018 1834   CALCIUM 9.2 06/23/2018 1834   PROT 6.4 (L) 06/23/2018 1834   ALBUMIN 4.1 06/23/2018 1834   AST 17 06/23/2018 1834   ALT 14 06/23/2018 1834   ALKPHOS 46 06/23/2018 1834   BILITOT 0.9 06/23/2018 1834   GFRNONAA 54 (L) 06/23/2018 1834   GFRAA >60 06/23/2018 1834       Component Value Date/Time   WBC 11.3 (H) 06/23/2018 1834   RBC 4.42 06/23/2018  1834   HGB 13.6 06/23/2018 1834   HCT 42.3 06/23/2018 1834   PLT 258 06/23/2018 1834   MCV 95.7 06/23/2018 1834   MCH 30.8 06/23/2018 1834   MCHC 32.2 06/23/2018 1834   RDW 12.4 06/23/2018 1834    No results found for: POCLITH, LITHIUM   No results found  for: PHENYTOIN, PHENOBARB, VALPROATE, CBMZ   .res Assessment: Plan:   Case staffed with supervising physician, Dr. Lynder Parents.  Recommend inpatient hospitalization due to suicidal thoughts, recent onset of psychotic signs and symptoms, worsening anxiety and agitation, depression, and safety issues around poor self-care and inability to manage medications independently.   Patient and Rudene Anda agree with plan for patient to be evaluated in the emergency room for possible hospital admission.  Rudene Anda agrees to transport patient to [facility] and patient agrees with this plan.  Contacted triage nurse in the emergency room at [facility] behavioral [facility] to provide information regarding patient.  [place] was seen today for anxiety, depression and other.  Diagnoses and all orders for this visit:  Generalized anxiety disorder  Psychosis, unspecified psychosis type ([place])  Depression, unspecified depression type     Please see After Visit Summary for patient specific instructions.  Future Appointments  Date Time Provider [place]  10/11/2018  3:00 PM Blanchie Serve, PhD CP-CP None    No orders of the defined types were placed in this encounter.   -------------------------------

## 2018-10-11 ENCOUNTER — Ambulatory Visit: Payer: Medicare Other | Admitting: Psychiatry

## 2018-10-12 ENCOUNTER — Other Ambulatory Visit: Payer: Self-pay | Admitting: Psychiatry

## 2018-10-12 DIAGNOSIS — F411 Generalized anxiety disorder: Secondary | ICD-10-CM

## 2018-10-12 DIAGNOSIS — F330 Major depressive disorder, recurrent, mild: Secondary | ICD-10-CM

## 2018-10-17 ENCOUNTER — Ambulatory Visit: Payer: Self-pay | Admitting: Diagnostic Neuroimaging

## 2018-10-22 ENCOUNTER — Other Ambulatory Visit: Payer: Self-pay | Admitting: Psychiatry

## 2018-10-22 DIAGNOSIS — F330 Major depressive disorder, recurrent, mild: Secondary | ICD-10-CM

## 2018-10-22 DIAGNOSIS — F411 Generalized anxiety disorder: Secondary | ICD-10-CM

## 2018-10-23 ENCOUNTER — Other Ambulatory Visit: Payer: Self-pay

## 2018-10-26 ENCOUNTER — Ambulatory Visit (INDEPENDENT_AMBULATORY_CARE_PROVIDER_SITE_OTHER): Payer: Medicare Other | Admitting: Psychiatry

## 2018-10-26 ENCOUNTER — Other Ambulatory Visit: Payer: Self-pay

## 2018-10-26 ENCOUNTER — Telehealth: Payer: Self-pay | Admitting: Psychiatry

## 2018-10-26 DIAGNOSIS — G3184 Mild cognitive impairment, so stated: Secondary | ICD-10-CM

## 2018-10-26 DIAGNOSIS — F330 Major depressive disorder, recurrent, mild: Secondary | ICD-10-CM

## 2018-10-26 DIAGNOSIS — E559 Vitamin D deficiency, unspecified: Secondary | ICD-10-CM

## 2018-10-26 DIAGNOSIS — F411 Generalized anxiety disorder: Secondary | ICD-10-CM

## 2018-10-26 DIAGNOSIS — E538 Deficiency of other specified B group vitamins: Secondary | ICD-10-CM

## 2018-10-26 NOTE — Telephone Encounter (Signed)
Wanted to know if this med is ok to continue Diphenhydramine. Dr Celesta Aver prescribed according to his bottle., but he wanted to ask you.

## 2018-10-26 NOTE — Progress Notes (Signed)
Psychotherapy Progress Note [facility] [name], PhD LP  Patient ID: David [name]     MRN: [id number]     Therapy format: Individual psychotherapy Date: 10/26/2018     Start: 10:03a Stop: 10:50a Time Spent: 47 min Location: in-person   Session narrative (presenting needs, interim history, self-report of stressors and symptoms, applications of prior therapy, status changes, and interventions made in session) Estranged wife and POA not involved in session -- not sure if she meant to, but presume she had scheduling info from hospital discharge planning.  Been to hospital in [place] since last seen.  "Very salubrious for me" and helped settle the trauma of last hospitalization, relationships and milieu very helpful.  Confirms it has established more clarity of thought, tamed some anxiety, better oriented, less consumed with mother's death and reuniting with wife and daughter.  Aware that his compulsive checking with [name] is irritating.  Trying to use daytimer as independently as possible, but emotions tied to it are troubling.  Fears [name] tendency to control, keeps hearing the dictum from the 60s, "Just because you're paranoid doesn't mean they aren't still out to get you."  Was getting anxious trying to locate daytimer, at and after discharge, turned out several days alter to be in [place] desk.  Encouraged to trust that she was not doing that to control him, merely to keep it safe, and, as she said, forgot about it in the rush of taking care of many things.  Continuing concerns that [name]  Continuing concern that he be free to handle some bills for self-esteem's sake.  Says [name] has history of job loss due to conflict and department politics, and becoming unruly/outspoken with superiors.  Advised he put challenges to [facility] in the form of a question -- better chance of her thinking more than feeling.    Re. organization -- seems to be  using daytimer effectively now, putting scheduling information in appropriate spaces.  [name] still preparing blister packs.  Says he is staying oriented to time and date since release.    Re. nutrition -- gets lots of dairy, seems to get enough fluids, frequent urination, regular stool (dark and firm), says he tends to be higher in protein. Offered to shift more toward good fats for digestive and metabolic benefit.  Noted hospital findings that he was deficient in D and B12, encouraged that supplementing these should help him with mental clarity and emotional resiliency.  Re. OSA -- encouraged retry CPAP, as sleep quality will affect both mood and mentality.  Therapeutic modalities: Cognitive Behavioral Therapy and Psycho-education/Bibliotherapy  Mental Status/Observations:  Appearance:   Casual     Behavior:  Appropriate  Motor:  Normal  Speech/Language:   Clear and Coherent  Affect:  Appropriate and nonlabile  Mood:  brighter, mildly uncomfortable  Thought process:  normal  Thought content:    WNL and some suspicion  Sensory/Perceptual disturbances:    WNL  Orientation:  grossly intact  Attention:  Good  Concentration:  Fair  Memory:  grossly intact, mild repetitiveness about schedule and arrangements  Insight:    Fair  Judgment:   Good  Impulse Control:  Good   Risk Assessment: Danger to Self: No Self-injurious Behavior: No Danger to Others: No Physical Aggression / Violence: No Duty to Warn: No Access to Firearms a concern: No  Assessment of progress:  progressing  Diagnosis:   ICD-10-CM   1. Generalized anxiety disorder  F41.1  2. MDD (major depressive disorder), recurrent episode, mild (HCC)  F33.0   3. Mild cognitive impairment  G31.84   4. B12 deficiency  E53.8   5. Vitamin D deficiency  E55.9     Plan:  . Continue use of daytimer . [name] for further coordination . Try to allow [name] her own discretion about [name] . Other  recommendations/advice as noted above . Continue to utilize previously learned skills ad lib . Maintain medication as prescribed and work faithfully with relevant prescriber(s) if any changes are desired or seem indicated . Call the clinic on-call service, present to ER, or call 911 if any life-threatening psychiatric crisis . Return in about 1 week (around 11/02/2018) for as available -- recommend Q 1-2 wks and include [name] when able.   . Med check Monday 7/27 with Ms. Eulas Post.   Blanchie Serve, PhD [name], PhD LP Clinical Psychologist, [facility] [address], [place], [place] [address] [phone]

## 2018-10-29 ENCOUNTER — Ambulatory Visit: Payer: Medicare Other | Admitting: Psychiatry

## 2018-11-02 ENCOUNTER — Ambulatory Visit (INDEPENDENT_AMBULATORY_CARE_PROVIDER_SITE_OTHER): Payer: Medicare Other | Admitting: Psychiatry

## 2018-11-02 ENCOUNTER — Other Ambulatory Visit: Payer: Self-pay

## 2018-11-02 DIAGNOSIS — Z634 Disappearance and death of family member: Secondary | ICD-10-CM

## 2018-11-02 DIAGNOSIS — F411 Generalized anxiety disorder: Secondary | ICD-10-CM | POA: Diagnosis not present

## 2018-11-02 DIAGNOSIS — E538 Deficiency of other specified B group vitamins: Secondary | ICD-10-CM | POA: Diagnosis not present

## 2018-11-02 DIAGNOSIS — G3184 Mild cognitive impairment, so stated: Secondary | ICD-10-CM

## 2018-11-02 DIAGNOSIS — F330 Major depressive disorder, recurrent, mild: Secondary | ICD-10-CM

## 2018-11-02 DIAGNOSIS — E559 Vitamin D deficiency, unspecified: Secondary | ICD-10-CM

## 2018-11-02 NOTE — Progress Notes (Signed)
Psychotherapy Progress Note [facility] [name], PhD LP  Patient ID: David [name]     MRN: [id number]     Therapy format: Individual psychotherapy Date: 11/02/2018     Start: 11:14a Stop: 12:05p Time Spent: 51 min Location: in-person   Session narrative (presenting needs, interim history, self-report of stressors and symptoms, applications of prior therapy, status changes, and interventions made in session) Nagging sense of having an appointment or needing to be somewhere, and doubts whether he is in the right place, right time.  Trained as a kid to be very punctual, and cognitive decline has exacerbated doubt.  Using pocket daytimer for reliable memory and management of schedule and a few self-reminders, much better organized than pre-hospital.  Discussed anxiety over time and commitments vs. sources of orientation.  Notes mistrust of phone/clock sometimes, consistent with longstanding personality trying and worrying over being responsible enough, the need to be available, flexible, loyal, family-first.    Grew up with close touch with maternal grandparents, feels responsibility to carry on the same intergenerational presence and generativity with his.  Son Bland Span has a consuming job that puts him out of town a lot, potentially at risk for pandemic, some worry about the family's safety.    Wife called mid-session, thought his appt was the previous hour, no move made to engage [place], so continuing appearance that she is disengaging from therapy.  May need outreach to clarify expectations, including possibility that she came to regard [place] as an obstacle to her own goals based on expressed doubt earlier that PT made inpt criteria.  PT, meanwhile, views the phone as an electronic "leash", continues to characterize [place] as overconcerned and too interested.  Reaffirmed verbal consent to involve her in treatment, as she has been prosthetic Environmental education officer and holds legal  POA.  Plumbing situation is delayed -- the leak is stopped, and no report of tense dealing with neighbors, but his own plumbing repairs lie unfinished.  Knows he will be the one who needs to be present when next workers come next.  Discussed tendency to agonize over delays, able to get him to laugh at the tendency.  Re. mother, does have moments feeling the old reflexes to call her, take care of something, has to remind himself she is passed, and in a better place.  Confirms he is free to talk as if to her without blurring reality and without fear of being taken as someone who is disoriented.  Brought a notebook with him carrying discharge instructions and resources.  Did not bring meds with him this time, demonstrating learning from past experience.  Made copy of self-mgmt plan.  No known plan for in-home assistance at this stage.  Says he is keeping up with housekeeping independently, about 80% cleaned up, not emotionally provocative.  Driving independently and making appointments.  Therapeutic modalities: Cognitive Behavioral Therapy and Solution-Oriented/Positive Psychology  Mental Status/Observations:  Appearance:   Casual     Behavior:  Appropriate  Motor:  Normal  Speech/Language:   Clear and Coherent  Affect:  Appropriate and responsive  Mood:  generally euthymic, modulated sadness with bereavement  Thought process:  normal  Thought content:    WNL  Sensory/Perceptual disturbances:    WNL  Orientation:  grossly intact  Attention:  Good  Concentration:  Fair  Memory:  generally intact, some soft spots  Insight:    Good  Judgment:   Good  Impulse Control:  Good   Risk Assessment: Danger to  Self: No Self-injurious Behavior: No Danger to Others: No Physical Aggression / Violence: No Duty to Warn: No Access to Firearms a concern: No  Assessment of progress:  progressing modestly  Diagnosis:   ICD-10-CM   1. Generalized anxiety disorder  F41.1   2. MDD (major depressive  disorder), recurrent episode, mild ([place])  F33.0   3. Mild cognitive impairment  G31.84   4. B12 deficiency  E53.8   5. Vitamin D deficiency  E55.9   6. Bereavement  Z63.4    Plan:  . Continue use of organizer . Try to note and control tendency to agonize over arrangements . Request Lora Havens in therapy enough to inform, assess, and coordinate over referrals and support services . Other recommendations/advice as noted above . Continue to utilize previously learned skills ad lib . Maintain medication as prescribed and work faithfully with relevant prescriber(s) if any changes are desired or seem indicated . Call the clinic on-call service, present to ER, or call 911 if any life-threatening psychiatric crisis Return in about 1 week (around 11/09/2018) for set up as in-person.   Blanchie Serve, PhD [name], PhD LP Clinical Psychologist, [facility] [address] [place], [place] [address] [phone]

## 2018-11-05 ENCOUNTER — Encounter: Payer: Self-pay | Admitting: Psychiatry

## 2018-11-05 ENCOUNTER — Other Ambulatory Visit: Payer: Self-pay

## 2018-11-05 ENCOUNTER — Ambulatory Visit (INDEPENDENT_AMBULATORY_CARE_PROVIDER_SITE_OTHER): Payer: Medicare Other | Admitting: Psychiatry

## 2018-11-05 DIAGNOSIS — F330 Major depressive disorder, recurrent, mild: Secondary | ICD-10-CM

## 2018-11-05 DIAGNOSIS — F411 Generalized anxiety disorder: Secondary | ICD-10-CM | POA: Diagnosis not present

## 2018-11-05 MED ORDER — ESCITALOPRAM OXALATE 20 MG PO TABS: 20.0000 mg | ORAL_TABLET | Freq: Every day | ORAL | 1 refills | Status: DC

## 2018-11-05 NOTE — Progress Notes (Signed)
David [name] [id number] October 11, 1945 73 y.o.  Subjective:   Patient ID:  David [name] is a 73 y.o. (DOB September 25, 1945) male.  Chief Complaint:  Chief Complaint  Patient presents with  . Follow-up    Hospital discharge follow-up appointment    HPI David [name] presents to the office today for follow-up of anxiety, depression, and acute confusion. He is being seen for f/u after recent psychiatric hospitalization at [facility]. He denies any SI since hospitalization.   He reports "there have been some dark moments." He reports that he tries to direct his thoughts to things that have meaning to him, such as family. He reports that he continues to grieve the loss of his mother. He reports anxiety "comes and goes."  He reports that he has been exercising some and walking a few times a week. He reports that his appetite has been good and [name] notes an increase in his appetite. He reports that he has not noticed a significant change in his weight. He reports frequent difficulty falling asleep because of anxiety that he has left something unfinished related to his mother's affairs. He reports that he will read "[employer]" when he is having difficulty falling asleep. He reports sleeping at least 5-6 consecutive hours of sleep and will occ take a nap if needed. He reports occ panic attacks. Reports that it is helpful for him to drink something cold or take a shower when panic attacks occur. Reports that energy and motivation "comes and goes." He reports that concentration is ok if there are no interruptions but is easily distracted.   They both report that agitation and irritability have decreased but continue to occur on occasion. He denies AH, VH, or paranoia.   [name] joins visit 10 minutes after start of visit. She reports that there has "been some leveling out." She reports that his anxiety has improved overall. She reports that he continues to have some  forgetfulness, particularly later in the day. She reports that his mood also seems to be improved.    Review of Systems:  Review of Systems  Musculoskeletal: Negative for gait problem.  Neurological: Positive for tremors.       Improved chronic right hand tremor  Psychiatric/Behavioral:       Please refer to HPI    Medications: I have reviewed the patient's current medications.  Current Outpatient Medications  Medication Sig Dispense Refill  . aspirin EC 81 MG tablet Take 81 mg by mouth daily.    Marland Kitchen buPROPion (WELLBUTRIN XL) 150 MG 24 hr tablet Take by mouth.    . Cholecalciferol (VITAMIN D3 PO) Take 2,000 Units by mouth daily.    Marland Kitchen donepezil (ARICEPT) 5 MG tablet Take by mouth.    . escitalopram (LEXAPRO) 20 MG tablet Take 1 tablet (20 mg total) by mouth daily. 30 tablet 1  . LORazepam (ATIVAN) 0.5 MG tablet Take 0.5 mg by mouth at bedtime.     . memantine (NAMENDA) 10 MG tablet Take 1 tablet (10 mg total) by mouth 2 (two) times daily. 60 tablet 12  . OLANZapine (ZYPREXA) 5 MG tablet Take by mouth.    . simvastatin (ZOCOR) 40 MG tablet Take 40 mg by mouth every evening.    . Tamsulosin HCl (FLOMAX) 0.4 MG CAPS Take 1 mg by mouth daily.    . vitamin B-12 (CYANOCOBALAMIN) 1000 MCG tablet Take 1,000 mcg by mouth daily.     No current facility-administered medications for this  visit.     Medication Side Effects: Other: Increased appetite  Allergies:  Allergies  Allergen Reactions  . Abilify [Aripiprazole] Other (See Comments)    Short term memory loss  . Lamictal [Lamotrigine]     Increased agitation  . Wellbutrin [Bupropion] Anxiety    Patient does not recall allergy type Anxiety, confusion per Dr Edwin Dada note    Past Medical History:  Diagnosis Date  . Anxiety   . BPH (benign prostatic hyperplasia)   . Dementia ([place])   . Depression   . Hyperlipidemia   . Hypertension   . Kidney stone   . Memory loss   . Sleep apnea    CPAP  . Vitamin D deficiency     Family  History  Problem Relation Age of Onset  . Alzheimer's disease Mother   . Depression Mother   . Cancer Father        prostate  . Thyroid disease Father   . Alzheimer's disease Maternal Grandmother   . Heart disease Maternal Grandfather   . Stroke Paternal Grandmother   . Stroke Paternal Grandfather   . Alcoholism Brother   . Autism Daughter   . Autism Maternal Uncle     Social History   Socioeconomic History  . Marital status: Legally Separated    Spouse name: [name]  . Number of children: 2  . Years of education: 55  . 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 Financial resource strain: Not on file  . Food insecurity    Worry: Not on file    Inability: Not on file  . Transportation needs    Medical: Not on file    Non-medical: Not on file  Tobacco Use  . Smoking status: Never Smoker  . Smokeless tobacco: Never Used  Substance and Sexual Activity  . Alcohol use: Yes    Alcohol/week: 1.0 - 2.0 standard drinks    Types: 1 - 2 Shots of liquor per week    Comment: 1-2 shots 4-5 x week  . Drug use: Yes    Types: Marijuana    Comment: Occasional  . Sexual activity: Never  Lifestyle  . Physical activity    Days per week: Not on file    Minutes per session: Not on file  . Stress: Not on file  Relationships  . Social Herbalist on phone: Not on file    Gets together: Not on file    Attends religious service: Not on file    Active member of club or organization: Not on file    Attends meetings of clubs or organizations: Not on file    Relationship status: Not on file  . Intimate partner violence    Fear of current or ex partner: Not on file    Emotionally abused: Not on file    Physically abused: Not on file    Forced sexual activity: Not on file  Other Topics Concern  . Not on file  Social History Narrative   Lives alone retired.  Education: BS, MS Phd chemist.  Separated 8 yrs a/o  spring 2020.  Adult children son, out of home, and daughter, adult with autistic spectrum, lives with estranged wife.  2 gks.  Mother died at age 72, of COVID, in the [place] outbreak -- emotionally destabilizing event late May/early June.        Cokes 0-3 daily as reported  by nursing Sept 2019.  Gets medications via blister pack prepared by pharmacy a/o spring 2020, with generally good compliance but some mixing up of times and dates.    Past Medical History, Surgical history, Social history, and Family history were reviewed and updated as appropriate.   Please see review of systems for further details on the patient's review from today.   Objective:   Physical Exam:  There were no vitals taken for this visit.  Physical Exam Constitutional:      General: He is not in acute distress.    Appearance: He is well-developed.  Musculoskeletal:        General: No deformity.  Neurological:     Mental Status: He is alert and oriented to person, place, and time.     Coordination: Coordination normal.  Psychiatric:        Attention and Perception: Perception normal. He does not perceive auditory or visual hallucinations.        Mood and Affect: Mood is not anxious or depressed. Affect is not labile, blunt, angry or inappropriate.        Speech: Speech normal.        Behavior: Behavior normal. Behavior is not agitated.        Thought Content: Thought content normal. Thought content is not paranoid or delusional. Thought content does not include homicidal or suicidal ideation. Thought content does not include homicidal or suicidal plan.        Cognition and Memory: He exhibits impaired recent memory.        Judgment: Judgment normal.     Comments: Patient presents as significantly less anxious, agitated, and depressed compared to Office visit prior to hospitalization. Patient also presents as less confused and disorganized.     Lab Review:     Component Value Date/Time   NA 139 06/23/2018  1834   K 4.2 06/23/2018 1834   CL 106 06/23/2018 1834   CO2 24 06/23/2018 1834   GLUCOSE 89 06/23/2018 1834   BUN 17 06/23/2018 1834   CREATININE 1.32 (H) 06/23/2018 1834   CALCIUM 9.2 06/23/2018 1834   PROT 6.4 (L) 06/23/2018 1834   ALBUMIN 4.1 06/23/2018 1834   AST 17 06/23/2018 1834   ALT 14 06/23/2018 1834   ALKPHOS 46 06/23/2018 1834   BILITOT 0.9 06/23/2018 1834   GFRNONAA 54 (L) 06/23/2018 1834   GFRAA >60 06/23/2018 1834       Component Value Date/Time   WBC 11.3 (H) 06/23/2018 1834   RBC 4.42 06/23/2018 1834   HGB 13.6 06/23/2018 1834   HCT 42.3 06/23/2018 1834   PLT 258 06/23/2018 1834   MCV 95.7 06/23/2018 1834   MCH 30.8 06/23/2018 1834   MCHC 32.2 06/23/2018 1834   RDW 12.4 06/23/2018 1834    No results found for: POCLITH, LITHIUM   No results found for: PHENYTOIN, PHENOBARB, VALPROATE, CBMZ   .res Assessment: Plan:   Reviewed hospital discharge summary, discharge medication list, and patient's pill packs from pharmacy.  Patient noted to have Lexapro 10 mg tabs in pill pack and discharge summary and discharge instructions indicated continuing Lexapro 20 mg daily.  Prescription sent to pharmacy for Lexapro 20 mg daily. Will continue all other medications as prescribed. Recommended that they may wish to f/u with PCP re: elevated Cr and bilirubin that was noted during hospitalization. Recommended simplifying medication administration as much as possible by adding all medications to pill pack.  Discussed that they may wish to consider talking  with pharmacy about adding supplements and vitamins to pill pack to improve compliance and decrease medication burden.  Discussed that this provider can provide an order for vitamins if needed to be included and pill pack. Recommend continuing psychotherapy with Ina Homes, PhD. Patient to follow-up in 4 weeks or sooner if clinically indicated. Patient advised to contact office with any questions, adverse effects, or acute  worsening in signs and symptoms.  Rickardo was seen today for follow-up.  Diagnoses and all orders for this visit:  MDD (major depressive disorder), recurrent episode, mild (HCC) -     escitalopram (LEXAPRO) 20 MG tablet; Take 1 tablet (20 mg total) by mouth daily.  Generalized anxiety disorder -     escitalopram (LEXAPRO) 20 MG tablet; Take 1 tablet (20 mg total) by mouth daily.     Please see After Visit Summary for patient specific instructions.  Future Appointments  Date Time Provider [place]  11/09/2018 10:00 AM Blanchie Serve, PhD CP-CP None  12/04/2018 10:15 AM [name], PMHNP CP-CP None    No orders of the defined types were placed in this encounter.   -------------------------------

## 2018-11-05 NOTE — Progress Notes (Signed)
   11/05/18 1406  Facial and Oral Movements  Muscles of Facial Expression 0  Lips and Perioral Area 0  Jaw 0  Tongue 0  Extremity Movements  Upper (arms, wrists, hands, fingers) 0  Lower (legs, knees, ankles, toes) 0  Trunk Movements  Neck, shoulders, hips 0  Overall Severity  Severity of abnormal movements (highest score from questions above) 0  Incapacitation due to abnormal movements 0  Patient's awareness of abnormal movements (rate only patient's report) 0  Dental Status  Current problems with teeth and/or dentures? No  Does patient usually wear dentures? No  AIMS Total Score  AIMS Total Score 0

## 2018-11-08 ENCOUNTER — Other Ambulatory Visit: Payer: Self-pay | Admitting: Psychiatry

## 2018-11-09 ENCOUNTER — Other Ambulatory Visit: Payer: Self-pay

## 2018-11-09 ENCOUNTER — Ambulatory Visit (INDEPENDENT_AMBULATORY_CARE_PROVIDER_SITE_OTHER): Payer: Medicare Other | Admitting: Psychiatry

## 2018-11-09 DIAGNOSIS — E559 Vitamin D deficiency, unspecified: Secondary | ICD-10-CM

## 2018-11-09 DIAGNOSIS — F411 Generalized anxiety disorder: Secondary | ICD-10-CM

## 2018-11-09 DIAGNOSIS — G3184 Mild cognitive impairment, so stated: Secondary | ICD-10-CM | POA: Diagnosis not present

## 2018-11-09 DIAGNOSIS — E538 Deficiency of other specified B group vitamins: Secondary | ICD-10-CM

## 2018-11-09 DIAGNOSIS — F659 Paraphilia, unspecified: Secondary | ICD-10-CM

## 2018-11-09 DIAGNOSIS — F330 Major depressive disorder, recurrent, mild: Secondary | ICD-10-CM | POA: Diagnosis not present

## 2018-11-09 NOTE — Progress Notes (Signed)
Psychotherapy Progress Note [facility] [name], PhD LP  Patient ID: David [name]     MRN: [id number]     Therapy format: Individual psychotherapy Date: 11/09/2018     Start: 10:14a Stop: 11:05a Time Spent: 51 min Location: in-person   Session narrative (presenting needs, interim history, self-report of stressors and symptoms, applications of prior therapy, status changes, and interventions made in session) Stressed with David [name], feels exacerbation of conflict yesterday, didn't sleep last night.  Knows his history of paraphilia has left her untrusting, wired about him spending any time alone with daughter.  Believes David [name] accepts him when she needs staffing, plays the paraphilia card when she has something else she wants, some "leverage".  Believes David [name] may envy or resent David [name] enjoying time with PT, though no frank difference with each other in parenting values or limits.  Regrets history of open conflict in front of the kids.  Trying to hold tongue, take himself for walks instead of arguing.  4/wk walks, which help with weight, social encounters, enjoyment of nature, etc.  Seeing better relating to [name] of late.  Trying to be less easily baited into conflict, partly by spending quality time with son.    Says, credibly, that he is keeping up with bills, ADLs, nutrition, commitments, and managing schedule -- with aid of his daytimer -- for health care appointments and commitments to be responsible adult for David [name], share meals as family (Tues/Fri).  Unclear at this point what the role and relationship are to be for support and exercise of HCPOA by David [name], and whether there are contingency plans or plans under way for PT's care as mental state declines.  Also, no plans stated re. resuming neurological care or continuing to leave it under behavioral health and primary care.  Agreed [place] may call David [name] outside session, PT will prompt her to expect inquiry.  Therapeutic  modalities: Cognitive Behavioral Therapy and Solution-Oriented/Positive Psychology  Mental Status/Observations:  Appearance:   Casual     Behavior:  Appropriate  Motor:  Normal  Speech/Language:   Clear and Coherent  Affect:  Appropriate  Mood:  anxious and considerably less  Thought process:  normal  Thought content:    WNL  Sensory/Perceptual disturbances:    WNL  Orientation:  Oriented to Wednesday  Attention:  Good  Concentration:  Good  Memory:  grossly intact  Insight:    Good  Judgment:   Good  Impulse Control:  Good   Risk Assessment: Danger to Self: No Self-injurious Behavior: No Danger to Others: No Physical Aggression / Violence: No Duty to Warn: No Access to Firearms a concern: No  Assessment of progress:  progressing  Diagnosis:   ICD-10-CM   1. MDD (major depressive disorder), recurrent episode, mild ([name])  F33.0   2. Generalized anxiety disorder  F41.1   3. Mild cognitive impairment  G31.84   4. B12 deficiency  E53.8   5. Vitamin D deficiency  E55.9   6. 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 PT to inform [name] would like to reach her soon to confirm plans and working understandings around home care and future contingencies, assess sentinel system for downturns . Other recommendations/advice as noted above . Continue to utilize previously learned skills ad lib . Maintain medication as prescribed and work faithfully with relevant prescriber(s) if any changes are desired or seem indicated . Call the clinic on-call service, present to ER,  or call 911 if any life-threatening psychiatric crisis Return in about 1 week (around 11/16/2018) for should schedule ahead -- try to get 3 wks ahead.  Blanchie Serve, PhD [name], PhD LP Clinical Psychologist, [facility] [address], [place], [place] [address] [phone]

## 2018-11-11 ENCOUNTER — Other Ambulatory Visit: Payer: Self-pay | Admitting: Psychiatry

## 2018-11-11 NOTE — Telephone Encounter (Signed)
Looks like he got from different provider on 07/20?

## 2018-11-16 ENCOUNTER — Other Ambulatory Visit: Payer: Self-pay

## 2018-11-16 ENCOUNTER — Ambulatory Visit (INDEPENDENT_AMBULATORY_CARE_PROVIDER_SITE_OTHER): Payer: Medicare Other | Admitting: Psychiatry

## 2018-11-16 ENCOUNTER — Encounter: Payer: Self-pay | Admitting: Psychiatry

## 2018-11-16 DIAGNOSIS — G3184 Mild cognitive impairment, so stated: Secondary | ICD-10-CM

## 2018-11-16 DIAGNOSIS — E538 Deficiency of other specified B group vitamins: Secondary | ICD-10-CM | POA: Diagnosis not present

## 2018-11-16 DIAGNOSIS — E559 Vitamin D deficiency, unspecified: Secondary | ICD-10-CM

## 2018-11-16 DIAGNOSIS — F330 Major depressive disorder, recurrent, mild: Secondary | ICD-10-CM

## 2018-11-16 DIAGNOSIS — F411 Generalized anxiety disorder: Secondary | ICD-10-CM

## 2018-11-16 DIAGNOSIS — Z630 Problems in relationship with spouse or partner: Secondary | ICD-10-CM

## 2018-11-16 NOTE — Assessment & Plan Note (Signed)
Have evaluated this patient over the last few months and do not believe this diagnosis fits his symptoms.  More apropos to anxiety disorder, depression, and cognitive decline.  [name], PhD LP

## 2018-11-16 NOTE — Progress Notes (Signed)
Psychotherapy Progress Note [facility] [name], PhD LP  Patient ID: David [name]     MRN: [id number]     Therapy format: Individual psychotherapy Date: 11/16/2018     Start: 11:25a Stop: 12:15p Time Spent: 50 min Location: in-person   Session narrative (presenting needs, interim history, self-report of stressors and symptoms, applications of prior therapy, status changes, and interventions made in session) Has workers coming at [employer] for his long-delayed Company secretary.  Tends to worry something will go wrong and he would not notice, or that something will be misdelivered and installed and then [place] will get upset, but not catastrophizing about it.  Has just rediscovered a leaking washer (unused for a time, just tried out and had spill), caught quickly enough, and already in touch with [name] about getting maintenance/replacement done.   Continues to rate current living situation as independent, with organizational assistance from [place], does not see a need for regular, credentialed help in the home.    Concerns for son Bland Span, who is crew chief for a moving company and is responsible sometimes to go to higher-infection areas during the pandemic.  Would like to go reminisce in [place] with him, show him family history spots, visit some family, as well as his parents' graves in [place].  Brother Herb, works in [employer] in [place]. one family member he'd also like to see more of.  Limited possibilities. Discussed possibilty of establishing video call capability, would need to work with [name] on Scientist, physiological.  Re. mental status, takes a bit to get oriented in the morning.  Typically previews the next day's tasks before bed, sets alarms he may need.  No caffeine, never a habit, but may have a cola at dinner.  No complaints of sleep disturbance.  Working relationship with [name] generally going better, no real complaints.  PT takes [name]'s less distressed behavior as  an indication that [name] herself is calmer and not getting on her nerves.  Attrib in part to newfound stability in home staffing (for adult ASD).    Affirmed and encouraged in non-cluttered use of his personal organizer, reasonable activities of the day, continuing to maintain home environment, and cordial working relationship with ex-wife/POA.  Request he consider what measures he would like to have in place for such time as he may experience more mental decline.  Open to having an outreach or peer support service if it can be found.  Meanwhile, maintain meds, vitamin regimen, nutrition.  Therapeutic modalities: Cognitive Behavioral Therapy and Solution-Oriented/Positive Psychology  Mental Status/Observations:  Appearance:   Casual     Behavior:  Appropriate  Motor:  Normal  Speech/Language:   Clear and Coherent  Affect:  Appropriate and positive  Mood:  euthymic  Thought process:  normal and no perseveration or notable interruptions in thought flow  Thought content:    WNL and worries, lighter than before  Sensory/Perceptual disturbances:    WNL  Orientation:  grossly intact  Attention:  Good  Concentration:  Good  Memory:  grossly intact, relies on Magazine features editor  Insight:    Good  Judgment:   Good  Impulse Control:  Good   Risk Assessment: Danger to Self: No Self-injurious Behavior: No Danger to Others: No Physical Aggression / Violence: No Duty to Warn: No Access to Firearms a concern: No  Assessment of progress:  progressing  Diagnosis:   ICD-10-CM   1. MDD (major depressive disorder), recurrent episode, mild ([place])  F33.0  2. Generalized anxiety disorder  F41.1   3. Mild cognitive impairment  G31.84   4. B12 deficiency  E53.8   5. Vitamin D deficiency  E55.9   6. Relationship problem between partners  Z63.0    Plan:  . Ask [name] to be in touch go over current assessment of his independence and contingency plans for home help as needed . Other  recommendations/advice as noted above . Continue to utilize previously learned skills ad lib . Maintain medication as prescribed and work faithfully with relevant prescriber(s) if any changes are desired or seem indicated . Call the clinic on-call service, present to ER, or call 911 if any life-threatening psychiatric crisis Return in about 1 week (around 11/23/2018).  Blanchie Serve, PhD [name], PhD LP Clinical Psychologist, [facility] [address], [place], [place] [address] [phone]

## 2018-11-23 ENCOUNTER — Ambulatory Visit (INDEPENDENT_AMBULATORY_CARE_PROVIDER_SITE_OTHER): Payer: Medicare Other | Admitting: Psychiatry

## 2018-11-23 DIAGNOSIS — G3184 Mild cognitive impairment, so stated: Secondary | ICD-10-CM

## 2018-11-23 DIAGNOSIS — F330 Major depressive disorder, recurrent, mild: Secondary | ICD-10-CM | POA: Diagnosis not present

## 2018-11-23 DIAGNOSIS — F411 Generalized anxiety disorder: Secondary | ICD-10-CM | POA: Diagnosis not present

## 2018-11-23 NOTE — Progress Notes (Signed)
Psychotherapy Progress Note [facility] [name], PhD LP  Patient ID: David [name]     MRN: [id number]     Therapy format: Phone call to collateral 21-30 min, with associated research and email followup Date: 11/23/2018     Start: 11:25a Stop: 12:00n Time Spent: 35 min Location: telehealth   Telehealth visit -- I connected with this patient by an approved telecommunication method (audio only), with his informed consent, and verifying identity and patient privacy.  I was located at my office and patient at his car.  As needed, we discussed the limitations, risks, and security and privacy concerns associated with telehealth service, including the availability and conditions which currently govern in-person appointments and the possibility that 3rd-party payment may not be fully guaranteed and he may be responsible for charges.  After he indicated understanding, we proceeded with the session.  Also discussed treatment planning, as needed, including ongoing verbal agreement with the plan, the opportunity to ask and answer all questions, his demonstrated understanding of instructions, and his readiness to call the office should symptoms worsen or he feels he is in a crisis state and needs more immediate and tangible assistance.  Session narrative (presenting needs, interim history, self-report of stressors and symptoms, applications of prior therapy, status changes, and interventions made in session) PT failed to show for 11am appointment.  Attempted to reach at home, no answer and no voice mail capability.  Emailed PT and [name] jointly to advise of missed time and upcoming appointments.  Called [name], presently in car on commute to work.  Confirms she has been trying to back off memory support with him and so he probably lost track of the appointment despite use of his Environmental education officer.  Today is also what would have been their 64th wedding anniversary, so it is possible he is pining  and overcome.  Confirms he has been considerably better off emotionally since hospitalization, rebalancing of medications and supplements (incl. B12 and D), improved appetite with a bit of weight gain and "moon face".  No contingency plans are set up for home care or higher level of supervision in the event dementia progresses, but he was authorized for [place] home health services following the hospital.  [name] has been in touch with three agencies, none of whom have psych nursing available.  Chartered loss adjuster service would be desirable, but it is not covered by insurance and is deemed unaffordable.  Re. housekeeping, PT has brushed her back about advising him and wants to do for himself.  Bathroom repairs are finished, to great relief, and [name] got him a couple of plants to take care of.  Therapeutic modalities: consultation  Mental Status/Observations: n/a  Risk Assessment: n/a -- defer to family, no acute safety issues suspected  Diagnosis:   ICD-10-CM   1. MDD (major depressive disorder), recurrent episode, mild ([place])  F33.0   2. Generalized anxiety disorder  F41.1   3. Mild cognitive impairment  G31.84    Plan:  . [name] will continue assessment and planning as able for eventual home health service . [name] informed of inability to reach PT, will check on hs welfare as deemed appropriate . [place] will look into supportive contact options through nonprofit agencies for the aged . Continue to encourage innovative ways to be in touch with relatives (e.g., video) . Possibly involve community agency such as Hospital doctor . Emailed to [employer] for [employer] . Maintain medication as prescribed and work faithfully with relevant  prescriber(s) if any changes are desired or seem indicated . Call the clinic on-call service, present to ER, or call 911 if any life-threatening psychiatric crisis Return in about 1 week (around 11/30/2018) for in-person.  Blanchie Serve, PhD [name], PhD LP Clinical Psychologist, [facility] [address], [place], [place] [address] [phone]

## 2018-11-30 ENCOUNTER — Ambulatory Visit (INDEPENDENT_AMBULATORY_CARE_PROVIDER_SITE_OTHER): Payer: Medicare Other | Admitting: Psychiatry

## 2018-11-30 ENCOUNTER — Other Ambulatory Visit: Payer: Self-pay

## 2018-11-30 DIAGNOSIS — F411 Generalized anxiety disorder: Secondary | ICD-10-CM

## 2018-11-30 DIAGNOSIS — Z630 Problems in relationship with spouse or partner: Secondary | ICD-10-CM

## 2018-11-30 DIAGNOSIS — G3184 Mild cognitive impairment, so stated: Secondary | ICD-10-CM | POA: Diagnosis not present

## 2018-11-30 DIAGNOSIS — E559 Vitamin D deficiency, unspecified: Secondary | ICD-10-CM

## 2018-11-30 DIAGNOSIS — R69 Illness, unspecified: Secondary | ICD-10-CM

## 2018-11-30 DIAGNOSIS — F330 Major depressive disorder, recurrent, mild: Secondary | ICD-10-CM | POA: Diagnosis not present

## 2018-11-30 DIAGNOSIS — Z634 Disappearance and death of family member: Secondary | ICD-10-CM | POA: Diagnosis not present

## 2018-11-30 DIAGNOSIS — E538 Deficiency of other specified B group vitamins: Secondary | ICD-10-CM

## 2018-11-30 NOTE — Progress Notes (Signed)
Psychotherapy Progress Note [facility] [name], PhD LP  Patient ID: David [name]     MRN: [id number]     Therapy format: Individual psychotherapy Date: 11/30/2018     Start: 11:25a Stop: 12:15p Time Spent: 50 min Location: in-person   Session narrative (presenting needs, interim history, self-report of stressors and symptoms, applications of prior therapy, status changes, and interventions made in session) Feeling more restless, out of a groove, family more dispersed by circumstances.  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  PT role as sounding board.  Continues to worry about [name] and [name] getting in conflict.  Knows it's not his "jurisdiction", and obviously worrying much less intensely, and more able to laugh, than before he was hospitalized.  Presumed regular with meds, which continue to come in blister packs prepared by his pharmacy.  Bathroom reno finished, looks good, just feels strange.  Just trying to get fluent with the new look and feel of it.  Continues to keep habit of Friday dinners and shabbat as a family (PT, ex-wife, Roslyn Smiling and his wife and 2 school-aged kids.  PT avg 1-2/wk at [employer] house helping with the grandkids, transportation or supervising, mostly just enjoying them.  37 how old grandson in (8-10, est.) and granddaughter (starting preschool, almost 3) are.  Individual time, reads [employer], cover to cover) and writes.  Finds the reading stimulating (sends me to the dictionary at least 1/mo).  Writing, "fitfully", often on scraps of paper, a journal/memoir.  Affirmed the value of writing to knit together a sense of self, and follow advice to pursue "recollections in tranquility."  Marrianne Mood, acc. to PT -- correctly quoted as the poet's analysis of how poetry is made.)  Discussed outlook for therapy -- Feeling well  enough, comfortable thinning out to every other week.  However, September tends to have religious holidays with strong meanings (Rosh [place], [place]) and family ties, and loss of mother in May is still poignant, evocative.  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  Reviewed possibility of more social connection in his weekly life, discussed Development worker, community.  Also discussed missed appt last Friday -- admittedly "spaced out" on what he knew to be 56th wedding anniversary (poignant for several years' separation over his paraphilia history) and the birthday of his daughter-in-law.  Also says there were further visits and procedures, last touches on his bathroom reno which may have been scheduled for competing time.  Unclear that PT and estranged wife/support fully reconcile calendars.  Recommend he make sure they do.  Therapeutic modalities: Cognitive Behavioral Therapy and Humanistic/Existential  Mental Status/Observations:  Appearance:   Casual     Behavior:  Appropriate  Motor:  Normal  Speech/Language:   Clear and Coherent  Affect:  Appropriate  Mood:  euthymic  Thought process:  some repetition/perseveration to content  Thought content:    WNL and mild repetititveness  Sensory/Perceptual disturbances:    WNL  Orientation:  grossly intact  Attention:  Good  Concentration:  Good  Memory:  grossly intact for conversation; acknowledged gaps  Insight:    Good  Judgment:   Good  Impulse Control:  Fair   Risk Assessment: Danger to Self: No Self-injurious Behavior: No Danger to Others: No Physical Aggression / Violence: No Duty to Warn: No Access to Firearms a concern: No  Assessment of progress:  progressing  Diagnosis:   ICD-10-CM   1. Generalized anxiety disorder  F41.1   2. Mild cognitive impairment  G31.84    variously  attributed as dementia, Alz Type  3. MDD (major depressive disorder), recurrent episode, mild ([place])  F33.0   4. Bereavement  Z63.4    mother, d. of COVID  5. Relationship problem between partners  Z63.0   6. B12 deficiency  E53.8   7. Vitamin D deficiency  E55.9   8. Suspected condition  R69    OCPD traits; hx of NPD diagnosis, considered erroneous   Plan:  . Continue to live into renovated bathroom . Clarify/reconcile calendar with [name] . Referral to [employer] . Option to focus therapy time on mother's legacy and PT's mission in life at this point . Other recommendations/advice as noted above . Continue to utilize previously learned skills ad lib . Maintain medication as prescribed and work faithfully with relevant prescriber(s) if any changes are desired or seem indicated . Call the clinic on-call service, present to ER, or call 911 if any life-threatening psychiatric crisis Return in about 1 week (around 12/07/2018) for session(s) already scheduled, in-person.  Blanchie Serve, PhD [name], PhD LP Clinical Psychologist, [facility] [address], [place], [place] [address] [phone]

## 2018-12-03 ENCOUNTER — Other Ambulatory Visit: Payer: Self-pay | Admitting: Psychiatry

## 2018-12-03 DIAGNOSIS — F411 Generalized anxiety disorder: Secondary | ICD-10-CM

## 2018-12-03 DIAGNOSIS — F330 Major depressive disorder, recurrent, mild: Secondary | ICD-10-CM

## 2018-12-04 ENCOUNTER — Ambulatory Visit (INDEPENDENT_AMBULATORY_CARE_PROVIDER_SITE_OTHER): Payer: Medicare Other | Admitting: Psychiatry

## 2018-12-04 ENCOUNTER — Encounter: Payer: Self-pay | Admitting: Psychiatry

## 2018-12-04 ENCOUNTER — Other Ambulatory Visit: Payer: Self-pay

## 2018-12-04 VITALS — BP 150/80 | HR 58 | Wt 250.0 lb

## 2018-12-04 DIAGNOSIS — F5105 Insomnia due to other mental disorder: Secondary | ICD-10-CM | POA: Diagnosis not present

## 2018-12-04 DIAGNOSIS — F32A Depression, unspecified: Secondary | ICD-10-CM

## 2018-12-04 DIAGNOSIS — F411 Generalized anxiety disorder: Secondary | ICD-10-CM | POA: Diagnosis not present

## 2018-12-04 DIAGNOSIS — F329 Major depressive disorder, single episode, unspecified: Secondary | ICD-10-CM

## 2018-12-04 NOTE — Progress Notes (Signed)
David [name] [id number] 1945/08/04 73 y.o.  Subjective:   Patient ID:  David [name] is a 73 y.o. (DOB 08/25/1945) male.  Chief Complaint:  Chief Complaint  Patient presents with  . Follow-up    h/o Depression, Anxiety, disorientation    HPI David [name] presents to the office today for follow-up of anxiety and depression. "Things are very frgamented." He reports that he has been having difficulty staying oriented. He reports that he has been using multiple tools to help re-orient himself. He reports that there was recently a family birthday party and he enjoysed this and also realized how much he misses family time. He reports that he has contact with David [name] and their daughter several times a week.   Has some worry about family members' risks for COVID with their jobs reuiring some increase in risk. He reports that he has some anxiety that he has forgotten something or that he needs to be somewhere and then will consult day timer. He reports that he has anxiety upon awakening and wondering if he is supposed to be somewhere or so something. He reports sad mood "most of the time" and attributes this to accepting the changes in his family life. He reports that he has been walking daily, weather permitting. He reports that his energy and motivation have been low. He reports that he is reading frequently. He reports that he is able to concentrate on reading The [employer] Yorker. He reports he has been paying some of his bills. He reports that sleep is "not as much as I would like, but it's deep." Has been falling asleep without difficulty. He reports sleeping through the night. Appetite has been stable. He reports vague, fleeting suicidal thoughts and reports that he does not want to act on these thoughts due to his daughter and grandchildren.  Review of Systems:  Review of Systems  Musculoskeletal: Negative for gait problem.  Neurological: Negative for tremors.  Psychiatric/Behavioral:        Please refer to HPI    Medications: I have reviewed the patient's current medications.  Current Outpatient Medications  Medication Sig Dispense Refill  . aspirin EC 81 MG tablet Take 81 mg by mouth daily.    Marland Kitchen buPROPion (WELLBUTRIN XL) 150 MG 24 hr tablet TAKE 1 TABLET ONCE DAILY. 28 tablet 0  . Cholecalciferol (VITAMIN D3 PO) Take 2,000 Units by mouth daily.    Marland Kitchen donepezil (ARICEPT) 5 MG tablet TAKE ONE TABLET AT BEDTIME. 28 tablet 0  . escitalopram (LEXAPRO) 20 MG tablet TAKE 1 TABLET ONCE DAILY. 28 tablet 0  . LORazepam (ATIVAN) 0.5 MG tablet Take 1 tablet (0.5 mg total) by mouth at bedtime. 28 tablet 0  . memantine (NAMENDA) 10 MG tablet TAKE 1 TABLET BY MOUTH TWICE DAILY. 56 tablet 0  . OLANZapine (ZYPREXA) 5 MG tablet TAKE ONE TABLET AT BEDTIME. 28 tablet 0  . simvastatin (ZOCOR) 40 MG tablet Take 40 mg by mouth every evening.    . Tamsulosin HCl (FLOMAX) 0.4 MG CAPS Take 1 mg by mouth daily.    . vitamin B-12 (CYANOCOBALAMIN) 1000 MCG tablet Take 1,000 mcg by mouth daily.     No current facility-administered medications for this visit.     Medication Side Effects: None  Allergies:  Allergies  Allergen Reactions  . Abilify [Aripiprazole] Other (See Comments)    Short term memory loss  . Lamictal [Lamotrigine]     Increased agitation  . Wellbutrin [Bupropion] Anxiety  Patient does not recall allergy type Anxiety, confusion per Dr Edwin Dada note    Past Medical History:  Diagnosis Date  . Anxiety   . BPH (benign prostatic hyperplasia)   . Dementia ([place])   . Depression   . Hyperlipidemia   . Hypertension   . Kidney stone   . Memory loss   . 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 Sleep apnea    CPAP  . Vitamin D deficiency     Family History  Problem Relation Age of Onset  . Alzheimer's disease Mother   . Depression Mother   . Cancer Father        prostate  . Thyroid disease Father   . Alzheimer's disease Maternal Grandmother   . Heart  disease Maternal Grandfather   . Stroke Paternal Grandmother   . Stroke Paternal Grandfather   . Alcoholism Brother   . Autism Daughter   . Autism Maternal Uncle     Social History   Socioeconomic History  . Marital status: Legally Separated    Spouse name: David [name]  . Number of children: 2  . Years of education: 56  . 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 Financial resource strain: Not on file  . Food insecurity    Worry: Not on file    Inability: Not on file  . Transportation needs    Medical: Not on file    Non-medical: Not on file  Tobacco Use  . Smoking status: Never Smoker  . Smokeless tobacco: Never Used  Substance and Sexual Activity  . Alcohol use: Yes    Alcohol/week: 1.0 - 2.0 standard drinks    Types: 1 - 2 Shots of liquor per week    Comment: 1-2 shots 4-5 x week  . Drug use: Yes    Types: Marijuana    Comment: Occasional  . Sexual activity: Never  Lifestyle  . Physical activity    Days per week: Not on file    Minutes per session: Not on file  . Stress: Not on file  Relationships  . Social Herbalist on phone: Not on file    Gets together: Not on file    Attends religious service: Not on file    Active member of club or organization: Not on file    Attends meetings of clubs or organizations: Not on file    Relationship status: Not on file  . Intimate partner violence    Fear of current or ex partner: Not on file    Emotionally abused: Not on file    Physically abused: Not on file    Forced sexual activity: Not on file  Other Topics Concern  . Not on file  Social History Narrative   Lives alone retired.  Education: BS, MS Phd chemist.  Separated 8 yrs a/o spring 2020.  Adult children son, out of home, and daughter, adult with autistic spectrum, lives with estranged wife.  2 gks.  Mother died at age 42, of COVID, in the [place] outbreak -- emotionally destabilizing  event late May/early June.        Cokes 0-3 daily as reported by nursing Sept 2019.  Gets medications via blister pack prepared by pharmacy a/o spring 2020, with generally good compliance but some mixing up of times and dates.    Past Medical History, Surgical history, Social history, and Family history were reviewed  and updated as appropriate.   Please see review of systems for further details on the patient's review from today.   Objective:   Physical Exam:  BP (!) 150/80   Pulse (!) 58   Wt 250 lb (113.4 kg)   BMI 32.10 kg/m   Physical Exam Constitutional:      General: He is not in acute distress.    Appearance: He is well-developed.  Musculoskeletal:        General: No deformity.  Neurological:     Mental Status: He is alert and oriented to person, place, and time.     Coordination: Coordination normal.  Psychiatric:        Attention and Perception: Attention and perception normal. He does not perceive auditory or visual hallucinations.        Mood and Affect: Mood normal. Mood is not anxious or depressed. Affect is not labile, blunt, angry or inappropriate.        Speech: Speech normal.        Behavior: Behavior normal.        Thought Content: Thought content normal. Thought content is not paranoid or delusional. Thought content does not include homicidal or suicidal ideation. Thought content does not include homicidal or suicidal plan.        Cognition and Memory: Memory is impaired. He exhibits impaired recent memory.        Judgment: Judgment normal.     Comments: Insight intact. No delusions.      Lab Review:     Component Value Date/Time   NA 139 06/23/2018 1834   K 4.2 06/23/2018 1834   CL 106 06/23/2018 1834   CO2 24 06/23/2018 1834   GLUCOSE 89 06/23/2018 1834   BUN 17 06/23/2018 1834   CREATININE 1.32 (H) 06/23/2018 1834   CALCIUM 9.2 06/23/2018 1834   PROT 6.4 (L) 06/23/2018 1834   ALBUMIN 4.1 06/23/2018 1834   AST 17 06/23/2018 1834   ALT 14  06/23/2018 1834   ALKPHOS 46 06/23/2018 1834   BILITOT 0.9 06/23/2018 1834   GFRNONAA 54 (L) 06/23/2018 1834   GFRAA >60 06/23/2018 1834       Component Value Date/Time   WBC 11.3 (H) 06/23/2018 1834   RBC 4.42 06/23/2018 1834   HGB 13.6 06/23/2018 1834   HCT 42.3 06/23/2018 1834   PLT 258 06/23/2018 1834   MCV 95.7 06/23/2018 1834   MCH 30.8 06/23/2018 1834   MCHC 32.2 06/23/2018 1834   RDW 12.4 06/23/2018 1834    No results found for: POCLITH, LITHIUM   No results found for: PHENYTOIN, PHENOBARB, VALPROATE, CBMZ   .res Assessment: Plan:   Recommend continuing current plan of care since there is been an overall improvement in patient's mood, anxiety, and cognition in the last several months.  Discussed with patient that this office would be in touch with his pharmacy to determine best plan in terms of medication refills and continuing monthly Dosepaks. Recommend continuing psychotherapy with [name], PhD. Patient to follow-up in 4 weeks or sooner if clinically indicated. Patient advised to contact office with any questions, adverse effects, or acute worsening in signs and symptoms.  Alem was seen today for follow-up.  Diagnoses and all orders for this visit:  Generalized anxiety disorder  Depression, unspecified depression type  Insomnia due to mental condition     Please see After Visit Summary for patient specific instructions.  Future Appointments  Date Time Provider [place]  12/07/2018 11:00 AM [place],  Herbie Baltimore, PhD CP-CP None  01/01/2019  9:30 AM [name], PMHNP CP-CP None    No orders of the defined types were placed in this encounter.   -------------------------------

## 2018-12-04 NOTE — Progress Notes (Signed)
   12/04/18 1027  Facial and Oral Movements  Muscles of Facial Expression 0  Lips and Perioral Area 0  Jaw 0  Tongue 0  Extremity Movements  Upper (arms, wrists, hands, fingers) 0  Lower (legs, knees, ankles, toes) 0  Trunk Movements  Neck, shoulders, hips 0  Overall Severity  Severity of abnormal movements (highest score from questions above) 0  Incapacitation due to abnormal movements 0  Patient's awareness of abnormal movements (rate only patient's report) 0  AIMS Total Score  AIMS Total Score 0

## 2018-12-04 NOTE — Telephone Encounter (Signed)
These came through today with [facility]

## 2018-12-07 ENCOUNTER — Other Ambulatory Visit: Payer: Self-pay

## 2018-12-07 ENCOUNTER — Ambulatory Visit (INDEPENDENT_AMBULATORY_CARE_PROVIDER_SITE_OTHER): Payer: Medicare Other | Admitting: Psychiatry

## 2018-12-07 DIAGNOSIS — E559 Vitamin D deficiency, unspecified: Secondary | ICD-10-CM

## 2018-12-07 DIAGNOSIS — E538 Deficiency of other specified B group vitamins: Secondary | ICD-10-CM

## 2018-12-07 DIAGNOSIS — R69 Illness, unspecified: Secondary | ICD-10-CM

## 2018-12-07 DIAGNOSIS — F330 Major depressive disorder, recurrent, mild: Secondary | ICD-10-CM

## 2018-12-07 DIAGNOSIS — G3184 Mild cognitive impairment, so stated: Secondary | ICD-10-CM

## 2018-12-07 DIAGNOSIS — F411 Generalized anxiety disorder: Secondary | ICD-10-CM | POA: Diagnosis not present

## 2018-12-07 NOTE — Progress Notes (Signed)
Psychotherapy Progress Note [facility] [name], PhD LP  Patient ID: David [name]     MRN: [id number]     Therapy format: Individual psychotherapy Date: 12/07/2018     Start: 11:10a Stop: 12:00n Time Spent: 50 min Location: in-person   Session narrative (presenting needs, interim history, self-report of stressors and symptoms, applications of prior therapy, status changes, and interventions made in session) Buoyed seeing his son Bland Span work hard, succeed, raise family, and seeing grandchildren.  Still occupied with how [name] and rachel can butt heads, though he sees relatively little of it, and he knows the care system at their house works reliably.  Has enjoyed opera and [employer] video with [name], but digital recordings (made from [employer]) have expired.  Briefly brainstormed ways to refresh access to these for the sake of daughter's enjoyment.  Personally, getting out for walks successfully, losing weight modestly, getting a good sweat.  Has a very pleasant path near his home, used regularly.  Aware walking is also a strong recommendation for emotional and cognitive health.  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  Despite indications of persistent depression in Tuesday's med check note, PT affirms he is satisfied with family life at this point, including the access and exposure he has to children and grandchildren.  No spontaneous complaints about working relationships, loss of perceived freedom, suspicion of ex-wife's motives.  No dramatic longings or agonizing, expressed or suggested, for daughter's perceived welfare, return of the marriage, reunion with his parents, or the need to make them proud of him.  Rechecked his desire for more socialization, still want.  No memory of last week's referral to [employer], which was written down for him.  Repeated referral today, on bright sticky note for his daytimer.  Re. medication regimen, no complaints, ill effects, or wishes for further effectiveness.  Discussed schedule and outlook -- currently scheduled only with prescriber in 4 wks.  Agreeable to 2-week followup, 1st [place] opening in 2.5 wks.  PT and [name] coordinate schedules on their own, no need to establish duplicate communication, though she remains authorized to inquire with office.  Therapeutic modalities: Solution-Oriented/Positive Psychology and Ego-Supportive  Mental Status/Observations:  Appearance:   Casual     Behavior:  Appropriate  Motor:  Normal  Speech/Language:   Clear and Coherent, very minor repetitiveness  Affect:  Appropriate and pleasant  Mood:  euthymic  Thought process:  normal and mildly repetitive   Thought content:    WNL  Sensory/Perceptual disturbances:    WNL  Orientation:  grossly intact  Attention:  Good  Concentration:  Good  Memory:  intact exc. last wk's referral info (evidence of frayed organization)  Insight:    Good  Judgment:   Good  Impulse Control:  Good   Risk Assessment: Danger to Self: No Self-injurious Behavior: No Danger to Others: No Physical Aggression / Violence: No Duty to Warn: No Access to Firearms a concern: No  Assessment of progress:  progressing  Diagnosis:   ICD-10-CM   1. Generalized anxiety disorder  F41.1   2. 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 Mild cognitive impairment  G31.84   4. B12 deficiency  E53.8   5. Vitamin D deficiency  E55.9   6. 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 Continue family involvement, present activities, and walking regimen as at present for mood and wellbeing . Reach out to [employer] as motivated to make new social  connection(s) . Continue to work [employer] with [name] on scheduling, decision-making, and life administration . Other recommendations/advice as noted above . Assess mood more explicitly next time, clarify varying reports to (male) prescriber and (male) therapist. . Continue to utilize previously learned skills ad lib . Maintain medication as prescribed and work faithfully with relevant prescriber(s) if any changes are desired or seem indicated . Call the clinic on-call service, present to ER, or call 911 if any life-threatening psychiatric crisis Return in about 2 weeks (around 12/21/2018) for 1st opening showing is Monday 9/14 at 4pm -- pencilled in.  Blanchie Serve, PhD [name], PhD LP Clinical Psychologist, [facility] [address], [place], [place] [address] [phone]

## 2018-12-24 ENCOUNTER — Ambulatory Visit (INDEPENDENT_AMBULATORY_CARE_PROVIDER_SITE_OTHER): Payer: Medicare Other | Admitting: Psychiatry

## 2018-12-24 ENCOUNTER — Other Ambulatory Visit: Payer: Self-pay

## 2018-12-24 DIAGNOSIS — F411 Generalized anxiety disorder: Secondary | ICD-10-CM

## 2018-12-24 DIAGNOSIS — F330 Major depressive disorder, recurrent, mild: Secondary | ICD-10-CM

## 2018-12-24 DIAGNOSIS — Z634 Disappearance and death of family member: Secondary | ICD-10-CM | POA: Diagnosis not present

## 2018-12-24 DIAGNOSIS — G3184 Mild cognitive impairment, so stated: Secondary | ICD-10-CM | POA: Diagnosis not present

## 2018-12-24 DIAGNOSIS — Z639 Problem related to primary support group, unspecified: Secondary | ICD-10-CM

## 2018-12-24 NOTE — Progress Notes (Signed)
Psychotherapy Progress Note [facility] David Moore, PhD LP  Patient ID: David [name]     MRN: [id number]     Therapy format: Individual psychotherapy Date: 12/24/2018     Start: 4:18p Stop: 5:08p Time Spent: 50 min Location: in-person   Session narrative (presenting needs, interim history, self-report of stressors and symptoms, applications of prior therapy, status changes, and interventions made in session) Initial small talk noted nonsequitur (reference to sick building syndrome, when topic was COVID).    Drawn to talk about mother.  "Lingering core of guilt."  Nonspecific when probed, but just feels maybe having let her down.  Reframed as empathy more than actual guilt, mostly the understandable wish he could have done more in care & concern for her.   Lifestyle efforts vs. depression -- sees a good "groove" with exercise, holding down weight, [facility], continues to enjoy walking trail (David [name]).  Rec center nearby has plaque with PT's name on it for volleyball championship in more active years.  Maintains regular medication, by self-report.  Decent touch with extended family members.  Has reduced volume of papers laying around, not any particular anxiety in ridding himself of them.  Misses more time with David [name].  Run down some by David [name]'s work schedule reducing reliable time to see her.  Some satisfaction with son David [name] success, reiterates pride in him for getting more responsibility and being entrusted but also persistent worry over him having to be exposed to what Pt believes are active pandemic areas.  Still, feels son has achieved an independence he would hope for him.  Independence and confidence have been important values for PT.  He was left alone a lot as young as 12/13, with his very private grandmother David [name]") supervising.  She was animated, frequently agitated, high expectations without so much clarity, spoke mainly in [place].     Not as interested in a [employer] contact at this point.  Is looking into meditation practice hosted by his friend at [employer].  Only obstacle is getting himself in gear to get there when scheduled.  Has befriended fairly new neighbors, a young couple upstairs.    This time of year becomes "spooky" with traditional Jewish holidays.  David [name] begin this Friday at [place] Sunday 9/27.  Less inclined to corporate worship, but does care to observe the spirit of the holiday, mostly in family gathering, wants to contribute to the preparations, possibly cook.  Therapeutic modalities: Cognitive Behavioral Therapy and Ego-Supportive  Mental Status/Observations:  Appearance:   Casual     Behavior:  Appropriate  Motor:  Normal  Speech/Language:   Clear and Coherent and occasional nonsequitur  Affect:  Appropriate  Mood:  wistful  Thought process:  grossly intact  Thought content:    WNL  Sensory/Perceptual disturbances:    WNL  Orientation:  grossly intact  Attention:  Good  Concentration:  Fair  Memory:  WNL  Insight:    Fair  Judgment:   Fair  Impulse Control:  Good   Risk Assessment: Danger to Self: No Self-injurious Behavior: No Danger to Others: No Physical Aggression / Violence: No Duty to Warn: No Access to Firearms a concern: No  Assessment of progress:  stabilized  Diagnosis:   ICD-10-CM   1. MDD (major depressive disorder), recurrent episode, mild ([place])  F33.0   2. Generalized anxiety disorder  F41.1   3. Mild cognitive impairment  G31.84   4. Bereavement  Z63.4   5. Family relationship problem  Z63.9     Plan:  . Arrange as able to contribute to [employer], enjoy family as available . Option to make inventory, per [employer], of perceived wrongs to David [name] for . Maintain health habits and personal organization, ask help if notices them failing  . Consider whether should reinstate neurologist now that he has recovered from  condition when neurologist signed off service . Other recommendations/advice as noted above . Med check next week.  No suspicions of need to adjust, but monitor for need to further treat dementia sxs  . Continue to utilize previously learned skills ad lib . Maintain medication as prescribed and work faithfully with relevant prescriber(s) if any changes are desired or seem indicated . Call the clinic on-call service, present to ER, or call 911 if any life-threatening psychiatric crisis Return in about 2 weeks (around 01/07/2019).  David Serve, PhD David Moore, PhD LP Clinical Psychologist, [facility] [address] [place], [place] [address] [phone]

## 2019-01-01 ENCOUNTER — Ambulatory Visit: Payer: Medicare Other | Admitting: Psychiatry

## 2019-01-02 ENCOUNTER — Other Ambulatory Visit: Payer: Self-pay | Admitting: Psychiatry

## 2019-01-02 DIAGNOSIS — F411 Generalized anxiety disorder: Secondary | ICD-10-CM

## 2019-01-02 DIAGNOSIS — F330 Major depressive disorder, recurrent, mild: Secondary | ICD-10-CM

## 2019-01-02 NOTE — Telephone Encounter (Signed)
appt 01/14/2019

## 2019-01-04 ENCOUNTER — Telehealth: Payer: Self-pay | Admitting: Psychiatry

## 2019-01-04 NOTE — Telephone Encounter (Signed)
David [name] left v-mail asking David [name] to return call. Need to report some things she has noticed. [phone] missed appt  9/22, came in late. Rescheduled for 10/5 @ 11:45
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
## 2019-01-08 ENCOUNTER — Ambulatory Visit (INDEPENDENT_AMBULATORY_CARE_PROVIDER_SITE_OTHER): Payer: Medicare Other | Admitting: Psychiatry

## 2019-01-08 ENCOUNTER — Other Ambulatory Visit: Payer: Self-pay

## 2019-01-08 DIAGNOSIS — F330 Major depressive disorder, recurrent, mild: Secondary | ICD-10-CM

## 2019-01-08 DIAGNOSIS — G3184 Mild cognitive impairment, so stated: Secondary | ICD-10-CM | POA: Diagnosis not present

## 2019-01-08 DIAGNOSIS — F411 Generalized anxiety disorder: Secondary | ICD-10-CM | POA: Diagnosis not present

## 2019-01-08 DIAGNOSIS — E538 Deficiency of other specified B group vitamins: Secondary | ICD-10-CM | POA: Diagnosis not present

## 2019-01-08 DIAGNOSIS — Z630 Problems in relationship with spouse or partner: Secondary | ICD-10-CM

## 2019-01-08 DIAGNOSIS — E559 Vitamin D deficiency, unspecified: Secondary | ICD-10-CM
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
## 2019-01-10 ENCOUNTER — Ambulatory Visit: Payer: Medicare Other | Admitting: Psychiatry

## 2019-01-14 ENCOUNTER — Other Ambulatory Visit: Payer: Self-pay

## 2019-01-14 ENCOUNTER — Ambulatory Visit (INDEPENDENT_AMBULATORY_CARE_PROVIDER_SITE_OTHER): Payer: Medicare Other | Admitting: Psychiatry

## 2019-01-14 ENCOUNTER — Encounter: Payer: Self-pay | Admitting: Psychiatry

## 2019-01-14 VITALS — BP 145/72 | HR 60 | Wt 255.0 lb

## 2019-01-14 DIAGNOSIS — F330 Major depressive disorder, recurrent, mild: Secondary | ICD-10-CM

## 2019-01-14 DIAGNOSIS — F5105 Insomnia due to other mental disorder: Secondary | ICD-10-CM | POA: Diagnosis not present

## 2019-01-14 DIAGNOSIS — F411 Generalized anxiety disorder: Secondary | ICD-10-CM

## 2019-01-14 NOTE — Progress Notes (Signed)
David [name] [id number] 1946-01-15 73 y.o.  Subjective:   Patient ID:  David [name] is a 73 y.o. (DOB 25-Jul-1945) male.  Chief Complaint:  Chief Complaint  Patient presents with  . Follow-up    h/o depression, anxiety, sleep disturbance, and paranoia    HPI David [name] presents to the office today for follow-up of depression and anxiety. He reports that he feels "pretty level... placid." He reports that there was some recent discord involving the mother of son's children and pt's estranged wife. He reports that conflict has caused him some anxiety and reports that there has been some recent return to the status quo. He reports anxiety has been manageable. He reports some sadness re: conflict between some family members. Otherwise denies significant depressed mood. Describes sleep as "ok." He reports that he has occ difficulty falling asleep. Reports that he is able to stay asleep after falling asleep. He denies sleeping excessively or altered sleep wake cycle. He reports that he has been exercising and thinks that this is helpful for his energy and motivation. He reports that he will go for a walk when feeling anxious. He describes appetite has "too good." He reports that his concentration has been good. He reports that he has been enjoying reading "[employer] He notices some memory difficulties and reports that he continues to fear that he has lost track of some where he is supposed to be or something that he is supposed to do.   Denies SI.   Past Psychiatric Medication Trials: Rexulti- helpful for mood. Possible akathisia Sertraline- has been helpful for mood and anxiety. Taken since 2006. Has taken up to 300 mg po qd Effexor Paxil Lexapro- Ineffective for anxiety Cymbalta- was effective and then stopped Prozac Abilify-"more confused" and possible paradoxical reaction. Rexulti- Possible akathisia at higher dose Saphris- Minimal effect at 2.5 mg. Buspar Propranolol-  Not effective Lamictal- increased agitation Wellbutrin- Adverse effects, increased agitation Alprazolam- Effective Ativan- Ineffective Valium- Calming effect Ambien- Parasomnia Remeron- May have caused worsening confusion.   Review of Systems:  Review of Systems  Musculoskeletal: Negative for gait problem.  Neurological: Negative for tremors.  Psychiatric/Behavioral:       Please refer to HPI    Medications: I have reviewed the patient's current medications.  Current Outpatient Medications  Medication Sig Dispense Refill  . aspirin EC 81 MG tablet Take 81 mg by mouth daily.    Marland Kitchen buPROPion (WELLBUTRIN XL) 150 MG 24 hr tablet TAKE 1 TABLET ONCE DAILY. 28 tablet 0  . Cholecalciferol (VITAMIN D3 PO) Take 2,000 Units by mouth daily.    Marland Kitchen donepezil (ARICEPT) 5 MG tablet TAKE ONE TABLET AT BEDTIME. 28 tablet 0  . escitalopram (LEXAPRO) 20 MG tablet TAKE 1 TABLET ONCE DAILY. 28 tablet 0  . LORazepam (ATIVAN) 0.5 MG tablet TAKE ONE TABLET AT BEDTIME. 28 tablet 0  . memantine (NAMENDA) 10 MG tablet TAKE 1 TABLET BY MOUTH TWICE DAILY. 56 tablet 0  . OLANZapine (ZYPREXA) 5 MG tablet TAKE ONE TABLET AT BEDTIME. 28 tablet 0  . simvastatin (ZOCOR) 40 MG tablet Take 40 mg by mouth every evening.    . Tamsulosin HCl (FLOMAX) 0.4 MG CAPS Take 1 mg by mouth daily.    . vitamin B-12 (CYANOCOBALAMIN) 1000 MCG tablet Take 1,000 mcg by mouth daily.     No current facility-administered medications for this visit.     Medication Side Effects: None  Allergies:  Allergies  Allergen Reactions  . Abilify [  Aripiprazole] Other (See Comments)    Short term memory loss  . Lamictal [Lamotrigine]     Increased agitation  . Wellbutrin [Bupropion] Anxiety    Patient does not recall allergy type Anxiety, confusion per Dr Edwin Dada note    Past Medical History:  Diagnosis Date  . Anxiety   . BPH (benign prostatic hyperplasia)   . Dementia ([place])   . Depression   . Hyperlipidemia   . Hypertension    . Kidney stone   . Memory loss   . 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 Sleep apnea    CPAP  . Vitamin D deficiency     Family History  Problem Relation Age of Onset  . Alzheimer's disease Mother   . Depression Mother   . Cancer Father        prostate  . Thyroid disease Father   . Alzheimer's disease Maternal Grandmother   . Heart disease Maternal Grandfather   . Stroke Paternal Grandmother   . Stroke Paternal Grandfather   . Alcoholism Brother   . Autism Daughter   . Autism Maternal Uncle     Social History   Socioeconomic History  . Marital status: Legally Separated    Spouse name: [name]  . Number of children: 2  . Years of education: 38  . 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 Financial resource strain: Not on file  . Food insecurity    Worry: Not on file    Inability: Not on file  . Transportation needs    Medical: Not on file    Non-medical: Not on file  Tobacco Use  . Smoking status: Never Smoker  . Smokeless tobacco: Never Used  Substance and Sexual Activity  . Alcohol use: Yes    Alcohol/week: 1.0 - 2.0 standard drinks    Types: 1 - 2 Shots of liquor per week    Comment: 1-2 shots 4-5 x week  . Drug use: Yes    Types: Marijuana    Comment: Occasional  . Sexual activity: Never  Lifestyle  . Physical activity    Days per week: Not on file    Minutes per session: Not on file  . Stress: Not on file  Relationships  . Social Herbalist on phone: Not on file    Gets together: Not on file    Attends religious service: Not on file    Active member of club or organization: Not on file    Attends meetings of clubs or organizations: Not on file    Relationship status: Not on file  . Intimate partner violence    Fear of current or ex partner: Not on file    Emotionally abused: Not on file    Physically abused: Not on file    Forced sexual activity: Not  on file  Other Topics Concern  . Not on file  Social History Narrative   Lives alone retired.  Education: BS, MS Phd chemist.  Separated 8 yrs a/o spring 2020.  Adult children son, out of home, and daughter, adult with autistic spectrum, lives with estranged wife.  2 gks.  Mother died at age 52, of COVID, in the [place] outbreak -- emotionally destabilizing event late May/early June.        Cokes 0-3 daily as reported by nursing Sept 2019.  Gets medications via blister pack prepared by pharmacy a/o  spring 2020, with generally good compliance but some mixing up of times and dates.    Past Medical History, Surgical history, Social history, and Family history were reviewed and updated as appropriate.   Please see review of systems for further details on the patient's review from today.   Objective:   Physical Exam:  BP (!) 145/72   Pulse 60   Wt 255 lb (115.7 kg)   BMI 32.74 kg/m   Physical Exam Constitutional:      General: He is not in acute distress.    Appearance: He is well-developed.  Musculoskeletal:        General: No deformity.  Neurological:     Mental Status: He is alert and oriented to person, place, and time.     Coordination: Coordination normal.  Psychiatric:        Attention and Perception: Attention and perception normal. He does not perceive auditory or visual hallucinations.        Mood and Affect: Mood normal. Mood is not anxious or depressed. Affect is not labile, blunt, angry or inappropriate.        Speech: Speech normal.        Behavior: Behavior normal.        Thought Content: Thought content normal. Thought content is not paranoid or delusional. Thought content does not include homicidal or suicidal ideation. Thought content does not include homicidal or suicidal plan.        Cognition and Memory: He exhibits impaired recent memory.        Judgment: Judgment normal.     Comments: Insight intact. No delusions.      Lab Review:     Component Value  Date/Time   NA 139 06/23/2018 1834   K 4.2 06/23/2018 1834   CL 106 06/23/2018 1834   CO2 24 06/23/2018 1834   GLUCOSE 89 06/23/2018 1834   BUN 17 06/23/2018 1834   CREATININE 1.32 (H) 06/23/2018 1834   CALCIUM 9.2 06/23/2018 1834   PROT 6.4 (L) 06/23/2018 1834   ALBUMIN 4.1 06/23/2018 1834   AST 17 06/23/2018 1834   ALT 14 06/23/2018 1834   ALKPHOS 46 06/23/2018 1834   BILITOT 0.9 06/23/2018 1834   GFRNONAA 54 (L) 06/23/2018 1834   GFRAA >60 06/23/2018 1834       Component Value Date/Time   WBC 11.3 (H) 06/23/2018 1834   RBC 4.42 06/23/2018 1834   HGB 13.6 06/23/2018 1834   HCT 42.3 06/23/2018 1834   PLT 258 06/23/2018 1834   MCV 95.7 06/23/2018 1834   MCH 30.8 06/23/2018 1834   MCHC 32.2 06/23/2018 1834   RDW 12.4 06/23/2018 1834    No results found for: POCLITH, LITHIUM   No results found for: PHENYTOIN, PHENOBARB, VALPROATE, CBMZ   .res Assessment: Plan:   Continue Wellbutrin XL 150 mg daily for depression. Continue Lexapro 20 mg daily for anxiety and depression. Continue Ativan 0.5 mg at bedtime for anxiety and insomnia. Continue olanzapine 5 mg at bedtime since this has been helpful for psychosis, anxiety, mood, and insomnia. Recommend continuing psychotherapy with Ina Homes, PhD. Patient to follow-up with this provider in 6 to 8 weeks or sooner if clinically indicated. Patient advised to contact office with any questions, adverse effects, or acute worsening in signs and symptoms.  Kaan was seen today for follow-up.  Diagnoses and all orders for this visit:  MDD (major depressive disorder), recurrent episode, mild (HCC)  Generalized anxiety disorder  Insomnia due to mental condition  Please see After Visit Summary for patient specific instructions.  Future Appointments  Date Time Provider [place]  01/21/2019  1:00 PM Blanchie Serve, PhD CP-[facility] None  02/25/2019 11:00 AM [name], PMHNP CP-CP None    No orders of the  defined types were placed in this encounter.   -------------------------------

## 2019-01-14 NOTE — Progress Notes (Signed)
   01/14/19 1210  Facial and Oral Movements  Muscles of Facial Expression 0  Lips and Perioral Area 0  Jaw 0  Tongue 0  Extremity Movements  Upper (arms, wrists, hands, fingers) 0  Lower (legs, knees, ankles, toes) 0  Trunk Movements  Neck, shoulders, hips 0  Overall Severity  Severity of abnormal movements (highest score from questions above) 0  Incapacitation due to abnormal movements 0  Patient's awareness of abnormal movements (rate only patient's report) 0  AIMS Total Score  AIMS Total Score 0
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
## 2019-01-21 ENCOUNTER — Other Ambulatory Visit: Payer: Self-pay

## 2019-01-21 ENCOUNTER — Ambulatory Visit (INDEPENDENT_AMBULATORY_CARE_PROVIDER_SITE_OTHER): Payer: Medicare Other | Admitting: Psychiatry

## 2019-01-21 DIAGNOSIS — F411 Generalized anxiety disorder: Secondary | ICD-10-CM | POA: Diagnosis not present

## 2019-01-21 DIAGNOSIS — F330 Major depressive disorder, recurrent, mild: Secondary | ICD-10-CM

## 2019-01-21 DIAGNOSIS — G3184 Mild cognitive impairment, so stated: Secondary | ICD-10-CM

## 2019-01-21 DIAGNOSIS — Z639 Problem related to primary support group, unspecified: Secondary | ICD-10-CM | POA: Diagnosis not present

## 2019-01-30 ENCOUNTER — Other Ambulatory Visit: Payer: Self-pay | Admitting: Psychiatry

## 2019-01-30 DIAGNOSIS — F330 Major depressive disorder, recurrent, mild: Secondary | ICD-10-CM

## 2019-01-30 DIAGNOSIS — F411 Generalized anxiety disorder: Secondary | ICD-10-CM

## 2019-02-04 ENCOUNTER — Other Ambulatory Visit: Payer: Self-pay | Admitting: Psychiatry

## 2019-02-04 NOTE — Telephone Encounter (Signed)
Last visit 10/05, follow up 6-8 weeks

## 2019-02-07 ENCOUNTER — Encounter: Payer: Self-pay | Admitting: Psychiatry

## 2019-02-07 ENCOUNTER — Ambulatory Visit: Payer: Medicare Other | Admitting: Psychiatry
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
## 2019-02-08 ENCOUNTER — Encounter: Payer: Self-pay | Admitting: Psychiatry

## 2019-02-08 NOTE — Progress Notes (Addendum)
Admin note for no-charge patient contact  Patient ID: Render Marley Shuler  MRN: [id number] DATE: 02/08/2019  Pursuing insurance issue today, TC to [place] at [facility] @ [phone].  Unable to determine why original requests (8/19 and 9/28) were not received here, correct office phone number appeared in [facility] call notes), but she also said the denial has already been overturned on report of not receiving the original inquiry, and claims may proceed normally.  Likelihood of future inquiry as part of normal case management of [facility] Medicare Advantage plan.  For reference --  - PT seen > 1 yr by prescriber in this office - referred to me in May after decompensation living alone (failure to manage meds correctly, distraught/depressed, inability of PT and estranged wife to arrange help of a health system social worker) and the need for intervention with both PT and estranged wife/POA reducing anxiety and irritability between them and to problem-solve supportive services - working diagnoses include GAD, MDD, and MCI w/ suspicion of Alzheimer's - persistent issues in anxiety, dysphoria, loss, and cognitive impairment, improved substantially with hospitalization this summer, but erosion of his routines, increased distress and confusion, and undue stresses within his split family system can all precipitate new decompensation and re-hospitalization - experiencing some inconsistency in remembering and making appointments, depends on estranged wife still to be his POA and prosthetic organizer for financial, home care, and health care affairs, and he is borderline incompetent to manage his afffairs, though no guardianship sought or recommended as yet.  Blanchie Serve, PhD

## 2019-02-11 ENCOUNTER — Other Ambulatory Visit: Payer: Self-pay

## 2019-02-11 ENCOUNTER — Ambulatory Visit (INDEPENDENT_AMBULATORY_CARE_PROVIDER_SITE_OTHER): Payer: Medicare Other | Admitting: Psychiatry

## 2019-02-11 DIAGNOSIS — G4721 Circadian rhythm sleep disorder, delayed sleep phase type: Secondary | ICD-10-CM

## 2019-02-11 DIAGNOSIS — G3184 Mild cognitive impairment, so stated: Secondary | ICD-10-CM | POA: Diagnosis not present

## 2019-02-11 DIAGNOSIS — E559 Vitamin D deficiency, unspecified: Secondary | ICD-10-CM

## 2019-02-11 DIAGNOSIS — Z639 Problem related to primary support group, unspecified: Secondary | ICD-10-CM

## 2019-02-11 DIAGNOSIS — F330 Major depressive disorder, recurrent, mild: Secondary | ICD-10-CM | POA: Diagnosis not present
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
## 2019-02-25 ENCOUNTER — Other Ambulatory Visit: Payer: Self-pay

## 2019-02-25 ENCOUNTER — Ambulatory Visit (INDEPENDENT_AMBULATORY_CARE_PROVIDER_SITE_OTHER): Payer: Medicare Other | Admitting: Psychiatry

## 2019-02-25 ENCOUNTER — Ambulatory Visit: Payer: Medicare Other | Admitting: Psychiatry

## 2019-02-25 ENCOUNTER — Other Ambulatory Visit: Payer: Self-pay | Admitting: Psychiatry

## 2019-02-25 ENCOUNTER — Encounter: Payer: Self-pay | Admitting: Psychiatry

## 2019-02-25 VITALS — Wt 249.0 lb

## 2019-02-25 DIAGNOSIS — F411 Generalized anxiety disorder: Secondary | ICD-10-CM

## 2019-02-25 DIAGNOSIS — F5105 Insomnia due to other mental disorder: Secondary | ICD-10-CM

## 2019-02-25 DIAGNOSIS — F330 Major depressive disorder, recurrent, mild: Secondary | ICD-10-CM

## 2019-02-25 NOTE — Progress Notes (Signed)
   02/25/19 1441  Facial and Oral Movements  Muscles of Facial Expression 0  Lips and Perioral Area 0  Jaw 0  Tongue 0  Extremity Movements  Upper (arms, wrists, hands, fingers) 1  Lower (legs, knees, ankles, toes) 0  Trunk Movements  Neck, shoulders, hips 0  Overall Severity  Severity of abnormal movements (highest score from questions above) 0  Incapacitation due to abnormal movements 0  Patient's awareness of abnormal movements (rate only patient's report) 1  AIMS Total Score  AIMS Total Score 2

## 2019-02-25 NOTE — Telephone Encounter (Signed)
Visit today

## 2019-02-25 NOTE — Progress Notes (Signed)
David [name] [id number] 02/04/1946 73 y.o.  Subjective:   Patient ID:  David [name] is a 73 y.o. (DOB 18-Oct-1945) male.  Chief Complaint:  Chief Complaint  Patient presents with  . Anxiety  . Depression    HPI [name] presents to the office today for follow-up of depression, anxiety, and insomnia.He reports that he is having increased difficulty with short-term memory and this causes increased anxiety. He reports that he has been having difficulty keeping up with time and was 30 minutes late since he thought that his apt was at 11:30 instead of 11. He reports that he tries to use different systems to keep track of appointments. He has some anxiety when he goes to an appointment because he is questioning if he is when and where he is supposed to be. Reports anxiety at times due to wondering if he has forgotten something. He reports that he has the most memory impairment around time management. Occasionally does not remember whether he has eaten. He reports that his father used to place a significant emphasis on punctuality.   Son and his family just moved from an apartment to a house. Pt reports that they are close but he does not yet know how to get there. "I don't trust myself to navigate from point A to point B, and once I get there, getting back to point A."  He reports that his appetite has been increased and has gained some weight. He reports that he typically will eat when he is hungry instead of eating at prescribed times. Prefers to share meals with others as much as possible.   He reports that he has been experiencing frequent anxiety. Reports some anxiety in response to [place] stating that she will through out some of his belongings in their house, and he has reserved a storage unit near his home. He reports experiencing some depression- "I feel like I am losing my grip" with confusing apt times. He reports some sadness in response to accepting that his wife does not  want to reconcile. Continues to enjoy reading. He reports adequate concentration. He reports sleep has been "ok." He reports that he did not sleep as well last night. He reports that he is sleeping "well, but not enough." He reports difficulty "turning off the internal dialogue." Reports that he periodically has to get up and check his calendar to make sure he did not forget anything. Occasionally sleeping late if he has been awake later. Energy and motivation have been low. He has been trying to make sure he is walking daily. Reports he had a few fleeting suicidal thoughts when he has forgotten important things. Denies SI.   Reports that he typically takes his HS meds around 10 pm.  Past Psychiatric Medication Trials: Rexulti- helpful for mood. Possible akathisia Sertraline- has been helpful for mood and anxiety. Taken since 2006. Has taken up to 300 mg po qd Effexor Paxil Lexapro- Ineffective for anxiety Cymbalta- was effective and then stopped Prozac Abilify-"more confused" and possible paradoxical reaction. Rexulti- Possible akathisia at higher dose Saphris- Minimal effect at 2.5 mg. Buspar Propranolol- Not effective Lamictal- increased agitation Wellbutrin- Adverse effects, increased agitation Alprazolam- Effective Ativan- Ineffective Valium- Calming effect Ambien- Parasomnia Remeron- May have caused worsening confusion.   Review of Systems:  Review of Systems  Musculoskeletal: Negative for gait problem.  Neurological: Positive for tremors.  Psychiatric/Behavioral:       Please refer to HPI    Medications: I have reviewed  the patient's current medications.  Current Outpatient Medications  Medication Sig Dispense Refill  . aspirin EC 81 MG tablet Take 81 mg by mouth daily.    Marland Kitchen buPROPion (WELLBUTRIN XL) 150 MG 24 hr tablet TAKE 1 TABLET ONCE DAILY. 28 tablet 0  . Cholecalciferol (VITAMIN D3 PO) Take 2,000 Units by mouth daily.    Marland Kitchen donepezil (ARICEPT) 5 MG tablet  TAKE ONE TABLET AT BEDTIME. 28 tablet 0  . escitalopram (LEXAPRO) 20 MG tablet TAKE 1 TABLET ONCE DAILY. 28 tablet 0  . LORazepam (ATIVAN) 0.5 MG tablet TAKE ONE TABLET AT BEDTIME. 28 tablet 2  . memantine (NAMENDA) 10 MG tablet TAKE 1 TABLET BY MOUTH TWICE DAILY. 56 tablet 0  . OLANZapine (ZYPREXA) 5 MG tablet TAKE ONE TABLET AT BEDTIME. 28 tablet 0  . simvastatin (ZOCOR) 40 MG tablet Take 40 mg by mouth every evening.    . Tamsulosin HCl (FLOMAX) 0.4 MG CAPS Take 1 mg by mouth daily.    . vitamin B-12 (CYANOCOBALAMIN) 1000 MCG tablet Take 1,000 mcg by mouth daily.     No current facility-administered medications for this visit.     Medication Side Effects: None  Allergies:  Allergies  Allergen Reactions  . Abilify [Aripiprazole] Other (See Comments)    Short term memory loss  . Lamictal [Lamotrigine]     Increased agitation  . Wellbutrin [Bupropion] Anxiety    Patient does not recall allergy type Anxiety, confusion per Dr Edwin Dada note    Past Medical History:  Diagnosis Date  . Anxiety   . BPH (benign prostatic hyperplasia)   . Dementia ([place])   . Depression   . Hyperlipidemia   . Hypertension   . Kidney stone   . Memory loss   . 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 Sleep apnea    CPAP  . Vitamin D deficiency     Family History  Problem Relation Age of Onset  . Alzheimer's disease Mother   . Depression Mother   . Cancer Father        prostate  . Thyroid disease Father   . Alzheimer's disease Maternal Grandmother   . Heart disease Maternal Grandfather   . Stroke Paternal Grandmother   . Stroke Paternal Grandfather   . Alcoholism Brother   . Autism Daughter   . Autism Maternal Uncle     Social History   Socioeconomic History  . Marital status: Legally Separated    Spouse name: [name]  . Number of children: 2  . Years of education: 95  . 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 Financial resource strain: Not on file  . Food insecurity    Worry: Not on file    Inability: Not on file  . Transportation needs    Medical: Not on file    Non-medical: Not on file  Tobacco Use  . Smoking status: Never Smoker  . Smokeless tobacco: Never Used  Substance and Sexual Activity  . Alcohol use: Yes    Alcohol/week: 1.0 - 2.0 standard drinks    Types: 1 - 2 Shots of liquor per week    Comment: 1-2 shots 4-5 x week  . Drug use: Yes    Types: Marijuana    Comment: Occasional  . Sexual activity: Never  Lifestyle  . Physical activity    Days per week: Not on file    Minutes per  session: Not on file  . Stress: Not on file  Relationships  . Social Herbalist on phone: Not on file    Gets together: Not on file    Attends religious service: Not on file    Active member of club or organization: Not on file    Attends meetings of clubs or organizations: Not on file    Relationship status: Not on file  . Intimate partner violence    Fear of current or ex partner: Not on file    Emotionally abused: Not on file    Physically abused: Not on file    Forced sexual activity: Not on file  Other Topics Concern  . Not on file  Social History Narrative   Lives alone retired.  Education: BS, MS Phd chemist.  Separated 8 yrs a/o spring 2020.  Adult children son, out of home, and daughter, adult with autistic spectrum, lives with estranged wife.  2 gks.  Mother died at age 2, of COVID, in the [place] outbreak -- emotionally destabilizing event late May/early June.        Cokes 0-3 daily as reported by nursing Sept 2019.  Gets medications via blister pack prepared by pharmacy a/o spring 2020, with generally good compliance but some mixing up of times and dates.    Past Medical History, Surgical history, Social history, and Family history were reviewed and updated as appropriate.   Please see review of systems for further details on the patient's review from today.    Objective:   Physical Exam:  Wt 249 lb (112.9 kg)   BMI 31.97 kg/m   Physical Exam Constitutional:      General: He is not in acute distress.    Appearance: He is well-developed.  Musculoskeletal:        General: No deformity.  Neurological:     Mental Status: He is alert and oriented to person, place, and time.     Coordination: Coordination normal.  Psychiatric:        Attention and Perception: Attention and perception normal. He does not perceive auditory or visual hallucinations.        Mood and Affect: Mood normal. Mood is not anxious or depressed. Affect is not labile, blunt, angry or inappropriate.        Speech: Speech normal.        Behavior: Behavior normal.        Thought Content: Thought content normal. Thought content is not paranoid or delusional. Thought content does not include homicidal or suicidal ideation. Thought content does not include homicidal or suicidal plan.        Cognition and Memory: He exhibits impaired recent memory.        Judgment: Judgment normal.     Comments: Insight intact. No delusions.      Lab Review:     Component Value Date/Time   NA 139 06/23/2018 1834   K 4.2 06/23/2018 1834   CL 106 06/23/2018 1834   CO2 24 06/23/2018 1834   GLUCOSE 89 06/23/2018 1834   BUN 17 06/23/2018 1834   CREATININE 1.32 (H) 06/23/2018 1834   CALCIUM 9.2 06/23/2018 1834   PROT 6.4 (L) 06/23/2018 1834   ALBUMIN 4.1 06/23/2018 1834   AST 17 06/23/2018 1834   ALT 14 06/23/2018 1834   ALKPHOS 46 06/23/2018 1834   BILITOT 0.9 06/23/2018 1834   GFRNONAA 54 (L) 06/23/2018 1834   GFRAA >60 06/23/2018 1834  Component Value Date/Time   WBC 11.3 (H) 06/23/2018 1834   RBC 4.42 06/23/2018 1834   HGB 13.6 06/23/2018 1834   HCT 42.3 06/23/2018 1834   PLT 258 06/23/2018 1834   MCV 95.7 06/23/2018 1834   MCH 30.8 06/23/2018 1834   MCHC 32.2 06/23/2018 1834   RDW 12.4 06/23/2018 1834    No results found for: POCLITH, LITHIUM   No results found  for: PHENYTOIN, PHENOBARB, VALPROATE, CBMZ   .res Assessment: Plan:   Patient reports that he would like to continue current medications without any changes since there has been an overall improvement in his mood and anxiety signs and symptoms and current medications have been better tolerated compared to other medications that he has taken. Continue Wellbutrin XL 150 mg daily for depression. Continue olanzapine 5 mg at bedtime for insomnia, mood, anxiety, and history of psychosis. Continue Lexapro 20 mg daily for depression and anxiety. Continue Aricept 5 mg daily for dementia. Continue Namenda 10 mg twice daily for dementia. Continue lorazepam 0.5 mg at bedtime. Recommend continuing psychotherapy with [name], PhD. Patient to follow-up with this provider in 6 weeks or sooner if clinically indicated. Patient advised to contact office with any questions, adverse effects, or acute worsening in signs and symptoms.  [place] was seen today for anxiety and depression.  Diagnoses and all orders for this visit:  MDD (major depressive disorder), recurrent episode, mild (HCC)  Generalized anxiety disorder  Insomnia due to mental condition     Please see After Visit Summary for patient specific instructions.  Future Appointments  Date Time Provider [place]  03/12/2019  3:00 PM Blanchie Serve, PhD CP-CP None  04/10/2019 11:00 AM [name], PMHNP CP-CP None    No orders of the defined types were placed in this encounter.   -------------------------------

## 2019-03-12 ENCOUNTER — Other Ambulatory Visit: Payer: Self-pay

## 2019-03-12 ENCOUNTER — Ambulatory Visit (INDEPENDENT_AMBULATORY_CARE_PROVIDER_SITE_OTHER): Payer: Medicare Other | Admitting: Psychiatry

## 2019-03-12 DIAGNOSIS — G4721 Circadian rhythm sleep disorder, delayed sleep phase type: Secondary | ICD-10-CM

## 2019-03-12 DIAGNOSIS — F330 Major depressive disorder, recurrent, mild: Secondary | ICD-10-CM | POA: Diagnosis not present

## 2019-03-12 DIAGNOSIS — G3184 Mild cognitive impairment, so stated: Secondary | ICD-10-CM | POA: Diagnosis not present

## 2019-03-12 DIAGNOSIS — E538 Deficiency of other specified B group vitamins: Secondary | ICD-10-CM

## 2019-03-12 DIAGNOSIS — E559 Vitamin D deficiency, unspecified: Secondary | ICD-10-CM

## 2019-03-12 DIAGNOSIS — F411 Generalized anxiety disorder: Secondary | ICD-10-CM | POA: Diagnosis not present

## 2019-03-12 NOTE — Progress Notes (Signed)
Psychotherapy Progress Note [name] David Moore, PhD LP  Patient ID: David [name]     MRN: [id number]     Therapy format: Individual psychotherapy Date: 03/12/2019     Start: 3:28p Stop: 4:13p Time Spent: 45 min Location: In-person   Session narrative (presenting needs, interim history, self-report of stressors and symptoms, applications of prior therapy, status changes, and interventions made in session) David [name] is working steadily, he and David [name] are buying first house, and PT has offered some modest financial assistance.  PT would be happy to provide logistical support, e.g., ride to the doctor.  Trying to "repress my anxieties" about how all that will work.  Feels the grandkids are adapting OK.  Meanwhile, irreconcilable rift between [place] and David [name].  Asserts that David [name] and David [name] continue to butt heads, not so much in his presence.  PT on call, often enough, to be available for transportation, companionship, occasional help in the house.  Seems to feel basically useful, no complaints.  Enjoyed working David [name], as usual.  David [name] had birthday a week and a half ago, "an emotional one" (73yo).    Got walking habit going again, est 3-4 times a week, hoping to make daily.  Reading the [name], among other things David for the culture and for the mental stimulation ("Sends me to the dictionary").  Keeps some touch with relatives, e.g., David [name] in [place].     Sleep habit is pretty consistently up 10am, usually within half-hour variance.  Naturally wakes up, but has alarm set as a backup.  Denies further missed appts for sleeping in, nor other schedule snafus with family.  Some naps and nods during the day.     Discussed current stress outlook, PT speaking in some generalities.  Daytimer is in order, manageable, next appt with David [name] accurately recorded.  No sense of memory lapses, but astutely jokes about whether he would remember that.    [place] 29/30 today,  with 2/3 at 1-minute recall.  With no input or requests from David [name], figure on seeing PT Q 1 mo for maintenance support, stress management service, and monitoring mental status, signs of variations or progression in dementia.  Does not appear to require neurologist f/u at this time.    Personal goals to create some time with grandchildren (implication he will approach Bland [name] or David [name] about scheduling) and to try to move wake time to 9am, keeping midnight as target bedtime.  Continuing authorization to coordinate with David [name].  Therapeutic modalities: Cognitive Behavioral Therapy and Solution-Oriented/Positive Psychology  Mental Status/Observations:  Appearance:   Casual     Behavior:  Appropriate  Motor:  Normal  Speech/Language:   Clear and Coherent  Affect:  Appropriate  Mood:  euthymic  Thought process:  normal; mildly repetitive  Thought content:    WNL  Sensory/Perceptual disturbances:    WNL  Orientation:  Fully oriented  Attention:  Good  Concentration:  Good  Memory:  grossly intact  Insight:    Good  Judgment:   Good  Impulse Control:  Good   Risk Assessment: Danger to Self: No Self-injurious Behavior: No Danger to Others: No Physical Aggression / Violence: No Duty to Warn: No Access to Firearms a concern: No  Assessment of progress:  progressing  Diagnosis:   ICD-10-CM   1. Generalized anxiety disorder  F41.1   2. Mild cognitive impairment  G31.84   3. 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 Vitamin D deficiency  E55.9   6. B12 deficiency  E53.8     Plan:  . Cont q 1 mo for monitoring and support . PT will try to advance bedtime to midnight, wake time to 9am . Continue walking for health and cognitive benefit . Cont authorization to coordinate freely with David [name] . Other recommendations/advice as noted above . Continue to utilize previously learned skills  ad lib . Maintain medication as prescribed and work faithfully with relevant prescriber(s) if any changes are desired or seem indicated . Call the clinic on-call service, present to ER, or call 911 if any life-threatening psychiatric crisis Return in about 1 month (around 04/12/2019). Current [facility] appointments: Future Appointments  Date Time Provider Olivia  04/10/2019 11:00 AM [name], PMHNP CP-CP None   Blanchie Serve, PhD David Moore, PhD LP Clinical Psychologist, [facility], [facility], [name] [address] [place], [place] [address] [phone]

## 2019-04-01 ENCOUNTER — Other Ambulatory Visit: Payer: Self-pay | Admitting: Psychiatry

## 2019-04-01 DIAGNOSIS — F330 Major depressive disorder, recurrent, mild: Secondary | ICD-10-CM

## 2019-04-01 DIAGNOSIS — F411 Generalized anxiety disorder: Secondary | ICD-10-CM

## 2019-04-10 ENCOUNTER — Encounter: Payer: Self-pay | Admitting: Psychiatry

## 2019-04-10 ENCOUNTER — Ambulatory Visit (INDEPENDENT_AMBULATORY_CARE_PROVIDER_SITE_OTHER): Payer: Medicare Other | Admitting: Psychiatry

## 2019-04-10 ENCOUNTER — Other Ambulatory Visit: Payer: Self-pay

## 2019-04-10 VITALS — BP 128/70 | HR 65

## 2019-04-10 DIAGNOSIS — F5105 Insomnia due to other mental disorder: Secondary | ICD-10-CM

## 2019-04-10 DIAGNOSIS — F411 Generalized anxiety disorder: Secondary | ICD-10-CM

## 2019-04-10 DIAGNOSIS — F330 Major depressive disorder, recurrent, mild: Secondary | ICD-10-CM | POA: Diagnosis not present

## 2019-04-10 NOTE — Progress Notes (Signed)
   04/10/19 1132  Facial and Oral Movements  Muscles of Facial Expression 0  Lips and Perioral Area 0  Jaw 0  Tongue 0  Extremity Movements  Upper (arms, wrists, hands, fingers) 0  Lower (legs, knees, ankles, toes) 0  Trunk Movements  Neck, shoulders, hips 0  Overall Severity  Severity of abnormal movements (highest score from questions above) 0  Incapacitation due to abnormal movements 0  Patient's awareness of abnormal movements (rate only patient's report) 0  Dental Status  Current problems with teeth and/or dentures? No  Does patient usually wear dentures? No  AIMS Total Score  AIMS Total Score 0

## 2019-04-10 NOTE — Progress Notes (Signed)
David [name] [id number] Nov 15, 1945 73 y.o.  Subjective:   Patient ID:  David [name] is a 73 y.o. (DOB 1946/02/09) male.  Chief Complaint:  Chief Complaint  Patient presents with  . Anxiety    HPI David [name] presents to the office today for follow-up of depression, anxiety, insomnia, and h/o psychosis. He reports that he continues to experience some stress in response to conflict between the mother and grandmother of his grandchildren. He reports some worry about the impact this could have on the rest of the family. He repots some rumination about this situation "and it's exhausting." He reports some worry about son's wellbeing and potential exposure risk with his work. Pt reports that his anxiety is episodic. He reports some sadness in response to family conflict- "it doesn't look like it is going to improve." He reports that he also feels disappointed that his wife continues to tell him that she is not interested in reconciliation. He reports that he had difficulty sleeping last night and this has been happening periodically due to anxious thoughts. He reports that he has been trying to limit food intake and have one significant meal daily. Reports that energy is adequate once he gets up and starts moving. Continues to go for walks periodically, about 3-4 times a week. Denies SI.   He is able to periodically see his grandchildren.    AIMS     Office Visit from 04/10/2019 in [place] Visit from 02/25/2019 in [place] Visit from 01/14/2019 in [place] Visit from 12/04/2018 in [place] Visit from 11/05/2018 in [place] Total Score  0  2  0  0  0    Mini-Mental     Office Visit from 06/19/2018 in [place] Visit from 01/01/2018 in [facility] Office Visit from 11/14/2016 in [facility] Office  Visit from 11/12/2015 in [facility] Office Visit from 05/13/2015 in [facility]  Total Score (max 30 points )  23  25  28  30  30     PHQ2-9     Office Visit from 12/14/2017 in [facility] from 05/25/2015 in [facility]  PHQ-2 Total Score  5  4  PHQ-9 Total Score  15  14       Review of Systems:  Review of Systems  Medications: I have reviewed the patient's current medications.  Current Outpatient Medications  Medication Sig Dispense Refill  . aspirin EC 81 MG tablet Take 81 mg by mouth daily.    Marland Kitchen buPROPion (WELLBUTRIN XL) 150 MG 24 hr tablet TAKE 1 TABLET ONCE DAILY. 28 tablet 0  . Cholecalciferol (VITAMIN D3 PO) Take 2,000 Units by mouth daily.    Marland Kitchen donepezil (ARICEPT) 5 MG tablet TAKE ONE TABLET AT BEDTIME. 28 tablet 0  . escitalopram (LEXAPRO) 20 MG tablet TAKE 1 TABLET ONCE DAILY. 28 tablet 0  . LORazepam (ATIVAN) 0.5 MG tablet TAKE ONE TABLET AT BEDTIME. 28 tablet 2  . memantine (NAMENDA) 10 MG tablet TAKE 1 TABLET BY MOUTH TWICE DAILY. 56 tablet 0  . OLANZapine (ZYPREXA) 5 MG tablet TAKE ONE TABLET AT BEDTIME. 28 tablet 0  . simvastatin (ZOCOR) 40 MG tablet Take 40 mg by mouth every evening.    . Tamsulosin HCl (FLOMAX) 0.4 MG CAPS Take 1 mg by mouth daily.    . vitamin B-12 (CYANOCOBALAMIN) 1000 MCG tablet  Take 1,000 mcg by mouth daily.     No current facility-administered medications for this visit.    Medication Side Effects: None  Allergies:  Allergies  Allergen Reactions  . Abilify [Aripiprazole] Other (See Comments)    Short term memory loss  . Lamictal [Lamotrigine]     Increased agitation  . Wellbutrin [Bupropion] Anxiety    Patient does not recall allergy type Anxiety, confusion per Dr Edwin Dada note    Past Medical History:  Diagnosis Date  . Anxiety   . BPH (benign prostatic hyperplasia)   . Dementia ([place])   . Depression   . Hyperlipidemia   . Hypertension   . Kidney stone    . Memory loss   . 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 Sleep apnea    CPAP  . Vitamin D deficiency     Family History  Problem Relation Age of Onset  . Alzheimer's disease Mother   . Depression Mother   . Cancer Father        prostate  . Thyroid disease Father   . Alzheimer's disease Maternal Grandmother   . Heart disease Maternal Grandfather   . Stroke Paternal Grandmother   . Stroke Paternal Grandfather   . Alcoholism Brother   . Autism Daughter   . Autism Maternal Uncle     Social History   Socioeconomic History  . Marital status: Legally Separated    Spouse name: [name]  . Number of children: 2  . Years of education: 65  . 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 Smoking status: Never Smoker  . Smokeless tobacco: Never Used  Substance and Sexual Activity  . Alcohol use: Yes    Alcohol/week: 1.0 - 2.0 standard drinks    Types: 1 - 2 Shots of liquor per week    Comment: 1-2 shots 4-5 x week  . Drug use: Yes    Types: Marijuana    Comment: Occasional  . Sexual activity: Never  Other Topics Concern  . Not on file  Social History Narrative   Lives alone retired.  Education: BS, MS Phd chemist.  Separated 8 yrs a/o spring 2020.  Adult children son, out of home, and daughter, adult with autistic spectrum, lives with estranged wife.  2 gks.  Mother died at age 56, of COVID, in the [place] outbreak -- emotionally destabilizing event late May/early June.        Cokes 0-3 daily as reported by nursing Sept 2019.  Gets medications via blister pack prepared by pharmacy a/o spring 2020, with generally good compliance but some mixing up of times and dates.   Social Determinants of Health   Financial Resource Strain:   . Difficulty of Paying Living Expenses: Not on file  Food Insecurity:   . Worried About Charity fundraiser in the Last Year: Not on file  . Ran Out of Food in the Last  Year: Not on file  Transportation Needs:   . Lack of Transportation (Medical): Not on file  . Lack of Transportation (Non-Medical): Not on file  Physical Activity:   . Days of Exercise per Week: Not on file  . Minutes of Exercise per Session: Not on file  Stress:   . Feeling of Stress : Not on file  Social Connections:   . Frequency of Communication with Friends and Family: Not on file  . Frequency of Social Gatherings with  Friends and Family: Not on file  . Attends Religious Services: Not on file  . Active Member of Clubs or Organizations: Not on file  . Attends Archivist Meetings: Not on file  . Marital Status: Not on file  Intimate Partner Violence:   . Fear of Current or Ex-Partner: Not on file  . Emotionally Abused: Not on file  . Physically Abused: Not on file  . Sexually Abused: Not on file    Past Medical History, Surgical history, Social history, and Family history were reviewed and updated as appropriate.   Please see review of systems for further details on the patient's review from today.   Objective:   Physical Exam:  BP 128/70   Pulse 65   Physical Exam Constitutional:      General: He is not in acute distress.    Appearance: He is well-developed.  Musculoskeletal:        General: No deformity.  Neurological:     Mental Status: He is alert and oriented to person, place, and time.     Coordination: Coordination normal.  Psychiatric:        Attention and Perception: Attention and perception normal. He does not perceive auditory or visual hallucinations.        Mood and Affect: Mood is anxious. Mood is not depressed. Affect is not labile, blunt, angry or inappropriate.        Speech: Speech normal.        Behavior: Behavior normal.        Thought Content: Thought content normal. Thought content is not paranoid or delusional. Thought content does not include homicidal or suicidal ideation. Thought content does not include homicidal or suicidal plan.         Cognition and Memory: Cognition normal. He exhibits impaired recent memory.        Judgment: Judgment normal.     Comments: Insight intact     Lab Review:     Component Value Date/Time   NA 139 06/23/2018 1834   K 4.2 06/23/2018 1834   CL 106 06/23/2018 1834   CO2 24 06/23/2018 1834   GLUCOSE 89 06/23/2018 1834   BUN 17 06/23/2018 1834   CREATININE 1.32 (H) 06/23/2018 1834   CALCIUM 9.2 06/23/2018 1834   PROT 6.4 (L) 06/23/2018 1834   ALBUMIN 4.1 06/23/2018 1834   AST 17 06/23/2018 1834   ALT 14 06/23/2018 1834   ALKPHOS 46 06/23/2018 1834   BILITOT 0.9 06/23/2018 1834   GFRNONAA 54 (L) 06/23/2018 1834   GFRAA >60 06/23/2018 1834       Component Value Date/Time   WBC 11.3 (H) 06/23/2018 1834   RBC 4.42 06/23/2018 1834   HGB 13.6 06/23/2018 1834   HCT 42.3 06/23/2018 1834   PLT 258 06/23/2018 1834   MCV 95.7 06/23/2018 1834   MCH 30.8 06/23/2018 1834   MCHC 32.2 06/23/2018 1834   RDW 12.4 06/23/2018 1834    No results found for: POCLITH, LITHIUM   No results found for: PHENYTOIN, PHENOBARB, VALPROATE, CBMZ   .res Assessment: Plan:   Pt reports that overall mood and anxiety are consistent with baseline with the exception of some recent worsening worry and rumination re: family stressors and he plans to discuss this further with therapist at next visit.  Will continue current medications without changes and will await refill request from [facility] in preparation of monthly dose packs.  Recommend continuing psychotherapy with [name], PhD. Pt to f/u in  4 weeks or sooner if clinically indicated.  Patient advised to contact office with any questions, adverse effects, or acute worsening in signs and symptoms.  Bladyn was seen today for anxiety.  Diagnoses and all orders for this visit:  Generalized anxiety disorder  MDD (major depressive disorder), recurrent episode, mild ([place])  Insomnia due to mental condition     Please see After Visit Summary  for patient specific instructions.  Future Appointments  Date Time Provider [place]  04/17/2019  1:00 PM Blanchie Serve, PhD CP-CP None  05/08/2019 10:30 AM [name], PMHNP CP-CP None    No orders of the defined types were placed in this encounter.   -------------------------------

## 2019-04-17 ENCOUNTER — Ambulatory Visit (INDEPENDENT_AMBULATORY_CARE_PROVIDER_SITE_OTHER): Payer: Medicare Other | Admitting: Psychiatry

## 2019-04-17 ENCOUNTER — Other Ambulatory Visit: Payer: Self-pay

## 2019-04-17 DIAGNOSIS — F411 Generalized anxiety disorder: Secondary | ICD-10-CM

## 2019-04-17 DIAGNOSIS — R69 Illness, unspecified: Secondary | ICD-10-CM | POA: Diagnosis not present

## 2019-04-17 DIAGNOSIS — F330 Major depressive disorder, recurrent, mild: Secondary | ICD-10-CM

## 2019-04-17 DIAGNOSIS — G3184 Mild cognitive impairment, so stated: Secondary | ICD-10-CM | POA: Diagnosis not present

## 2019-04-17 NOTE — Progress Notes (Signed)
Psychotherapy Progress Note [facility] [name], PhD LP  Patient ID: David [name]     MRN: [id number]     Therapy format: Individual psychotherapy Date: 04/17/2019      Start: 1:05p     Stop: 1:50p     Time Spent: 45 min Location: In-person   Session narrative (presenting needs, interim history, self-report of stressors and symptoms, applications of prior therapy, status changes, and interventions made in session) Still a rift b/w David [name] and David [name], grandchildren still spend some time with David [name] at her house, son David [name] still "the intermediary", PT still worries about the grandchildren's feelings, and David [name]'s stress level, admits he does not have particular "observational data" to go by.  David [name] and David [name] moving into first house.  D David [name] noted asserting herself with David [name] reported controlling sometimes.  Not seeing any particular arguing happen when he is present, though will still wonder what happens outside of his witness.  Sleep pretty stable.  Denies any missed appts or commitments.  Has had anxiety about missing today, obviously successful keeping it.  Acknowledges times of fuzziness about commitments and times.  Will repeat in studying his calendar to be sure he has it right.  Self-reminds to check calendar, use his external memory.  Attributes past misses to "laxity" in keeping track.  Daytimer remains pretty uncluttered.  Credits trusting himself more and keeping it orderly rather than hoarding notes as he was last spring and summer.  Says he is reliably checking his commitments there before making new ones.  Denies family members would suggest any notable issues in executive function.  Affirmed use of prosthetic organization and working memory.  Rates anxiety under control for the most part.  "Residual concern" for son, "betwixt two strong-willed women".  Repeats how tough that can be, alluding to story of his father's experience.  In light of continued  lack of complaints and seemingly steady cognitive functioning, proposed he ask David [name] for a review and either obtain or ask her to be in touch next time.  At request, dictated a phrasing for asking her review.    Therapeutic modalities: Cognitive Behavioral Therapy and Solution-Oriented/Positive Psychology  Mental Status/Observations:  Appearance:   Casual     Behavior:  Appropriate  Motor:  Normal  Speech/Language:   Clear and Coherent  Affect:  Appropriate  Mood:  anxious and euthymic  Thought process:  mildly perseverative  Thought content:    WNL  Sensory/Perceptual disturbances:    WNL  Orientation:  Fully oriented  Attention:  Good  Concentration:  Good  Memory:  varies, but grossly intact and sufficient  Insight:    Good  Judgment:   Good  Impulse Control:  Good   Risk Assessment: Danger to Self: No Self-injurious Behavior: No Danger to Others: No Physical Aggression / Violence: No Duty to Warn: No Access to Firearms a concern: No  Assessment of progress:  stabilized  Diagnosis:   ICD-10-CM   1. Generalized anxiety disorder  F41.1   2. MDD (major depressive disorder), recurrent episode, mild ([place])  F33.0   3. Mild cognitive impairment  G31.84   4. r/o early stage Alzheimer's  R69    Plan:  . Ask for feedback from David [name] on how she sees him doing -- better information and good test of willingness to partner if there are needs that could unravel . Will need to assess working plans for handling cognitive decline, assuming it comes later . Other recommendations/advice as noted  above . Continue to utilize previously learned skills ad lib . Maintain medication as prescribed and work faithfully with relevant prescriber(s) if any changes are desired or seem indicated . Call the clinic on-call service, present to ER, or call 911 if any life-threatening psychiatric crisis Return in about 1 month (around 05/18/2019). Current [facility] appointments: Future Appointments  Date  Time Provider [place]  05/08/2019 10:30 AM [name], PMHNP CP-CP None  05/15/2019  1:00 PM Blanchie Serve, PhD CP-CP None    Blanchie Serve, PhD [name], PhD LP Clinical Psychologist, [facility] [address], [place], [place] [address] [phone]

## 2019-04-24 ENCOUNTER — Other Ambulatory Visit: Payer: Self-pay | Admitting: Psychiatry

## 2019-04-24 DIAGNOSIS — F330 Major depressive disorder, recurrent, mild: Secondary | ICD-10-CM

## 2019-04-24 DIAGNOSIS — F411 Generalized anxiety disorder: Secondary | ICD-10-CM

## 2019-05-01 ENCOUNTER — Other Ambulatory Visit: Payer: Self-pay | Admitting: Psychiatry

## 2019-05-02 ENCOUNTER — Other Ambulatory Visit: Payer: Self-pay | Admitting: Psychiatry

## 2019-05-02 NOTE — Telephone Encounter (Signed)
Apt 01/27, not sure why he gets #28

## 2019-05-06 ENCOUNTER — Emergency Department (HOSPITAL_COMMUNITY): Payer: Medicare Other

## 2019-05-06 ENCOUNTER — Encounter (HOSPITAL_COMMUNITY): Payer: Self-pay

## 2019-05-06 ENCOUNTER — Emergency Department (HOSPITAL_COMMUNITY)
Admission: EM | Admit: 2019-05-06 | Discharge: 2019-05-07 | Disposition: A | Discharge status: Home / Self Care | Payer: Medicare Other | Attending: Emergency Medicine | Admitting: Emergency Medicine

## 2019-05-06 ENCOUNTER — Other Ambulatory Visit: Payer: Self-pay

## 2019-05-06 DIAGNOSIS — Z79899 Other long term (current) drug therapy: Secondary | ICD-10-CM | POA: Diagnosis not present

## 2019-05-06 DIAGNOSIS — R112 Nausea with vomiting, unspecified: Secondary | ICD-10-CM | POA: Insufficient documentation

## 2019-05-06 DIAGNOSIS — K209 Esophagitis, unspecified without bleeding: Secondary | ICD-10-CM | POA: Insufficient documentation

## 2019-05-06 DIAGNOSIS — I10 Essential (primary) hypertension: Secondary | ICD-10-CM | POA: Insufficient documentation

## 2019-05-06 DIAGNOSIS — Z7982 Long term (current) use of aspirin: Secondary | ICD-10-CM | POA: Insufficient documentation

## 2019-05-06 DIAGNOSIS — N400 Enlarged prostate without lower urinary tract symptoms: Secondary | ICD-10-CM | POA: Diagnosis not present

## 2019-05-06 DIAGNOSIS — R413 Other amnesia: Secondary | ICD-10-CM | POA: Diagnosis present

## 2019-05-06 LAB — CBC
HCT: 45.4 % (ref 39.0–52.0)
Hemoglobin: 14.8 g/dL (ref 13.0–17.0)
MCH: 30.7 pg (ref 26.0–34.0)
MCHC: 32.6 g/dL (ref 30.0–36.0)
MCV: 94.2 fL (ref 80.0–100.0)
Platelets: 202 10*3/uL (ref 150–400)
RBC: 4.82 MIL/uL (ref 4.22–5.81)
RDW: 11.9 % (ref 11.5–15.5)
WBC: 7.9 10*3/uL (ref 4.0–10.5)
nRBC: 0 % (ref 0.0–0.2)

## 2019-05-06 LAB — COMPREHENSIVE METABOLIC PANEL
ALT: 20 U/L (ref 0–44)
AST: 21 U/L (ref 15–41)
Albumin: 4.6 g/dL (ref 3.5–5.0)
Alkaline Phosphatase: 51 U/L (ref 38–126)
Anion gap: 10 (ref 5–15)
BUN: 21 mg/dL (ref 8–23)
CO2: 26 mmol/L (ref 22–32)
Calcium: 9.4 mg/dL (ref 8.9–10.3)
Chloride: 103 mmol/L (ref 98–111)
Creatinine, Ser: 1.5 mg/dL — ABNORMAL HIGH (ref 0.61–1.24)
GFR calc Af Amer: 53 mL/min — ABNORMAL LOW (ref >60)
GFR calc non Af Amer: 46 mL/min — ABNORMAL LOW (ref >60)
Glucose, Bld: 101 mg/dL — ABNORMAL HIGH (ref 70–99)
Potassium: 4.2 mmol/L (ref 3.5–5.1)
Sodium: 139 mmol/L (ref 135–145)
Total Bilirubin: 1.8 mg/dL — ABNORMAL HIGH (ref 0.3–1.2)
Total Protein: 7.3 g/dL (ref 6.5–8.1)

## 2019-05-06 LAB — URINALYSIS, ROUTINE W REFLEX MICROSCOPIC
Bilirubin Urine: NEGATIVE
Glucose, UA: NEGATIVE mg/dL
Hgb urine dipstick: NEGATIVE
Ketones, ur: 5 mg/dL — AB
Leukocytes,Ua: NEGATIVE
Nitrite: NEGATIVE
Protein, ur: NEGATIVE mg/dL
Specific Gravity, Urine: 1.027 (ref 1.005–1.030)
pH: 5 (ref 5.0–8.0)

## 2019-05-06 LAB — LACTIC ACID, PLASMA: Lactic Acid, Venous: 0.9 mmol/L (ref 0.5–1.9)

## 2019-05-06 LAB — CBG MONITORING, ED: Glucose-Capillary: 115 mg/dL — ABNORMAL HIGH (ref 70–99)

## 2019-05-06 MED ORDER — PANTOPRAZOLE SODIUM 40 MG PO TBEC
40.0000 mg | DELAYED_RELEASE_TABLET | Freq: Once | ORAL | Status: AC
  Administered 2019-05-07: 40 mg via ORAL
  Filled 2019-05-06: qty 1

## 2019-05-06 MED ORDER — IOHEXOL 300 MG/ML  SOLN
100.0000 mL | Freq: Once | INTRAMUSCULAR | Status: AC | PRN
  Administered 2019-05-06: 100 mL via INTRAVENOUS

## 2019-05-06 MED ORDER — SODIUM CHLORIDE 0.9% FLUSH: 3.0000 mL | Freq: Once | INTRAVENOUS | Status: DC

## 2019-05-06 MED ORDER — SODIUM CHLORIDE (PF) 0.9 % IJ SOLN
INTRAMUSCULAR | Status: AC
  Filled 2019-05-06: qty 50

## 2019-05-06 MED ORDER — SODIUM CHLORIDE 0.9 % IV BOLUS
1000.0000 mL | Freq: Once | INTRAVENOUS | Status: AC
  Administered 2019-05-06: 1000 mL via INTRAVENOUS

## 2019-05-06 MED ORDER — PANTOPRAZOLE SODIUM 40 MG PO TBEC: 40.0000 mg | DELAYED_RELEASE_TABLET | Freq: Every day | ORAL | 0 refills | Status: DC

## 2019-05-06 NOTE — ED Notes (Signed)
Wife [name] [place]ar[place] called [phone]

## 2019-05-06 NOTE — ED Provider Notes (Signed)
Medical screening examination/treatment/procedure(s) were conducted as a shared visit with non-physician practitioner(s) and myself.  I personally evaluated the patient during the encounter.  EKG Interpretation  Date/Time:  Monday May 06 2019 16:42:28 EST Ventricular Rate:  77 PR Interval:    QRS Duration: 112 QT Interval:  376 QTC Calculation: 426 R Axis:   74 Text Interpretation: Sinus rhythm Atrial premature complex Incomplete right bundle branch block Baseline wander in lead(s) I II aVR aVF V2 No significant change since last tracing Confirmed by Lacretia Leigh ([id number]) on 05/06/2019 8:28:70 PM 74 year old male presents with questionable altered mental status.  However when this history is collaborated with the wife she states that she really has been trouble swallowing.  She states that his baseline has been stable.  Head CT was negative here.  Patient has suffered as noted on abdominal CT.  Will require GI follow-up.  Patient to be discharged home.   Lacretia Leigh, MD 05/06/19 [phone]

## 2019-05-06 NOTE — Discharge Instructions (Addendum)
Start Protonix 40mg  once daily for acid reflux Please follow up with [facility] Return if worsening

## 2019-05-06 NOTE — ED Triage Notes (Signed)
Pt states that he has been disoriented for 1-2 days. Pt states that he has been confused on the day, location, etc. Pt can follow conversation and commands.

## 2019-05-06 NOTE — ED Provider Notes (Signed)
[facility] DEPT Provider Note   CSN: [id number] Arrival date & time: 05/06/19  1629     History Chief Complaint  Patient presents with  . Altered Mental Status    David [name] is a 74 y.o. male who presents with memory difficulties.  He states he is here today because "the mother of my children thinks I am losing it".  Patient states that for the past 2 to 3 weeks he has had a hard time remember the day.  He also will sometimes forget where he is forget what he ate during the day.  Symptoms are intermittent.  He denies headache, persistent dizziness, paresthesias, seizure-like activity, tremors, unilateral weakness, vision changes.  No chest pain, shortness of breath, abdominal pain.  Sometimes he will get lightheaded if he stands up too quickly.  He states that the mother of his children is a [employer] scientist and decided to drop him off today because of the confusion.  He has not seen his doctor for this problem.  He has a diagnosis of dementia in his chart but states that he is unaware of this diagnosis although he is on Namenda.  On chart review he has had some problems with confusion for months. He states that he does not think that anything acute is going on and states that he feels like his brain is full and he cannot sleep any details in his head anymore.  I spoke with his wife, who the patient is separated with, who states that the patient is not here for memory issues. She states he's here because he had an intense episode of vomiting for about an hour last night. She thinks he was vomiting up mucous and had bad hiccups. She thinks he may have really bad GERD and some element of dysphagia.   HPI     Past Medical History:  Diagnosis Date  . Anxiety   . BPH (benign prostatic hyperplasia)   . Dementia ([place])   . Depression   . Hyperlipidemia   . Hypertension   . Kidney stone   . Memory loss   . 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 Sleep  apnea    CPAP  . Vitamin D deficiency     Patient Active Problem List   Diagnosis Date Noted  . 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 Generalized anxiety disorder 10/28/2017  . Dyslipidemia 07/10/2012  . Metabolic syndrome 60/73/7106  . Prostatic hypertrophy 07/10/2012  . Testosterone deficiency 07/10/2012  . Essential hypertension 06/27/2011  . MDD (major depressive disorder), recurrent episode, mild ([place]) 05/16/2011    Past Surgical History:  Procedure Laterality Date  . CATARACT EXTRACTION, BILATERAL  2018  . EXTERNAL EAR SURGERY     child  . EYE SURGERY Bilateral    cataract  . right knee meniscus  06/12/2008  . right talus repair     dislocated       Family History  Problem Relation Age of Onset  . Alzheimer's disease Mother   . Depression Mother   . Cancer Father        prostate  . Thyroid disease Father   . Alzheimer's disease Maternal Grandmother   . Heart disease Maternal Grandfather   . Stroke Paternal Grandmother   . Stroke Paternal Grandfather   . Alcoholism Brother   . Autism Daughter   . Autism Maternal Uncle     Social History   Tobacco Use  . Smoking status: Never Smoker  .  Smokeless tobacco: Never Used  Substance Use Topics  . Alcohol use: Yes    Alcohol/week: 1.0 - 2.0 standard drinks    Types: 1 - 2 Shots of liquor per week    Comment: 1-2 shots 4-5 x week  . Drug use: Yes    Types: Marijuana    Comment: Occasional    Home Medications Prior to Admission medications   Medication Sig Start Date End Date Taking? Authorizing Provider  aspirin EC 81 MG tablet Take 81 mg by mouth daily.   Yes [provider]  buPROPion (WELLBUTRIN XL) 150 MG 24 hr tablet TAKE 1 TABLET ONCE DAILY. Patient taking differently: Take 150 mg by mouth daily.  04/25/19  Yes [name], PMHNP  Cholecalciferol (VITAMIN D3 PO) Take 2,000 Units by mouth daily.   Yes [provider]  donepezil (ARICEPT) 5 MG  tablet TAKE ONE TABLET AT BEDTIME. Patient taking differently: Take 5 mg by mouth at bedtime.  04/25/19  Yes [name], PMHNP  escitalopram (LEXAPRO) 20 MG tablet TAKE 1 TABLET ONCE DAILY. Patient taking differently: Take 20 mg by mouth daily.  04/25/19  Yes [name], PMHNP  LORazepam (ATIVAN) 0.5 MG tablet TAKE ONE TABLET AT BEDTIME. Patient taking differently: Take 0.5 mg by mouth at bedtime.  05/02/19  Yes [name], PMHNP  memantine (NAMENDA) 10 MG tablet TAKE 1 TABLET BY MOUTH TWICE DAILY. Patient taking differently: Take 10 mg by mouth 2 (two) times daily.  04/25/19  Yes [name], PMHNP  OLANZapine (ZYPREXA) 5 MG tablet TAKE ONE TABLET AT BEDTIME. Patient taking differently: Take 5 mg by mouth at bedtime.  04/25/19  Yes [name], PMHNP  simvastatin (ZOCOR) 40 MG tablet Take 40 mg by mouth every evening.   Yes [provider]  Tamsulosin HCl (FLOMAX) 0.4 MG CAPS Take 0.4 mg by mouth daily.    Yes [provider]  vitamin B-12 (CYANOCOBALAMIN) 1000 MCG tablet Take 1,000 mcg by mouth daily.   Yes [provider]    Allergies    Asparagus, Other, Abilify [aripiprazole], Lamictal [lamotrigine], and Wellbutrin [bupropion]  Review of Systems   Review of Systems  Constitutional: Negative for chills and fever.  Respiratory: Positive for cough. Negative for shortness of breath.   Cardiovascular: Negative for chest pain.  Gastrointestinal: Positive for vomiting. Negative for abdominal pain, diarrhea and nausea.  Neurological: Negative for dizziness, seizures, syncope, weakness and headaches.  Psychiatric/Behavioral: Positive for confusion.  All other systems reviewed and are negative.   Physical Exam Updated Vital Signs BP 133/74 (BP Location: Right Arm)   Pulse 82   Temp 99.2 F (37.3 C) (Oral)   Resp 20   SpO2 98%   Physical Exam Vitals and nursing note reviewed.  Constitutional:      General: He is not in acute distress.     Appearance: Normal appearance. He is well-developed. He is not ill-appearing.     Comments: Calm, cooperative. Pleasant  HENT:     Head: Normocephalic and atraumatic.  Eyes:     General: No scleral icterus.       Right eye: No discharge.        Left eye: No discharge.     Conjunctiva/sclera: Conjunctivae normal.     Pupils: Pupils are equal, round, and reactive to light.  Cardiovascular:     Rate and Rhythm: Normal rate and regular rhythm.  Pulmonary:     Effort: Pulmonary effort is normal. No respiratory distress.  Breath sounds: Normal breath sounds.  Abdominal:     General: There is no distension.     Palpations: Abdomen is soft.     Tenderness: There is no abdominal tenderness.  Musculoskeletal:     Cervical back: Normal range of motion.  Skin:    General: Skin is warm and dry.  Neurological:     Mental Status: He is alert and oriented to person, place, and time.     Comments: Lying on stretcher in NAD. GCS 15. Speaks in a clear voice. Cranial nerves II through XII grossly intact. 5/5 strength in all extremities. Sensation fully intact.  Bilateral finger-to-nose intact. Ambulatory    Psychiatric:        Behavior: Behavior normal.     ED Results / Procedures / Treatments   Labs (all labs ordered are listed, but only abnormal results are displayed) Labs Reviewed  COMPREHENSIVE METABOLIC PANEL - Abnormal; Notable for the following components:      Result Value   Glucose, Bld 101 (*)    Creatinine, Ser 1.50 (*)    Total Bilirubin 1.8 (*)    GFR calc non Af Amer 46 (*)    GFR calc Af Amer 53 (*)    All other components within normal limits  URINALYSIS, ROUTINE W REFLEX MICROSCOPIC - Abnormal; Notable for the following components:   Ketones, ur 5 (*)    All other components within normal limits  CBG MONITORING, ED - Abnormal; Notable for the following components:   Glucose-Capillary 115 (*)    All other components within normal limits  CBC  LACTIC ACID, PLASMA     EKG EKG Interpretation  Date/Time:  Monday May 06 2019 16:42:28 EST Ventricular Rate:  77 PR Interval:    QRS Duration: 112 QT Interval:  376 QTC Calculation: 426 R Axis:   74 Text Interpretation: Sinus rhythm Atrial premature complex Incomplete right bundle branch block Baseline wander in lead(s) I II aVR aVF V2 No significant change since last tracing Confirmed by Lacretia Leigh ([id number]) on 05/06/2019 8:23:39 PM   Radiology DG Chest 2 View  Result Date: 05/06/2019 CLINICAL DATA:  Vomiting, altered mental status, shortness of breath EXAM: CHEST - 2 VIEW COMPARISON:  Radiograph 10/28/2017 FINDINGS: No consolidation, features of edema, pneumothorax, or effusion. The aorta is calcified. The remaining cardiomediastinal contours are unremarkable. No acute osseous or soft tissue abnormality. Degenerative changes are present in the imaged spine and shoulders. Telemetry leads overlie the chest. IMPRESSION: No acute cardiopulmonary abnormality. Aortic Atherosclerosis (ICD10-I70.0). Electronically Signed   By: Lovena Le M.D.   On: 05/06/2019 22:55   CT Abdomen Pelvis W Contrast  Result Date: 05/06/2019 CLINICAL DATA:  Disoriented for 2 days, confusion, nausea and vomiting today EXAM: CT ABDOMEN AND PELVIS WITH CONTRAST TECHNIQUE: Multidetector CT imaging of the abdomen and pelvis was performed using the standard protocol following bolus administration of intravenous contrast. CONTRAST:  158mL OMNIPAQUE IOHEXOL 300 MG/ML  SOLN COMPARISON:  CT abdomen pelvis 06/13/2018 FINDINGS: Lower chest: Centrilobular and paraseptal emphysematous changes are noted in the lung bases. Scarring noted in the anterior right middle lobe with some adjacent subpleural fat. Normal heart size. No pericardial effusion. Hepatobiliary: No focal liver abnormality is seen. No gallstones, gallbladder wall thickening, or biliary dilatation. Pancreas: Unremarkable. No pancreatic ductal dilatation or surrounding inflammatory  changes. Spleen: Mild splenomegaly. No focal splenic lesions. Adrenals/Urinary Tract: Normal adrenal glands. Mild bilateral symmetric perinephric stranding, a nonspecific finding though may correlate with either age or decreased renal  function. Fluid attenuation 1.3 cm cyst in the lower pole left kidney. No worrisome renal lesions. No urolithiasis or hydronephrosis. There is circumferential bladder wall thickening with faint perivesicular hazy stranding. Indentation of the bladder base by an enlarged prostate. Stomach/Bowel: There is marked circumferential distal esophageal thickening. Stomach and duodenal sweep are unremarkable. No small bowel dilatation or wall thickening. Normal appendix. Inspissated material near the appendiceal tip is similar to comparison. No colonic dilatation or wall thickening. Scattered colonic diverticula without focal pericolonic inflammation to suggest diverticulitis. Vascular/Lymphatic: Atherosclerotic plaque within the normal caliber aorta. No suspicious or enlarged lymph nodes in the included lymphatic chains. Reproductive: Coarse eccentric calcification of the mildly enlarged prostate. No other abnormalities of the prostate or seminal vesicles. Other: No abdominopelvic free fluid or free gas. No bowel containing hernias. Small fat containing left inguinal hernia. Musculoskeletal: No acute osseous abnormality or suspicious osseous lesion. Multilevel degenerative changes are present in the imaged portions of the spine. Mild levocurvature of the lumbar spine, apex L4. 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 IMPRESSION: 1. Circumferential bladder wall thickening with faint perivesicular hazy stranding. Findings may be seen with cystitis or as sequela of chronic outlet obstruction. Correlate with urinalysis and patient's urinary symptoms. 2. Circumferential thickening of the distal  thoracic esophagus, while this could be seen with esophagitis, the focality of this finding warrants further evaluation with direct endoscopy as esophageal mass could have this appearance. 3. Mild splenomegaly. 4. Colonic diverticulosis without evidence of diverticulitis. 5. Coarse eccentric calcification of the mildly enlarged prostate gland. 6. Small fat containing left inguinal hernia. 7. 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 8. Emphysema (ICD10-J43.9). 9.  Aortic Atherosclerosis (ICD10-I70.0). These results were called by telephone at the time of interpretation on 05/06/2019 at 11:19 pm to provider [facility] , who verbally acknowledged these results. Electronically Signed   By: Lovena Le M.D.   On: 05/06/2019 23:19    Procedures Procedures (including critical care time)  Medications Ordered in ED Medications  sodium chloride 0.9 % bolus 1,000 mL (0 mLs Intravenous Stopped 05/06/19 2352)  iohexol (OMNIPAQUE) 300 MG/ML solution 100 mL (100 mLs Intravenous Contrast Given 05/06/19 2252)  pantoprazole (PROTONIX) EC tablet 40 mg (40 mg Oral Given 05/07/19 0009)    ED Course  I have reviewed the triage vital signs and the nursing notes.  Pertinent labs & imaging results that were available during my care of the patient were reviewed by me and considered in my medical decision making (see chart for details).  74 year old male presents with "AMS". The patient tells me that he is here because of worsening confusion and triage note mentions this as well. He is mildly hypertensive but otherwise vitals are normal. His exam is overall unremarkable. He is not very confused but does have some mild short term memory issues which seem to be chronic. He was initially worked up for this and labs, UA, and CT head were obtained. Labs showed a mild AKI for which he was given a fluid bolus. Shared visit with Dr. Zenia Resides.  I called his wife  David [name] who states that's not why she brought him to the ED. She tells me that he's had difficulty swallowing and was vomiting for a prolonged period of time last night. The vomiting was not post-tussive vomiting but was associated with a cough. She states the emesis looked like mucous. When I  ask the patient about this he acknowledged this happened but it wasn't that bad. Will add on CXR and CT abdomen/pelvis.  CT is remarkable for diffuse thickening of the distal esophagus and bladder. His UA is normal and the radiologist felt that it could also be due to chronic outlet obstruction. The thickening of the esophagus is likely the cause of his symptoms. Will start him on a scheduled PPI and have him f/u with GI. David [name] is requesting [facility].  MDM Rules/Calculators/A&P                       Final Clinical Impression(s) / ED Diagnoses Final diagnoses:  Esophagitis    Rx / DC Orders ED Discharge Orders    None       Recardo Evangelist, PA-C 05/08/19 1841    Lacretia Leigh, MD 05/09/19 [phone]

## 2019-05-08 ENCOUNTER — Encounter: Payer: Self-pay | Admitting: Psychiatry

## 2019-05-08 ENCOUNTER — Other Ambulatory Visit: Payer: Self-pay

## 2019-05-08 ENCOUNTER — Ambulatory Visit (INDEPENDENT_AMBULATORY_CARE_PROVIDER_SITE_OTHER): Payer: Medicare Other | Admitting: Psychiatry

## 2019-05-08 VITALS — Wt 248.0 lb

## 2019-05-08 DIAGNOSIS — F32A Depression, unspecified: Secondary | ICD-10-CM

## 2019-05-08 DIAGNOSIS — F5105 Insomnia due to other mental disorder: Secondary | ICD-10-CM

## 2019-05-08 DIAGNOSIS — F411 Generalized anxiety disorder: Secondary | ICD-10-CM

## 2019-05-08 DIAGNOSIS — F329 Major depressive disorder, single episode, unspecified: Secondary | ICD-10-CM | POA: Diagnosis not present

## 2019-05-08 NOTE — Progress Notes (Signed)
David [name] [id number] March 07, 1946 74 y.o.  Subjective:   Patient ID:  David [name] is a 74 y.o. (DOB [birth year]) male.  Chief Complaint:  Chief Complaint  Patient presents with  . Follow-up    h/o Depresion, Anxiety, and insomnia    HPI David [name] presents to the office today for follow-up of depression, anxiety, and insomnia. Has not been able to see his grandchildren as often as he would like. He reports that he continues to be concerned about ongoing conflict within the family. He reports that he has had some anxiety at times, "and I just try to give it up to the Higher Power... most of the time it is successful." He reports constant low level anxiety. He reports that he is trying not to catastrophize. He reports that he has some sadness in response to situational factors. He reports that his energy and motivation have been fair. Has continued to go for regular walks. He reports that appetite has been stable. He reports that his sleep is ok and he is falling asleep without difficulty. He reports that he has to set an alarm when he needs to go to an appointment since he is usually up later. Concentration is variable. He reports that he enjoys reading magazines and enjoys learning new words. Denies SI. Denies impulsive or risky behavior. Denies paranoia. Denies AH or VH.   He reports that his relationship with [name] is not what he would like. He reports that she continues to assist him at times.   "Everything seems to be very stable."   AIMS     Office Visit from 04/10/2019 in [place] Visit from 02/25/2019 in [place] Visit from 01/14/2019 in [place] Visit from 12/04/2018 in [facility] Visit from 11/05/2018 in [place] Total Score  0  2  0  0  0    Mini-Mental     Office Visit from 06/19/2018 in [facility] Visit from  01/01/2018 in [facility] Office Visit from 11/14/2016 in [facility] Office Visit from 11/12/2015 in [facility] Office Visit from 05/13/2015 in [facility]  Total Score (max 30 points )  23  25  28  30  30     PHQ2-9     Office Visit from 12/14/2017 in [facility] from 05/25/2015 in [facility]  PHQ-2 Total Score  5  4  PHQ-9 Total Score  15  14       Review of Systems:  Review of Systems  Gastrointestinal:       Reports that he was seen in the ER on 05/06/19 after he was having difficulty keeping food down. Reports that [name] recommended they go to ER and exam was unremarkable. Pt reports that he has been trying to eat slower and smaller quantities.   Musculoskeletal: Negative for gait problem.  Neurological:       Mild occ tremor  Psychiatric/Behavioral:       Please refer to HPI    Medications: I have reviewed the patient's current medications.  Current Outpatient Medications  Medication Sig Dispense Refill  . aspirin EC 81 MG tablet Take 81 mg by mouth daily.    Marland Kitchen buPROPion (WELLBUTRIN XL) 150 MG 24 hr tablet TAKE 1 TABLET ONCE DAILY. (Patient taking differently: Take 150 mg by mouth daily. ) 28 tablet 0  . Cholecalciferol (VITAMIN D3  PO) Take 2,000 Units by mouth daily.    Marland Kitchen donepezil (ARICEPT) 5 MG tablet TAKE ONE TABLET AT BEDTIME. (Patient taking differently: Take 5 mg by mouth at bedtime. ) 28 tablet 0  . escitalopram (LEXAPRO) 20 MG tablet TAKE 1 TABLET ONCE DAILY. (Patient taking differently: Take 20 mg by mouth daily. ) 28 tablet 0  . LORazepam (ATIVAN) 0.5 MG tablet TAKE ONE TABLET AT BEDTIME. (Patient taking differently: Take 0.5 mg by mouth at bedtime. ) 28 tablet 0  . memantine (NAMENDA) 10 MG tablet TAKE 1 TABLET BY MOUTH TWICE DAILY. (Patient taking differently: Take 10 mg by mouth 2 (two) times daily. ) 56 tablet 0  . OLANZapine (ZYPREXA) 5 MG tablet TAKE  ONE TABLET AT BEDTIME. (Patient taking differently: Take 5 mg by mouth at bedtime. ) 28 tablet 0  . pantoprazole (PROTONIX) 40 MG tablet Take 1 tablet (40 mg total) by mouth daily. 30 tablet 0  . simvastatin (ZOCOR) 40 MG tablet Take 40 mg by mouth every evening.    . Tamsulosin HCl (FLOMAX) 0.4 MG CAPS Take 0.4 mg by mouth daily.     . vitamin B-12 (CYANOCOBALAMIN) 1000 MCG tablet Take 1,000 mcg by mouth daily.     No current facility-administered medications for this visit.    Medication Side Effects: None  Allergies:  Allergies  Allergen Reactions  . Asparagus Hives  . Other     Other reaction(s): Other Sleep walk   . Abilify [Aripiprazole] Other (See Comments)    Short term memory loss  . Lamictal [Lamotrigine]     Increased agitation  . Wellbutrin [Bupropion] Anxiety    Patient does not recall allergy type Anxiety, confusion per Dr Edwin Dada note    Past Medical History:  Diagnosis Date  . Anxiety   . BPH (benign prostatic hyperplasia)   . Dementia ([place])   . Depression   . Hyperlipidemia   . Hypertension   . Kidney stone   . Memory loss   . 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 Sleep apnea    CPAP  . Vitamin D deficiency     Family History  Problem Relation Age of Onset  . Alzheimer's disease Mother   . Depression Mother   . Cancer Father        prostate  . Thyroid disease Father   . Alzheimer's disease Maternal Grandmother   . Heart disease Maternal Grandfather   . Stroke Paternal Grandmother   . Stroke Paternal Grandfather   . Alcoholism Brother   . Autism Daughter   . Autism Maternal Uncle     Social History   Socioeconomic History  . Marital status: Legally Separated    Spouse name: [name]  . Number of children: 2  . Years of education: 63  . 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 Smoking status: Never Smoker  . Smokeless tobacco: Never Used  Substance and  Sexual Activity  . Alcohol use: Yes    Alcohol/week: 1.0 - 2.0 standard drinks    Types: 1 - 2 Shots of liquor per week    Comment: 1-2 shots 4-5 x week  . Drug use: Yes    Types: Marijuana    Comment: Occasional  . Sexual activity: Never  Other Topics Concern  . Not on file  Social History Narrative   Lives alone retired.  Education: BS, MS Database administrator.  Separated  8 yrs a/o spring 2020.  Adult children son, out of home, and daughter, adult with autistic spectrum, lives with estranged wife.  2 gks.  Mother died at age 56, of COVID, in the [place] outbreak -- emotionally destabilizing event late May/early June.        Cokes 0-3 daily as reported by nursing Sept 2019.  Gets medications via blister pack prepared by pharmacy a/o spring 2020, with generally good compliance but some mixing up of times and dates.   Social Determinants of Health   Financial Resource Strain:   . Difficulty of Paying Living Expenses: Not on file  Food Insecurity:   . Worried About Charity fundraiser in the Last Year: Not on file  . Ran Out of Food in the Last Year: Not on file  Transportation Needs:   . Lack of Transportation (Medical): Not on file  . Lack of Transportation (Non-Medical): Not on file  Physical Activity:   . Days of Exercise per Week: Not on file  . Minutes of Exercise per Session: Not on file  Stress:   . Feeling of Stress : Not on file  Social Connections:   . Frequency of Communication with Friends and Family: Not on file  . Frequency of Social Gatherings with Friends and Family: Not on file  . Attends Religious Services: Not on file  . Active Member of Clubs or Organizations: Not on file  . Attends Archivist Meetings: Not on file  . Marital Status: Not on file  Intimate Partner Violence:   . Fear of Current or Ex-Partner: Not on file  . Emotionally Abused: Not on file  . Physically Abused: Not on file  . Sexually Abused: Not on file    Past Medical History, Surgical  history, Social history, and Family history were reviewed and updated as appropriate.   Please see review of systems for further details on the patient's review from today.   Objective:   Physical Exam:  Wt 248 lb (112.5 kg)   BMI 31.84 kg/m   Physical Exam Constitutional:      General: He is not in acute distress.    Appearance: He is well-developed.  Musculoskeletal:        General: No deformity.  Neurological:     Mental Status: He is alert and oriented to person, place, and time.     Coordination: Coordination normal.  Psychiatric:        Attention and Perception: Attention and perception normal. He does not perceive auditory or visual hallucinations.        Mood and Affect: Mood is not depressed. Affect is not labile, blunt, angry or inappropriate.        Speech: Speech normal.        Behavior: Behavior normal.        Thought Content: Thought content normal. Thought content is not paranoid or delusional. Thought content does not include homicidal or suicidal ideation. Thought content does not include homicidal or suicidal plan.        Cognition and Memory: He exhibits impaired recent memory.        Judgment: Judgment normal.     Comments: Insight intact Mood presents as mildly anxious.      Lab Review:     Component Value Date/Time   NA 139 05/06/2019 1642   K 4.2 05/06/2019 1642   CL 103 05/06/2019 1642   CO2 26 05/06/2019 1642   GLUCOSE 101 (H) 05/06/2019 1642   BUN  21 05/06/2019 1642   CREATININE 1.50 (H) 05/06/2019 1642   CALCIUM 9.4 05/06/2019 1642   PROT 7.3 05/06/2019 1642   ALBUMIN 4.6 05/06/2019 1642   AST 21 05/06/2019 1642   ALT 20 05/06/2019 1642   ALKPHOS 51 05/06/2019 1642   BILITOT 1.8 (H) 05/06/2019 1642   GFRNONAA 46 (L) 05/06/2019 1642   GFRAA 53 (L) 05/06/2019 1642       Component Value Date/Time   WBC 7.9 05/06/2019 1642   RBC 4.82 05/06/2019 1642   HGB 14.8 05/06/2019 1642   HCT 45.4 05/06/2019 1642   PLT 202 05/06/2019 1642   MCV  94.2 05/06/2019 1642   MCH 30.7 05/06/2019 1642   MCHC 32.6 05/06/2019 1642   RDW 11.9 05/06/2019 1642    No results found for: POCLITH, LITHIUM   No results found for: PHENYTOIN, PHENOBARB, VALPROATE, CBMZ   .res Assessment: Plan:   Will continue current medications without any changes. Continue Wellbutrin XL 150 mg daily for depression. Continue Lexapro 20 mg daily for depression and anxiety. Continue Namenda 10 mg twice daily for dementia. Continue olanzapine 5 mg at bedtime for history of psychosis. Continue lorazepam 0.5 mg at bedtime for anxiety and insomnia. Recommend continuing psychotherapy with [name], PhD. Patient to follow-up with this provider in 6 weeks or sooner if clinically indicated. Patient advised to contact office with any questions, adverse effects, or acute worsening in signs and symptoms.  Tysen was seen today for follow-up.  Diagnoses and all orders for this visit:  Generalized anxiety disorder  Depression, unspecified depression type  Insomnia due to mental condition     Please see After Visit Summary for patient specific instructions.  Future Appointments  Date Time Provider [place]  05/15/2019  1:00 PM Blanchie Serve, PhD [facility] None  06/19/2019 10:30 AM [name], PMHNP [facility] None    No orders of the defined types were placed in this encounter.   -------------------------------

## 2019-05-15 ENCOUNTER — Ambulatory Visit (INDEPENDENT_AMBULATORY_CARE_PROVIDER_SITE_OTHER): Payer: Medicare Other | Admitting: Psychiatry

## 2019-05-15 ENCOUNTER — Other Ambulatory Visit: Payer: Self-pay

## 2019-05-15 DIAGNOSIS — R69 Illness, unspecified: Secondary | ICD-10-CM | POA: Diagnosis not present

## 2019-05-15 DIAGNOSIS — F330 Major depressive disorder, recurrent, mild: Secondary | ICD-10-CM

## 2019-05-15 DIAGNOSIS — F659 Paraphilia, unspecified: Secondary | ICD-10-CM

## 2019-05-15 DIAGNOSIS — G3184 Mild cognitive impairment, so stated: Secondary | ICD-10-CM

## 2019-05-15 DIAGNOSIS — Z639 Problem related to primary support group, unspecified: Secondary | ICD-10-CM

## 2019-05-15 NOTE — Progress Notes (Signed)
Psychotherapy Progress Note [facility] [name], PhD LP  Patient ID: David [name]     MRN: [id number] Therapy format: Individual psychotherapy Date: 05/15/2019      Start: 1:05p     Stop: 2:05p     Time Spent: 45 min (remainder donated) Location: In-person   Session narrative (presenting needs, interim history, self-report of stressors and symptoms, applications of prior therapy, status changes, and interventions made in session) Mulling over whether he's "addicted" to his relationship with [name], just kind of jumped into mind on the way over today.  Plausible, in his mind, coming from a family of addicts (brother, for one), and feels he lacks a modus vivendi (his word) for living separated.  Probed wishes, meanings, and origins, as able.  Supportively confronted that [name] reported breaking point was his paraphilia and the embarrassment to her when he took underwear from a male friend's house and allegedly made domestic workers feel uncomfortable.  Previous therapy established that his parents were emotionally stingy, little demonstrated affection, as if physicality was taboo, no guidance on puberty.  Recalls latency age curiosity about his parents' room, noticed scents (cologne, perfume) there, interprets for himself that he was seeking connection with them in absentia.  Credits his exploring to a curiosity about intimate dealings between people, and the more secretive they were, the more curious the boy would be, until there was a kind of rush of its own going through forbidden drawers.  Taking on the idea of addiction, framed it as defined by (1) what lengths one would go to -- and what rules willing to break -- to get the desired thing, and (2) a self-concept that he couldn't be OK without the object of his desire.  Agreed that neither view exactly fits him with [name], though he understandably wishes he could turn back the clock.  As he claims not to have any  further impulses for some time, allowed to rest the subject as regrets he cannot undo, only work fairly within the changed relationship and accept the amicable (but duty-defined) help she offers and participate as allowed in his developmentally disabled daughter's life.  Clear enough that he is not renewing last year's angst and overconcern that [name] needs her father in the home.  Denies notable episodes of confusion, poor orientation, or disorganization in recent times.  Discussed purposes of therapy, need for concurrent feedback on how he is doing compared to original purposes, as well as current outlook for PT's welfare as cared for by estranged wife and in context of her need to work and the reportedly stalemated conflict between her and daughter-in-law.  Therapeutic modalities: Cognitive Behavioral Therapy, Solution-Oriented/Positive Psychology and Ego-Supportive  Mental Status/Observations:  Appearance:   Casual     Behavior:  Appropriate  Motor:  Normal  Speech/Language:   Clear and Coherent  Affect:  Appropriate  Mood:  dysthymic  Thought process:  normal  Thought content:    WNL  Sensory/Perceptual disturbances:    WNL  Orientation:  Fully oriented  Attention:  Good  Concentration:  Good  Memory:  grossly intact  Insight:    Fair  Judgment:   Good  Impulse Control:  Good   Risk Assessment: Danger to Self: No Self-injurious Behavior: No Danger to Others: No Physical Aggression / Violence: No Duty to Warn: No Access to Firearms a concern: No  Assessment of progress:  stabilized  Diagnosis:   ICD-10-CM   1. MDD (major depressive disorder), recurrent episode, mild (  [place])  F33.0   2. r/o early stage Alzheimer's  R69   3. 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 Homework: ask [name], on behalf of [place] and self, how am I doing and do you see any needs to work on . Self-affirm -- no, does not seem to be relationship addiction, just wishing hard could go back, and sometimes  stuck on the idea that [name] needs her father more present to be OK, could be that she really is doing OK . Encouragement that it is OK to ask for more time with [name] without it being about suspected pain . Other recommendations/advice as may be noted above . Continue to utilize previously learned skills ad lib . Maintain medication as prescribed and work faithfully with relevant prescriber(s) if any changes are desired or seem indicated . Call the clinic on-call service, present to ER, or call 911 if any life-threatening psychiatric crisis . Return in about 1 month (around 06/12/2019).Marland Kitchen  Next scheduled visit in this office 06/19/2019.  Blanchie Serve, PhD [name], PhD LP Clinical Psychologist, [facility] [address] [place],  [address] [phone]

## 2019-05-24 ENCOUNTER — Other Ambulatory Visit: Payer: Self-pay | Admitting: Psychiatry

## 2019-05-24 DIAGNOSIS — F411 Generalized anxiety disorder: Secondary | ICD-10-CM

## 2019-05-24 DIAGNOSIS — F330 Major depressive disorder, recurrent, mild: Secondary | ICD-10-CM

## 2019-05-24 NOTE — Telephone Encounter (Signed)
Last fill 01/25 they only dispense #28 each time

## 2019-06-11 ENCOUNTER — Encounter: Payer: Self-pay | Admitting: Nurse Practitioner

## 2019-06-12 ENCOUNTER — Other Ambulatory Visit: Payer: Self-pay

## 2019-06-12 ENCOUNTER — Ambulatory Visit (INDEPENDENT_AMBULATORY_CARE_PROVIDER_SITE_OTHER): Payer: Medicare Other | Admitting: Psychiatry

## 2019-06-12 DIAGNOSIS — F330 Major depressive disorder, recurrent, mild: Secondary | ICD-10-CM | POA: Diagnosis not present

## 2019-06-12 DIAGNOSIS — R69 Illness, unspecified: Secondary | ICD-10-CM

## 2019-06-12 DIAGNOSIS — F411 Generalized anxiety disorder: Secondary | ICD-10-CM

## 2019-06-12 DIAGNOSIS — C155 Malignant neoplasm of lower third of esophagus: Secondary | ICD-10-CM

## 2019-06-12 DIAGNOSIS — G3184 Mild cognitive impairment, so stated: Secondary | ICD-10-CM

## 2019-06-12 NOTE — Progress Notes (Signed)
Psychotherapy Progress Note [facility] [name], PhD LP  Patient ID: David [name]     MRN: [id number] Therapy format: Individual psychotherapy Date: 06/12/2019      Start: 1:14p     Stop: 1:58p     Time Spent: 44 min Location: In-person   Session narrative (presenting needs, interim history, self-report of stressors and symptoms, applications of prior therapy, status changes, and interventions made in session) ED trip late January for trouble swallowing, f/u with Dr. Cristina Gong yesterday learned he has esophageal cancer.  Bewildered, not sure what he wants or needs, no idea what he would prefer to do right now, and not clear what he would have been told.  Was up all night last night mulling it over.  Normally, reluctant to trust his memory, relying on daytimer, says he has been reliable with getting places and keeping appointments.  Still understands family relations to be "an uneasy truce" re. DIL Sarah and ex-W [name], but the grandkids have been successfully left overnight at [facility].  Cont usual worry about son driving for a moving company, COVID risks.    PT keeps imagining he has had COVID vax but no evidence, and unclear if he or [name] will be managing it.  Encouraged to clarify and write down.  Mar 19 will see Dr. Cristina Gong for followup.  Looked up [facility] -- Mar 27-Apr 4, recommend next meeting then as it is a meaningful time of year for observant Jewish family, could be poignant, especially if it seems to include the shadow of death now, and he probably will have a rush of appointments coming up the next few weeks.  Dr. Cristina Gong contacted in the days following this session to catch up with findings and prognosis and to solicit any advice helping PT know what to expect and ready for.  Unconfirmed by pathology as yet but clinically very sure it is esophageal cancer.  Case will hand off to oncology once that is clarified.  No requests or recommendations, just  usual understanding that radiation and possible chemotherapy will follow and discomforts associated with those.    Therapeutic modalities: Solution-Oriented/Positive Psychology, Ego-Supportive and Humanistic/Existential  Mental Status/Observations:  Appearance:   Casual     Behavior:  Appropriate and more dreamy  Motor:  Normal  Speech/Language:   clear, less content  Affect:  stunned  Mood:  depressed, resigned  Thought process:  Web designer content:    some blocking  Sensory/Perceptual disturbances:    grossly intact  Orientation:  grossly intact  Attention:  Good  Concentration:  Fair  Memory:  grossly intact  Insight:    Fair  Judgment:   Good  Impulse Control:  Good   Risk Assessment: Danger to Self: No Self-injurious Behavior: No Danger to Others: No Physical Aggression / Violence: No Duty to Warn: No Access to Firearms a concern: No  Assessment of progress:  situational setback(s)  Diagnosis:   ICD-10-CM   1. MDD (major depressive disorder), recurrent episode, mild ([place])  F33.0   2. Generalized anxiety disorder  F41.1   3. Mild cognitive impairment  G31.84   4. r/o early stage Alzheimer's  R69   5. 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 Clarify diagnosis and prognosis, call back if so moved . Ensure adequate sleep -- stress and sleep shortage will have more power to relapse his mood, anxiety, and fragmenting self-care . Other recommendations/advice as may be noted  above . Continue to utilize previously learned skills ad lib . Maintain medication as prescribed and work faithfully with relevant prescriber(s) if any changes are desired or seem indicated . Call the clinic on-call service, present to ER, or call 911 if any life-threatening psychiatric crisis . Return in about 3 weeks (around 07/03/2019), or preferably b/w 3/27 and 4/4, for time as available.Marland Kitchen  Next scheduled visit in this office 06/19/2019.  Blanchie Serve, PhD [name], PhD LP Clinical Psychologist, [facility] [address] [place], [place] [address] [phone]

## 2019-06-19 ENCOUNTER — Encounter: Payer: Self-pay | Admitting: Psychiatry

## 2019-06-19 ENCOUNTER — Other Ambulatory Visit: Payer: Self-pay

## 2019-06-19 ENCOUNTER — Ambulatory Visit (INDEPENDENT_AMBULATORY_CARE_PROVIDER_SITE_OTHER): Payer: Medicare Other | Admitting: Psychiatry

## 2019-06-19 VITALS — BP 125/82 | HR 76

## 2019-06-19 DIAGNOSIS — F5105 Insomnia due to other mental disorder: Secondary | ICD-10-CM | POA: Diagnosis not present

## 2019-06-19 DIAGNOSIS — F330 Major depressive disorder, recurrent, mild: Secondary | ICD-10-CM

## 2019-06-19 DIAGNOSIS — F411 Generalized anxiety disorder: Secondary | ICD-10-CM

## 2019-06-19 NOTE — Progress Notes (Signed)
   06/19/19 1101  Facial and Oral Movements  Muscles of Facial Expression 0  Lips and Perioral Area 0  Jaw 0  Tongue 0  Extremity Movements  Upper (arms, wrists, hands, fingers) 1  Lower (legs, knees, ankles, toes) 0  Trunk Movements  Neck, shoulders, hips 0  Overall Severity  Severity of abnormal movements (highest score from questions above) 0  Incapacitation due to abnormal movements 0  Patient's awareness of abnormal movements (rate only patient's report) 0  Dental Status  Current problems with teeth and/or dentures? No  Does patient usually wear dentures? No  AIMS Total Score  AIMS Total Score 1

## 2019-06-19 NOTE — Progress Notes (Signed)
David [name] [id number] 08/06/1945 74 y.o.  Subjective:   Patient ID:  David [name] is a 74 y.o. (DOB 15-May-1945) male.  Chief Complaint:  Chief Complaint  Patient presents with  . Follow-up    h/o Depression, anxiety, cognitive impairment    HPI David [name] presents to the office today for follow-up of mood and anxiety. He reports that his anxiety "waxes and wanes." He reports anxiety about possibly missing appointments. He reports some anxiety about throat lesion. Denies panic attacks. Describes mood as "flat" and denies depressed mood. He reports flat mood for a couple of weeks. Denies any significant irritability.  He reports that his sleep has been disrupted with difficulty falling asleep. He reports that he is napping some. He reports that he sets an alarm and when he awakens he has some initial disorientation and anxiety about where he is supposed to be. Sleeping 6-7 hours a night. Appetite has been normal. He reports feeling tired when he has had diminished sleep. Motivation is adequate. Able to concentration on reading material. Denies SI.   He reports that he has been having difficulty with orientation and keeping track of the days.   He reports that he has a "lesion on my throat that may or may not be malignant." He reports that he saw a GI specialist today.  Past Psychiatric Medication Trials: Rexulti- helpful for mood. Possible akathisia Sertraline- has been helpful for mood and anxiety. Taken since 2006. Has taken up to 300 mg po qd Effexor Paxil Lexapro- Ineffective for anxiety Cymbalta- was effective and then stopped Prozac Abilify-"more confused" and possible paradoxical reaction. Rexulti- Possible akathisia at higher dose Saphris- Minimal effect at 2.5 mg. Buspar Propranolol- Not effective Lamictal- increased agitation Wellbutrin- Adverse effects, increased agitation Alprazolam- Effective Ativan- Ineffective Valium- Calming effect Ambien-  Parasomnia Remeron- May have caused worsening confusion.  AIMS     Office Visit from 06/19/2019 in [place] Visit from 04/10/2019 in [place] Visit from 02/25/2019 in [place] Visit from 01/14/2019 in [place] Visit from 12/04/2018 in [place] Total Score  1  0  2  0  0    Mini-Mental     Office Visit from 06/19/2018 in [place] Visit from 01/01/2018 in [facility] Office Visit from 11/14/2016 in [facility] Office Visit from 11/12/2015 in [facility] Office Visit from 05/13/2015 in [facility]  Total Score (max 30 points )  23  25  28  30  30     PHQ2-9     Office Visit from 12/14/2017 in [facility] from 05/25/2015 in [facility]  PHQ-2 Total Score  5  4  PHQ-9 Total Score  15  14       Review of Systems:  Review of Systems  HENT: Negative for trouble swallowing.   Musculoskeletal: Negative for gait problem.  Neurological: Negative for tremors.  Psychiatric/Behavioral:       Please refer to HPI    Medications: I have reviewed the patient's current medications.  Current Outpatient Medications  Medication Sig Dispense Refill  . aspirin EC 81 MG tablet Take 81 mg by mouth daily.    Marland Kitchen buPROPion (WELLBUTRIN XL) 150 MG 24 hr tablet Take 1 tablet (150 mg total) by mouth daily. 28 tablet 0  . Cholecalciferol (VITAMIN D3 PO) Take 2,000 Units by mouth daily.    Marland Kitchen  donepezil (ARICEPT) 5 MG tablet Take 1 tablet (5 mg total) by mouth at bedtime. 28 tablet 0  . escitalopram (LEXAPRO) 20 MG tablet Take 1 tablet (20 mg total) by mouth daily. 28 tablet 0  . LORazepam (ATIVAN) 0.5 MG tablet TAKE ONE TABLET AT BEDTIME. 28 tablet 0  . memantine (NAMENDA) 10 MG tablet Take 1 tablet (10 mg total) by mouth 2 (two) times daily. 56 tablet 0   . OLANZapine (ZYPREXA) 5 MG tablet TAKE ONE TABLET AT BEDTIME. 28 tablet 0  . pantoprazole (PROTONIX) 40 MG tablet Take 1 tablet (40 mg total) by mouth daily. 30 tablet 0  . simvastatin (ZOCOR) 40 MG tablet Take 40 mg by mouth every evening.    . Tamsulosin HCl (FLOMAX) 0.4 MG CAPS Take 0.4 mg by mouth daily.     . vitamin B-12 (CYANOCOBALAMIN) 1000 MCG tablet Take 1,000 mcg by mouth daily.     No current facility-administered medications for this visit.    Medication Side Effects: None  Allergies:  Allergies  Allergen Reactions  . Asparagus Hives  . Other     Other reaction(s): Other Sleep walk   . Abilify [Aripiprazole] Other (See Comments)    Short term memory loss  . Lamictal [Lamotrigine]     Increased agitation  . Wellbutrin [Bupropion] Anxiety    Patient does not recall allergy type Anxiety, confusion per Dr Edwin Dada note    Past Medical History:  Diagnosis Date  . Anxiety   . BPH (benign prostatic hyperplasia)   . Dementia ([place])   . Depression   . Hyperlipidemia   . Hypertension   . Kidney stone   . Memory loss   . 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 Sleep apnea    CPAP  . Vitamin D deficiency     Family History  Problem Relation Age of Onset  . Alzheimer's disease Mother   . Depression Mother   . Cancer Father        prostate  . Thyroid disease Father   . Alzheimer's disease Maternal Grandmother   . Heart disease Maternal Grandfather   . Stroke Paternal Grandmother   . Stroke Paternal Grandfather   . Alcoholism Brother   . Autism Daughter   . Autism Maternal Uncle     Social History   Socioeconomic History  . Marital status: Legally Separated    Spouse name: [name]  . Number of children: 2  . Years of education: 43  . 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 Smoking status: Never Smoker  . Smokeless tobacco: Never Used  Substance and Sexual Activity  .  Alcohol use: Yes    Alcohol/week: 1.0 - 2.0 standard drinks    Types: 1 - 2 Shots of liquor per week    Comment: 1-2 shots 4-5 x week  . Drug use: Yes    Types: Marijuana    Comment: Occasional  . Sexual activity: Never  Other Topics Concern  . Not on file  Social History Narrative   Lives alone retired.  Education: BS, MS Phd chemist.  Separated 8 yrs a/o spring 2020.  Adult children son, out of home, and daughter, adult with autistic spectrum, lives with estranged wife.  2 gks.  Mother died at age 33, of COVID, in the [place] outbreak -- emotionally destabilizing event late May/early June.        Cokes 0-3  daily as reported by nursing Sept 2019.  Gets medications via blister pack prepared by pharmacy a/o spring 2020, with generally good compliance but some mixing up of times and dates.   Social Determinants of Health   Financial Resource Strain:   . Difficulty of Paying Living Expenses: Not on file  Food Insecurity:   . Worried About Charity fundraiser in the Last Year: Not on file  . Ran Out of Food in the Last Year: Not on file  Transportation Needs:   . Lack of Transportation (Medical): Not on file  . Lack of Transportation (Non-Medical): Not on file  Physical Activity:   . Days of Exercise per Week: Not on file  . Minutes of Exercise per Session: Not on file  Stress:   . Feeling of Stress : Not on file  Social Connections:   . Frequency of Communication with Friends and Family: Not on file  . Frequency of Social Gatherings with Friends and Family: Not on file  . Attends Religious Services: Not on file  . Active Member of Clubs or Organizations: Not on file  . Attends Archivist Meetings: Not on file  . Marital Status: Not on file  Intimate Partner Violence:   . Fear of Current or Ex-Partner: Not on file  . Emotionally Abused: Not on file  . Physically Abused: Not on file  . Sexually Abused: Not on file    Past Medical History, Surgical history, Social  history, and Family history were reviewed and updated as appropriate.   Please see review of systems for further details on the patient's review from today.   Objective:   Physical Exam:  BP 125/82   Pulse 76   Physical Exam Constitutional:      General: He is not in acute distress. Musculoskeletal:        General: No deformity.  Neurological:     Mental Status: He is alert. Mental status is at baseline.     Cranial Nerves: No dysarthria.     Coordination: Coordination normal.  Psychiatric:        Attention and Perception: Attention and perception normal. He does not perceive auditory or visual hallucinations.        Mood and Affect: Mood is anxious. Mood is not depressed. Affect is not labile, blunt, angry or inappropriate.        Speech: Speech normal.        Behavior: Behavior normal. Behavior is cooperative.        Thought Content: Thought content normal. Thought content is not paranoid or delusional. Thought content does not include homicidal or suicidal ideation. Thought content does not include homicidal or suicidal plan.        Cognition and Memory: Memory is impaired. He exhibits impaired recent memory.        Judgment: Judgment normal.     Comments: Insight intact     Lab Review:     Component Value Date/Time   NA 139 05/06/2019 1642   K 4.2 05/06/2019 1642   CL 103 05/06/2019 1642   CO2 26 05/06/2019 1642   GLUCOSE 101 (H) 05/06/2019 1642   BUN 21 05/06/2019 1642   CREATININE 1.50 (H) 05/06/2019 1642   CALCIUM 9.4 05/06/2019 1642   PROT 7.3 05/06/2019 1642   ALBUMIN 4.6 05/06/2019 1642   AST 21 05/06/2019 1642   ALT 20 05/06/2019 1642   ALKPHOS 51 05/06/2019 1642   BILITOT 1.8 (H) 05/06/2019 1642   GFRNONAA  46 (L) 05/06/2019 1642   GFRAA 53 (L) 05/06/2019 1642       Component Value Date/Time   WBC 7.9 05/06/2019 1642   RBC 4.82 05/06/2019 1642   HGB 14.8 05/06/2019 1642   HCT 45.4 05/06/2019 1642   PLT 202 05/06/2019 1642   MCV 94.2 05/06/2019  1642   MCH 30.7 05/06/2019 1642   MCHC 32.6 05/06/2019 1642   RDW 11.9 05/06/2019 1642    No results found for: POCLITH, LITHIUM   No results found for: PHENYTOIN, PHENOBARB, VALPROATE, CBMZ   .res Assessment: Plan:   Will continue Wellbutrin XL 150 mg daily for depression. Continue Lexapro 20 mg daily for anxiety and depression. Continue olanzapine 5 mg at bedtime for history of psychosis. Continue Ativan 0.5 mg at bedtime for anxiety and insomnia. Recommend continuing psychotherapy with [name], PhD. Patient to follow-up with this provider in 4 weeks or sooner if clinically indicated. Patient advised to contact office with any questions, adverse effects, or acute worsening in signs and symptoms.  Kyzer was seen today for follow-up.  Diagnoses and all orders for this visit:  Generalized anxiety disorder  MDD (major depressive disorder), recurrent episode, mild ([place])  Insomnia due to mental condition     Please see After Visit Summary for patient specific instructions.  Future Appointments  Date Time Provider [place]  07/11/2019  2:00 PM Blanchie Serve, PhD CP-CP None  07/17/2019 11:30 AM [name], PMHNP CP-CP None    No orders of the defined types were placed in this encounter.   -------------------------------

## 2019-06-21 ENCOUNTER — Encounter: Payer: Self-pay | Admitting: Oncology

## 2019-06-27 NOTE — Progress Notes (Signed)
Spoke with patient's estranged wife David [name] who is his POA.  She states they have been separated for nine years.  Patient has dementia and sever depression/anxiety disorder. She is his POA and will be his support throughout this process.  I explained what my role as GI Nurse Navigator is and she verbalized an understanding.  Also that Dr. Cristina Gong has ordered a PET scan and she has not heard about this being scheduled as yet.  I explained the importance of getting the PET scan right away so we have those results when he sees the medical oncologist Dr. Benay Spice 3/25.  She says she has already put in a call to [facility] - Dr. Buccini's nurse to inquire about this.  I explained because we didn't order the scan I can't get it scheduled and she understands the process.  She states he is eating mainly soft foods and liquids.  We will make sure he has an nutrition consult and post likely SW consult as well when we see the patient.  She was given my direct number to call in interim with any questions or concerns.

## 2019-06-28 ENCOUNTER — Other Ambulatory Visit: Payer: Self-pay | Admitting: Psychiatry

## 2019-06-28 DIAGNOSIS — F411 Generalized anxiety disorder: Secondary | ICD-10-CM

## 2019-06-28 DIAGNOSIS — F330 Major depressive disorder, recurrent, mild: Secondary | ICD-10-CM

## 2019-06-28 NOTE — Telephone Encounter (Signed)
Next apt 04/07

## 2019-07-01 ENCOUNTER — Other Ambulatory Visit: Payer: Self-pay | Admitting: Gastroenterology

## 2019-07-01 DIAGNOSIS — K228 Other specified diseases of esophagus: Secondary | ICD-10-CM

## 2019-07-01 DIAGNOSIS — K2289 Other specified disease of esophagus: Secondary | ICD-10-CM

## 2019-07-04 ENCOUNTER — Other Ambulatory Visit: Payer: Self-pay

## 2019-07-04 ENCOUNTER — Inpatient Hospital Stay: Payer: Medicare Other | Admitting: Oncology

## 2019-07-04 ENCOUNTER — Encounter: Payer: Self-pay | Admitting: *Deleted

## 2019-07-04 ENCOUNTER — Ambulatory Visit
Admission: RE | Admit: 2019-07-04 | Discharge: 2019-07-04 | Disposition: A | Discharge status: Home / Self Care | Payer: Medicare Other | Source: Ambulatory Visit | Attending: Radiation Oncology | Admitting: Radiation Oncology

## 2019-07-04 VITALS — BP 123/83 | HR 97 | Temp 98.0°F | Resp 17 | Ht 74.0 in | Wt 222.3 lb

## 2019-07-04 DIAGNOSIS — C155 Malignant neoplasm of lower third of esophagus: Secondary | ICD-10-CM | POA: Diagnosis not present

## 2019-07-04 MED ORDER — SUCRALFATE 1 G PO TABS: 1.0000 g | ORAL_TABLET | Freq: Three times a day (TID) | ORAL | 2 refills | Status: DC

## 2019-07-04 NOTE — Progress Notes (Signed)
Radiation Oncology         (336) [phone] ________________________________  Initial Outpatient Consultation - Conducted via telephone due to current COVID-19 concerns for limiting patient exposure  I spoke with the patient to conduct this consult visit via telephone to spare the patient unnecessary potential exposure in the healthcare setting during the current COVID-19 pandemic. The patient was notified in advance and was offered a [place] meeting to allow for face to face communication but unfortunately reported that they did not have the appropriate resources/technology to support such a visit and instead preferred to proceed with a telephone consult.    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   HISTORY OF PRESENT ILLNESS: David [name] is a 74 y.o. male seen at the request of Dr. Benay Spice for a newly diagnosed esophageal cancer. The patient has a history of dementia, as well as major depression and not long ago according to his wife also had thoughts of suicide. He has done much better with his depression and she states he is at baseline without thoughts of self harm. In the last few months, he developed progressive dysphagia to solids. He was seen and a CTof the abdomen and pelvis due to nausea and vomiting was performed on 05/06/19 revealing emphysematous changes of the lung bases and scarring in the RML. He had a 1.3 cm cyst in the left kidney, and circumferential bladder wall thickening, and indentation of the bladder base by an enlarged prostate. There was marked circumferential distal esophageal thickening. He was seen by Dr. Cristina Gong and underwent EGD on 06/13/19 showing encasement of the distal esophagus by a fungating tumor. Biopsies were negative for  cancer and repeat biopsy with Dr. Paulita Fujita was also nondiagnostic following this. Ultimately the pathology specimens were re-evaluated by [facility] pathology and were favored to be a poorly differentiated carcinoma. He saw Dr. Benay Spice today and will have PET scan tomorrow. He's contacted by phone and his wife is the only one available to discuss his case.    PREVIOUS RADIATION THERAPY: No   PAST MEDICAL HISTORY:  Past Medical History:  Diagnosis Date  . Anxiety   . BPH (benign prostatic hyperplasia)   . Dementia ([place])   . Depression   . Hyperlipidemia   . Hypertension   . Kidney stone   . Memory loss   . 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 Sleep apnea    CPAP  . Vitamin D deficiency        PAST SURGICAL HISTORY: Past Surgical History:  Procedure Laterality Date  . CATARACT EXTRACTION, BILATERAL  2018  . EXTERNAL EAR SURGERY     child  . EYE SURGERY Bilateral    cataract  . right knee meniscus  06/12/2008  . right talus repair     dislocated     FAMILY HISTORY:  Family History  Problem Relation Age of Onset  . Alzheimer's disease Mother   . Depression Mother   . Cancer Father        prostate  . Thyroid disease Father   . Alzheimer's disease Maternal Grandmother   . Heart disease Maternal Grandfather   . Stroke Paternal Grandmother   . Stroke Paternal Grandfather   . Alcoholism Brother   .  Autism Daughter   . Autism Maternal Uncle      SOCIAL HISTORY:  reports that he has never smoked. He has never used smokeless tobacco. He reports current alcohol use of about 1.0 - 2.0 standard drinks of alcohol per week. He reports current drug use. Drug: Marijuana.   ALLERGIES: Other, Abilify [aripiprazole], Lamictal [lamotrigine], and Wellbutrin [bupropion]   MEDICATIONS:  Current Outpatient Medications  Medication Sig Dispense Refill  . buPROPion (WELLBUTRIN XL) 150 MG 24 hr tablet TAKE 1 TABLET ONCE DAILY. 28 tablet 0  . Cholecalciferol  (VITAMIN D3 PO) Take 2,000 Units by mouth daily.    Marland Kitchen donepezil (ARICEPT) 5 MG tablet TAKE ONE TABLET AT BEDTIME. 28 tablet 0  . escitalopram (LEXAPRO) 20 MG tablet TAKE 1 TABLET ONCE DAILY. 28 tablet 0  . LORazepam (ATIVAN) 0.5 MG tablet TAKE ONE TABLET AT BEDTIME. 28 tablet 0  . memantine (NAMENDA) 10 MG tablet TAKE 1 TABLET BY MOUTH TWICE DAILY. 56 tablet 0  . OLANZapine (ZYPREXA) 5 MG tablet TAKE ONE TABLET AT BEDTIME. 28 tablet 0  . simvastatin (ZOCOR) 40 MG tablet Take 40 mg by mouth every evening.    . sucralfate (CARAFATE) 1 g tablet Take 1 tablet (1 g total) by mouth 4 (four) times daily -  with meals and at bedtime. 5 min before meals for radiation induced esophagitis 120 tablet 2  . Tamsulosin HCl (FLOMAX) 0.4 MG CAPS Take 0.4 mg by mouth daily.     . vitamin B-12 (CYANOCOBALAMIN) 1000 MCG tablet Take 1,000 mcg by mouth daily.     No current facility-administered medications for this encounter.     REVIEW OF SYSTEMS: On review of systems, the patient's dementia is an issue that his wife states keeps him from being able to completely care for himself but she intents to help him through this process. They are currently separated and lives separately. She states he has been having intolerance to liquids but tolerates pudding, but has multiple episodes of regurgitation and has lost about 10 pounds in the last 2 weeks. No odynophagia or nausea/vomiting is noted apart from the regurgitation. No other complaints are noted.    PHYSICAL EXAM:  Unable to assess due to encounter type.   ECOG = 1  0 - Asymptomatic (Fully active, able to carry on all predisease activities without restriction)  1 - Symptomatic but completely ambulatory (Restricted in physically strenuous activity but ambulatory and able to carry out work of a light or sedentary nature. For example, light housework, office work)  2 - Symptomatic, <50% in bed during the day (Ambulatory and capable of all self care but unable  to carry out any work activities. Up and about more than 50% of waking hours)  3 - Symptomatic, >50% in bed, but not bedbound (Capable of only limited self-care, confined to bed or chair 50% or more of waking hours)  4 - Bedbound (Completely disabled. Cannot carry on any self-care. Totally confined to bed or chair)  5 - Death   Eustace Pen MM, Creech RH, Tormey DC, et al. [phone]). "Toxicity and response criteria of the [facility] Group". [place] Oncol. 5 (6): 649-55    LABORATORY DATA:  Lab Results  Component Value Date   WBC 7.9 05/06/2019   HGB 14.8 05/06/2019   HCT 45.4 05/06/2019   MCV 94.2 05/06/2019   PLT 202 05/06/2019   Lab Results  Component Value Date   NA 139 05/06/2019   K 4.2 05/06/2019  CL 103 05/06/2019   CO2 26 05/06/2019   Lab Results  Component Value Date   ALT 20 05/06/2019   AST 21 05/06/2019   ALKPHOS 51 05/06/2019   BILITOT 1.8 (H) 05/06/2019      RADIOGRAPHY: No results found.     IMPRESSION/PLAN: 1. 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 Dr. Lisbeth Renshaw has reveiwed the case and I also reviewed the pathology findings and  the nature of primary esophageal disease. He is scheduled tomorrow for PET imaging and we are anticipating a role for chemoRT with curative intent. It is difficult to say if he is a surgical candidate, but Dr. Benay Spice has placed a referral to Dr. Servando Snare.  We discussed the risks, benefits, short, and long term effects of radiotherapy, and the patient's wife is interested in proceeding. I discussed the delivery and logistics of radiotherapy and anticipates a course of 5 1/2 weeks of radiotherapy. He will come tomorrow to simulate at 8 am, and verbal consent was received from his wife to proceed though he will sign written consent at that time himself. 2. Social needs. There are a lot of issues ongoing apart from his cancer diagnosis. I will reach out to social work to see if they can assist in some  resources he may be eligible for. 3. Weight loss. The patient may need more aggressive intervention for nutrition but this will be followed expectantly and the patient's wife wishes to avoid feeding tube.  Given current concerns for patient exposure during the COVID-19 pandemic, this encounter was conducted via telephone.  The patient has provided two factor identification and has given verbal consent for this type of encounter and has been advised to only accept a meeting of this type in a secure network environment. The time spent during this encounter was 60 minutes including preparation, discussion, and coordination of the patient's care. The attendants for this meeting include  Hayden Pedro and [name] Icard. During the encounter, Hayden Pedro was located remotely at home. Mrs. Flom was located out running errands during our call.    Carola Rhine, PAC

## 2019-07-04 NOTE — Progress Notes (Signed)
Referral to Dr. Servando Snare completed per Dr. Benay Spice. Their office was notified

## 2019-07-04 NOTE — Progress Notes (Signed)
David [name] Patient Consult   Requesting MD: David [name] 74 y.o.  03/19/1946    Reason for Consult: Esophagus mass   HPI: [name] presented to the emergency room 05/06/2019 with vomiting.  He reported hiccups and difficulty swallowing.  A CT of the abdomen and pelvis revealed changes of emphysema at the lung bases.  Mild splenomegaly.  Enlarged prostate with indentation of the bladder base.  Marked circumferential distal esophageal thickening was noted.  No lymphadenopathy.  He was placed on antacid therapy and referred to Dr. Erin [name]. He was taken to an upper endoscopy on 06/11/2019.  A large ulcerated mass was found in the lower third of the esophagus beginning at 36 cm from the incisors.  The mass was partially obstructing.  Biopsies were obtained.  The cardia and gastric fundus were normal on retroflexion.  Normal duodenum.  The pathology at the [facility] gastroenterology pathology lab revealed ulcerated esophageal mucosa, negative for dysplasia.  A CMV immunostain was negative.  Atypical features were present with the differential diagnosis including reactive change and a poorly differentiated neoplasm.  He was referred for a repeat endoscopy by Dr. Paulita [name] and the pathology was again nondiagnostic (we do not have these reports available today).  The pathology from 06/11/2019 and 06/21/2019 or submitted to [facility] for a second opinion.  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  I discussed the case with Dr. Donato [name] by telephone today.  He indicates there is no evidence of lymphoma.  The [facility] pathologist favor a poorly differentiated carcinoma.  [name] is accompanied by his wife.  He has significant dementia and relies on his wife for most of the history.    Past Medical History:  Diagnosis Date  . Anxiety   . BPH (benign prostatic hyperplasia)   . Dementia ([place])   . Depression   . Hyperlipidemia   .  Hypertension   . Kidney stone   . Memory loss   . 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 Sleep apnea    CPAP  . Vitamin D deficiency     Past Surgical History:  Procedure Laterality Date  . CATARACT EXTRACTION, BILATERAL  2018  . EXTERNAL EAR SURGERY     child  . EYE SURGERY Bilateral    cataract  . right knee meniscus  06/12/2008  . right talus repair     dislocated    Medications: Reviewed  Allergies:  Allergies  Allergen Reactions  . Other     Other reaction(s): Other Sleep walk   . Abilify [Aripiprazole] Other (See Comments)    Short term memory loss  . Lamictal [Lamotrigine]     Increased agitation  . Wellbutrin [Bupropion] Anxiety    Patient does not recall allergy type Anxiety, confusion per Dr David [name] note    Family history: His father died of prostate cancer in his 45s.  A maternal uncle had leukemia.  He has 2 children.  His daughter has autism.  Social History:   He lives with his wife and daughter in [place].  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  He does not use cigarettes.  He has 1-2 vodka drinks per day.  No transfusion history.  No risk factor for HIV or hepatitis.  ROS:   Positives include: 10 pound weight loss over the past 2 weeks, solid dysphagia, intermittently vomits liquids and solids, low back pain for the past year  A complete ROS was otherwise negative.  Physical Exam:  Blood pressure 123/83, pulse 97, temperature 98 F (36.7 C), temperature source Temporal, resp. rate 17, height 6\' 2"  (1.88 m), weight 222 lb 4.8 oz (100.8 kg), SpO2 98 %.  HEENT: Neck without mass Lungs: Clear bilaterally Cardiac: Regular rate and rhythm Abdomen: No hepatosplenomegaly, nontender, no mass GU: Testes without mass Vascular: No leg edema Lymph nodes: No cervical, supraclavicular, axillary, or inguinal nodes Neurologic: Alert, oriented to year-not month or day, the motor exam appears grossly intact in the upper and  lower extremities bilaterally Skin: No rash Musculoskeletal: No spine tenderness   LAB:  CBC  Lab Results  Component Value Date   WBC 7.9 05/06/2019   HGB 14.8 05/06/2019   HCT 45.4 05/06/2019   MCV 94.2 05/06/2019   PLT 202 05/06/2019        CMP  Lab Results  Component Value Date   NA 139 05/06/2019   K 4.2 05/06/2019   CL 103 05/06/2019   CO2 26 05/06/2019   GLUCOSE 101 (H) 05/06/2019   BUN 21 05/06/2019   CREATININE 1.50 (H) 05/06/2019   CALCIUM 9.4 05/06/2019   PROT 7.3 05/06/2019   ALBUMIN 4.6 05/06/2019   AST 21 05/06/2019   ALT 20 05/06/2019   ALKPHOS 51 05/06/2019   BILITOT 1.8 (H) 05/06/2019   GFRNONAA 46 (L) 05/06/2019   GFRAA 53 (L) 05/06/2019      Imaging: CT images from 05/06/2019 reviewed   Assessment/Plan:   1. Esophagus cancer  CT 05/06/2019-circumferential thickening of the distal esophagus, mild splenomegaly, bladder wall thickening, enlarged prostate  Upper endoscopy 06/11/2019-ulcerated mass at the lower esophagus, 36 cm from the incisors, partially obstructing, biopsy revealed atypical cells-reactive change versus poorly differentiated neoplasm, outside consultation at [facility] on this biopsy and a repeat biopsy 06/21/2019 confirmed a poorly differentiated malignant neoplasm 2. Dementia 3. Anxiety/depression 4. Sleep apnea 5. BPH 6. Dysphagia secondary to #1 7. Hyperlipidemia   Disposition:   [name] has a history of dysphagia for several months.  He was confirmed to have a distal esophageal mass on repeat upper endoscopies.  Biopsy has confirmed a poorly differentiated malignant neoplasm, otherwise uncharacterized.  I discussed the biopsy findings with Dr. Donato [name].  He indicates there is no evidence of lymphoma.  The tumor most likely represents a poorly differentiated carcinoma.  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  I discussed the CT, endoscopic, and biopsy findings with [name] and his wife.  They  understand he most likely has esophageal carcinoma as opposed to a metastatic lesion or an uncommon primary esophageal malignancy.  We discussed the likely treatment recommendation for concurrent chemotherapy and radiation.  I doubt he will be a surgical candidate in the setting of advanced dementia.  His wife would like to have a consultation with thoracic surgery.  We will refer him to Dr. Servando Snare.  He will be scheduled to see Dr. Lisbeth Renshaw.  Mr. Espericueta will undergo a staging PET scan on 07/05/2019.  He will return for an office visit after the staging PET scan.  We discussed the potential need for a feeding tube.  He is currently tolerating soft foods such as pudding.  We will asked the Cancer center nutritionist to contact Ms. Goostree to discuss nutrition options.  Betsy Coder, MD  07/04/2019, 5:34 PM

## 2019-07-05 ENCOUNTER — Encounter: Payer: Self-pay | Admitting: Nurse Practitioner

## 2019-07-05 ENCOUNTER — Telehealth: Payer: Self-pay | Admitting: General Practice

## 2019-07-05 ENCOUNTER — Telehealth: Payer: Self-pay | Admitting: Nutrition

## 2019-07-05 ENCOUNTER — Inpatient Hospital Stay: Payer: Medicare Other | Admitting: Nurse Practitioner

## 2019-07-05 ENCOUNTER — Ambulatory Visit
Admission: RE | Admit: 2019-07-05 | Discharge: 2019-07-05 | Disposition: A | Discharge status: Home / Self Care | Payer: Medicare Other | Source: Ambulatory Visit | Attending: Radiation Oncology | Admitting: Radiation Oncology

## 2019-07-05 ENCOUNTER — Encounter: Payer: Medicare Other | Admitting: Nutrition

## 2019-07-05 ENCOUNTER — Inpatient Hospital Stay (HOSPITAL_COMMUNITY)
Admission: RE | Admit: 2019-07-05 | Discharge: 2019-07-05 | Disposition: A | Discharge status: Home / Self Care | Payer: Medicare Other | Source: Ambulatory Visit | Attending: Gastroenterology | Admitting: Gastroenterology

## 2019-07-05 ENCOUNTER — Inpatient Hospital Stay: Payer: Medicare Other

## 2019-07-05 ENCOUNTER — Other Ambulatory Visit: Payer: Self-pay

## 2019-07-05 ENCOUNTER — Inpatient Hospital Stay (HOSPITAL_COMMUNITY)
Admission: AD | Admit: 2019-07-05 | Discharge: 2019-07-12 | DRG: 375 | Disposition: A | Discharge status: Home Health | Payer: Medicare Other | Source: Ambulatory Visit | Attending: Internal Medicine | Admitting: Internal Medicine

## 2019-07-05 VITALS — BP 130/77 | HR 85 | Temp 97.8°F | Resp 18 | Ht 74.0 in | Wt 222.0 lb

## 2019-07-05 DIAGNOSIS — R531 Weakness: Secondary | ICD-10-CM | POA: Diagnosis not present

## 2019-07-05 DIAGNOSIS — F332 Major depressive disorder, recurrent severe without psychotic features: Secondary | ICD-10-CM | POA: Diagnosis present

## 2019-07-05 DIAGNOSIS — F411 Generalized anxiety disorder: Secondary | ICD-10-CM | POA: Diagnosis present

## 2019-07-05 DIAGNOSIS — Z20822 Contact with and (suspected) exposure to covid-19: Secondary | ICD-10-CM | POA: Diagnosis present

## 2019-07-05 DIAGNOSIS — C159 Malignant neoplasm of esophagus, unspecified: Principal | ICD-10-CM

## 2019-07-05 DIAGNOSIS — Z79899 Other long term (current) drug therapy: Secondary | ICD-10-CM

## 2019-07-05 DIAGNOSIS — Z8042 Family history of malignant neoplasm of prostate: Secondary | ICD-10-CM | POA: Diagnosis not present

## 2019-07-05 DIAGNOSIS — K2289 Other specified disease of esophagus: Secondary | ICD-10-CM

## 2019-07-05 DIAGNOSIS — R162 Hepatomegaly with splenomegaly, not elsewhere classified: Secondary | ICD-10-CM | POA: Diagnosis present

## 2019-07-05 DIAGNOSIS — G4733 Obstructive sleep apnea (adult) (pediatric): Secondary | ICD-10-CM | POA: Diagnosis present

## 2019-07-05 DIAGNOSIS — R1310 Dysphagia, unspecified: Secondary | ICD-10-CM | POA: Diagnosis present

## 2019-07-05 DIAGNOSIS — F6081 Narcissistic personality disorder: Secondary | ICD-10-CM | POA: Diagnosis present

## 2019-07-05 DIAGNOSIS — F0390 Unspecified dementia without behavioral disturbance: Secondary | ICD-10-CM

## 2019-07-05 DIAGNOSIS — R634 Abnormal weight loss: Secondary | ICD-10-CM | POA: Diagnosis present

## 2019-07-05 DIAGNOSIS — E785 Hyperlipidemia, unspecified: Secondary | ICD-10-CM | POA: Diagnosis present

## 2019-07-05 DIAGNOSIS — N179 Acute kidney failure, unspecified: Secondary | ICD-10-CM

## 2019-07-05 DIAGNOSIS — C155 Malignant neoplasm of lower third of esophagus: Secondary | ICD-10-CM

## 2019-07-05 DIAGNOSIS — Z6828 Body mass index (BMI) 28.0-28.9, adult: Secondary | ICD-10-CM | POA: Diagnosis not present

## 2019-07-05 DIAGNOSIS — J449 Chronic obstructive pulmonary disease, unspecified: Secondary | ICD-10-CM | POA: Diagnosis present

## 2019-07-05 DIAGNOSIS — Z7289 Other problems related to lifestyle: Secondary | ICD-10-CM | POA: Diagnosis not present

## 2019-07-05 DIAGNOSIS — E559 Vitamin D deficiency, unspecified: Secondary | ICD-10-CM | POA: Diagnosis present

## 2019-07-05 DIAGNOSIS — K228 Other specified diseases of esophagus: Secondary | ICD-10-CM | POA: Insufficient documentation

## 2019-07-05 DIAGNOSIS — E441 Mild protein-calorie malnutrition: Secondary | ICD-10-CM | POA: Diagnosis present

## 2019-07-05 DIAGNOSIS — F1290 Cannabis use, unspecified, uncomplicated: Secondary | ICD-10-CM | POA: Diagnosis present

## 2019-07-05 DIAGNOSIS — Z515 Encounter for palliative care: Secondary | ICD-10-CM | POA: Diagnosis not present

## 2019-07-05 DIAGNOSIS — N400 Enlarged prostate without lower urinary tract symptoms: Secondary | ICD-10-CM | POA: Diagnosis present

## 2019-07-05 DIAGNOSIS — Z7189 Other specified counseling: Secondary | ICD-10-CM | POA: Diagnosis not present

## 2019-07-05 DIAGNOSIS — I10 Essential (primary) hypertension: Secondary | ICD-10-CM | POA: Diagnosis present

## 2019-07-05 DIAGNOSIS — R590 Localized enlarged lymph nodes: Secondary | ICD-10-CM | POA: Diagnosis present

## 2019-07-05 LAB — CBC
HCT: 40.6 % (ref 39.0–52.0)
Hemoglobin: 13.2 g/dL (ref 13.0–17.0)
MCH: 30 pg (ref 26.0–34.0)
MCHC: 32.5 g/dL (ref 30.0–36.0)
MCV: 92.3 fL (ref 80.0–100.0)
Platelets: 285 10*3/uL (ref 150–400)
RBC: 4.4 MIL/uL (ref 4.22–5.81)
RDW: 11.9 % (ref 11.5–15.5)
WBC: 9.8 10*3/uL (ref 4.0–10.5)
nRBC: 0 % (ref 0.0–0.2)

## 2019-07-05 LAB — COMPREHENSIVE METABOLIC PANEL
ALT: 15 U/L (ref 0–44)
AST: 16 U/L (ref 15–41)
Albumin: 3.8 g/dL (ref 3.5–5.0)
Alkaline Phosphatase: 54 U/L (ref 38–126)
Anion gap: 11 (ref 5–15)
BUN: 16 mg/dL (ref 8–23)
CO2: 25 mmol/L (ref 22–32)
Calcium: 10.1 mg/dL (ref 8.9–10.3)
Chloride: 103 mmol/L (ref 98–111)
Creatinine, Ser: 1.27 mg/dL — ABNORMAL HIGH (ref 0.61–1.24)
GFR calc Af Amer: >60 mL/min (ref >60)
GFR calc non Af Amer: 56 mL/min — ABNORMAL LOW (ref >60)
Glucose, Bld: 104 mg/dL — ABNORMAL HIGH (ref 70–99)
Potassium: 3.8 mmol/L (ref 3.5–5.1)
Sodium: 139 mmol/L (ref 135–145)
Total Bilirubin: 1.1 mg/dL (ref 0.3–1.2)
Total Protein: 6.8 g/dL (ref 6.5–8.1)

## 2019-07-05 LAB — PROTIME-INR
INR: 1 (ref 0.8–1.2)
Prothrombin Time: 13.5 seconds (ref 11.4–15.2)

## 2019-07-05 LAB — GLUCOSE, CAPILLARY: Glucose-Capillary: 118 mg/dL — ABNORMAL HIGH (ref 70–99)

## 2019-07-05 MED ORDER — MEMANTINE HCL 10 MG PO TABS
10.0000 mg | ORAL_TABLET | Freq: Two times a day (BID) | ORAL | Status: DC
  Administered 2019-07-05 – 2019-07-12 (×13): 10 mg via ORAL
  Filled 2019-07-05 (×13): qty 1

## 2019-07-05 MED ORDER — SIMVASTATIN 40 MG PO TABS
40.0000 mg | ORAL_TABLET | Freq: Every evening | ORAL | Status: DC
  Administered 2019-07-06 – 2019-07-11 (×6): 40 mg via ORAL
  Filled 2019-07-05 (×6): qty 1

## 2019-07-05 MED ORDER — BUPROPION HCL ER (XL) 150 MG PO TB24
150.0000 mg | ORAL_TABLET | Freq: Every day | ORAL | Status: DC
  Administered 2019-07-06 – 2019-07-12 (×6): 150 mg via ORAL
  Filled 2019-07-05 (×6): qty 1

## 2019-07-05 MED ORDER — TAMSULOSIN HCL 0.4 MG PO CAPS
0.4000 mg | ORAL_CAPSULE | Freq: Every day | ORAL | Status: DC
  Administered 2019-07-06 – 2019-07-12 (×6): 0.4 mg via ORAL
  Filled 2019-07-05 (×6): qty 1

## 2019-07-05 MED ORDER — FLUDEOXYGLUCOSE F - 18 (FDG) INJECTION
11.0100 | Freq: Once | INTRAVENOUS | Status: AC | PRN
  Administered 2019-07-05: 11.01 via INTRAVENOUS

## 2019-07-05 MED ORDER — DONEPEZIL HCL 5 MG PO TABS
5.0000 mg | ORAL_TABLET | Freq: Every day | ORAL | Status: DC
  Administered 2019-07-05 – 2019-07-11 (×7): 5 mg via ORAL
  Filled 2019-07-05 (×7): qty 1

## 2019-07-05 MED ORDER — SODIUM CHLORIDE 0.9 % IV SOLN
Freq: Once | INTRAVENOUS | Status: AC
  Filled 2019-07-05: qty 250

## 2019-07-05 MED ORDER — VITAMIN B-12 1000 MCG PO TABS
1000.0000 ug | ORAL_TABLET | Freq: Every day | ORAL | Status: DC
  Administered 2019-07-06 – 2019-07-12 (×6): 1000 ug via ORAL
  Filled 2019-07-05 (×6): qty 1

## 2019-07-05 MED ORDER — LORAZEPAM 0.5 MG PO TABS
0.5000 mg | ORAL_TABLET | Freq: Four times a day (QID) | ORAL | Status: DC | PRN
  Administered 2019-07-09 – 2019-07-10 (×2): 0.5 mg via ORAL
  Filled 2019-07-05 (×2): qty 1

## 2019-07-05 MED ORDER — VITAMIN D3 25 MCG (1000 UNIT) PO TABS
2000.0000 [IU] | ORAL_TABLET | Freq: Every day | ORAL | Status: DC
  Administered 2019-07-06 – 2019-07-12 (×6): 2000 [IU] via ORAL
  Filled 2019-07-05 (×6): qty 2

## 2019-07-05 MED ORDER — OLANZAPINE 5 MG PO TABS
5.0000 mg | ORAL_TABLET | Freq: Every day | ORAL | Status: DC
  Administered 2019-07-05 – 2019-07-11 (×7): 5 mg via ORAL
  Filled 2019-07-05 (×8): qty 1

## 2019-07-05 MED ORDER — SODIUM CHLORIDE 0.9 % IV SOLN: INTRAVENOUS | Status: DC

## 2019-07-05 MED ORDER — ESCITALOPRAM OXALATE 20 MG PO TABS
20.0000 mg | ORAL_TABLET | Freq: Every day | ORAL | Status: DC
  Administered 2019-07-06 – 2019-07-12 (×6): 20 mg via ORAL
  Filled 2019-07-05 (×6): qty 1

## 2019-07-05 NOTE — Progress Notes (Addendum)
[place] OFFICE PROGRESS NOTE   Diagnosis: Esophagus mass  INTERVAL HISTORY:   David [name] returns as scheduled.  He is accompanied by David [name].  He was unable to tolerate pudding last night.  He is tolerating sips of water.  He denies pain.  Objective:  Vital signs in last 24 hours:  Blood pressure 130/77, pulse 85, temperature 97.8 F (36.6 C), temperature source Temporal, resp. rate 18, height _0  (1.88 m), weight 222 lb (100.7 kg), SpO2 99 %.    HEENT: No thrush or ulcers.  Mucous membranes appear moist. Resp: Lungs clear bilaterally. Cardio: Regular rate and rhythm. GI: Abdomen soft and nontender. Vascular: No leg edema.  Skin: Skin turgor intact.   Lab Results:  Lab Results  Component Value Date   WBC 7.9 05/06/2019   HGB 14.8 05/06/2019   HCT 45.4 05/06/2019   MCV 94.2 05/06/2019   PLT 202 05/06/2019    Imaging:  No results found.  Medications: I have reviewed the patient's current medications.  Assessment/Plan: 1. Esophagus cancer ? CT 05/06/2019-circumferential thickening of the distal esophagus, mild splenomegaly, bladder wall thickening, enlarged prostate ? Upper endoscopy 06/11/2019-ulcerated mass at the lower esophagus, 36 cm from the incisors, partially obstructing, biopsy revealed atypical cells-reactive change versus poorly differentiated neoplasm, outside consultation at [facility] on this biopsy and a repeat biopsy 06/21/2019 confirmed a poorly differentiated malignant neoplasm ? Repeat endoscopy 06/21/2019-partially obstructing esophageal tumor found in the lower third of the esophagus ? PET scan 07/05/2019-large esophageal mass with nodal involvement both above and below the diaphragm 2. Dementia 3. Anxiety/depression 4. Sleep apnea 5. BPH 6. Dysphagia secondary to #1 7. Hyperlipidemia  Disposition: David [name] appears to have advanced stage esophagus cancer based on the PET scan from today.  David [name] reviewed the  report/images with him and David [name].  Potential treatment options to include concurrent chemotherapy and radiation versus radiation alone were discussed with a decision to be made at a later date.  At present he is unable to maintain adequate hydration/nutrition orally.  He attempted to sip liquids in the exam room and was unable to tolerate this.  We discussed placement of a feeding tube.  He and David [name] agree with this plan.  Arrangements are being made for hospital admission for IV hydration and placement of a feeding tube.  Patient seen with David [name].    Ned Card ANP/GNP-BC   07/05/2019  11:56 AM  David [name] appears unchanged though he is unable to tolerate liquids at present.  He vomited while in the examination room today.  We reviewed the PET images with David [name] and David [name].  There is a hypermetabolic esophagus mass, paratracheal, upper abdominal, and retroperitoneal lymph nodes.  He appears to have stage IV disease based on a hypermetabolic retroperitoneal node.  I discussed the prognosis and treatment options with David [name] and David [name].  They understand and is unlikely any therapy will be curative based on the extent of lymph node involvement.  We discussed a palliative approach with single modality radiation or chemotherapy.  We also discussed combined chemotherapy/radiation in an attempt to achieve a durable remission.  We will consider these options over the next few weeks.  I contacted pathology to request PD-L1 testing on the esophagus biopsy.  He is unable to maintain David hydration and nutrition at present.  He will be admitted for supportive care measures and to arrange for feeding tube placement.    Please call oncology  as needed over the weekend.  I will check on him 07/08/2019.

## 2019-07-05 NOTE — Telephone Encounter (Signed)
[place] CSW Progress Notes  Call to [place] Derise to complete psychosocial assessment, she and patient were with oncologist, she will call us back at her convenience.  Edwyna Shell, LCSW Clinical Social Worker Phone:  [phone] Cell:  [phone]

## 2019-07-05 NOTE — H&P (Signed)
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 male with medical history significant for esophageal cancer diagnosed 04/2019, dementia, COPD, CKD stage III, OSA intolerable of CPAP, hypertension and depression/anxiety who presents as a direct admission from outpatient oncology office for concerns of worsening dysphagia.  Patient presented to his oncologist today for discussion of treatment plan but was noted to have worsening dysphagia with concerns of being able to maintain good nutritional status and plan were made for him to be admitted for arrangement of a feeding tube and IV fluid hydration.  Patient reports that he can only tolerate food after he has masticated liquid and even then he might sometimes vomit due to nausea.  Currently, he denies any pain.  No chest pain, shortness of breath.  No nausea, vomiting or diarrhea.  He reports marijuana use in the past.  Denies any alcohol or tobacco use.  On arrival to the unit, he was afebrile and was well-appearing.  Review of Systems: Constitutional: No Weight Change, No Fever ENT/Mouth: No sore throat, No Rhinorrhea Eyes: No Eye Pain, No Vision Changes Cardiovascular: No Chest Pain, no SOB Respiratory: No Cough, No Sputum, No Wheezing, no Dyspnea  Gastrointestinal: No Nausea, No Vomiting, No Diarrhea, No Constipation, No Pain Genitourinary: no Urinary Incontinence, Musculoskeletal: No Arthralgias, No Myalgias Skin: No Skin Lesions, No Pruritus, Neuro: no Weakness, No Numbness,  Psych: No Anxiety/Panic, No Depression, no decrease appetite Heme/Lymph: No Bruising,  No Bleeding  Past Medical History:  Diagnosis Date  . Anxiety   . BPH (benign prostatic hyperplasia)   . Dementia ([place])   . Depression   . Hyperlipidemia   . Hypertension   . Kidney stone   . Memory loss   . 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 Sleep apnea    CPAP  . Vitamin D deficiency     Past Surgical History:  Procedure Laterality Date  . CATARACT EXTRACTION, BILATERAL  2018  . EXTERNAL EAR SURGERY     child  . EYE SURGERY Bilateral    cataract  . right knee meniscus  06/12/2008  . right talus repair     dislocated     reports that he has never smoked. He has never used smokeless tobacco. He reports current alcohol use of about 1.0 - 2.0 standard drinks of alcohol per week. He reports current drug use. Drug: Marijuana.  Allergies  Allergen Reactions  . Other     Other reaction(s): Other Sleep walk   . Abilify [Aripiprazole] Other (See Comments)    Short term memory loss  . Lamictal [Lamotrigine]     Increased agitation  . Wellbutrin [Bupropion] Anxiety    Patient does not recall allergy type Anxiety, confusion per Dr Edwin Dada note    Family History  Problem Relation Age of Onset  . Alzheimer's disease Mother   . Depression Mother   . Cancer Father        prostate  . Thyroid disease Father   . Alzheimer's disease Maternal Grandmother   . Heart disease Maternal Grandfather   . Stroke Paternal Grandmother   . Stroke Paternal Grandfather   .  Alcoholism Brother   . Autism Daughter   . Autism Maternal Uncle      Prior to Admission medications   Medication Sig Start Date End Date Taking? Authorizing Provider  buPROPion (WELLBUTRIN XL) 150 MG 24 hr tablet TAKE 1 TABLET ONCE DAILY. 06/28/19  Yes [name], PMHNP  Cholecalciferol (VITAMIN D3 PO) Take 2,000 Units by mouth daily.   Yes [provider]  donepezil (ARICEPT) 5 MG tablet TAKE ONE TABLET AT BEDTIME. 06/28/19  Yes [name], PMHNP  escitalopram (LEXAPRO) 20 MG  tablet TAKE 1 TABLET ONCE DAILY. 06/28/19  Yes [name], PMHNP  LORazepam (ATIVAN) 0.5 MG tablet TAKE ONE TABLET AT BEDTIME. Patient taking differently: Take 0.5 mg by mouth every 6 (six) hours as needed for anxiety.  06/28/19  Yes [name], PMHNP  memantine (NAMENDA) 10 MG tablet TAKE 1 TABLET BY MOUTH TWICE DAILY. 06/28/19  Yes [name], PMHNP  OLANZapine (ZYPREXA) 5 MG tablet TAKE ONE TABLET AT BEDTIME. 06/28/19  Yes [name], PMHNP  simvastatin (ZOCOR) 40 MG tablet Take 40 mg by mouth every evening.   Yes [provider]  Tamsulosin HCl (FLOMAX) 0.4 MG CAPS Take 0.4 mg by mouth daily.    Yes [provider]  vitamin B-12 (CYANOCOBALAMIN) 1000 MCG tablet Take 1,000 mcg by mouth daily.   Yes [provider]  sucralfate (CARAFATE) 1 g tablet Take 1 tablet (1 g total) by mouth 4 (four) times daily -  with meals and at bedtime. 5 min before meals for radiation induced esophagitis 07/04/19   Hayden Pedro, PA-C    Physical Exam: There were no vitals filed for this visit.  Constitutional: NAD, calm, comfortable, elderly gentleman sitting upright in recliner chair There were no vitals filed for this visit. Eyes: PERRL, lids and conjunctivae normal ENMT: Mucous membranes are moist. Neck: normal, supple Respiratory: clear to auscultation bilaterally, no wheezing, no crackles. Normal respiratory effort on room air. No accessory muscle use.  Cardiovascular: Regular rate and rhythm, no murmurs / rubs / gallops. No extremity edema. 2+ pedal pulses.  Abdomen: no tenderness, no masses palpated.  Bowel sounds positive.  Musculoskeletal: no clubbing / cyanosis. No joint deformity upper and lower extremities. Good ROM, no contractures. Normal muscle tone.  Skin: no rashes, lesions, ulcers. No induration Neurologic: CN 2-12 grossly intact. Sensation intact. Strength 5/5 in all 4. Resting tremor of bilateral hands.  Psychiatric: Normal judgment and  insight. Alert and oriented x 3. Normal mood.     Labs on Admission: I have personally reviewed following labs and imaging studies  CBC: No results for input(s): WBC, NEUTROABS, HGB, HCT, MCV, PLT in the last 168 hours. Basic Metabolic Panel: No results for input(s): NA, K, CL, CO2, GLUCOSE, BUN, CREATININE, CALCIUM, MG, PHOS in the last 168 hours. GFR: CrCl cannot be calculated (Patient's most recent lab result is older than the maximum 21 days allowed.). Liver Function Tests: No results for input(s): AST, ALT, ALKPHOS, BILITOT, PROT, ALBUMIN in the last 168 hours. No results for input(s): LIPASE, AMYLASE in the last 168 hours. No results for input(s): AMMONIA in the last 168 hours. Coagulation Profile: No results for input(s): INR, PROTIME in the last 168 hours. Cardiac Enzymes: No results for input(s): CKTOTAL, CKMB, CKMBINDEX, TROPONINI in the last 168 hours. BNP (last 3 results) No results for input(s): PROBNP in the last 8760 hours. HbA1C: No results for input(s): HGBA1C in the last 72 hours. CBG: Recent Labs  Lab 07/05/19 [phone]  GLUCAP 118*   Lipid Profile: No results for input(s): CHOL, HDL, LDLCALC, TRIG, CHOLHDL, LDLDIRECT in the last 72 hours. Thyroid Function Tests: No results for input(s): TSH, T4TOTAL, FREET4, T3FREE, THYROIDAB in the last 72 hours. Anemia Panel: No results for input(s): VITAMINB12, FOLATE, FERRITIN, TIBC, IRON, RETICCTPCT in the last 72 hours. Urine analysis:    Component Value Date/Time   COLORURINE YELLOW 05/06/2019 2159   APPEARANCEUR CLEAR 05/06/2019 2159   LABSPEC 1.027 05/06/2019 2159   PHURINE 5.0 05/06/2019 2159   GLUCOSEU NEGATIVE 05/06/2019 2159   HGBUR NEGATIVE 05/06/2019 2159   BILIRUBINUR NEGATIVE 05/06/2019 2159   KETONESUR 5 (A) 05/06/2019 2159   PROTEINUR NEGATIVE 05/06/2019 2159   NITRITE NEGATIVE 05/06/2019 2159   LEUKOCYTESUR NEGATIVE 05/06/2019 2159    Radiological Exams on Admission: NM PET Image Initial (PI)  Skull Base To Thigh  Result Date: 07/05/2019 CLINICAL DATA:  Initial treatment strategy for esophageal cancer. EXAM: NUCLEAR MEDICINE PET SKULL BASE TO THIGH TECHNIQUE: 11.01 mCi F-18 FDG was injected intravenously. Full-ring PET imaging was performed from the skull base to thigh after the radiotracer. CT data was obtained and used for attenuation correction and anatomic localization. Fasting blood glucose: 118 mg/dl COMPARISON:  CT abdomen and pelvis 05/06/2019 FINDINGS: Mediastinal blood pool activity: SUV max 2.81 Liver activity: SUV max NA NECK: No hypermetabolic lymph nodes in the neck. Incidental CT findings: none CHEST: Large mass in the distal esophagus with marked increased FDG uptake. Masslike thickening is circumferential (SUVmax = 24.6) (image 103, series 3 and series 4) wall thickening as much as 3.6 cm. In total the esophagus at this level measures 7.5 x 5.2 cm. Multiple mediastinal lymph nodes beginning from just below the thoracic inlet also with lymph nodes in the retrocrural region and below the diaphragm. (Image 59, series 3): High right paratracheal lymph node just below the thoracic inlet with rounded borders measuring 2.0 cm. (SUVmax = 28.1) Tiny lymph node just anterior to the aorta (image 91, series 4) difficult to visualize on CT (SUVmax = 7.1) Juxta aortic lymph node just to the left of long the anterior margin of the aorta (image 96, series 4): (SUVmax = 11 0.0) Small discrete foci of increased FDG uptake at the level of the esophageal hiatus, these appear distinct from the dominant mass, a small hepatic gastric lymph node (image 121, series 4) likely corresponds to the area of uptake seen just anterior to the aorta, perhaps with some variable respiration there is misregistration on the current study (SUVmax = 13.6 No area of increased FDG uptake within the lung parenchyma) Incidental CT findings: Lungs are clear aside from minimal scarring in the anterior right chest. No pleural  effusion. Airways are patent. Calcified atheromatous plaque throughout the thoracic aorta. Calcified coronary artery disease. ABDOMEN/PELVIS: Left para-aortic lymph node with intense FDG uptake (image 135, series 4) 10 mm (SUVmax = 20.1) hepato gastric lymph node as described. No areas of suspicious uptake within solid organs. There is an 8 mm right juxta crural lymph node (image 120, series 4) with similar uptake to the uptake seen in the hepato gastric lymph node. Incidental CT findings: CT appearance of liver, gallbladder, spleen, pancreas and adrenal glands is normal. Nonobstructing calculus in the upper pole the right kidney. No evidence of hydronephrosis. Left ureteral calculus measures 7 mm in the distal left ureter and is not associated with hydronephrosis, unchanged compared to previous imaging. No acute gastrointestinal finding. Appendix is normal. Stool fills much of the colon. SKELETON: No  focal hypermetabolic activity to suggest skeletal metastasis. Incidental CT findings: Spinal degenerative changes. IMPRESSION: 1. Signs of esophageal cancer with large esophageal mass with nodal involvement both above and below the diaphragm. 2. 7 mm distal left ureteral calculus is unchanged and does not lead to urinary tract obstruction. Electronically Signed   By: Zetta Bills M.D.   On: 07/05/2019 11:55     Assessment/Plan Progressive worsening dysphagia in the setting of advanced stage esophageal cancer Continue with IV 75 cc fluid SLP evaluation N.p.o. past midnight Will need to consult general surgery in the morning to arrange for PEG tube placement  Acute on chronic kidney injury  Creatinine of 1.27 from baseline normal Monitor after fluids Avoid nephrotoxic agents   Dementia Patient alert and oriented x4 at baseline.  Appears to have issues with sundowning. Continue Aricept, memantine, Zyprexa   Depression/anxiety Continue Wellbutrin, Lexapro Continue as needed Ativan  OSA    intolerable of CPAP  DVT prophylaxis: SCDS Code Status: Full Family Communication: Plan discussed with patient and wife at bedside  disposition Plan: Home with at least 2 midnight stays  Consults called:  Admission status: inpatient   Lydell Moga T Shaketa Serafin DO [facility]   If 7PM-7AM, please contact night-coverage [email or web address]   07/05/2019, 7:35 PM

## 2019-07-05 NOTE — Telephone Encounter (Signed)
Contacted patient by phone however, he was not available. I could not leave a message because the voice mail box was full.

## 2019-07-05 NOTE — Progress Notes (Signed)
Met with patient and his wife David [name] at their initial medical oncology appointment today with Dr. Benay Spice.  I had spoken to his wife David [name] previously and she and patient understand my role as GI nurse navigator.  She was given my business card with my direct phone number and encouraged to call with any questions or concerns through this process.  At the end of their visit they had an understanding of the plan to proceed with PET scan on 3/26; see Dr. Lisbeth Renshaw Radiation Oncologist hopefully tomorrow, have a nutritionist referral to address his poor intake and weight loss and a referral to Dr. Servando Snare for surgical consult.  Appropriate referrals as mentioned were made.  We will see the patient back after his PET scan to discuss the findings.

## 2019-07-05 NOTE — Progress Notes (Signed)
Patient is being admitted to room 1515. Report called to pt's assigned nurse. Peripheral IV capped.  Transported via w/c. TCT patient's wife and left her a vm message regarding pt's room assignment and covid testing procedure. She cannot visit until after covid test done and results known. Provided phone # to [phone] to her.

## 2019-07-06 LAB — CBC
HCT: 40.3 % (ref 39.0–52.0)
Hemoglobin: 13 g/dL (ref 13.0–17.0)
MCH: 29.7 pg (ref 26.0–34.0)
MCHC: 32.3 g/dL (ref 30.0–36.0)
MCV: 92.2 fL (ref 80.0–100.0)
Platelets: 252 10*3/uL (ref 150–400)
RBC: 4.37 MIL/uL (ref 4.22–5.81)
RDW: 12 % (ref 11.5–15.5)
WBC: 8.2 10*3/uL (ref 4.0–10.5)
nRBC: 0 % (ref 0.0–0.2)

## 2019-07-06 LAB — SARS CORONAVIRUS 2 (TAT 6-24 HRS): SARS Coronavirus 2: NEGATIVE

## 2019-07-06 LAB — BASIC METABOLIC PANEL
Anion gap: 10 (ref 5–15)
BUN: 14 mg/dL (ref 8–23)
CO2: 24 mmol/L (ref 22–32)
Calcium: 9.9 mg/dL (ref 8.9–10.3)
Chloride: 107 mmol/L (ref 98–111)
Creatinine, Ser: 1.22 mg/dL (ref 0.61–1.24)
GFR calc Af Amer: >60 mL/min (ref >60)
GFR calc non Af Amer: 59 mL/min — ABNORMAL LOW (ref >60)
Glucose, Bld: 103 mg/dL — ABNORMAL HIGH (ref 70–99)
Potassium: 3.8 mmol/L (ref 3.5–5.1)
Sodium: 141 mmol/L (ref 135–145)

## 2019-07-06 MED ORDER — CEFAZOLIN SODIUM-DEXTROSE 2-4 GM/100ML-% IV SOLN: 2.0000 g | INTRAVENOUS | Status: AC

## 2019-07-06 NOTE — Evaluation (Signed)
Clinical/Bedside Swallow Evaluation Patient Details  Name: David [name] MRN: [id number] Date of Birth: 1945/05/19  Today's Date: 07/06/2019 Time: SLP Start Time (ACUTE ONLY): 1723 SLP Stop Time (ACUTE ONLY): 1750 SLP Time Calculation (min) (ACUTE ONLY): 27 min  Past Medical History:  Past Medical History:  Diagnosis Date  . Anxiety   . BPH (benign prostatic hyperplasia)   . Dementia ([place])   . Depression   . Hyperlipidemia   . Hypertension   . Kidney stone   . Memory loss   . 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 Sleep apnea    CPAP  . Vitamin D deficiency    Past Surgical History:  Past Surgical History:  Procedure Laterality Date  . CATARACT EXTRACTION, BILATERAL  2018  . EXTERNAL EAR SURGERY     child  . EYE SURGERY Bilateral    cataract  . right knee meniscus  06/12/2008  . right talus repair     dislocated   HPI:  David [name] is a 74 y.o. male with medical history significant for esophageal cancer diagnosed 04/2019, dementia, COPD, CKD stage III, OSA intolerable of CPAP, hypertension and depression/anxiety who presents as a direct admission from outpatient oncology office for concerns of worsening dysphagia. Pt was placed on a clear liquid diet on 3/27 with plans for a PEG on 3/29.     Assessment / Plan / Recommendation Clinical Impression  Pt was seen for a bedside swallow evaluation and he presents with suspected esophageal dysphagia.  He was awake/alert and agreeable to this evaluation.  Pt is currently on a clear liquid diet and is scheduled for a PEG placement on 3/29 secondary to increased dysphagia in the setting of esophageal cancer.  Pt reported that he generally tolerates clear liquid and full liquid diets without difficulty.  Oral mechanism exam was WNL.  Pt consumed trials of thin liquid and puree.  He demonstrated good bolus acceptance and suspected timely AP transport and swallow initiation.  A delayed throat clear was observed following  2/7 trials of thin liquid.  Suspect that this is related to esophageal dysfunction vs pharyngeal dysphagia.  Pt is likely at the greatest risk for aspiration post-prandially secondary to esophageal dysfunction.   No overt s/sx of aspiration were observed with pureed solids.  Spoke with RN regarding pt's desire to upgrade to a full liquid diet with recommendations for her to monitor him for at least 30 minutes following consumption of puree in this evaluation in order to check for regurgitation, emesis, etc.  She verbalized understanding.  Recommend continuation of a clear liquid diet or upgrade to full liquid diet if pt tolerates and it aligns with MD plan.  SLP will briefly f/u after PEG placement to evalaute diet tolerance.   SLP Visit Diagnosis: Dysphagia, unspecified (R13.10)    Aspiration Risk  Mild aspiration risk    Diet Recommendation Thin liquid(clear vs full liquid diet )   Liquid Administration via: Cup;Straw Medication Administration: Whole meds with liquid Supervision: Patient able to self feed;Intermittent supervision to cue for compensatory strategies Compensations: Slow rate;Small sips/bites;Minimize environmental distractions Postural Changes: Seated upright at 90 degrees;Remain upright for at least 30 minutes after po intake    Other  Recommendations Oral Care Recommendations: Oral care BID   Follow up Recommendations Other (comment)(TBD)      Frequency and Duration min 1 x/week  2 weeks       Prognosis Prognosis for Safe Diet Advancement: Fair Barriers to Reach Goals: Severity of  deficits      Swallow Study   General Date of Onset: 07/05/19 HPI: David [name] is a 74 y.o. male with medical history significant for esophageal cancer diagnosed 04/2019, dementia, COPD, CKD stage III, OSA intolerable of CPAP, hypertension and depression/anxiety who presents as a direct admission from outpatient oncology office for concerns of worsening dysphagia. Pt was placed on a  clear liquid diet on 3/27 with plans for a PEG on 3/29.   Type of Study: Bedside Swallow Evaluation Previous Swallow Assessment: None Diet Prior to this Study: Thin liquids Temperature Spikes Noted: No Respiratory Status: Room air History of Recent Intubation: No Behavior/Cognition: Alert;Cooperative;Pleasant mood Oral Cavity Assessment: Within Functional Limits Oral Care Completed by SLP: No Oral Cavity - Dentition: Adequate natural dentition Vision: Functional for self-feeding Self-Feeding Abilities: Able to feed self;Needs set up Patient Positioning: Upright in bed Baseline Vocal Quality: Low vocal intensity Volitional Swallow: Able to elicit    Oral/Motor/Sensory Function Overall Oral Motor/Sensory Function: Within functional limits   Ice Chips Ice chips: Not tested   Thin Liquid Thin Liquid: Impaired Presentation: Cup;Straw Pharyngeal  Phase Impairments: Throat Clearing - Delayed    Nectar Thick Nectar Thick Liquid: Not tested   Honey Thick Honey Thick Liquid: Not tested   Puree Puree: Within functional limits   Solid     Solid: Not tested     Colin Mulders M.S., [place] Office: [phone]  [place] 07/06/2019,6:00 PM

## 2019-07-06 NOTE — Progress Notes (Signed)
PROGRESS NOTE    David [name]  UYQ:[id number] DOB: 05/08/1945 DOA: 07/05/2019 PCP: Leighton Ruff, MD  Brief Narrative: 74 y.o. male with medical history significant for esophageal cancer diagnosed 04/2019, dementia, COPD, CKD stage III, OSA intolerable of CPAP, hypertension and depression/anxiety who presents as a direct admission from outpatient oncology office for concerns of worsening dysphagia.  Patient presented to his oncologist today for discussion of treatment plan but was noted to have worsening dysphagia with concerns of being able to maintain good nutritional status and plan were made for him to be admitted for arrangement of a feeding tube and IV fluid hydration.  Patient reports that he can only tolerate food after he has masticated liquid and even then he might sometimes vomit due to nausea.  Currently, he denies any pain.  No chest pain, shortness of breath.  No nausea, vomiting or diarrhea.  He reports marijuana use in the past.  Denies any alcohol or tobacco use.  Assessment & Plan:   Principal Problem:   Dysphagia Active Problems:   Major depressive disorder, recurrent episode, severe (HCC)   Generalized anxiety disorder   Dementia without behavioral disturbance (HCC)   AKI (acute kidney injury) ([place])   Esophageal cancer ([place])    #1 dysphagia-patient with history of advanced esophageal cancer with gradually worsening dysphagia.  He is admitted for PEG tube placement.  I have consulted interventional radiology.  Patient is n.p.o. on IV fluids.  He is not able to tolerate any p.o. intake. CT scan May 06, 2019 shows circumferential thickening of the distal esophagus with mild splenomegaly bladder wall thickening and enlarged prostate. Upper endoscopy 06/11/2019 shows ulcerated mass of the lower esophagus partially obstructing biopsy confirmed as poorly differentiated malignant neoplasm. Repeat endoscopy 06/21/2019 shows partially obstructing esophageal tumor at  the lower third of the esophagus. PET scan 07/05/2019 shows large esophageal mass with nodal involvement both above and below the diaphragm Covid negative  #2 AKI creatinine on admission 1.27 down to 1.22 with IV fluids  #3 history of dementia on Aricept Namenda and Zyprexa  #4 history of depression and anxiety on Lexapro and Wellbutrin and as needed Ativan  #5 obstructive sleep apnea patient is intolerant to CPAP  #6 benign prostatic hypertrophy on Flomax  #7 hyperlipidemia on zocor   Estimated body mass index is 28.5 kg/m as calculated from the following:   Height as of an earlier encounter on 07/05/19: 6\' 2"  (1.88 m).   Weight as of an earlier encounter on 07/05/19: 100.7 kg.  DVT prophylaxis: SCD Code Status: Full code Family Communication: Called his wife with no response Disposition Plan: Patient came from home Plan is to discharge him home Barriers to discharge patient n.p.o. with dysphagia on IV fluids with progressive dysphagia and advanced esophageal cancer for PEG tube placement  Consultants:   Interventional radiology  Procedures: None Antimicrobials: None  Subjective: He is resting in bed he is awake and alert he has no new complaints he continues to complain of dysphagia.  Objective: Vitals:   07/05/19 2208 07/06/19 0133 07/06/19 0546  BP: 139/79 128/66 134/83  Pulse: 69 73 81  Resp: 20 18 17   Temp: 98.2 F (36.8 C) 98.6 F (37 C) 98.2 F (36.8 C)  TempSrc: Oral Oral Oral  SpO2: 99% 98% 96%   No intake or output data in the 24 hours ending 07/06/19 0851 There were no vitals filed for this visit.  Examination:  General exam: Appears calm and comfortable  Respiratory system: Clear  to auscultation. Respiratory effort normal. Cardiovascular system: S1 & S2 heard, RRR. No JVD, murmurs, rubs, gallops or clicks. No pedal edema. Gastrointestinal system: Abdomen is nondistended, soft and nontender. No organomegaly or masses felt. Normal bowel sounds  heard. Central nervous system: Alert and oriented. No focal neurological deficits. Extremities: Symmetric 5 x 5 power. Skin: No rashes, lesions or ulcers Psychiatry: Judgement and insight appear normal. Mood & affect appropriate.     Data Reviewed: I have personally reviewed following labs and imaging studies  CBC: Recent Labs  Lab 07/05/19 2037 07/06/19 0615  WBC 9.8 8.2  HGB 13.2 13.0  HCT 40.6 40.3  MCV 92.3 92.2  PLT 285 539   Basic Metabolic Panel: Recent Labs  Lab 07/05/19 2037 07/06/19 0615  NA 139 141  K 3.8 3.8  CL 103 107  CO2 25 24  GLUCOSE 104* 103*  BUN 16 14  CREATININE 1.27* 1.22  CALCIUM 10.1 9.9   GFR: Estimated Creatinine Clearance: 69.4 mL/min (by C-G formula based on SCr of 1.22 mg/dL). Liver Function Tests: Recent Labs  Lab 07/05/19 2037  AST 16  ALT 15  ALKPHOS 54  BILITOT 1.1  PROT 6.8  ALBUMIN 3.8   No results for input(s): LIPASE, AMYLASE in the last 168 hours. No results for input(s): AMMONIA in the last 168 hours. Coagulation Profile: Recent Labs  Lab 07/05/19 2037  INR 1.0   Cardiac Enzymes: No results for input(s): CKTOTAL, CKMB, CKMBINDEX, TROPONINI in the last 168 hours. BNP (last 3 results) No results for input(s): PROBNP in the last 8760 hours. HbA1C: No results for input(s): HGBA1C in the last 72 hours. CBG: Recent Labs  Lab 07/05/19 0947  GLUCAP 118*   Lipid Profile: No results for input(s): CHOL, HDL, LDLCALC, TRIG, CHOLHDL, LDLDIRECT in the last 72 hours. Thyroid Function Tests: No results for input(s): TSH, T4TOTAL, FREET4, T3FREE, THYROIDAB in the last 72 hours. Anemia Panel: No results for input(s): VITAMINB12, FOLATE, FERRITIN, TIBC, IRON, RETICCTPCT in the last 72 hours. Sepsis Labs: No results for input(s): PROCALCITON, LATICACIDVEN in the last 168 hours.  Recent Results (from the past 240 hour(s))  SARS CORONAVIRUS 2 (TAT 6-24 HRS) Nasopharyngeal Nasopharyngeal Swab     Status: None   Collection  Time: 07/05/19  8:28 PM   Specimen: Nasopharyngeal Swab  Result Value Ref Range Status   SARS Coronavirus 2 NEGATIVE NEGATIVE Final    Comment: (NOTE) SARS-CoV-2 target nucleic acids are NOT DETECTED. The SARS-CoV-2 RNA is generally detectable in upper and lower respiratory specimens during the acute phase of infection. Negative results do not preclude SARS-CoV-2 infection, do not rule out co-infections with other pathogens, and should not be used as the sole basis for treatment or other patient management decisions. Negative results must be combined with clinical observations, patient history, and epidemiological information. The expected result is Negative. Fact Sheet for Patients: SugarRoll.be Fact Sheet for Healthcare Providers: https://www.woods-.com/ This test is not yet approved or cleared by the [place] FDA and  has been authorized for detection and/or diagnosis of SARS-CoV-2 by FDA under an Emergency Use Authorization (EUA). This EUA will remain  in effect (meaning this test can be used) for the duration of the COVID-19 declaration under Section 56 4(b)(1) of the Act, 21 U.S.C. section 360bbb-3(b)(1), unless the authorization is terminated or revoked sooner. Performed at [facility], [place] [address] [place], [place] [address]          Radiology Studies: NM PET Image Initial (PI)  Skull Base To Thigh  Result Date: 07/05/2019 CLINICAL DATA:  Initial treatment strategy for esophageal cancer. EXAM: NUCLEAR MEDICINE PET SKULL BASE TO THIGH TECHNIQUE: 11.01 mCi F-18 FDG was injected intravenously. Full-ring PET imaging was performed from the skull base to thigh after the radiotracer. CT data was obtained and used for attenuation correction and anatomic localization. Fasting blood glucose: 118 mg/dl COMPARISON:  CT abdomen and pelvis 05/06/2019 FINDINGS: Mediastinal blood pool activity: SUV max 2.81 Liver activity: SUV  max NA NECK: No hypermetabolic lymph nodes in the neck. Incidental CT findings: none CHEST: Large mass in the distal esophagus with marked increased FDG uptake. Masslike thickening is circumferential (SUVmax = 24.6) (image 103, series 3 and series 4) wall thickening as much as 3.6 cm. In total the esophagus at this level measures 7.5 x 5.2 cm. Multiple mediastinal lymph nodes beginning from just below the thoracic inlet also with lymph nodes in the retrocrural region and below the diaphragm. (Image 59, series 3): High right paratracheal lymph node just below the thoracic inlet with rounded borders measuring 2.0 cm. (SUVmax = 28.1) Tiny lymph node just anterior to the aorta (image 91, series 4) difficult to visualize on CT (SUVmax = 7.1) Juxta aortic lymph node just to the left of long the anterior margin of the aorta (image 96, series 4): (SUVmax = 11 0.0) Small discrete foci of increased FDG uptake at the level of the esophageal hiatus, these appear distinct from the dominant mass, a small hepatic gastric lymph node (image 121, series 4) likely corresponds to the area of uptake seen just anterior to the aorta, perhaps with some variable respiration there is misregistration on the current study (SUVmax = 13.6 No area of increased FDG uptake within the lung parenchyma) Incidental CT findings: Lungs are clear aside from minimal scarring in the anterior right chest. No pleural effusion. Airways are patent. Calcified atheromatous plaque throughout the thoracic aorta. Calcified coronary artery disease. ABDOMEN/PELVIS: Left para-aortic lymph node with intense FDG uptake (image 135, series 4) 10 mm (SUVmax = 20.1) hepato gastric lymph node as described. No areas of suspicious uptake within solid organs. There is an 8 mm right juxta crural lymph node (image 120, series 4) with similar uptake to the uptake seen in the hepato gastric lymph node. Incidental CT findings: CT appearance of liver, gallbladder, spleen, pancreas and  adrenal glands is normal. Nonobstructing calculus in the upper pole the right kidney. No evidence of hydronephrosis. Left ureteral calculus measures 7 mm in the distal left ureter and is not associated with hydronephrosis, unchanged compared to previous imaging. No acute gastrointestinal finding. Appendix is normal. Stool fills much of the colon. SKELETON: No focal hypermetabolic activity to suggest skeletal metastasis. Incidental CT findings: Spinal degenerative changes. IMPRESSION: 1. Signs of esophageal cancer with large esophageal mass with nodal involvement both above and below the diaphragm. 2. 7 mm distal left ureteral calculus is unchanged and does not lead to urinary tract obstruction. Electronically Signed   By: Zetta Bills M.D.   On: 07/05/2019 11:55        Scheduled Meds: . buPROPion  150 mg Oral Daily  . cholecalciferol  2,000 Units Oral Daily  . donepezil  5 mg Oral QHS  . escitalopram  20 mg Oral Daily  . memantine  10 mg Oral BID  . OLANZapine  5 mg Oral QHS  . simvastatin  40 mg Oral QPM  . tamsulosin  0.4 mg Oral Daily  . vitamin B-12  1,000 mcg  Oral Daily   Continuous Infusions: . sodium chloride 75 mL/hr at 07/05/19 2024     LOS: 1 day     Georgette Shell, MD  07/06/2019, 8:51 AM

## 2019-07-06 NOTE — Consult Note (Signed)
Chief Complaint: Patient was seen in consultation today for percutaneous gastric tube placement at the request of Dr Kathreen Cosier   Supervising Physician: Aletta Edouard  Patient Status: [facility] - [facility]-pt  History of Present Illness: David [name] is a 74 y.o. male   Esophageal cancer Wt loss Dysphagia Unable to take much of anything by mouth  Request for percutaneous gastric tube placement  Dr Kathlene Cote has reviewed imaging and approves probable direct stick gastric tube placement in IR   Past Medical History:  Diagnosis Date  . Anxiety   . BPH (benign prostatic hyperplasia)   . Dementia ([place])   . Depression   . Hyperlipidemia   . Hypertension   . Kidney stone   . Memory loss   . 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 Sleep apnea    CPAP  . Vitamin D deficiency     Past Surgical History:  Procedure Laterality Date  . CATARACT EXTRACTION, BILATERAL  2018  . EXTERNAL EAR SURGERY     child  . EYE SURGERY Bilateral    cataract  . right knee meniscus  06/12/2008  . right talus repair     dislocated    Allergies: Other, Abilify [aripiprazole], Lamictal [lamotrigine], and Wellbutrin [bupropion]  Medications: Prior to Admission medications   Medication Sig Start Date End Date Taking? Authorizing Provider  buPROPion (WELLBUTRIN XL) 150 MG 24 hr tablet TAKE 1 TABLET ONCE DAILY. 06/28/19  Yes [name], PMHNP  Cholecalciferol (VITAMIN D3 PO) Take 2,000 Units by mouth daily.   Yes [provider]  donepezil (ARICEPT) 5 MG tablet TAKE ONE TABLET AT BEDTIME. 06/28/19  Yes [name], PMHNP  escitalopram (LEXAPRO) 20 MG tablet TAKE 1 TABLET ONCE DAILY. 06/28/19  Yes [name], PMHNP  LORazepam (ATIVAN) 0.5 MG tablet TAKE ONE TABLET AT BEDTIME. Patient taking differently: Take 0.5 mg by mouth every 6 (six) hours as needed for anxiety.  06/28/19  Yes [name], PMHNP  memantine (NAMENDA) 10 MG tablet TAKE 1 TABLET BY MOUTH TWICE  DAILY. 06/28/19  Yes [name], PMHNP  OLANZapine (ZYPREXA) 5 MG tablet TAKE ONE TABLET AT BEDTIME. 06/28/19  Yes [name], PMHNP  simvastatin (ZOCOR) 40 MG tablet Take 40 mg by mouth every evening.   Yes [provider]  Tamsulosin HCl (FLOMAX) 0.4 MG CAPS Take 0.4 mg by mouth daily.    Yes [provider]  vitamin B-12 (CYANOCOBALAMIN) 1000 MCG tablet Take 1,000 mcg by mouth daily.   Yes [provider]  sucralfate (CARAFATE) 1 g tablet Take 1 tablet (1 g total) by mouth 4 (four) times daily -  with meals and at bedtime. 5 min before meals for radiation induced esophagitis 07/04/19   Hayden Pedro, PA-C     Family History  Problem Relation Age of Onset  . Alzheimer's disease Mother   . Depression Mother   . Cancer Father        prostate  . Thyroid disease Father   . Alzheimer's disease Maternal Grandmother   . Heart disease Maternal Grandfather   . Stroke Paternal Grandmother   . Stroke Paternal Grandfather   . Alcoholism Brother   . Autism Daughter   . Autism Maternal Uncle     Social History   Socioeconomic History  . Marital status: Legally Separated    Spouse name: [name]  . Number of children: 2  . Years of education: 83  . 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 Smoking status: Never Smoker  . Smokeless tobacco: Never Used  Substance and Sexual Activity  . Alcohol use: Yes    Alcohol/week: 1.0 - 2.0 standard drinks    Types: 1 - 2 Shots of liquor per week    Comment: 1-2 shots 4-5 x week  . Drug use: Yes    Types: Marijuana    Comment: Occasional  . Sexual activity: Never  Other Topics Concern  . Not on file  Social History Narrative   Lives alone retired.  Education: BS, MS Phd chemist.  Separated 8 yrs a/o spring 2020.  Adult children son, out of home, and daughter, adult with autistic spectrum, lives with estranged wife.  2 gks.  Mother died at  age 15, of COVID, in the [place] outbreak -- emotionally destabilizing event late May/early June.        Cokes 0-3 daily as reported by nursing Sept 2019.  Gets medications via blister pack prepared by pharmacy a/o spring 2020, with generally good compliance but some mixing up of times and dates.   Social Determinants of Health   Financial Resource Strain:   . Difficulty of Paying Living Expenses:   Food Insecurity:   . Worried About Charity fundraiser in the Last Year:   . Arboriculturist in the Last Year:   Transportation Needs:   . Film/video editor (Medical):   Marland Kitchen Lack of Transportation (Non-Medical):   Physical Activity:   . Days of Exercise per Week:   . Minutes of Exercise per Session:   Stress:   . Feeling of Stress :   Social Connections:   . Frequency of Communication with Friends and Family:   . Frequency of Social Gatherings with Friends and Family:   . Attends Religious Services:   . Active Member of Clubs or Organizations:   . Attends Archivist Meetings:   Marland Kitchen Marital Status:     Review of Systems: A 12 point ROS discussed and pertinent positives are indicated in the HPI above.  All other systems are negative.  Review of Systems  Constitutional: Positive for activity change, appetite change, fatigue and unexpected weight change.  Respiratory: Negative for shortness of breath.   Gastrointestinal: Negative for abdominal pain.  Psychiatric/Behavioral: Negative for behavioral problems and confusion.    Vital Signs: BP 134/83 (BP Location: Right Arm)   Pulse 81   Temp 98.2 F (36.8 C) (Oral)   Resp 17   SpO2 96%   Physical Exam Vitals reviewed.  Cardiovascular:     Rate and Rhythm: Normal rate and regular rhythm.     Heart sounds: Normal heart sounds.  Pulmonary:     Effort: Pulmonary effort is normal.     Breath sounds: Normal breath sounds.  Abdominal:     Palpations: Abdomen is soft.  Musculoskeletal:        General: Normal range of  motion.  Skin:    General: Skin is warm and dry.  Neurological:     Mental Status: He is alert and oriented to person, place, and time.  Psychiatric:        Behavior: Behavior normal.        Thought Content: Thought content normal.        Judgment: Judgment normal.     Imaging: NM PET Image Initial (PI) Skull Base To Thigh  Result Date: 07/05/2019 CLINICAL DATA:  Initial treatment strategy for esophageal cancer.  EXAM: NUCLEAR MEDICINE PET SKULL BASE TO THIGH TECHNIQUE: 11.01 mCi F-18 FDG was injected intravenously. Full-ring PET imaging was performed from the skull base to thigh after the radiotracer. CT data was obtained and used for attenuation correction and anatomic localization. Fasting blood glucose: 118 mg/dl COMPARISON:  CT abdomen and pelvis 05/06/2019 FINDINGS: Mediastinal blood pool activity: SUV max 2.81 Liver activity: SUV max NA NECK: No hypermetabolic lymph nodes in the neck. Incidental CT findings: none CHEST: Large mass in the distal esophagus with marked increased FDG uptake. Masslike thickening is circumferential (SUVmax = 24.6) (image 103, series 3 and series 4) wall thickening as much as 3.6 cm. In total the esophagus at this level measures 7.5 x 5.2 cm. Multiple mediastinal lymph nodes beginning from just below the thoracic inlet also with lymph nodes in the retrocrural region and below the diaphragm. (Image 59, series 3): High right paratracheal lymph node just below the thoracic inlet with rounded borders measuring 2.0 cm. (SUVmax = 28.1) Tiny lymph node just anterior to the aorta (image 91, series 4) difficult to visualize on CT (SUVmax = 7.1) Juxta aortic lymph node just to the left of long the anterior margin of the aorta (image 96, series 4): (SUVmax = 11 0.0) Small discrete foci of increased FDG uptake at the level of the esophageal hiatus, these appear distinct from the dominant mass, a small hepatic gastric lymph node (image 121, series 4) likely corresponds to the area  of uptake seen just anterior to the aorta, perhaps with some variable respiration there is misregistration on the current study (SUVmax = 13.6 No area of increased FDG uptake within the lung parenchyma) Incidental CT findings: Lungs are clear aside from minimal scarring in the anterior right chest. No pleural effusion. Airways are patent. Calcified atheromatous plaque throughout the thoracic aorta. Calcified coronary artery disease. ABDOMEN/PELVIS: Left para-aortic lymph node with intense FDG uptake (image 135, series 4) 10 mm (SUVmax = 20.1) hepato gastric lymph node as described. No areas of suspicious uptake within solid organs. There is an 8 mm right juxta crural lymph node (image 120, series 4) with similar uptake to the uptake seen in the hepato gastric lymph node. Incidental CT findings: CT appearance of liver, gallbladder, spleen, pancreas and adrenal glands is normal. Nonobstructing calculus in the upper pole the right kidney. No evidence of hydronephrosis. Left ureteral calculus measures 7 mm in the distal left ureter and is not associated with hydronephrosis, unchanged compared to previous imaging. No acute gastrointestinal finding. Appendix is normal. Stool fills much of the colon. SKELETON: No focal hypermetabolic activity to suggest skeletal metastasis. Incidental CT findings: Spinal degenerative changes. IMPRESSION: 1. Signs of esophageal cancer with large esophageal mass with nodal involvement both above and below the diaphragm. 2. 7 mm distal left ureteral calculus is unchanged and does not lead to urinary tract obstruction. Electronically Signed   By: Zetta Bills M.D.   On: 07/05/2019 11:55    Labs:  CBC: Recent Labs    05/06/19 1642 07/05/19 2037 07/06/19 0615  WBC 7.9 9.8 8.2  HGB 14.8 13.2 13.0  HCT 45.4 40.6 40.3  PLT 202 285 252    COAGS: Recent Labs    07/05/19 2037  INR 1.0    BMP: Recent Labs    05/06/19 1642 07/05/19 2037 07/06/19 0615  NA 139 139 141  K  4.2 3.8 3.8  CL 103 103 107  CO2 26 25 24   GLUCOSE 101* 104* 103*  BUN 21 16 14  CALCIUM 9.4 10.1 9.9  CREATININE 1.50* 1.27* 1.22  GFRNONAA 46* 56* 59*  GFRAA 53* >60 >60    LIVER FUNCTION TESTS: Recent Labs    05/06/19 1642 07/05/19 2037  BILITOT 1.8* 1.1  AST 21 16  ALT 20 15  ALKPHOS 51 54  PROT 7.3 6.8  ALBUMIN 4.6 3.8    TUMOR MARKERS: No results for input(s): AFPTM, CEA, CA199, CHROMGRNA in the last 8760 hours.  Assessment and Plan:  Esophageal Ca Wt loss; worsening dysphagia Scheduled for probable direct stick gastric tube placement in IR 3/29 Risks and benefits image guided gastrostomy tube placement was discussed with the patient including, but not limited to the need for a barium enema during the procedure, bleeding, infection, peritonitis and/or damage to adjacent structures.  All of the patient's questions were answered, patient is agreeable to proceed. Consent signed and in chart.   Thank you for this interesting consult.  I greatly enjoyed meeting [employer] and look forward to participating in their care.  A copy of this report was sent to the requesting provider on this date.  Electronically Signed: Lavonia Drafts, PA-C 07/06/2019, 11:22 AM   I spent a total of 40 Minutes    in face to face in clinical consultation, greater than 50% of which was counseling/coordinating care for percutaneous gastric tube placement

## 2019-07-07 NOTE — Progress Notes (Addendum)
PROGRESS NOTE    David [name]  GGE:[id number] DOB: [birth year] DOA: 07/05/2019 PCP: Leighton Ruff, MD   Brief Narrative: 74 y.o.malewith medical history significant foresophageal cancer diagnosed 04/2019, dementia, COPD, CKD stage III, OSA intolerable of CPAP, hypertension and depression/anxiety who presents as a direct admission from outpatient oncology office for concerns of worsening dysphagia.  Patient presented to his oncologist today for discussion of treatment plan but was noted to have worsening dysphagiawith concerns of being able to maintain good nutritional statusand plan were made for him to be admitted for arrangement of a feeding tube and IV fluid hydration. Patient reports that he can only tolerate food after he has masticated liquid and even then he might sometimes vomit due to nausea. Currently, he denies any pain. No chest pain, shortness of breath. No nausea, vomiting or diarrhea.  He reports marijuana use in the past. Denies any alcohol or tobacco use.  Assessment & Plan:   Principal Problem:   Dysphagia Active Problems:   Major depressive disorder, recurrent episode, severe (HCC)   Generalized anxiety disorder   Dementia without behavioral disturbance (HCC)   AKI (acute kidney injury) ([place])   Esophageal cancer ([place])  #1 dysphagia-patient with history of advanced esophageal cancer with gradually worsening dysphagia.  He is admitted for PEG tube placement.  I have consulted interventional radiology.  Plan is for them to place PEG tube tomorrow. Patient is on clear liquids seen by speech therapy upgraded diet to full liquids but he was not able to tolerate it he threw up the applesauce.  Continue on IV fluids.  He is not able to tolerate any p.o. intake. CT scan May 06, 2019 shows circumferential thickening of the distal esophagus with mild splenomegaly bladder wall thickening and enlarged prostate. Upper endoscopy 06/11/2019 shows ulcerated mass of the  lower esophagus partially obstructing biopsy confirmed as poorly differentiated malignant neoplasm. Repeat endoscopy 06/21/2019 shows partially obstructing esophageal tumor at the lower third of the esophagus. PET scan 07/05/2019 shows large esophageal mass with nodal involvement both above and below the diaphragm Covid negative  #2 AKI creatinine on admission 1.27 down to 1.22 with IV fluids  #3 history of dementia on Aricept Namenda and Zyprexa  #4 history of depression and anxiety on Lexapro and Wellbutrin and as needed Ativan  #5 obstructive sleep apnea patient is intolerant to CPAP  #6 benign prostatic hypertrophy on Flomax  #7 hyperlipidemia on zocor   Estimated body mass index is 28.5 kg/m as calculated from the following:   Height as of an earlier encounter on 07/05/19: 6\' 2"  (1.88 m).   Weight as of an earlier encounter on 07/05/19: 100.7 kg.  DVT prophylaxis: SCD Code Status: Full code Family Communication: Called his wife with no response Disposition Plan: Patient came from home Plan is to discharge him home Barriers to discharge patient n.p.o. with dysphagia on IV fluids with progressive dysphagia and advanced esophageal cancer for PEG tube placement  Consultants:   Interventional radiology  Procedures: None Antimicrobials: None  Subjective:  He is resting in bed  Was not able to tolerate apple sauce  Objective: Vitals:   07/06/19 0546 07/06/19 1406 07/06/19 2118 07/07/19 0502  BP: 134/83 129/87 135/68 (!) 144/77  Pulse: 81 72 79 73  Resp: 17 12 17 17   Temp: 98.2 F (36.8 C) 97.9 F (36.6 C) 98.8 F (37.1 C) 98.8 F (37.1 C)  TempSrc: Oral  Oral Oral  SpO2: 96% 99% 97% 97%    Intake/Output Summary (Last  24 hours) at 07/07/2019 1112 Last data filed at 07/06/2019 2118 Gross per 24 hour  Intake 1869.06 ml  Output --  Net 1869.06 ml   There were no vitals filed for this visit.  Examination:  General exam: Appears calm and comfortable   Respiratory system: Clear to auscultation. Respiratory effort normal. Cardiovascular system: S1 & S2 heard, RRR. No JVD, murmurs, rubs, gallops or clicks. No pedal edema. Gastrointestinal system: Abdomen is nondistended, soft and nontender. No organomegaly or masses felt. Normal bowel sounds heard. Central nervous system: Alert and oriented. No focal neurological deficits. Extremities: Symmetric 5 x 5 power. Skin: No rashes, lesions or ulcers Psychiatry: Judgement and insight appear normal. Mood & affect appropriate.     Data Reviewed: I have personally reviewed following labs and imaging studies  CBC: Recent Labs  Lab 07/05/19 2037 07/06/19 0615  WBC 9.8 8.2  HGB 13.2 13.0  HCT 40.6 40.3  MCV 92.3 92.2  PLT 285 295   Basic Metabolic Panel: Recent Labs  Lab 07/05/19 2037 07/06/19 0615  NA 139 141  K 3.8 3.8  CL 103 107  CO2 25 24  GLUCOSE 104* 103*  BUN 16 14  CREATININE 1.27* 1.22  CALCIUM 10.1 9.9   GFR: Estimated Creatinine Clearance: 69.4 mL/min (by C-G formula based on SCr of 1.22 mg/dL). Liver Function Tests: Recent Labs  Lab 07/05/19 2037  AST 16  ALT 15  ALKPHOS 54  BILITOT 1.1  PROT 6.8  ALBUMIN 3.8   No results for input(s): LIPASE, AMYLASE in the last 168 hours. No results for input(s): AMMONIA in the last 168 hours. Coagulation Profile: Recent Labs  Lab 07/05/19 2037  INR 1.0   Cardiac Enzymes: No results for input(s): CKTOTAL, CKMB, CKMBINDEX, TROPONINI in the last 168 hours. BNP (last 3 results) No results for input(s): PROBNP in the last 8760 hours. HbA1C: No results for input(s): HGBA1C in the last 72 hours. CBG: Recent Labs  Lab 07/05/19 0947  GLUCAP 118*   Lipid Profile: No results for input(s): CHOL, HDL, LDLCALC, TRIG, CHOLHDL, LDLDIRECT in the last 72 hours. Thyroid Function Tests: No results for input(s): TSH, T4TOTAL, FREET4, T3FREE, THYROIDAB in the last 72 hours. Anemia Panel: No results for input(s): VITAMINB12,  FOLATE, FERRITIN, TIBC, IRON, RETICCTPCT in the last 72 hours. Sepsis Labs: No results for input(s): PROCALCITON, LATICACIDVEN in the last 168 hours.  Recent Results (from the past 240 hour(s))  SARS CORONAVIRUS 2 (TAT 6-24 HRS) Nasopharyngeal Nasopharyngeal Swab     Status: None   Collection Time: 07/05/19  8:28 PM   Specimen: Nasopharyngeal Swab  Result Value Ref Range Status   SARS Coronavirus 2 NEGATIVE NEGATIVE Final    Comment: (NOTE) SARS-CoV-2 target nucleic acids are NOT DETECTED. The SARS-CoV-2 RNA is generally detectable in upper and lower respiratory specimens during the acute phase of infection. Negative results do not preclude SARS-CoV-2 infection, do not rule out co-infections with other pathogens, and should not be used as the sole basis for treatment or other patient management decisions. Negative results must be combined with clinical observations, patient history, and epidemiological information. The expected result is Negative. Fact Sheet for Patients: SugarRoll.be Fact Sheet for Healthcare Providers: https://www.woods-.com/ This test is not yet approved or cleared by the [place] FDA and  has been authorized for detection and/or diagnosis of SARS-CoV-2 by FDA under an Emergency Use Authorization (EUA). This EUA will remain  in effect (meaning this test can be used) for the duration of the COVID-19  declaration under Section 56 4(b)(1) of the Act, 21 U.S.C. section 360bbb-3(b)(1), unless the authorization is terminated or revoked sooner. Performed at [facility], [place] [address], [place], [place] [address]          Radiology Studies: NM PET Image Initial (PI) Skull Base To Thigh  Result Date: 07/05/2019 CLINICAL DATA:  Initial treatment strategy for esophageal cancer. EXAM: NUCLEAR MEDICINE PET SKULL BASE TO THIGH TECHNIQUE: 11.01 mCi F-18 FDG was injected intravenously. Full-ring PET imaging was  performed from the skull base to thigh after the radiotracer. CT data was obtained and used for attenuation correction and anatomic localization. Fasting blood glucose: 118 mg/dl COMPARISON:  CT abdomen and pelvis 05/06/2019 FINDINGS: Mediastinal blood pool activity: SUV max 2.81 Liver activity: SUV max NA NECK: No hypermetabolic lymph nodes in the neck. Incidental CT findings: none CHEST: Large mass in the distal esophagus with marked increased FDG uptake. Masslike thickening is circumferential (SUVmax = 24.6) (image 103, series 3 and series 4) wall thickening as much as 3.6 cm. In total the esophagus at this level measures 7.5 x 5.2 cm. Multiple mediastinal lymph nodes beginning from just below the thoracic inlet also with lymph nodes in the retrocrural region and below the diaphragm. (Image 59, series 3): High right paratracheal lymph node just below the thoracic inlet with rounded borders measuring 2.0 cm. (SUVmax = 28.1) Tiny lymph node just anterior to the aorta (image 91, series 4) difficult to visualize on CT (SUVmax = 7.1) Juxta aortic lymph node just to the left of long the anterior margin of the aorta (image 96, series 4): (SUVmax = 11 0.0) Small discrete foci of increased FDG uptake at the level of the esophageal hiatus, these appear distinct from the dominant mass, a small hepatic gastric lymph node (image 121, series 4) likely corresponds to the area of uptake seen just anterior to the aorta, perhaps with some variable respiration there is misregistration on the current study (SUVmax = 13.6 No area of increased FDG uptake within the lung parenchyma) Incidental CT findings: Lungs are clear aside from minimal scarring in the anterior right chest. No pleural effusion. Airways are patent. Calcified atheromatous plaque throughout the thoracic aorta. Calcified coronary artery disease. ABDOMEN/PELVIS: Left para-aortic lymph node with intense FDG uptake (image 135, series 4) 10 mm (SUVmax = 20.1) hepato  gastric lymph node as described. No areas of suspicious uptake within solid organs. There is an 8 mm right juxta crural lymph node (image 120, series 4) with similar uptake to the uptake seen in the hepato gastric lymph node. Incidental CT findings: CT appearance of liver, gallbladder, spleen, pancreas and adrenal glands is normal. Nonobstructing calculus in the upper pole the right kidney. No evidence of hydronephrosis. Left ureteral calculus measures 7 mm in the distal left ureter and is not associated with hydronephrosis, unchanged compared to previous imaging. No acute gastrointestinal finding. Appendix is normal. Stool fills much of the colon. SKELETON: No focal hypermetabolic activity to suggest skeletal metastasis. Incidental CT findings: Spinal degenerative changes. IMPRESSION: 1. Signs of esophageal cancer with large esophageal mass with nodal involvement both above and below the diaphragm. 2. 7 mm distal left ureteral calculus is unchanged and does not lead to urinary tract obstruction. Electronically Signed   By: Zetta Bills M.D.   On: 07/05/2019 11:55        Scheduled Meds: . buPROPion  150 mg Oral Daily  . cholecalciferol  2,000 Units Oral Daily  . donepezil  5 mg  Oral QHS  . escitalopram  20 mg Oral Daily  . memantine  10 mg Oral BID  . OLANZapine  5 mg Oral QHS  . simvastatin  40 mg Oral QPM  . tamsulosin  0.4 mg Oral Daily  . vitamin B-12  1,000 mcg Oral Daily   Continuous Infusions: . sodium chloride 75 mL/hr at 07/06/19 2129  . [START ON 07/08/2019]  ceFAZolin (ANCEF) IV       LOS: 2 days     Georgette Shell, MD 07/07/2019, 11:12 AM

## 2019-07-08 ENCOUNTER — Inpatient Hospital Stay (HOSPITAL_COMMUNITY): Payer: Medicare Other

## 2019-07-08 ENCOUNTER — Encounter (HOSPITAL_COMMUNITY): Payer: Self-pay | Admitting: Family Medicine

## 2019-07-08 HISTORY — PX: IR GASTROSTOMY TUBE MOD SED: IMG625

## 2019-07-08 LAB — BASIC METABOLIC PANEL
Anion gap: 8 (ref 5–15)
BUN: 7 mg/dL — ABNORMAL LOW (ref 8–23)
CO2: 27 mmol/L (ref 22–32)
Calcium: 9.6 mg/dL (ref 8.9–10.3)
Chloride: 103 mmol/L (ref 98–111)
Creatinine, Ser: 1.32 mg/dL — ABNORMAL HIGH (ref 0.61–1.24)
GFR calc Af Amer: >60 mL/min (ref >60)
GFR calc non Af Amer: 54 mL/min — ABNORMAL LOW (ref >60)
Glucose, Bld: 101 mg/dL — ABNORMAL HIGH (ref 70–99)
Potassium: 4.1 mmol/L (ref 3.5–5.1)
Sodium: 138 mmol/L (ref 135–145)

## 2019-07-08 LAB — CBC
HCT: 38.7 % — ABNORMAL LOW (ref 39.0–52.0)
Hemoglobin: 12.6 g/dL — ABNORMAL LOW (ref 13.0–17.0)
MCH: 30.2 pg (ref 26.0–34.0)
MCHC: 32.6 g/dL (ref 30.0–36.0)
MCV: 92.8 fL (ref 80.0–100.0)
Platelets: 243 10*3/uL (ref 150–400)
RBC: 4.17 MIL/uL — ABNORMAL LOW (ref 4.22–5.81)
RDW: 11.9 % (ref 11.5–15.5)
WBC: 8.4 10*3/uL (ref 4.0–10.5)
nRBC: 0 % (ref 0.0–0.2)

## 2019-07-08 MED ORDER — GLUCAGON HCL RDNA (DIAGNOSTIC) 1 MG IJ SOLR
INTRAMUSCULAR | Status: AC
  Filled 2019-07-08: qty 1

## 2019-07-08 MED ORDER — NALOXONE HCL 0.4 MG/ML IJ SOLN
INTRAMUSCULAR | Status: AC
  Filled 2019-07-08: qty 1

## 2019-07-08 MED ORDER — MIDAZOLAM HCL 2 MG/2ML IJ SOLN
INTRAMUSCULAR | Status: AC | PRN
  Administered 2019-07-08: 1 mg via INTRAVENOUS

## 2019-07-08 MED ORDER — IOHEXOL 300 MG/ML  SOLN
50.0000 mL | Freq: Once | INTRAMUSCULAR | Status: AC | PRN
  Administered 2019-07-08: 10 mL

## 2019-07-08 MED ORDER — CEFAZOLIN SODIUM-DEXTROSE 2-4 GM/100ML-% IV SOLN
INTRAVENOUS | Status: AC
  Administered 2019-07-08: 2 g via INTRAVENOUS
  Filled 2019-07-08: qty 100

## 2019-07-08 MED ORDER — MIDAZOLAM HCL 2 MG/2ML IJ SOLN
INTRAMUSCULAR | Status: AC
  Filled 2019-07-08: qty 6

## 2019-07-08 MED ORDER — HYDROCODONE-ACETAMINOPHEN 5-325 MG PO TABS
1.0000 | ORAL_TABLET | ORAL | Status: DC | PRN
  Filled 2019-07-08: qty 2

## 2019-07-08 MED ORDER — FLUMAZENIL 0.5 MG/5ML IV SOLN
INTRAVENOUS | Status: AC
  Filled 2019-07-08: qty 5

## 2019-07-08 MED ORDER — FENTANYL CITRATE (PF) 100 MCG/2ML IJ SOLN
INTRAMUSCULAR | Status: AC
  Filled 2019-07-08: qty 6

## 2019-07-08 MED ORDER — LIDOCAINE HCL 1 % IJ SOLN
INTRAMUSCULAR | Status: AC
  Filled 2019-07-08: qty 20

## 2019-07-08 MED ORDER — FENTANYL CITRATE (PF) 100 MCG/2ML IJ SOLN
INTRAMUSCULAR | Status: AC | PRN
  Administered 2019-07-08: 50 ug via INTRAVENOUS

## 2019-07-08 MED ORDER — ONDANSETRON HCL 4 MG/2ML IJ SOLN: 4.0000 mg | INTRAMUSCULAR | Status: DC | PRN

## 2019-07-08 NOTE — Progress Notes (Signed)
Initial Nutrition Assessment  DOCUMENTATION CODES:   Not applicable  INTERVENTION:  - once PEG placed and ready for use, recommend Osmolite 1.5 @ 20 ml/hr to advance by 10 ml every 12 hours to reach goal rate of 60 ml/hr with 30 ml prostat BID and 100 ml free water every 4 hours. - at goal rate, this regimen will provide 2360 kcal, 120 grams protein, and 1697 ml free water.   Monitor magnesium, potassium, and phosphorus daily for at least 3 days, MD to replete as needed, as pt is at risk for refeeding syndrome given poor nutrition intake PTA.   NUTRITION DIAGNOSIS:   Increased nutrient needs related to cancer and cancer related treatments, catabolic illness as evidenced by estimated needs.  GOAL:   Patient will meet greater than or equal to 90% of their needs  MONITOR:   TF tolerance, Labs, Weight trends  REASON FOR ASSESSMENT:   Diagnosis  ASSESSMENT:   74 y.o. male with medical history of esophageal cancer diagnosed 04/2019, dementia, COPD, stage 3 CKD, OSA intolerable of CPAP, HTN, and depression/anxiety. He presented to the ED on 3/26 as a direct admission from [place] office due to concern for worsening dysphagia. Concern for ability to maintain adequate nutrition through cancer treatment and plan was made for admission and placement of a feeding tube.  Diet advanced from NPO to CLD on 3/27 at 1112 then to [place] on 3/27 at 1800. He consumed 100% of dinner (FLD) on 3/27 and no other intakes documented. He was made NPO at midnight last night.   Patient reports PTA he had a good appetite but getting items down was progressively more difficult. He denies any overt pain with swallowing, but that it felt like items would not go down the whole way which would lead to nausea and subsequent vomiting. He was unable to consume solid foods and even items such as yogurt felt too thick and difficult to get down. He did best with thin liquids, but even this was not as easy as it used to be  for him.  Per chart review, weight today is 222 lb and PTA the most recently documented weight was on 10/11/2018 at [facility] when he weighed 227 lb. This indicates 5 lb weight loss (2.2% body weight).  Plan is for PEG placement today (3/29). TF recommendations outlined above.    Labs reviewed; BUN: 7 mg/dl, creatinine: 1.32 mg/dl, GFR: 54 ml/min. Medications reviewed; 2000 units vitamin D/day, 1000 mcg oral cyanocobalamin/day.  IVF; NS @ 75 ml/hr.    NUTRITION - FOCUSED PHYSICAL EXAM:  completed; no muscle or fat wasting.   Diet Order:   Diet Order            Diet NPO time specified  Diet effective now              EDUCATION NEEDS:   Not appropriate for education at this time  Skin:  Skin Assessment: Reviewed RN Assessment  Last BM:  3/28  Height:   Ht Readings from Last 1 Encounters:  07/08/19 6' 2.02" (1.88 m)    Weight:   Wt Readings from Last 1 Encounters:  07/08/19 100.7 kg    Ideal Body Weight:  83.4 kg  BMI:  Body mass index is 28.49 kg/m.  Estimated Nutritional Needs:   Kcal:  2300-2500 kcal  Protein:  115-130 grams  Fluid:  >/= 2.4 L/day     Jarome Matin, MS, RD, LDN, CNSC Inpatient Clinical Dietitian RD pager # available in  [facility]ION  After hours/weekend pager # available in [facility]

## 2019-07-08 NOTE — Care Management Important Message (Signed)
Important Message  Patient Details IM Letter given to Roque Lias SW Case Manager to present to the Patient Name: Saman Umstead Parfait MRN: [id number] Date of Birth: February 04, 1946   Medicare Important Message Given:  Yes     Kerin Salen 07/08/2019, 10:33 AM

## 2019-07-08 NOTE — Progress Notes (Signed)
RN called spouse to notify pt has left floor for procedure per spouse request. Spouse had no further questions at this time.

## 2019-07-08 NOTE — Procedures (Signed)
Interventional Radiology Procedure Note  Procedure: Placement of percutaneous 22F balloon retention gastrostomy tube. Complications: None Recommendations: - NPO except for sips and chips remainder of today and overnight - Maintain G-tube to LWS until tomorrow morning  - May advance diet as tolerated and begin using tube tomorrow morning  Signed,  Criselda Peaches, MD

## 2019-07-08 NOTE — Progress Notes (Signed)
PROGRESS NOTE    David [name]  ZDG:[id number] DOB: 11-24-1945 DOA: 07/05/2019 PCP: Leighton Ruff, MD   Brief Narrative: 74 y.o.malewith medical history significant foresophageal cancer diagnosed 04/2019, dementia, COPD, CKD stage III, OSA intolerable of CPAP, hypertension and depression/anxiety who presents as a direct admission from outpatient oncology office for concerns of worsening dysphagia.  Patient presented to his oncologist today for discussion of treatment plan but was noted to have worsening dysphagiawith concerns of being able to maintain good nutritional statusand plan were made for him to be admitted for arrangement of a feeding tube and IV fluid hydration. Patient reports that he can only tolerate food after he has masticated liquid and even then he might sometimes vomit due to nausea. Currently, he denies any pain. No chest pain, shortness of breath. No nausea, vomiting or diarrhea.  He reports marijuana use in the past. Denies any alcohol or tobacco use.  Assessment & Plan:   Principal Problem:   Dysphagia Active Problems:   Major depressive disorder, recurrent episode, severe (HCC)   Generalized anxiety disorder   Dementia without behavioral disturbance (HCC)   AKI (acute kidney injury) ([place])   Esophageal cancer ([place])   #1 dysphagia-patient with history of advanced esophageal cancer with gradually worsening dysphagia. He is admitted for PEG tube placement. I have consulted interventional radiology.  Plan is for them to place PEG tube today.Patient has been NPO after MN. Continue on IV fluids. He is not able to tolerate any p.o. intake.  CT scan May 06, 2019 shows circumferential thickening of the distal esophagus with mild splenomegaly bladder wall thickening and enlarged prostate. Upper endoscopy 06/11/2019 shows ulcerated mass of the lower esophagus partially obstructing biopsy confirmed as poorly differentiated malignantneoplasm. Repeat  endoscopy 06/21/2019 shows partially obstructing esophageal tumor at the lower third of the esophagus.  PET scan 07/05/2019 shows large esophageal mass with nodal involvement both above and below the diaphragm  Covid negative  #2 AKI creatinine  Creatinine 1.32  #3 history of dementia on Aricept Namenda and Zyprexa  #4 history of depression and anxiety on Lexapro and Wellbutrin and as needed Ativan  #5 obstructive sleep apnea patient is intolerant to CPAP  #6 benign prostatic hypertrophy on Flomax  #7 hyperlipidemiaon zocor    Estimated body mass index is 28.49 kg/m as calculated from the following:   Height as of this encounter: 6' 2.02" (1.88 m).   Weight as of this encounter: 100.7 kg.  DVT prophylaxis:SCD Code Status:Full code Family Communication:Called his wife with no response Disposition Plan:Patient came from home Plan is to discharge him home Barriers to discharge patient n.p.o. with dysphagia on IV fluids with progressive dysphagia and advanced esophageal cancer for PEG tube placement  Consultants:  Interventional radiology  Procedures:None Antimicrobials:None   Subjective: Resting in bed in nad  Await PEG placement   Objective: Vitals:   07/07/19 1414 07/07/19 2053 07/08/19 0539 07/08/19 0851  BP: (!) 141/74 131/81 139/81 139/81  Pulse: (!) 59 67 70 70  Resp: 16 18 16 16   Temp: 98.9 F (37.2 C) 98.6 F (37 C) 97.9 F (36.6 C) 97.9 F (36.6 C)  TempSrc: Oral Oral Oral Oral  SpO2: 99% 99% 97%   Weight:    100.7 kg  Height:    6' 2.02" (1.88 m)    Intake/Output Summary (Last 24 hours) at 07/08/2019 1128 Last data filed at 07/08/2019 0600 Gross per 24 hour  Intake 825 ml  Output --  Net 825 ml  Filed Weights   07/08/19 0851  Weight: 100.7 kg    Examination:  General exam: Appears calm and comfortable  Respiratory system: Clear to auscultation. Respiratory effort normal. Cardiovascular system: S1 & S2 heard, RRR. No  JVD, murmurs, rubs, gallops or clicks. No pedal edema. Gastrointestinal system: Abdomen is nondistended, soft and nontender. No organomegaly or masses felt. Normal bowel sounds heard. Central nervous system: Alert and oriented. No focal neurological deficits. Extremities: Symmetric 5 x 5 power. Skin: No rashes, lesions or ulcers Psychiatry: Judgement and insight appear normal. Mood & affect appropriate.     Data Reviewed: I have personally reviewed following labs and imaging studies  CBC: Recent Labs  Lab 07/05/19 2037 07/06/19 0615 07/08/19 0525  WBC 9.8 8.2 8.4  HGB 13.2 13.0 12.6*  HCT 40.6 40.3 38.7*  MCV 92.3 92.2 92.8  PLT 285 252 884   Basic Metabolic Panel: Recent Labs  Lab 07/05/19 2037 07/06/19 0615 07/08/19 0525  NA 139 141 138  K 3.8 3.8 4.1  CL 103 107 103  CO2 25 24 27   GLUCOSE 104* 103* 101*  BUN 16 14 7*  CREATININE 1.27* 1.22 1.32*  CALCIUM 10.1 9.9 9.6   GFR: Estimated Creatinine Clearance: 64.1 mL/min (A) (by C-G formula based on SCr of 1.32 mg/dL (H)). Liver Function Tests: Recent Labs  Lab 07/05/19 2037  AST 16  ALT 15  ALKPHOS 54  BILITOT 1.1  PROT 6.8  ALBUMIN 3.8   No results for input(s): LIPASE, AMYLASE in the last 168 hours. No results for input(s): AMMONIA in the last 168 hours. Coagulation Profile: Recent Labs  Lab 07/05/19 2037  INR 1.0   Cardiac Enzymes: No results for input(s): CKTOTAL, CKMB, CKMBINDEX, TROPONINI in the last 168 hours. BNP (last 3 results) No results for input(s): PROBNP in the last 8760 hours. HbA1C: No results for input(s): HGBA1C in the last 72 hours. CBG: Recent Labs  Lab 07/05/19 0947  GLUCAP 118*   Lipid Profile: No results for input(s): CHOL, HDL, LDLCALC, TRIG, CHOLHDL, LDLDIRECT in the last 72 hours. Thyroid Function Tests: No results for input(s): TSH, T4TOTAL, FREET4, T3FREE, THYROIDAB in the last 72 hours. Anemia Panel: No results for input(s): VITAMINB12, FOLATE, FERRITIN, TIBC,  IRON, RETICCTPCT in the last 72 hours. Sepsis Labs: No results for input(s): PROCALCITON, LATICACIDVEN in the last 168 hours.  Recent Results (from the past 240 hour(s))  SARS CORONAVIRUS 2 (TAT 6-24 HRS) Nasopharyngeal Nasopharyngeal Swab     Status: None   Collection Time: 07/05/19  8:28 PM   Specimen: Nasopharyngeal Swab  Result Value Ref Range Status   SARS Coronavirus 2 NEGATIVE NEGATIVE Final    Comment: (NOTE) SARS-CoV-2 target nucleic acids are NOT DETECTED. The SARS-CoV-2 RNA is generally detectable in upper and lower respiratory specimens during the acute phase of infection. Negative results do not preclude SARS-CoV-2 infection, do not rule out co-infections with other pathogens, and should not be used as the sole basis for treatment or other patient management decisions. Negative results must be combined with clinical observations, patient history, and epidemiological information. The expected result is Negative. Fact Sheet for Patients: SugarRoll.be Fact Sheet for Healthcare Providers: https://www.woods-.com/ This test is not yet approved or cleared by the [place] FDA and  has been authorized for detection and/or diagnosis of SARS-CoV-2 by FDA under an Emergency Use Authorization (EUA). This EUA will remain  in effect (meaning this test can be used) for the duration of the COVID-19 declaration under Section 56 4(b)(1)  of the Act, 21 U.S.C. section 360bbb-3(b)(1), unless the authorization is terminated or revoked sooner. Performed at [facility], [place] [address], [place], [place] [address]          Radiology Studies: No results found.      Scheduled Meds: . buPROPion  150 mg Oral Daily  . cholecalciferol  2,000 Units Oral Daily  . donepezil  5 mg Oral QHS  . escitalopram  20 mg Oral Daily  . memantine  10 mg Oral BID  . OLANZapine  5 mg Oral QHS  . simvastatin  40 mg Oral QPM  . tamsulosin   0.4 mg Oral Daily  . vitamin B-12  1,000 mcg Oral Daily   Continuous Infusions: . sodium chloride 75 mL/hr at 07/08/19 0600  .  ceFAZolin (ANCEF) IV       LOS: 3 days     Georgette Shell, MD 07/08/2019, 11:28 AM

## 2019-07-09 ENCOUNTER — Encounter (HOSPITAL_COMMUNITY): Payer: Medicare Other

## 2019-07-09 DIAGNOSIS — C155 Malignant neoplasm of lower third of esophagus: Secondary | ICD-10-CM

## 2019-07-09 MED ORDER — FREE WATER
100.0000 mL | Status: DC
  Administered 2019-07-09 – 2019-07-12 (×19): 100 mL

## 2019-07-09 MED ORDER — PRO-STAT SUGAR FREE PO LIQD
30.0000 mL | Freq: Two times a day (BID) | ORAL | Status: DC
  Administered 2019-07-09 – 2019-07-12 (×7): 30 mL via ORAL
  Filled 2019-07-09 (×7): qty 30

## 2019-07-09 MED ORDER — OSMOLITE 1.5 CAL PO LIQD
1000.0000 mL | ORAL | Status: DC
  Administered 2019-07-09 – 2019-07-10 (×2): 1000 mL
  Filled 2019-07-09 (×3): qty 1000

## 2019-07-09 NOTE — Progress Notes (Signed)
NUTRITION NOTE  Patient seen for full assessment yesterday. PEG placed yesterday afternoon and TF started today at 1035. He is currently receiving Osmolite 1.5 @ 20 ml/hr with 30 ml prostat BID and 100 ml free water every 4 hours.   Plan to increase Osmolite 1.5 every 12 hours to reach goal rate of 60 ml/hr with 30 ml prostat BID and 100 ml free water every 4 hours. At goal rate, this regimen will provide 2360 kcal, 120 grams protein, and 1697 ml free water. Slow advancement d/t high risk for refeeding. K WDL and Mg and Phos not checked today.   Patient is currently on CLD and consumed 100% of dinner last night (260 kcal, 2 grams protein).   Message received from Dr. Rodena Piety inquiring about bolus TF regimen for home. Patient lives alone. Plan is to advance to FLD prior to d/c, if patient is able to tolerate more than CLD.   Home TF regimen: 1 carton (237 ml) Osmolite 1.5 x5/day with 30 ml prostat (or equivalent) BID and 100 ml free water with each TF bolus (50 ml before and 50 ml after each bolus.  - this regimen will provide 1975 kcal (86% estimated kcal need), 104 grams protein (90% estimated protein need), and 1405 ml free water.    Estimated Nutritional Needs:  Kcal:  2300-2500 kcal Protein:  115-130 grams Fluid:  >/= 2.4 L/day    Jarome Matin, MS, RD, LDN, CNSC Inpatient Clinical Dietitian RD pager # available in [facility]  After hours/weekend pager # available in [facility]

## 2019-07-09 NOTE — Progress Notes (Signed)
PROGRESS NOTE    David [name]  PNT:[id number] DOB: 18-Apr-1945 DOA: 07/05/2019 PCP: Leighton Ruff, MD   Brief Narrative:74 y.o.malewith medical history significant foresophageal cancer diagnosed 04/2019, dementia, COPD, CKD stage III, OSA intolerable of CPAP, hypertension and depression/anxiety who presents as a direct admission from outpatient oncology office for concerns of worsening dysphagia.  Patient presented to his oncologist today for discussion of treatment plan but was noted to have worsening dysphagiawith concerns of being able to maintain good nutritional statusand plan were made for him to be admitted for arrangement of a feeding tube and IV fluid hydration. Patient reports that he can only tolerate food after he has masticated liquid and even then he might sometimes vomit due to nausea. Currently, he denies any pain. No chest pain, shortness of breath. No nausea, vomiting or diarrhea.  He reports marijuana use in the past. Denies any alcohol or tobacco use.  Assessment & Plan:   Principal Problem:   Dysphagia Active Problems:   Major depressive disorder, recurrent episode, severe (HCC)   Generalized anxiety disorder   Dementia without behavioral disturbance (HCC)   AKI (acute kidney injury) ([place])   Esophageal cancer ([place])  #1 dysphagia-patient with history of advanced esophageal cancer with gradually worsening dysphagia.  He is status post PEG tube placement 07/08/2019.  Tube feeds to be started today.  Will need to monitor closely for refeeding syndrome for the next 3 days. Will order daily labs and correct electrolytes as needed.   CT scan May 06, 2019 shows circumferential thickening of the distal esophagus with mild splenomegaly bladder wall thickening and enlarged prostate.  Upper endoscopy 06/11/2019 shows ulcerated mass of the lower esophagus partially obstructing biopsy confirmed as poorly differentiated malignantneoplasm. Repeat endoscopy  06/21/2019 shows partially obstructing esophageal tumor at the lower third of the esophagus.  PET scan 07/05/2019 shows large esophageal mass with nodal involvement both above and below the diaphragm  Covid negative  #2 AKI creatinine  Creatinine 1.32  #3 history of dementia on Aricept Namenda and Zyprexa  #4 history of depression and anxiety on Lexapro and Wellbutrin and as needed Ativan  #5 obstructive sleep apnea patient is intolerant to CPAP  #6 benign prostatic hypertrophy on Flomax  #7 hyperlipidemiaon zocor   Nutrition Problem: Increased nutrient needs Etiology: cancer and cancer related treatments, catabolic illness     Signs/Symptoms: estimated needs    Interventions: Refer to RD note for recommendations  Estimated body mass index is 28.49 kg/m as calculated from the following:   Height as of this encounter: 6' 2.02" (1.88 m).   Weight as of this encounter: 100.7 kg.  DVT prophylaxis:SCD Code Status:Full code Family Communication:Called his wife with no response Disposition Plan:Patient came from home Plan is to discharge him home Barriers to discharge--patient being started on tube feeds for the first time today.  Will need to closely monitor for refeeding syndrome.   Consultants:  Interventional radiology  Procedures:None Antimicrobials:None  Subjective: He is resting in bed feels very tired and weak  Objective: Vitals:   07/08/19 1539 07/08/19 1630 07/08/19 2110 07/09/19 0527  BP: 136/81 (!) 143/80 (!) 152/88 140/86  Pulse: 74 68 84 67  Resp: 15 14 16 14   Temp: 98.5 F (36.9 C) 98.9 F (37.2 C) 97.8 F (36.6 C) 97.8 F (36.6 C)  TempSrc: Oral Oral Oral Oral  SpO2: 97% 99% 99% 98%  Weight:      Height:        Intake/Output Summary (Last 24 hours)  at 07/09/2019 1341 Last data filed at 07/09/2019 1114 Gross per 24 hour  Intake 897.37 ml  Output 240 ml  Net 657.37 ml   Filed Weights   07/08/19 0851  Weight: 100.7 kg     Examination:  General exam: Appears calm and comfortable  Respiratory system: Clear to auscultation. Respiratory effort normal. Cardiovascular system: S1 & S2 heard, RRR. No JVD, murmurs, rubs, gallops or clicks. No pedal edema. Gastrointestinal system: Abdomen is nondistended, soft and nontender. No organomegaly or masses felt. Normal bowel sounds heard. Central nervous system: Alert and oriented. No focal neurological deficits. Extremities: Symmetric 5 x 5 power. Skin: No rashes, lesions or ulcers Psychiatry: Judgement and insight appear normal. Mood & affect appropriate.     Data Reviewed: I have personally reviewed following labs and imaging studies  CBC: Recent Labs  Lab 07/05/19 2037 07/06/19 0615 07/08/19 0525  WBC 9.8 8.2 8.4  HGB 13.2 13.0 12.6*  HCT 40.6 40.3 38.7*  MCV 92.3 92.2 92.8  PLT 285 252 761   Basic Metabolic Panel: Recent Labs  Lab 07/05/19 2037 07/06/19 0615 07/08/19 0525  NA 139 141 138  K 3.8 3.8 4.1  CL 103 107 103  CO2 25 24 27   GLUCOSE 104* 103* 101*  BUN 16 14 7*  CREATININE 1.27* 1.22 1.32*  CALCIUM 10.1 9.9 9.6   GFR: Estimated Creatinine Clearance: 64.1 mL/min (A) (by C-G formula based on SCr of 1.32 mg/dL (H)). Liver Function Tests: Recent Labs  Lab 07/05/19 2037  AST 16  ALT 15  ALKPHOS 54  BILITOT 1.1  PROT 6.8  ALBUMIN 3.8   No results for input(s): LIPASE, AMYLASE in the last 168 hours. No results for input(s): AMMONIA in the last 168 hours. Coagulation Profile: Recent Labs  Lab 07/05/19 2037  INR 1.0   Cardiac Enzymes: No results for input(s): CKTOTAL, CKMB, CKMBINDEX, TROPONINI in the last 168 hours. BNP (last 3 results) No results for input(s): PROBNP in the last 8760 hours. HbA1C: No results for input(s): HGBA1C in the last 72 hours. CBG: Recent Labs  Lab 07/05/19 0947  GLUCAP 118*   Lipid Profile: No results for input(s): CHOL, HDL, LDLCALC, TRIG, CHOLHDL, LDLDIRECT in the last 72  hours. Thyroid Function Tests: No results for input(s): TSH, T4TOTAL, FREET4, T3FREE, THYROIDAB in the last 72 hours. Anemia Panel: No results for input(s): VITAMINB12, FOLATE, FERRITIN, TIBC, IRON, RETICCTPCT in the last 72 hours. Sepsis Labs: No results for input(s): PROCALCITON, LATICACIDVEN in the last 168 hours.  Recent Results (from the past 240 hour(s))  SARS CORONAVIRUS 2 (TAT 6-24 HRS) Nasopharyngeal Nasopharyngeal Swab     Status: None   Collection Time: 07/05/19  8:28 PM   Specimen: Nasopharyngeal Swab  Result Value Ref Range Status   SARS Coronavirus 2 NEGATIVE NEGATIVE Final    Comment: (NOTE) SARS-CoV-2 target nucleic acids are NOT DETECTED. The SARS-CoV-2 RNA is generally detectable in upper and lower respiratory specimens during the acute phase of infection. Negative results do not preclude SARS-CoV-2 infection, do not rule out co-infections with other pathogens, and should not be used as the sole basis for treatment or other patient management decisions. Negative results must be combined with clinical observations, patient history, and epidemiological information. The expected result is Negative. Fact Sheet for Patients: SugarRoll.be Fact Sheet for Healthcare Providers: https://www.woods-.com/ This test is not yet approved or cleared by the [place] FDA and  has been authorized for detection and/or diagnosis of SARS-CoV-2 by FDA under an Emergency  Use Authorization (EUA). This EUA will remain  in effect (meaning this test can be used) for the duration of the COVID-19 declaration under Section 56 4(b)(1) of the Act, 21 U.S.C. section 360bbb-3(b)(1), unless the authorization is terminated or revoked sooner. Performed at [facility], [place] [address], [place], [place] [address]          Radiology Studies: IR GASTROSTOMY TUBE MOD SED  Result Date: 07/08/2019 INDICATION: 74 year old male with  obstructing esophageal mass and resultant dysphagia. EXAM: Fluoroscopically guided placement of percutaneous balloon retention gastrostomy tube Interventional Radiologist:  Criselda Peaches, MD MEDICATIONS: 2 g Ancef; Antibiotics were administered within 1 hour of the procedure. ANESTHESIA/SEDATION: Versed 2 mg IV; Fentanyl 100 mcg IV Moderate Sedation Time:  16 minutes The patient was continuously monitored during the procedure by the interventional radiology nurse under my direct supervision. CONTRAST:  74mL OMNIPAQUE IOHEXOL 300 MG/ML  SOLN FLUOROSCOPY TIME:  Fluoroscopy Time: 2 minutes 36 seconds (50 mGy). COMPLICATIONS: None immediate. PROCEDURE: Informed written consent was obtained from the patient after a thorough discussion of the procedural risks, benefits and alternatives. All questions were addressed. Maximal Sterile Barrier Technique was utilized including caps, mask, sterile gowns, sterile gloves, sterile drape, hand hygiene and skin antiseptic. A timeout was performed prior to the initiation of the procedure. Maximal barrier sterile technique utilized including caps, mask, sterile gowns, sterile gloves, large sterile drape, hand hygiene, and chlorhexadine skin prep. An angled catheter was advanced over a wire under fluoroscopic guidance through the nose, down the esophagus and into the body of the stomach. The stomach was then insufflated with several 100 ml of air. Fluoroscopy confirmed location of the gastric bubble, as well as inferior displacement of the barium stained colon. Under direct fluoroscopic guidance, two T-tacks were placed, and the anterior gastric wall drawn up against the anterior abdominal wall. Percutaneous access was then obtained into the mid gastric body with an 18 gauge needle. Aspiration of air, and injection of contrast material under fluoroscopy confirmed needle placement. An Amplatz wire was advanced in the gastric body. The tract was then serially dilated to 79 [place]  and an 18 [place] peel-away sheath was advanced into the stomach. A 16 French into it percutaneous balloon retention gastrostomy tube was then lubricated and advanced through the peel-away sheath. The peel-away sheath was discarded. The retention balloon was inflated with 6 mL saline and pulled snug against the anterior abdominal wall. The balloon retention gastrostomy tube was then secured with the external bumper and capped. A hand injection of contrast material was performed confirming that the tube is intragastric in location. One of the T tack retention sutures was cut. The second was left in place. The external bumper was secured to the anterior abdominal wall using 2 0 Prolene sutures. The patient will be observed for several hours with the newly placed tube on low wall suction to evaluate for any post procedure complication. The patient tolerated the procedure well, there is no immediate complication. IMPRESSION: Successful placement of a 62 French balloon retention percutaneous gastrostomy tube. Electronically Signed   By: Jacqulynn Cadet M.D.   On: 07/08/2019 16:21        Scheduled Meds: . buPROPion  150 mg Oral Daily  . cholecalciferol  2,000 Units Oral Daily  . donepezil  5 mg Oral QHS  . escitalopram  20 mg Oral Daily  . feeding supplement (OSMOLITE 1.5 CAL)  1,000 mL Per Tube Q24H  . feeding supplement (PRO-STAT SUGAR FREE 64)  30 mL Oral BID  . free water  100 mL Per Tube Q4H  . memantine  10 mg Oral BID  . OLANZapine  5 mg Oral QHS  . simvastatin  40 mg Oral QPM  . tamsulosin  0.4 mg Oral Daily  . vitamin B-12  1,000 mcg Oral Daily   Continuous Infusions: . sodium chloride 75 mL/hr at 07/08/19 1919     LOS: 4 days     Georgette Shell, MD  07/09/2019, 1:41 PM

## 2019-07-09 NOTE — Progress Notes (Signed)
Referring Physician(s): Mathews,E/Sherrill,B  Supervising Physician: Aletta Edouard  Patient Status:  David [name] - In-pt  Chief Complaint: Esophageal cancer, dysphagia   Subjective: Pt doing ok this am; some mild soreness at G tube site; feeds are running currently without difficulty   Allergies: Other, Abilify [aripiprazole], Lamictal [lamotrigine], and Wellbutrin [bupropion]  Medications: Prior to Admission medications   Medication Sig Start Date End Date Taking? Authorizing Provider  buPROPion (WELLBUTRIN XL) 150 MG 24 hr tablet TAKE 1 TABLET ONCE DAILY. 06/28/19  Yes [name], PMHNP  Cholecalciferol (VITAMIN D3 PO) Take 2,000 Units by mouth daily.   Yes [provider]  donepezil (ARICEPT) 5 MG tablet TAKE ONE TABLET AT BEDTIME. 06/28/19  Yes [name], PMHNP  escitalopram (LEXAPRO) 20 MG tablet TAKE 1 TABLET ONCE DAILY. 06/28/19  Yes [name], PMHNP  LORazepam (ATIVAN) 0.5 MG tablet TAKE ONE TABLET AT BEDTIME. Patient taking differently: Take 0.5 mg by mouth every 6 (six) hours as needed for anxiety.  06/28/19  Yes [name], PMHNP  memantine (NAMENDA) 10 MG tablet TAKE 1 TABLET BY MOUTH TWICE DAILY. 06/28/19  Yes [name], PMHNP  OLANZapine (ZYPREXA) 5 MG tablet TAKE ONE TABLET AT BEDTIME. 06/28/19  Yes [name], PMHNP  simvastatin (ZOCOR) 40 MG tablet Take 40 mg by mouth every evening.   Yes [provider]  Tamsulosin HCl (FLOMAX) 0.4 MG CAPS Take 0.4 mg by mouth daily.    Yes [provider]  vitamin B-12 (CYANOCOBALAMIN) 1000 MCG tablet Take 1,000 mcg by mouth daily.   Yes [provider]  sucralfate (CARAFATE) 1 g tablet Take 1 tablet (1 g total) by mouth 4 (four) times daily -  with meals and at bedtime. 5 min before meals for radiation induced esophagitis 07/04/19   Hayden Pedro, PA-C     Vital Signs: BP 140/86 (BP Location: Right Arm)   Pulse 67   Temp 97.8 F (36.6 C) (Oral)   Resp  14   Ht 6' 2.02" (1.88 m)   Wt 222 lb 0.1 oz (100.7 kg)   SpO2 98%   BMI 28.49 kg/m   Physical Exam awake.conversant; G tube intact, insertion site ok, mildly tender, abd soft; feeds currently running without diffculty  Imaging: IR GASTROSTOMY TUBE MOD SED  Result Date: 07/08/2019 INDICATION: 74 year old male with obstructing esophageal mass and resultant dysphagia. EXAM: Fluoroscopically guided placement of percutaneous balloon retention gastrostomy tube Interventional Radiologist:  Criselda Peaches, MD MEDICATIONS: 2 g Ancef; Antibiotics were administered within 1 hour of the procedure. ANESTHESIA/SEDATION: Versed 2 mg IV; Fentanyl 100 mcg IV Moderate Sedation Time:  16 minutes The patient was continuously monitored during the procedure by the interventional radiology nurse under my direct supervision. CONTRAST:  56mL OMNIPAQUE IOHEXOL 300 MG/ML  SOLN FLUOROSCOPY TIME:  Fluoroscopy Time: 2 minutes 36 seconds (50 mGy). COMPLICATIONS: None immediate. PROCEDURE: Informed written consent was obtained from the patient after a thorough discussion of the procedural risks, benefits and alternatives. All questions were addressed. Maximal Sterile Barrier Technique was utilized including caps, mask, sterile gowns, sterile gloves, sterile drape, hand hygiene and skin antiseptic. A timeout was performed prior to the initiation of the procedure. Maximal barrier sterile technique utilized including caps, mask, sterile gowns, sterile gloves, large sterile drape, hand hygiene, and chlorhexadine skin prep. An angled catheter was advanced over a wire under fluoroscopic guidance through the nose, down the esophagus and into the body of the stomach. The stomach was then insufflated with several 100 ml  of air. Fluoroscopy confirmed location of the gastric bubble, as well as inferior displacement of the barium stained colon. Under direct fluoroscopic guidance, two T-tacks were placed, and the anterior gastric wall drawn  up against the anterior abdominal wall. Percutaneous access was then obtained into the mid gastric body with an 18 gauge needle. Aspiration of air, and injection of contrast material under fluoroscopy confirmed needle placement. An Amplatz wire was advanced in the gastric body. The tract was then serially dilated to 70 [place] and an 18 [place] peel-away sheath was advanced into the stomach. A 16 French into it percutaneous balloon retention gastrostomy tube was then lubricated and advanced through the peel-away sheath. The peel-away sheath was discarded. The retention balloon was inflated with 6 mL saline and pulled snug against the anterior abdominal wall. The balloon retention gastrostomy tube was then secured with the external bumper and capped. A hand injection of contrast material was performed confirming that the tube is intragastric in location. One of the T tack retention sutures was cut. The second was left in place. The external bumper was secured to the anterior abdominal wall using 2 0 Prolene sutures. The patient will be observed for several hours with the newly placed tube on low wall suction to evaluate for any post procedure complication. The patient tolerated the procedure well, there is no immediate complication. IMPRESSION: Successful placement of a 30 French balloon retention percutaneous gastrostomy tube. Electronically Signed   By: Jacqulynn Cadet M.D.   On: 07/08/2019 16:21   NM PET Image Initial (PI) Skull Base To Thigh  Result Date: 07/05/2019 CLINICAL DATA:  Initial treatment strategy for esophageal cancer. EXAM: NUCLEAR MEDICINE PET SKULL BASE TO THIGH TECHNIQUE: 11.01 mCi F-18 FDG was injected intravenously. Full-ring PET imaging was performed from the skull base to thigh after the radiotracer. CT data was obtained and used for attenuation correction and anatomic localization. Fasting blood glucose: 118 mg/dl COMPARISON:  CT abdomen and pelvis 05/06/2019 FINDINGS: Mediastinal blood  pool activity: SUV max 2.81 Liver activity: SUV max NA NECK: No hypermetabolic lymph nodes in the neck. Incidental CT findings: none CHEST: Large mass in the distal esophagus with marked increased FDG uptake. Masslike thickening is circumferential (SUVmax = 24.6) (image 103, series 3 and series 4) wall thickening as much as 3.6 cm. In total the esophagus at this level measures 7.5 x 5.2 cm. Multiple mediastinal lymph nodes beginning from just below the thoracic inlet also with lymph nodes in the retrocrural region and below the diaphragm. (Image 59, series 3): High right paratracheal lymph node just below the thoracic inlet with rounded borders measuring 2.0 cm. (SUVmax = 28.1) Tiny lymph node just anterior to the aorta (image 91, series 4) difficult to visualize on CT (SUVmax = 7.1) Juxta aortic lymph node just to the left of long the anterior margin of the aorta (image 96, series 4): (SUVmax = 11 0.0) Small discrete foci of increased FDG uptake at the level of the esophageal hiatus, these appear distinct from the dominant mass, a small hepatic gastric lymph node (image 121, series 4) likely corresponds to the area of uptake seen just anterior to the aorta, perhaps with some variable respiration there is misregistration on the current study (SUVmax = 13.6 No area of increased FDG uptake within the lung parenchyma) Incidental CT findings: Lungs are clear aside from minimal scarring in the anterior right chest. No pleural effusion. Airways are patent. Calcified atheromatous plaque throughout the thoracic aorta. Calcified coronary artery disease. ABDOMEN/PELVIS:  Left para-aortic lymph node with intense FDG uptake (image 135, series 4) 10 mm (SUVmax = 20.1) hepato gastric lymph node as described. No areas of suspicious uptake within solid organs. There is an 8 mm right juxta crural lymph node (image 120, series 4) with similar uptake to the uptake seen in the hepato gastric lymph node. Incidental CT findings: CT  appearance of liver, gallbladder, spleen, pancreas and adrenal glands is normal. Nonobstructing calculus in the upper pole the right kidney. No evidence of hydronephrosis. Left ureteral calculus measures 7 mm in the distal left ureter and is not associated with hydronephrosis, unchanged compared to previous imaging. No acute gastrointestinal finding. Appendix is normal. Stool fills much of the colon. SKELETON: No focal hypermetabolic activity to suggest skeletal metastasis. Incidental CT findings: Spinal degenerative changes. IMPRESSION: 1. Signs of esophageal cancer with large esophageal mass with nodal involvement both above and below the diaphragm. 2. 7 mm distal left ureteral calculus is unchanged and does not lead to urinary tract obstruction. Electronically Signed   By: Zetta Bills M.D.   On: 07/05/2019 11:55    Labs:  CBC: Recent Labs    05/06/19 1642 07/05/19 2037 07/06/19 0615 07/08/19 0525  WBC 7.9 9.8 8.2 8.4  HGB 14.8 13.2 13.0 12.6*  HCT 45.4 40.6 40.3 38.7*  PLT 202 285 252 243    COAGS: Recent Labs    07/05/19 2037  INR 1.0    BMP: Recent Labs    05/06/19 1642 07/05/19 2037 07/06/19 0615 07/08/19 0525  NA 139 139 141 138  K 4.2 3.8 3.8 4.1  CL 103 103 107 103  CO2 26 25 24 27   GLUCOSE 101* 104* 103* 101*  BUN 21 16 14  7*  CALCIUM 9.4 10.1 9.9 9.6  CREATININE 1.50* 1.27* 1.22 1.32*  GFRNONAA 46* 56* 59* 54*  GFRAA 53* >60 >60 >60    LIVER FUNCTION TESTS: Recent Labs    05/06/19 1642 07/05/19 2037  BILITOT 1.8* 1.1  AST 21 16  ALT 20 15  ALKPHOS 51 54  PROT 7.3 6.8  ALBUMIN 4.6 3.8    Assessment and Plan: Pt with hx esophageal cancer/dysphagia; s/p perc G tube 3/29; afebrile; no new labs; ok to use tube as needed (nutrition orders as per nutrition services); pt will need to return to IR dept for t tack removal in 10-14 days   Electronically Signed: D. Rowe Robert, PA-C 07/09/2019, 10:37 AM   I spent a total of 15 minutes at the the  patient's bedside AND on the patient's hospital floor or unit, greater than 50% of which was counseling/coordinating care for percutaneous gastrostomy tube     Patient ID: David [name], male   DOB: 1946-03-15, 74 y.o.   MRN: [id number]

## 2019-07-09 NOTE — Progress Notes (Signed)
IP PROGRESS NOTE  Subjective:   [name] appears stable.  He continues to have dysphagia over the weekend.  He underwent placement of a percutaneous gastrostomy tube yesterday.  He denies pain.  Objective: Vital signs in last 24 hours: Blood pressure 128/73, pulse 76, temperature 98.4 F (36.9 C), temperature source Oral, resp. rate 14, height 6' 2.02" (1.88 m), weight 222 lb 0.1 oz (100.7 kg), SpO2 99 %.  Intake/Output from previous day: 03/29 0701 - 03/30 0700 In: 897.4 [P.O.:270; I.V.:594; IV Piggyback:33.3] Out: -   Physical Exam:   Abdomen: Soft, left upper quadrant feeding tube site with a gauze dressing    Lab Results: Recent Labs    07/08/19 0525  WBC 8.4  HGB 12.6*  HCT 38.7*  PLT 243    BMET Recent Labs    07/08/19 0525  NA 138  K 4.1  CL 103  CO2 27  GLUCOSE 101*  BUN 7*  CREATININE 1.32*  CALCIUM 9.6    No results found for: CEA1  Studies/Results: IR GASTROSTOMY TUBE MOD SED  Result Date: 07/08/2019 INDICATION: 74 year old male with obstructing esophageal mass and resultant dysphagia. EXAM: Fluoroscopically guided placement of percutaneous balloon retention gastrostomy tube Interventional Radiologist:  Criselda Peaches, MD MEDICATIONS: 2 g Ancef; Antibiotics were administered within 1 hour of the procedure. ANESTHESIA/SEDATION: Versed 2 mg IV; Fentanyl 100 mcg IV Moderate Sedation Time:  16 minutes The patient was continuously monitored during the procedure by the interventional radiology nurse under my direct supervision. CONTRAST:  27m OMNIPAQUE IOHEXOL 300 MG/ML  SOLN FLUOROSCOPY TIME:  Fluoroscopy Time: 2 minutes 36 seconds (50 mGy). COMPLICATIONS: None immediate. PROCEDURE: Informed written consent was obtained from the patient after a thorough discussion of the procedural risks, benefits and alternatives. All questions were addressed. Maximal Sterile Barrier Technique was utilized including caps, mask, sterile gowns, sterile gloves, sterile  drape, hand hygiene and skin antiseptic. A timeout was performed prior to the initiation of the procedure. Maximal barrier sterile technique utilized including caps, mask, sterile gowns, sterile gloves, large sterile drape, hand hygiene, and chlorhexadine skin prep. An angled catheter was advanced over a wire under fluoroscopic guidance through the nose, down the esophagus and into the body of the stomach. The stomach was then insufflated with several 100 ml of air. Fluoroscopy confirmed location of the gastric bubble, as well as inferior displacement of the barium stained colon. Under direct fluoroscopic guidance, two T-tacks were placed, and the anterior gastric wall drawn up against the anterior abdominal wall. Percutaneous access was then obtained into the mid gastric body with an 18 gauge needle. Aspiration of air, and injection of contrast material under fluoroscopy confirmed needle placement. An Amplatz wire was advanced in the gastric body. The tract was then serially dilated to [place] an 18 [place] sheath was advanced into the stomach. A 16 French into it percutaneous balloon retention gastrostomy tube was then lubricated and advanced through the peel-away sheath. The peel-away sheath was discarded. The retention balloon was inflated with 6 mL saline and pulled snug against the anterior abdominal wall. The balloon retention gastrostomy tube was then secured with the external bumper and capped. A hand injection of contrast material was performed confirming that the tube is intragastric in location. One of the T tack retention sutures was cut. The second was left in place. The external bumper was secured to the anterior abdominal wall using 2 0 Prolene sutures. The patient will be observed for several hours with the newly  placed tube on low wall suction to evaluate for any post procedure complication. The patient tolerated the procedure well, there is no immediate complication. IMPRESSION:  Successful placement of a 22 French balloon retention percutaneous gastrostomy tube. Electronically Signed   By: Jacqulynn Cadet M.D.   On: 07/08/2019 16:21    Medications: I have reviewed the patient's current medications.  Assessment/Plan: 1. Esophagus cancer ? CT 05/06/2019-circumferential thickening of the distal esophagus, mild splenomegaly, bladder wall thickening, enlarged prostate ? Upper endoscopy 06/11/2019-ulcerated mass at the lower esophagus, 36 cm from the incisors, partially obstructing, biopsy revealed atypical cells-reactive change versus poorly differentiated neoplasm, outside consultation at [facility] [place] on this biopsy and a repeat biopsy 06/21/2019 confirmed a poorly differentiated malignant neoplasm ? Repeat endoscopy 06/21/2019-partially obstructing esophageal tumor found in the lower third of the esophagus ? PET scan 07/05/2019-large esophageal mass with nodal involvement both above and below the diaphragm including a left periaortic node 2. Dementia 3. Anxiety/depression 4. Sleep apnea 5. BPH 6. Dysphagia secondary to #1  Gastrostomy tube placement 07/08/2019 7. Hyperlipidemia  David [name] appears stable.  He has been diagnosed with advanced stage esophagus cancer.  He underwent placement of a feeding tube yesterday.  He started tube feedings today.  I discussed his current status and treatment options with his wife by telephone.  His case will be presented at the GI tumor conference tomorrow.  We will discuss the possibility of a lymph node biopsy to obtain more tissue for molecular testing, PD1 testing, and HER-2 testing.  He will be scheduled for outpatient follow-up at the Cancer center next week.  We will decide on treatment with palliative radiation, weekly chemotherapy/radiation, or systemic therapy based on the conference discussion and above testing results.  I appreciate the care from Dr. Rodena Piety.  LOS: 4 days   Betsy Coder, MD   07/09/2019, 5:32 PM

## 2019-07-09 NOTE — Evaluation (Signed)
Physical Therapy Evaluation Patient Details Name: David [name] MRN: [id number] DOB: [birth year] Today's Date: 07/09/2019   History of Present Illness  74 y.o. male with medical history significant for esophageal cancer diagnosed 04/2019, dementia, COPD, CKD stage III, OSA intolerable of CPAP, hypertension and depression/anxiety who presents as a direct admission from outpatient oncology office for concerns of worsening dysphagia.  Pt s/p perc G tube 3/29  Clinical Impression  Pt admitted with above diagnosis.  Pt currently with functional limitations due to the deficits listed below (see PT Problem List). Pt will benefit from skilled PT to increase their independence and safety with mobility to allow discharge to the venue listed below.  Pt assisted with ambulating in hallway and tolerated good distance pushing IV pole.  Pt states he typically ambulates on a trail new his home daily (2-3 miles) (however also with hx of dementia and memory loss, no family present, so uncertain of accuracy).       Follow Up Recommendations No PT follow up;Supervision - Intermittent    Equipment Recommendations  None recommended by PT    Recommendations for Other Services       Precautions / Restrictions Precautions Precaution Comments: G tube Restrictions Weight Bearing Restrictions: No      Mobility  Bed Mobility Overal bed mobility: Needs Assistance Bed Mobility: Supine to Sit;Sit to Supine     Supine to sit: Supervision;HOB elevated Sit to supine: Supervision;HOB elevated      Transfers Overall transfer level: Needs assistance Equipment used: None Transfers: Sit to/from Stand Sit to Stand: Min guard         General transfer comment: min/guard for safety  Ambulation/Gait Ambulation/Gait assistance: Min guard Gait Distance (Feet): 300 Feet Assistive device: IV Pole Gait Pattern/deviations: Step-through pattern;Decreased stride length     General Gait Details: pt pushed IV  pole, good pace, no unsteadiness or LOB observed, pt denies any symptoms  Stairs            Wheelchair Mobility    Modified Rankin (Stroke Patients Only)       Balance Overall balance assessment: No apparent balance deficits (not formally assessed)                                           Pertinent Vitals/Pain Pain Assessment: No/denies pain    Home Living Family/patient expects to be discharged to:: Private residence Living Arrangements: Spouse/significant other     Home Access: Stairs to enter   Technical brewer of Steps: reports living in :second floor unit" Home Layout: One level Home Equipment: None      Prior Function Level of Independence: Independent         Comments: reports daily ambulation on trial near home - 2-3 miles     Hand Dominance        Extremity/Trunk Assessment        Lower Extremity Assessment Lower Extremity Assessment: Generalized weakness       Communication   Communication: No difficulties  Cognition Arousal/Alertness: Awake/alert Behavior During Therapy: WFL for tasks assessed/performed Overall Cognitive Status: No family/caregiver present to determine baseline cognitive functioning                                 General Comments: hx dementia, memory loss; pt slow to answer questions however responses  are appropriate      General Comments      Exercises     Assessment/Plan    PT Assessment Patient needs continued PT services  PT Problem List Decreased strength;Decreased mobility;Decreased knowledge of use of DME       PT Treatment Interventions Gait training;Therapeutic exercise;DME instruction;Therapeutic activities;Functional mobility training;Stair training;Balance training;Patient/family education    PT Goals (Current goals can be found in the Care Plan section)  Acute Rehab PT Goals PT Goal Formulation: With patient Time For Goal Achievement:  07/16/19 Potential to Achieve Goals: Good    Frequency Min 3X/week   Barriers to discharge        Co-evaluation               AM-PAC PT "6 Clicks" Mobility  Outcome Measure Help needed turning from your back to your side while in a flat bed without using bedrails?: None Help needed moving from lying on your back to sitting on the side of a flat bed without using bedrails?: None Help needed moving to and from a bed to a chair (including a wheelchair)?: A Little Help needed standing up from a chair using your arms (e.g., wheelchair or bedside chair)?: A Little Help needed to walk in hospital room?: A Little Help needed climbing 3-5 steps with a railing? : A Little 6 Click Score: 20    End of Session Equipment Utilized During Treatment: Gait belt Activity Tolerance: Patient tolerated treatment well Patient left: in bed;with call bell/phone within reach;with bed alarm set   PT Visit Diagnosis: Difficulty in walking, not elsewhere classified (R26.2)    Time: 7169-6789 PT Time Calculation (min) (ACUTE ONLY): 17 min   Charges:   PT Evaluation $PT Eval Low Complexity: 1 Low     Kati PT, DPT Acute Rehabilitation Services Office: [phone]  Trena Platt 07/09/2019, 12:39 PM

## 2019-07-09 NOTE — Evaluation (Signed)
Occupational Therapy Evaluation Patient Details Name: David [name] MRN: [id number] DOB: 07-Apr-1946 Today's Date: 07/09/2019    History of Present Illness 74 y.o. male with medical history significant for esophageal cancer diagnosed 04/2019, dementia, COPD, CKD stage III, OSA intolerable of CPAP, hypertension and depression/anxiety who presents as a direct admission from outpatient oncology office for concerns of worsening dysphagia.  Pt s/p perc G tube 3/29   Clinical Impression   This 74 y/o male presents with the above. PTA pt reports he lives alone and was performing ADL, mobility and most iADL tasks independently. Pt overall performing room level mobility and ADL tasks including toileting and standing grooming ADL at supervision level today, without AD and while pushing IV pole. Pt reports he is "groggy" this PM but overall tolerating session well. He reports he has family that live close by including children and "the mother of his children" who can check in and assist with daily tasks PRN (discussed having some assist with iADL tasks such as medication management as Pt with history of dementia and endorses occasionally feels unsure with managing medications). Pt will benefit from continued acute OT services; given history of dementia and current status feel he will benefit from higher level cognitive assessment to further determine level of supervision required at time of discharge (24hr vs intermittent). Will continue to follow/assess.     Follow Up Recommendations  Supervision/Assistance - 24 hour(24hr vs intermittent assist )    Equipment Recommendations  None recommended by OT           Precautions / Restrictions Precautions Precaution Comments: G tube Restrictions Weight Bearing Restrictions: No      Mobility Bed Mobility Overal bed mobility: Needs Assistance Bed Mobility: Supine to Sit;Sit to Supine     Supine to sit: Supervision;HOB elevated Sit to supine:  Supervision;HOB elevated   General bed mobility comments: for lines/safety  Transfers Overall transfer level: Needs assistance Equipment used: None Transfers: Sit to/from Stand Sit to Stand: Supervision         General transfer comment: for safety    Balance Overall balance assessment: No apparent balance deficits (not formally assessed)                                         ADL either performed or assessed with clinical judgement   ADL Overall ADL's : Needs assistance/impaired   Eating/Feeding Details (indicate cue type and reason): pt with PEG Grooming: Wash/dry hands;Supervision/safety;Standing   Upper Body Bathing: Supervision/ safety;Sitting   Lower Body Bathing: Supervison/ safety;Sit to/from stand   Upper Body Dressing : Set up;Supervision/safety;Sitting   Lower Body Dressing: Supervision/safety;Min guard   Toilet Transfer: Supervision/safety;Ambulation   Toileting- Clothing Manipulation and Hygiene: Supervision/safety;Sit to/from stand Toileting - Clothing Manipulation Details (indicate cue type and reason): pt standing to void bladder with distant supervision, managing gown/underwear      Functional mobility during ADLs: Supervision/safety(pushing IV pole) General ADL Comments: will benefit from higher level cognitive assessment                         Pertinent Vitals/Pain Pain Assessment: Faces Faces Pain Scale: Hurts a little bit Pain Location: general discomfort at PEG site  Pain Descriptors / Indicators: Discomfort;Grimacing Pain Intervention(s): Limited activity within patient's tolerance;Monitored during session;Repositioned     Hand Dominance     Extremity/Trunk Assessment Upper Extremity Assessment  Upper Extremity Assessment: Overall WFL for tasks assessed   Lower Extremity Assessment Lower Extremity Assessment: Defer to PT evaluation   Cervical / Trunk Assessment Cervical / Trunk Assessment: Normal    Communication Communication Communication: No difficulties   Cognition Arousal/Alertness: Awake/alert Behavior During Therapy: WFL for tasks assessed/performed Overall Cognitive Status: History of cognitive impairments - at baseline                                 General Comments: pt with hx of dementia and memory loss, able to verbalize to therapist how he manages medications, but endorses he is fearful of mismanaging medication due to recently starting new meds as well as due to hospitalization (and RN currently managing medications vs him completing on his own)    General Comments       Exercises     Shoulder Instructions      Home Living Family/patient expects to be discharged to:: Private residence Living Arrangements: Alone Available Help at Discharge: Family;Available PRN/intermittently   Home Access: Stairs to enter Entrance Stairs-Number of Steps: reports living in :second floor unit"   Home Layout: One level     Bathroom Shower/Tub: Tub/shower unit;Walk-in shower   Bathroom Toilet: Standard     Home Equipment: None   Additional Comments: pt reports he lives alone, reports "the mother of his children" able to assist some at discharge as well as his children       Prior Functioning/Environment Level of Independence: Independent        Comments: reports daily ambulation on trial near home - 2-3 miles        OT Problem List: Decreased activity tolerance;Decreased cognition;Decreased knowledge of use of DME or AE;Pain;Decreased strength      OT Treatment/Interventions: Self-care/ADL training;Therapeutic exercise;Neuromuscular education;DME and/or AE instruction;Therapeutic activities;Cognitive remediation/compensation;Patient/family education;Balance training    OT Goals(Current goals can be found in the care plan section) Acute Rehab OT Goals Patient Stated Goal: return home, to get some rest OT Goal Formulation: With patient Time For  Goal Achievement: 07/23/19 Potential to Achieve Goals: Good  OT Frequency: Min 2X/week   Barriers to D/C:            Co-evaluation              AM-PAC OT "6 Clicks" Daily Activity     Outcome Measure Help from another person eating meals?: None Help from another person taking care of personal grooming?: None Help from another person toileting, which includes using toliet, bedpan, or urinal?: None Help from another person bathing (including washing, rinsing, drying)?: A Little Help from another person to put on and taking off regular upper body clothing?: None Help from another person to put on and taking off regular lower body clothing?: A Little 6 Click Score: 22   End of Session Nurse Communication: Mobility status  Activity Tolerance: Patient tolerated treatment well Patient left: in bed;with call bell/phone within reach;with bed alarm set  OT Visit Diagnosis: Muscle weakness (generalized) (M62.81);Other symptoms and signs involving cognitive function                Time: 8453-6468 OT Time Calculation (min): 20 min Charges:  OT General Charges $OT Visit: 1 Visit OT Evaluation $OT Eval Moderate Complexity: [place], OT Acute Rehabilitation Services Pager [phone] Office [phone]   Raymondo Band 07/09/2019, 5:06 PM

## 2019-07-10 ENCOUNTER — Other Ambulatory Visit: Payer: Self-pay

## 2019-07-10 ENCOUNTER — Encounter: Payer: Medicare Other | Admitting: Nutrition

## 2019-07-10 ENCOUNTER — Encounter: Payer: Self-pay | Admitting: *Deleted

## 2019-07-10 LAB — CBC
HCT: 39 % (ref 39.0–52.0)
Hemoglobin: 12.7 g/dL — ABNORMAL LOW (ref 13.0–17.0)
MCH: 29.5 pg (ref 26.0–34.0)
MCHC: 32.6 g/dL (ref 30.0–36.0)
MCV: 90.7 fL (ref 80.0–100.0)
Platelets: 243 10*3/uL (ref 150–400)
RBC: 4.3 MIL/uL (ref 4.22–5.81)
RDW: 12 % (ref 11.5–15.5)
WBC: 7.9 10*3/uL (ref 4.0–10.5)
nRBC: 0 % (ref 0.0–0.2)

## 2019-07-10 LAB — BASIC METABOLIC PANEL
Anion gap: 6 (ref 5–15)
BUN: 10 mg/dL (ref 8–23)
CO2: 27 mmol/L (ref 22–32)
Calcium: 9.8 mg/dL (ref 8.9–10.3)
Chloride: 104 mmol/L (ref 98–111)
Creatinine, Ser: 0.93 mg/dL (ref 0.61–1.24)
GFR calc Af Amer: >60 mL/min (ref >60)
GFR calc non Af Amer: >60 mL/min (ref >60)
Glucose, Bld: 133 mg/dL — ABNORMAL HIGH (ref 70–99)
Potassium: 3.7 mmol/L (ref 3.5–5.1)
Sodium: 137 mmol/L (ref 135–145)

## 2019-07-10 LAB — PHOSPHORUS: Phosphorus: 2.7 mg/dL (ref 2.5–4.6)

## 2019-07-10 LAB — MAGNESIUM: Magnesium: 2.2 mg/dL (ref 1.7–2.4)

## 2019-07-10 NOTE — Evaluation (Addendum)
Occupational Therapy Treatment Patient Details Name: David [name] MRN: [id number] DOB: 1946-02-04 Today's Date: 07/10/2019    History of Present Illness 74 y.o. male with medical history significant for esophageal cancer diagnosed 04/2019, dementia, COPD, CKD stage III, OSA intolerable of CPAP, hypertension and depression/anxiety who presents as a direct admission from outpatient oncology office for concerns of worsening dysphagia.  Pt s/p perc G tube 3/29   Clinical Impression   Pt presents supine in bed sleeping, reports he again feels "groggy" this AM but is agreeable to working with OT. Pt performing room level mobility and toileting ADL task at supervision level today. Additional focus on higher level cognitive assessment. Administered Short Blessed Test and the Pill Box Test during session. Pt receiving a score of 6/28 on the Short Blessed Test where a score of 5-9 is considered "questionable impairment" (10 or greater is consistent with impairment consistent with dementia). Pt with errors in recall of year (stating 2020 vs 2021 and x1 error with STM recall). Pt was able to complete the Pill Box Test in approx 8 min with 0 errors, receiving a passing score. Overall pt performing both cognitive assessments with fair-good scores. Given current scores and pt's current mobility status feel pt will be safe to continue managing at home with intermittent assist (vs full 24hr assist) including intermittent assist for higher level iADL tasks. Will continue to follow while pt remains in acute setting.     Follow Up Recommendations  Supervision - Intermittent;No OT follow up    Equipment Recommendations  None recommended by OT           Precautions / Restrictions Precautions Precaution Comments: G tube Restrictions Weight Bearing Restrictions: No      Mobility Bed Mobility Overal bed mobility: Modified Independent                Transfers Overall transfer level: Modified  independent Equipment used: None                      ADL either performed or assessed with clinical judgement   ADL Overall ADL's : Needs assistance/impaired                             Toileting- Clothing Manipulation and Hygiene: Supervision/safety;Sit to/from stand Toileting - Clothing Manipulation Details (indicate cue type and reason): pt standing to void bladder with distant supervision, managing gown/underwear      Functional mobility during ADLs: Supervision/safety       Vision         Perception     Praxis      Pertinent Vitals/Pain Pain Assessment: Faces Faces Pain Scale: Hurts a little bit Pain Location: general discomfort at PEG site  Pain Descriptors / Indicators: Discomfort;Grimacing Pain Intervention(s): Limited activity within patient's tolerance;Monitored during session;Repositioned     Hand Dominance     Extremity/Trunk Assessment             Communication     Cognition Arousal/Alertness: Awake/alert Behavior During Therapy: WFL for tasks assessed/performed Overall Cognitive Status: No family/caregiver present to determine baseline cognitive functioning                                 General Comments: per chart hx of dementia, administered Short Blessed Test and Pill Box Test during this session, see ADL comments/clinical impression for further detail  General Comments       Exercises     Shoulder Instructions      Home Living                                          Prior Functioning/Environment                   OT Problem List:        OT Treatment/Interventions:      OT Goals(Current goals can be found in the care plan section) Acute Rehab OT Goals Patient Stated Goal: return home, to get some rest OT Goal Formulation: With patient Time For Goal Achievement: 07/23/19 Potential to Achieve Goals: Good ADL Goals Pt Will Perform Grooming: with modified  independence;standing Pt Will Perform Lower Body Dressing: with modified independence;sit to/from stand Pt Will Transfer to Toilet: with modified independence;ambulating Pt Will Perform Toileting - Clothing Manipulation and hygiene: with modified independence;sit to/from stand Additional ADL Goal #1: Pt will participate in higher level cognitive assessment to maximize safety with iADL tasks.  OT Frequency: Min 2X/week   Barriers to D/C:            Co-evaluation              AM-PAC OT "6 Clicks" Daily Activity     Outcome Measure Help from another person eating meals?: None Help from another person taking care of personal grooming?: None Help from another person toileting, which includes using toliet, bedpan, or urinal?: None Help from another person bathing (including washing, rinsing, drying)?: A Little Help from another person to put on and taking off regular upper body clothing?: None Help from another person to put on and taking off regular lower body clothing?: A Little 6 Click Score: 22   End of Session Nurse Communication: Mobility status  Activity Tolerance: Patient tolerated treatment well Patient left: in bed;with call bell/phone within reach;with bed alarm set  OT Visit Diagnosis: Muscle weakness (generalized) (M62.81);Other symptoms and signs involving cognitive function                Time: 1021-1055 OT Time Calculation (min): 34 min Charges:  OT General Charges $OT Visit: 1 Visit OT Treatments $Self Care/Home Management : 8-22 mins $Cognitive Funtion inital: Initial 15 mins  Lou Cal, OT Acute Rehabilitation Services Pager [phone] Office [place] 07/10/2019, 1:34 PM

## 2019-07-10 NOTE — Progress Notes (Signed)
Alerted by nursing staff that pt visitor had concerns about soiled equipment in the room.  I visited pt room and the visitor showed me the soiled equipment and I did observed old stool on the frame of the bedside commode in the bathroom and the sanitary strip was still in place.  In addition, visitor states on admission she observed old blood on the call light, so she cleaned and sanitized.  Service recovery provided, soiled BSC removed from the room and EVS cleaned the room and patient bed.  No other issues at this time.

## 2019-07-10 NOTE — Progress Notes (Addendum)
PROGRESS NOTE    David [name]  JKK:[id number] DOB: Aug 02, 1945 DOA: 07/05/2019 PCP: Leighton Ruff, MD   Brief Narrative:73 y.o.malewith medical history significant foresophageal cancer diagnosed 04/2019, dementia, COPD, CKD stage III, OSA intolerable of CPAP, hypertension and depression/anxiety who presents as a direct admission from outpatient oncology office for concerns of worsening dysphagia.  Patient presented to his oncologist today for discussion of treatment plan but was noted to have worsening dysphagiawith concerns of being able to maintain good nutritional statusand plan were made for him to be admitted for arrangement of a feeding tube and IV fluid hydration. Patient reports that he can only tolerate food after he has masticated liquid and even then he might sometimes vomit due to nausea. Currently, he denies any pain. No chest pain, shortness of breath. No nausea, vomiting or diarrhea.  He reports marijuana use in the past. Denies any alcohol or tobacco use.  Assessment & Plan:   Principal Problem:   Dysphagia Active Problems:   Major depressive disorder, recurrent episode, severe (HCC)   Generalized anxiety disorder   Dementia without behavioral disturbance (HCC)   AKI (acute kidney injury) ([place])   Esophageal cancer ([place])   #1  Advanced esophageal cancer-patient admitted with a dysphagia.  He is status post PEG tube placement 07/08/2019.  Tube feeds started 07/09/2019.  He will be discharged on bolus feedings of Osmolite 1.55 times a day with 50 mL of free water before and 50 mL of free water after bolus.  Continue prostat twice a day. Closely monitoring for refeeding syndrome.  Potassium magnesium phosphorus normal today.  Recheck levels in a.m.  Discussed with Dr. Benay Spice.  Plan to get a lymph node biopsy however IR discussed with Dr. Benay Spice and reported that it is too risky to do a retroperitoneal lymph node biopsy due to surrounding blood  vessels. Will discuss with GI to see if endoscopic ultrasound can be done to get a paratracheal lymph node biopsy.  [facility] consulted.    CT scan May 06, 2019 shows circumferential thickening of the distal esophagus with mild splenomegaly bladder wall thickening and enlarged prostate.  Upper endoscopy 06/11/2019 shows ulcerated mass of the lower esophagus partially obstructing biopsy confirmed as poorly differentiated malignantneoplasm. Repeat endoscopy 06/21/2019 shows partially obstructing esophageal tumor at the lower third of the esophagus.  PET scan 07/05/2019 shows large esophageal mass with nodal involvement both above and below the diaphragm  Covid negative  #2 AKI resolved with IV fluids creatinine 0.93 down from 1.32.  #3 history of dementia on Aricept Namenda and Zyprexa  #4 history of depression and anxiety on Lexapro and Wellbutrin and as needed Ativan  #5 obstructive sleep apnea patient is intolerant to CPAP  #6 benign prostatic hypertrophy on Flomax  #7 hyperlipidemiaon zocor  Nutrition Problem: Increased nutrient needs Etiology: cancer and cancer related treatments, catabolic illness     Signs/Symptoms: estimated needs    Interventions: Refer to RD note for recommendations  Estimated body mass index is 28.49 kg/m as calculated from the following:   Height as of this encounter: 6' 2.02" (1.88 m).   Weight as of this encounter: 100.7 kg.  DVT prophylaxis:SCD Code Status:Full code Family Communication:Discussed with his wife.  Disposition Plan:Patient came from home Plan is to discharge him home Barriers to discharge--patient being started on tube feeds for the first time need to monitor for refeeding syndrome.  Patient lives alone and has no safe discharge planning at this time.  Consultants:  Interventional radiology  Procedures:None Antimicrobials:None   Subjective: He is resting in bed reports he is very weak and tired and  wants to sleep  Objective: Vitals:   07/09/19 0527 07/09/19 1504 07/09/19 2110 07/10/19 0401  BP: 140/86 128/73 (!) 149/80 (!) 154/79  Pulse: 67 76 71 75  Resp: 14 14 16 14   Temp: 97.8 F (36.6 C) 98.4 F (36.9 C) 99.1 F (37.3 C) 97.7 F (36.5 C)  TempSrc: Oral Oral Oral Oral  SpO2: 98% 99% 96% 98%  Weight:      Height:        Intake/Output Summary (Last 24 hours) at 07/10/2019 1259 Last data filed at 07/10/2019 0600 Gross per 24 hour  Intake 2413.67 ml  Output --  Net 2413.67 ml   Filed Weights   07/08/19 0851  Weight: 100.7 kg    Examination:  General exam: Appears calm and comfortable  Respiratory system: Clear to auscultation. Respiratory effort normal. Cardiovascular system: S1 & S2 heard, RRR. No JVD, murmurs, rubs, gallops or clicks. No pedal edema. Gastrointestinal system: Abdomen is nondistended, soft and nontender. No organomegaly or masses felt. Normal bowel sounds heard. Central nervous system: Sleeping no focal neurological deficits. Extremities: Symmetric 5 x 5 power. Skin: No rashes, lesions or ulcers Psychiatry: Unable to assess.     Data Reviewed: I have personally reviewed following labs and imaging studies  CBC: Recent Labs  Lab 07/05/19 2037 07/06/19 0615 07/08/19 0525 07/10/19 0526  WBC 9.8 8.2 8.4 7.9  HGB 13.2 13.0 12.6* 12.7*  HCT 40.6 40.3 38.7* 39.0  MCV 92.3 92.2 92.8 90.7  PLT 285 252 243 852   Basic Metabolic Panel: Recent Labs  Lab 07/05/19 2037 07/06/19 0615 07/08/19 0525 07/10/19 0526  NA 139 141 138 137  K 3.8 3.8 4.1 3.7  CL 103 107 103 104  CO2 25 24 27 27   GLUCOSE 104* 103* 101* 133*  BUN 16 14 7* 10  CREATININE 1.27* 1.22 1.32* 0.93  CALCIUM 10.1 9.9 9.6 9.8  MG  --   --   --  2.2  PHOS  --   --   --  2.7   GFR: Estimated Creatinine Clearance: 91 mL/min (by C-G formula based on SCr of 0.93 mg/dL). Liver Function Tests: Recent Labs  Lab 07/05/19 2037  AST 16  ALT 15  ALKPHOS 54  BILITOT 1.1  PROT  6.8  ALBUMIN 3.8   No results for input(s): LIPASE, AMYLASE in the last 168 hours. No results for input(s): AMMONIA in the last 168 hours. Coagulation Profile: Recent Labs  Lab 07/05/19 2037  INR 1.0   Cardiac Enzymes: No results for input(s): CKTOTAL, CKMB, CKMBINDEX, TROPONINI in the last 168 hours. BNP (last 3 results) No results for input(s): PROBNP in the last 8760 hours. HbA1C: No results for input(s): HGBA1C in the last 72 hours. CBG: Recent Labs  Lab 07/05/19 0947  GLUCAP 118*   Lipid Profile: No results for input(s): CHOL, HDL, LDLCALC, TRIG, CHOLHDL, LDLDIRECT in the last 72 hours. Thyroid Function Tests: No results for input(s): TSH, T4TOTAL, FREET4, T3FREE, THYROIDAB in the last 72 hours. Anemia Panel: No results for input(s): VITAMINB12, FOLATE, FERRITIN, TIBC, IRON, RETICCTPCT in the last 72 hours. Sepsis Labs: No results for input(s): PROCALCITON, LATICACIDVEN in the last 168 hours.  Recent Results (from the past 240 hour(s))  SARS CORONAVIRUS 2 (TAT 6-24 HRS) Nasopharyngeal Nasopharyngeal Swab     Status: None   Collection Time: 07/05/19  8:28 PM   Specimen: Nasopharyngeal  Swab  Result Value Ref Range Status   SARS Coronavirus 2 NEGATIVE NEGATIVE Final    Comment: (NOTE) SARS-CoV-2 target nucleic acids are NOT DETECTED. The SARS-CoV-2 RNA is generally detectable in upper and lower respiratory specimens during the acute phase of infection. Negative results do not preclude SARS-CoV-2 infection, do not rule out co-infections with other pathogens, and should not be used as the sole basis for treatment or other patient management decisions. Negative results must be combined with clinical observations, patient history, and epidemiological information. The expected result is Negative. Fact Sheet for Patients: SugarRoll.be Fact Sheet for Healthcare Providers: https://www.woods-.com/ This test is not yet  approved or cleared by the [place] FDA and  has been authorized for detection and/or diagnosis of SARS-CoV-2 by FDA under an Emergency Use Authorization (EUA). This EUA will remain  in effect (meaning this test can be used) for the duration of the COVID-19 declaration under Section 56 4(b)(1) of the Act, 21 U.S.C. section 360bbb-3(b)(1), unless the authorization is terminated or revoked sooner. Performed at [facility], [place] [address] [place], [place] [address]          Radiology Studies: IR GASTROSTOMY TUBE MOD SED  Result Date: 07/08/2019 INDICATION: 74 year old male with obstructing esophageal mass and resultant dysphagia. EXAM: Fluoroscopically guided placement of percutaneous balloon retention gastrostomy tube Interventional Radiologist:  Criselda Peaches, MD MEDICATIONS: 2 g Ancef; Antibiotics were administered within 1 hour of the procedure. ANESTHESIA/SEDATION: Versed 2 mg IV; Fentanyl 100 mcg IV Moderate Sedation Time:  16 minutes The patient was continuously monitored during the procedure by the interventional radiology nurse under my direct supervision. CONTRAST:  40mL OMNIPAQUE IOHEXOL 300 MG/ML  SOLN FLUOROSCOPY TIME:  Fluoroscopy Time: 2 minutes 36 seconds (50 mGy). COMPLICATIONS: None immediate. PROCEDURE: Informed written consent was obtained from the patient after a thorough discussion of the procedural risks, benefits and alternatives. All questions were addressed. Maximal Sterile Barrier Technique was utilized including caps, mask, sterile gowns, sterile gloves, sterile drape, hand hygiene and skin antiseptic. A timeout was performed prior to the initiation of the procedure. Maximal barrier sterile technique utilized including caps, mask, sterile gowns, sterile gloves, large sterile drape, hand hygiene, and chlorhexadine skin prep. An angled catheter was advanced over a wire under fluoroscopic guidance through the nose, down the esophagus and into the body of  the stomach. The stomach was then insufflated with several 100 ml of air. Fluoroscopy confirmed location of the gastric bubble, as well as inferior displacement of the barium stained colon. Under direct fluoroscopic guidance, two T-tacks were placed, and the anterior gastric wall drawn up against the anterior abdominal wall. Percutaneous access was then obtained into the mid gastric body with an 18 gauge needle. Aspiration of air, and injection of contrast material under fluoroscopy confirmed needle placement. An Amplatz wire was advanced in the gastric body. The tract was then serially dilated to 35 [place] and an 18 [place] peel-away sheath was advanced into the stomach. A 16 French into it percutaneous balloon retention gastrostomy tube was then lubricated and advanced through the peel-away sheath. The peel-away sheath was discarded. The retention balloon was inflated with 6 mL saline and pulled snug against the anterior abdominal wall. The balloon retention gastrostomy tube was then secured with the external bumper and capped. A hand injection of contrast material was performed confirming that the tube is intragastric in location. One of the T tack retention sutures was cut. The second was left in place. The external bumper  was secured to the anterior abdominal wall using 2 0 Prolene sutures. The patient will be observed for several hours with the newly placed tube on low wall suction to evaluate for any post procedure complication. The patient tolerated the procedure well, there is no immediate complication. IMPRESSION: Successful placement of a 10 French balloon retention percutaneous gastrostomy tube. Electronically Signed   By: Jacqulynn Cadet M.D.   On: 07/08/2019 16:21        Scheduled Meds: . buPROPion  150 mg Oral Daily  . cholecalciferol  2,000 Units Oral Daily  . donepezil  5 mg Oral QHS  . escitalopram  20 mg Oral Daily  . feeding supplement (OSMOLITE 1.5 CAL)  1,000 mL Per Tube Q24H  .  feeding supplement (PRO-STAT SUGAR FREE 64)  30 mL Oral BID  . free water  100 mL Per Tube Q4H  . memantine  10 mg Oral BID  . OLANZapine  5 mg Oral QHS  . simvastatin  40 mg Oral QPM  . tamsulosin  0.4 mg Oral Daily  . vitamin B-12  1,000 mcg Oral Daily   Continuous Infusions: . sodium chloride 75 mL/hr at 07/09/19 2255     LOS: 5 days      Georgette Shell, MD  07/10/2019, 12:59 PM

## 2019-07-10 NOTE — Progress Notes (Signed)
IP PROGRESS NOTE  Subjective:   [name] appears stable.  No complaint this morning..  Objective: Vital signs in last 24 hours: Blood pressure (!) 154/79, pulse 75, temperature 97.7 F (36.5 C), temperature source Oral, resp. rate 14, height 6' 2.02" (1.88 m), weight 222 lb 0.1 oz (100.7 kg), SpO2 98 %.  Intake/Output from previous day: 03/30 0701 - 03/31 0700 In: 2413.7 [P.O.:128; I.V.:1427.8; NG/GT:857.8] Out: 240 [Urine:240]  Physical Exam: Not performed today    Lab Results: Recent Labs    07/08/19 0525 07/10/19 0526  WBC 8.4 7.9  HGB 12.6* 12.7*  HCT 38.7* 39.0  PLT 243 243    BMET Recent Labs    07/08/19 0525 07/10/19 0526  NA 138 137  K 4.1 3.7  CL 103 104  CO2 27 27  GLUCOSE 101* 133*  BUN 7* 10  CREATININE 1.32* 0.93  CALCIUM 9.6 9.8    No results found for: CEA1  Studies/Results: IR GASTROSTOMY TUBE MOD SED  Result Date: 07/08/2019 INDICATION: 74 year old male with obstructing esophageal mass and resultant dysphagia. EXAM: Fluoroscopically guided placement of percutaneous balloon retention gastrostomy tube Interventional Radiologist:  Criselda Peaches, MD MEDICATIONS: 2 g Ancef; Antibiotics were administered within 1 hour of the procedure. ANESTHESIA/SEDATION: Versed 2 mg IV; Fentanyl 100 mcg IV Moderate Sedation Time:  16 minutes The patient was continuously monitored during the procedure by the interventional radiology nurse under my direct supervision. CONTRAST:  37m OMNIPAQUE IOHEXOL 300 MG/ML  SOLN FLUOROSCOPY TIME:  Fluoroscopy Time: 2 minutes 36 seconds (50 mGy). COMPLICATIONS: None immediate. PROCEDURE: Informed written consent was obtained from the patient after a thorough discussion of the procedural risks, benefits and alternatives. All questions were addressed. Maximal Sterile Barrier Technique was utilized including caps, mask, sterile gowns, sterile gloves, sterile drape, hand hygiene and skin antiseptic. A timeout was performed prior to  the initiation of the procedure. Maximal barrier sterile technique utilized including caps, mask, sterile gowns, sterile gloves, large sterile drape, hand hygiene, and chlorhexadine skin prep. An angled catheter was advanced over a wire under fluoroscopic guidance through the nose, down the esophagus and into the body of the stomach. The stomach was then insufflated with several 100 ml of air. Fluoroscopy confirmed location of the gastric bubble, as well as inferior displacement of the barium stained colon. Under direct fluoroscopic guidance, two T-tacks were placed, and the anterior gastric wall drawn up against the anterior abdominal wall. Percutaneous access was then obtained into the mid gastric body with an 18 gauge needle. Aspiration of air, and injection of contrast material under fluoroscopy confirmed needle placement. An Amplatz wire was advanced in the gastric body. The tract was then serially dilated to [place] an 18 [place] sheath was advanced into the stomach. A 16 French into it percutaneous balloon retention gastrostomy tube was then lubricated and advanced through the peel-away sheath. The peel-away sheath was discarded. The retention balloon was inflated with 6 mL saline and pulled snug against the anterior abdominal wall. The balloon retention gastrostomy tube was then secured with the external bumper and capped. A hand injection of contrast material was performed confirming that the tube is intragastric in location. One of the T tack retention sutures was cut. The second was left in place. The external bumper was secured to the anterior abdominal wall using 2 0 Prolene sutures. The patient will be observed for several hours with the newly placed tube on low wall suction to evaluate for any post procedure complication.  The patient tolerated the procedure well, there is no immediate complication. IMPRESSION: Successful placement of a 37 French balloon retention percutaneous  gastrostomy tube. Electronically Signed   By: Jacqulynn Cadet M.D.   On: 07/08/2019 16:21    Medications: I have reviewed the patient's current medications.  Assessment/Plan: 1. Esophagus cancer ? CT 05/06/2019-circumferential thickening of the distal esophagus, mild splenomegaly, bladder wall thickening, enlarged prostate ? Upper endoscopy 06/11/2019-ulcerated mass at the lower esophagus, 36 cm from the incisors, partially obstructing, biopsy revealed atypical cells-reactive change versus poorly differentiated neoplasm, outside consultation at [facility] on this biopsy and a repeat biopsy 06/21/2019 confirmed a poorly differentiated malignant neoplasm ? Repeat endoscopy 06/21/2019-partially obstructing esophageal tumor found in the lower third of the esophagus ? PET scan 07/05/2019-large esophageal mass with nodal involvement both above and below the diaphragm including a left periaortic node 2. Dementia 3. Anxiety/depression 4. Sleep apnea 5. BPH 6. Dysphagia secondary to #1  Gastrostomy tube placement 07/08/2019 7. Hyperlipidemia  David [name] appears stable.  He is tolerating the tube feedings well.  I presented his case at the GI tumor conference this morning.  We reviewed the PET imaging and discussed treatment options.  The plan is to refer him for biopsy of the left retroperitoneal lymph node in order to obtain tissue to confirm stage IV disease and performed additional testing.  This testing will include PD-L1 and HER-2 stains.  We will make a decision on proceeding with systemic therapy versus palliative radiation depending on these results.  He agrees to proceed with the biopsy procedure.  We informed his wife.  Outpatient follow-up will be scheduled at the Cancer center next week.  I appreciate the care from Dr. Rodena Piety.  LOS: 5 days   Betsy Coder, MD   07/10/2019, 12:05 PM

## 2019-07-10 NOTE — Progress Notes (Signed)
Physical Therapy Treatment Patient Details Name: David [name] MRN: [id number] DOB: [birth year] Today's Date: 07/10/2019    History of Present Illness 74 y.o. male with medical history significant for esophageal cancer diagnosed 04/2019, dementia, COPD, CKD stage III, OSA intolerable of CPAP, hypertension and depression/anxiety who presents as a direct admission from outpatient oncology office for concerns of worsening dysphagia.  Pt s/p perc G tube 3/29    PT Comments    Pt ambulated in hallway without assistive device and tolerated good distance.  Pt even able to remove his watch from his wrist and attempt to reset while ambulating.  Pt reports overall feeling tired today and requested back to bed upon returning to room.   Follow Up Recommendations  No PT follow up;Supervision - Intermittent     Equipment Recommendations  None recommended by PT    Recommendations for Other Services       Precautions / Restrictions Precautions Precaution Comments: G tube Restrictions Weight Bearing Restrictions: No    Mobility  Bed Mobility Overal bed mobility: Modified Independent                Transfers Overall transfer level: Modified independent Equipment used: None                Ambulation/Gait Ambulation/Gait assistance: Supervision Gait Distance (Feet): 300 Feet Assistive device: None Gait Pattern/deviations: Step-through pattern;Decreased stride length     General Gait Details: pt ambulated without any UE support today and steady with no LOB observed, pt even able to remove his watch from wrist and attempt to reset (however battery dead) while moving   Stairs             Wheelchair Mobility    Modified Rankin (Stroke Patients Only)       Balance                                            Cognition Arousal/Alertness: Awake/alert Behavior During Therapy: WFL for tasks assessed/performed Overall Cognitive Status: No  family/caregiver present to determine baseline cognitive functioning                                 General Comments: hx dementia, memory loss      Exercises      General Comments        Pertinent Vitals/Pain Pain Assessment: No/denies pain Faces Pain Scale: Hurts a little bit Pain Location: general discomfort at PEG site  Pain Descriptors / Indicators: Discomfort;Grimacing Pain Intervention(s): Repositioned;Monitored during session    Home Living                      Prior Function            PT Goals (current goals can now be found in the care plan section) Acute Rehab PT Goals Patient Stated Goal: return home, to get some rest Progress towards PT goals: Progressing toward goals    Frequency    Min 3X/week      PT Plan Current plan remains appropriate    Co-evaluation              AM-PAC PT "6 Clicks" Mobility   Outcome Measure  Help needed turning from your back to your side while in a flat bed without using bedrails?: None  Help needed moving from lying on your back to sitting on the side of a flat bed without using bedrails?: None Help needed moving to and from a bed to a chair (including a wheelchair)?: None Help needed standing up from a chair using your arms (e.g., wheelchair or bedside chair)?: None Help needed to walk in hospital room?: A Little Help needed climbing 3-5 steps with a railing? : A Little 6 Click Score: 22    End of Session   Activity Tolerance: Patient tolerated treatment well Patient left: in bed;with call bell/phone within reach;with bed alarm set   PT Visit Diagnosis: Difficulty in walking, not elsewhere classified (R26.2)     Time: 7017-7939 PT Time Calculation (min) (ACUTE ONLY): 12 min  Charges:  $Gait Training: 8-22 mins                    Arlyce Dice, DPT Acute Rehabilitation Services Office: [phone]  Trena Platt 07/10/2019, 2:55 PM

## 2019-07-10 NOTE — Progress Notes (Signed)
[facility] [employer] representative received call from patients caregiver, [name].  [name] is requesting any resources or assistance available once patient discharges from the hospital.  [name] is currently working, and does not feel she can provide the amount of care patient will require at home.  CSW and [name] dicussed home care vs home health. [name] is very familiar with home health and services typically provided.  [name] does not feel that patient can financially support the cost of home care.  CSW provided education on [employer] navigator program, and possible assistance through the [employer].  CSW received consent from patient to submit a referral through [employer] to [employer].  CSW completed referral and listed Leslies contact information.  [facility] will contact [name]/patient once referral is received.  Johnnye Lana, MSW, LCSW, OSW-C Clinical Social Worker [facility] [phone]

## 2019-07-11 ENCOUNTER — Ambulatory Visit: Payer: Medicare Other | Admitting: Psychiatry

## 2019-07-11 ENCOUNTER — Telehealth: Payer: Self-pay

## 2019-07-11 DIAGNOSIS — F411 Generalized anxiety disorder: Secondary | ICD-10-CM

## 2019-07-11 DIAGNOSIS — R1310 Dysphagia, unspecified: Secondary | ICD-10-CM

## 2019-07-11 DIAGNOSIS — Z7189 Other specified counseling: Secondary | ICD-10-CM

## 2019-07-11 DIAGNOSIS — F0390 Unspecified dementia without behavioral disturbance: Secondary | ICD-10-CM

## 2019-07-11 DIAGNOSIS — R531 Weakness: Secondary | ICD-10-CM

## 2019-07-11 DIAGNOSIS — Z515 Encounter for palliative care: Secondary | ICD-10-CM

## 2019-07-11 LAB — BASIC METABOLIC PANEL
Anion gap: 8 (ref 5–15)
BUN: 11 mg/dL (ref 8–23)
CO2: 24 mmol/L (ref 22–32)
Calcium: 9.8 mg/dL (ref 8.9–10.3)
Chloride: 105 mmol/L (ref 98–111)
Creatinine, Ser: 1 mg/dL (ref 0.61–1.24)
GFR calc Af Amer: >60 mL/min (ref >60)
GFR calc non Af Amer: >60 mL/min (ref >60)
Glucose, Bld: 143 mg/dL — ABNORMAL HIGH (ref 70–99)
Potassium: 3.8 mmol/L (ref 3.5–5.1)
Sodium: 137 mmol/L (ref 135–145)

## 2019-07-11 LAB — MAGNESIUM: Magnesium: 2.1 mg/dL (ref 1.7–2.4)

## 2019-07-11 LAB — PHOSPHORUS: Phosphorus: 2.9 mg/dL (ref 2.5–4.6)

## 2019-07-11 MED ORDER — OSMOLITE 1.5 CAL PO LIQD
1000.0000 mL | ORAL | Status: DC
  Administered 2019-07-11: 1000 mL
  Filled 2019-07-11 (×3): qty 1000

## 2019-07-11 NOTE — Consult Note (Signed)
Consultation Note Date: 07/11/2019   Patient Name: David [name]  DOB: 23-Nov-1945  MRN: [id number]  Age / Sex: 74 y.o., male  PCP: Leighton Ruff, MD Referring Physician: Georgette Shell, MD  Reason for Consultation: Establishing goals of care  HPI/Patient Profile: 74 y.o. male   admitted on 07/05/2019      Clinical Assessment and Goals of Care: 74 year old gentleman who lives alone in [place], [place].  Wife [name] present at the bedside.  Patient has past medical history significant for esophageal cancer diagnosed in January 2021.  Has underlying past medical history of dementia, COPD and stage III chronic kidney disease and obstructive sleep apnea hypertension and anxiety depression.  Patient was direct admitted from outpatient oncology with concerns of worsening dysphagia.  He has been admitted to the hospital medicine service.  He has been followed by medical oncology.  He underwent PEG tube placement in this hospitalization.  He is tolerating tube feedings fine.  A palliative consultation has been requested for additional support.  Patient is awake alert resting in bed.  Chart has been reviewed.  Also discussed with wife present at the bedside.  Introduced myself and palliative care as follows: Palliative medicine is specialized medical care for people living with serious illness. It focuses on providing relief from the symptoms and stress of a serious illness. The goal is to improve quality of life for both the patient and the family.  Goals of care: Broad aims of medical therapy in relation to the patient's values and preferences. Our aim is to provide medical care aimed at enabling patients to achieve the goals that matter most to them, given the circumstances of their particular medical situation and their constraints.   Reviewed and recapped management plan from a cancer  perspective.  Discussed about next steps and broad goals of care.  CODE STATUS discussions undertaken.  Difference between hospice and palliative explained.  Offered active listening emotional support and compassionate presence.  Acknowledged the severity of the situation as the patient gravels with serious life limiting disease.  Offered supportive presence.  All of their concerns addressed to the best of my ability.  NEXT OF KIN  wife [name] He lives in [place], [place].   SUMMARY OF RECOMMENDATIONS    full code full scope of care.  Home with palliative on discharge is recommended.  Continue current mode of care for now.  Thank you for the consult.   Code Status/Advance Care Planning:  Full code    Symptom Management:    as above.   Palliative Prophylaxis:   Delirium Protocol  Additional Recommendations (Limitations, Scope, Preferences):  Full Scope Treatment  Psycho-social/Spiritual:   Desire for further Chaplaincy support:yes  Additional Recommendations: Caregiving  Support/Resources  Prognosis:   Unable to determine  Discharge Planning: Home with Palliative Services      Primary Diagnoses: Present on Admission: . Major depressive disorder, recurrent episode, severe ([place]) . Generalized anxiety disorder   I have reviewed the medical record, interviewed the patient and family, and  examined the patient. The following aspects are pertinent.  Past Medical History:  Diagnosis Date  . Anxiety   . BPH (benign prostatic hyperplasia)   . Dementia ([place])   . Depression   . Hyperlipidemia   . Hypertension   . Kidney stone   . Memory loss   . 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 Sleep apnea    CPAP  . Vitamin D deficiency    Social History   Socioeconomic History  . Marital status: Married    Spouse name: [name]  . Number of children: 2  . Years of education: 41  . 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 Smoking status: Never Smoker  . Smokeless tobacco: Never Used  Substance and Sexual Activity  . Alcohol use: Yes    Alcohol/week: 1.0 - 2.0 standard drinks    Types: 1 - 2 Shots of liquor per week    Comment: 1-2 shots 4-5 x week  . Drug use: Yes    Types: Marijuana    Comment: Occasional  . Sexual activity: Never  Other Topics Concern  . Not on file  Social History Narrative   Lives alone retired.  Education: BS, MS Phd chemist.  Separated 8 yrs a/o spring 2020.  Adult children son, out of home, and daughter, adult with autistic spectrum, lives with estranged wife.  2 gks.  Mother died at age 5, of COVID, in the [place] outbreak -- emotionally destabilizing event late May/early June.        Cokes 0-3 daily as reported by nursing Sept 2019.  Gets medications via blister pack prepared by pharmacy a/o spring 2020, with generally good compliance but some mixing up of times and dates.   Social Determinants of Health   Financial Resource Strain:   . Difficulty of Paying Living Expenses:   Food Insecurity:   . Worried About Charity fundraiser in the Last Year:   . Arboriculturist in the Last Year:   Transportation Needs:   . Film/video editor (Medical):   Marland Kitchen Lack of Transportation (Non-Medical):   Physical Activity:   . Days of Exercise per Week:   . Minutes of Exercise per Session:   Stress:   . Feeling of Stress :   Social Connections:   . Frequency of Communication with Friends and Family:   . Frequency of Social Gatherings with Friends and Family:   . Attends Religious Services:   . Active Member of Clubs or Organizations:   . Attends Archivist Meetings:   Marland Kitchen Marital Status:    Family History  Problem Relation Age of Onset  . Alzheimer's disease Mother   . Depression Mother   . Cancer Father        prostate  . Thyroid disease Father   . Alzheimer's disease Maternal Grandmother   . Heart disease Maternal Grandfather     . Stroke Paternal Grandmother   . Stroke Paternal Grandfather   . Alcoholism Brother   . Autism Daughter   . Autism Maternal Uncle    Scheduled Meds: . buPROPion  150 mg Oral Daily  . cholecalciferol  2,000 Units Oral Daily  . donepezil  5 mg Oral QHS  . escitalopram  20 mg Oral Daily  . feeding supplement (PRO-STAT SUGAR FREE 64)  30 mL Oral BID  . free water  100 mL Per Tube Q4H  .  memantine  10 mg Oral BID  . OLANZapine  5 mg Oral QHS  . simvastatin  40 mg Oral QPM  . tamsulosin  0.4 mg Oral Daily  . vitamin B-12  1,000 mcg Oral Daily   Continuous Infusions: . sodium chloride 75 mL/hr at 07/11/19 0726  . feeding supplement (OSMOLITE 1.5 CAL) 1,000 mL (07/11/19 1035)   PRN Meds:.HYDROcodone-acetaminophen, LORazepam, ondansetron (ZOFRAN) IV Medications Prior to Admission:  Prior to Admission medications   Medication Sig Start Date End Date Taking? Authorizing Provider  buPROPion (WELLBUTRIN XL) 150 MG 24 hr tablet TAKE 1 TABLET ONCE DAILY. 06/28/19  Yes [name], PMHNP  Cholecalciferol (VITAMIN D3 PO) Take 2,000 Units by mouth daily.   Yes [provider]  donepezil (ARICEPT) 5 MG tablet TAKE ONE TABLET AT BEDTIME. 06/28/19  Yes [name], PMHNP  escitalopram (LEXAPRO) 20 MG tablet TAKE 1 TABLET ONCE DAILY. 06/28/19  Yes [name], PMHNP  LORazepam (ATIVAN) 0.5 MG tablet TAKE ONE TABLET AT BEDTIME. Patient taking differently: Take 0.5 mg by mouth every 6 (six) hours as needed for anxiety.  06/28/19  Yes [name], PMHNP  memantine (NAMENDA) 10 MG tablet TAKE 1 TABLET BY MOUTH TWICE DAILY. 06/28/19  Yes [name], PMHNP  OLANZapine (ZYPREXA) 5 MG tablet TAKE ONE TABLET AT BEDTIME. 06/28/19  Yes [name], PMHNP  simvastatin (ZOCOR) 40 MG tablet Take 40 mg by mouth every evening.   Yes [provider]  Tamsulosin HCl (FLOMAX) 0.4 MG CAPS Take 0.4 mg by mouth daily.    Yes [provider]  vitamin B-12 (CYANOCOBALAMIN)  1000 MCG tablet Take 1,000 mcg by mouth daily.   Yes [provider]  sucralfate (CARAFATE) 1 g tablet Take 1 tablet (1 g total) by mouth 4 (four) times daily -  with meals and at bedtime. 5 min before meals for radiation induced esophagitis 07/04/19   Hayden Pedro, PA-C   Allergies  Allergen Reactions  . Other     Other reaction(s): Other Sleep walk   . Abilify [Aripiprazole] Other (See Comments)    Short term memory loss  . Lamictal [Lamotrigine]     Increased agitation  . Wellbutrin [Bupropion] Anxiety    Patient does not recall allergy type Anxiety, confusion per Dr Edwin Dada note   Review of Systems Admits to feeling overwhelmed because of current circumstances. Physical Exam Awake alert resting in bed Appears well-developed well-nourished Appears to have regular work of breathing Abdomen is not distended nontender Patient has PEG tube with tube feedings ongoing Does not have edema Awake alert oriented nonfocal Somewhat of a flat affect  Vital Signs: BP 136/89 (BP Location: Right Arm)   Pulse 85   Temp 98.4 F (36.9 C) (Oral)   Resp 16   Ht 6' 2.02" (1.88 m)   Wt 100.7 kg   SpO2 98%   BMI 28.49 kg/m  Pain Scale: 0-10   Pain Score: 0-No pain   SpO2: SpO2: 98 % O2 Device:SpO2: 98 % O2 Flow Rate: .O2 Flow Rate (L/min): 2 L/min  IO: Intake/output summary:   Intake/Output Summary (Last 24 hours) at 07/11/2019 1437 Last data filed at 07/11/2019 0057 Gross per 24 hour  Intake 2779.33 ml  Output --  Net 2779.33 ml    LBM: Last BM Date: 07/09/19 Baseline Weight: Weight: 100.7 kg Most recent weight: Weight: 100.7 kg     Palliative Assessment/Data:   Palliative performance scale 40%  Time In: 1330 Time Out: 1430 Time Total:  60 Greater than 50%  of this time was spent counseling and coordinating care related to the above assessment and plan.  Signed by: Loistine Chance, MD   Please contact Palliative Medicine Team phone at [phone] for  questions and concerns.  For individual provider: See Shea Evans

## 2019-07-11 NOTE — Progress Notes (Signed)
PROGRESS NOTE    David [name]  GEX:[id number] DOB: 1945/06/12 DOA: 07/05/2019 PCP: Leighton Ruff, MD   Brief Narrative: 74 y.o.malewith medical history significant foresophageal cancer diagnosed 04/2019, dementia, COPD, CKD stage III, OSA intolerable of CPAP, hypertension and depression/anxiety who presents as a direct admission from outpatient oncology office for concerns of worsening dysphagia.  Patient presented to his oncologist today for discussion of treatment plan but was noted to have worsening dysphagiawith concerns of being able to maintain good nutritional statusand plan were made for him to be admitted for arrangement of a feeding tube and IV fluid hydration. Patient reports that he can only tolerate food after he has masticated liquid and even then he might sometimes vomit due to nausea. Currently, he denies any pain. No chest pain, shortness of breath. No nausea, vomiting or diarrhea.  He reports marijuana use in the past. Denies any alcohol or tobacco use.  Assessment & Plan:   Principal Problem:   Dysphagia Active Problems:   Major depressive disorder, recurrent episode, severe ([facility])   Generalized anxiety disorder   Dementia without behavioral disturbance ([facility])   AKI (acute kidney injury) ([place])   Esophageal cancer ([place])   #1  Advanced esophageal cancer-patient admitted with a dysphagia.  He is status post PEG tube placement 07/08/2019.  Tube feeds started 07/09/2019.  Home tomorrow on bolus feedings 1 cartoon Osmolite 1.55 times a day with 30 mL prostat twice daily and 100 mL free water with each tube feeding bolus with 50 mL before and 50 mL after each bolus. Closely monitoring for refeeding syndrome.  Potassium magnesium phosphorus normal today.  Recheck levels in a.m.  Discussed with Dr. Benay Spice.  Plan to get a lymph node biopsy however IR discussed with Dr. Benay Spice and reported that it is too risky to do a retroperitoneal lymph node biopsy due to  surrounding blood vessels. Will discuss with GI to see if endoscopic ultrasound can be done to get a paratracheal lymph node biopsy.  [facility] consulted.   CT scan May 06, 2019 shows circumferential thickening of the distal esophagus with mild splenomegaly bladder wall thickening and enlarged prostate.  Upper endoscopy 06/11/2019 shows ulcerated mass of the lower esophagus partially obstructing biopsy confirmed as poorly differentiated malignantneoplasm. Repeat endoscopy 06/21/2019 shows partially obstructing esophageal tumor at the lower third of the esophagus.  PET scan 07/05/2019 shows large esophageal mass with nodal involvement both above and below the diaphragm  Covid negative  #2 AKI resolved with IV fluids creatinine 0.93 down from 1.32.  #3 history of dementia on Aricept Namenda and Zyprexa  #4 history of depression and anxiety on Lexapro and Wellbutrin and as needed Ativan  #5 obstructive sleep apnea patient is intolerant to CPAP  #6 benign prostatic hypertrophy on Flomax  #7 hyperlipidemiaon zocor  Nutrition Problem: Increased nutrient needs Etiology: cancer and cancer related treatments, catabolic illness     Signs/Symptoms: estimated needs    Interventions: Refer to RD note for recommendations  Estimated body mass index is 28.49 kg/m as calculated from the following:   Height as of this encounter: 6' 2.02" (1.88 m).   Weight as of this encounter: 100.7 kg.  DVT prophylaxis:SCD Code Status:Full code Family Communication:Discussed with his wife.   Disposition Plan:Patient came from home Plan is to discharge him home Barriers to discharge--patient being started on tube feeds for the first time need to monitor for refeeding syndrome.  Patient lives alone and has no safe discharge planning at this time.  Consultants:  Interventional radiology  Procedures:None Antimicrobials:None   Subjective: Patient seen by palliative care  patient will follow up with outpatient palliative care upon discharge and continues full code  Objective: Vitals:   07/10/19 1431 07/10/19 2026 07/11/19 0525 07/11/19 1412  BP: (!) 144/87 (!) 142/94 138/77 136/89  Pulse: 79 81 73 85  Resp: 20 19 18 16   Temp: 98.8 F (37.1 C) 98.7 F (37.1 C) 98.7 F (37.1 C) 98.4 F (36.9 C)  TempSrc: Oral Oral Oral Oral  SpO2: 97% 98% 95% 98%  Weight:      Height:        Intake/Output Summary (Last 24 hours) at 07/11/2019 1424 Last data filed at 07/11/2019 0057 Gross per 24 hour  Intake 2779.33 ml  Output --  Net 2779.33 ml   Filed Weights   07/08/19 0851  Weight: 100.7 kg    Examination:  General exam: Appears calm and comfortable  Respiratory system: Clear to auscultation. Respiratory effort normal. Cardiovascular system: S1 & S2 heard, RRR. No JVD, murmurs, rubs, gallops or clicks. No pedal edema. Gastrointestinal system: Abdomen is nondistended, soft and nontender. No organomegaly or masses felt. Normal bowel sounds heard. Central nervous system: Alert and oriented. No focal neurological deficits. Extremities: Symmetric 5 x 5 power. Skin: No rashes, lesions or ulcers Psychiatry: Judgement and insight appear normal. Mood & affect appropriate.     Data Reviewed: I have personally reviewed following labs and imaging studies  CBC: Recent Labs  Lab 07/05/19 2037 07/06/19 0615 07/08/19 0525 07/10/19 0526  WBC 9.8 8.2 8.4 7.9  HGB 13.2 13.0 12.6* 12.7*  HCT 40.6 40.3 38.7* 39.0  MCV 92.3 92.2 92.8 90.7  PLT 285 252 243 876   Basic Metabolic Panel: Recent Labs  Lab 07/05/19 2037 07/06/19 0615 07/08/19 0525 07/10/19 0526 07/11/19 0520  NA 139 141 138 137 137  K 3.8 3.8 4.1 3.7 3.8  CL 103 107 103 104 105  CO2 25 24 27 27 24   GLUCOSE 104* 103* 101* 133* 143*  BUN 16 14 7* 10 11  CREATININE 1.27* 1.22 1.32* 0.93 1.00  CALCIUM 10.1 9.9 9.6 9.8 9.8  MG  --   --   --  2.2 2.1  PHOS  --   --   --  2.7 2.9    GFR: Estimated Creatinine Clearance: 84.6 mL/min (by C-G formula based on SCr of 1 mg/dL). Liver Function Tests: Recent Labs  Lab 07/05/19 2037  AST 16  ALT 15  ALKPHOS 54  BILITOT 1.1  PROT 6.8  ALBUMIN 3.8   No results for input(s): LIPASE, AMYLASE in the last 168 hours. No results for input(s): AMMONIA in the last 168 hours. Coagulation Profile: Recent Labs  Lab 07/05/19 2037  INR 1.0   Cardiac Enzymes: No results for input(s): CKTOTAL, CKMB, CKMBINDEX, TROPONINI in the last 168 hours. BNP (last 3 results) No results for input(s): PROBNP in the last 8760 hours. HbA1C: No results for input(s): HGBA1C in the last 72 hours. CBG: Recent Labs  Lab 07/05/19 0947  GLUCAP 118*   Lipid Profile: No results for input(s): CHOL, HDL, LDLCALC, TRIG, CHOLHDL, LDLDIRECT in the last 72 hours. Thyroid Function Tests: No results for input(s): TSH, T4TOTAL, FREET4, T3FREE, THYROIDAB in the last 72 hours. Anemia Panel: No results for input(s): VITAMINB12, FOLATE, FERRITIN, TIBC, IRON, RETICCTPCT in the last 72 hours. Sepsis Labs: No results for input(s): PROCALCITON, LATICACIDVEN in the last 168 hours.  Recent Results (from the past 240 hour(s))  SARS CORONAVIRUS 2 (TAT 6-24 HRS) Nasopharyngeal Nasopharyngeal Swab     Status: None   Collection Time: 07/05/19  8:28 PM   Specimen: Nasopharyngeal Swab  Result Value Ref Range Status   SARS Coronavirus 2 NEGATIVE NEGATIVE Final    Comment: (NOTE) SARS-CoV-2 target nucleic acids are NOT DETECTED. The SARS-CoV-2 RNA is generally detectable in upper and lower respiratory specimens during the acute phase of infection. Negative results do not preclude SARS-CoV-2 infection, do not rule out co-infections with other pathogens, and should not be used as the sole basis for treatment or other patient management decisions. Negative results must be combined with clinical observations, patient history, and epidemiological information. The  expected result is Negative. Fact Sheet for Patients: SugarRoll.be Fact Sheet for Healthcare Providers: https://www.[employer].com/ This test is not yet approved or cleared by the [place] FDA and  has been authorized for detection and/or diagnosis of SARS-CoV-2 by FDA under an Emergency Use Authorization (EUA). This EUA will remain  in effect (meaning this test can be used) for the duration of the COVID-19 declaration under Section 56 4(b)(1) of the Act, 21 U.S.C. section 360bbb-3(b)(1), unless the authorization is terminated or revoked sooner. Performed at [facility], [place] [address] [place], [place] [address]          Radiology Studies: No results found.      Scheduled Meds: . buPROPion  150 mg Oral Daily  . cholecalciferol  2,000 Units Oral Daily  . donepezil  5 mg Oral QHS  . escitalopram  20 mg Oral Daily  . feeding supplement (PRO-STAT SUGAR FREE 64)  30 mL Oral BID  . free water  100 mL Per Tube Q4H  . memantine  10 mg Oral BID  . OLANZapine  5 mg Oral QHS  . simvastatin  40 mg Oral QPM  . tamsulosin  0.4 mg Oral Daily  . vitamin B-12  1,000 mcg Oral Daily   Continuous Infusions: . sodium chloride 75 mL/hr at 07/11/19 0726  . feeding supplement (OSMOLITE 1.5 CAL) 1,000 mL (07/11/19 1035)     LOS: 6 days     Georgette Shell, MD 07/11/2019, 2:24 PM

## 2019-07-11 NOTE — Progress Notes (Signed)
IP PROGRESS NOTE  Subjective:   David [name] appears unchanged  No new complaint.  Objective: Vital signs in last 24 hours: Blood pressure 136/89, pulse 85, temperature 98.4 F (36.9 C), temperature source Oral, resp. rate 16, height 6' 2.02" (1.88 m), weight 222 lb 0.1 oz (100.7 kg), SpO2 98 %.  Intake/Output from previous day: 03/31 0701 - 04/01 0700 In: 3015.3 [P.O.:708; I.V.:1407.8; NG/GT:899.5] Out: -   Physical Exam:    Abdomen soft, upper abdomen feeding tube site with a gauze dressing   Lab Results: Recent Labs    07/10/19 0526  WBC 7.9  HGB 12.7*  HCT 39.0  PLT 243    BMET Recent Labs    07/10/19 0526 07/11/19 0520  NA 137 137  K 3.7 3.8  CL 104 105  CO2 27 24  GLUCOSE 133* 143*  BUN 10 11  CREATININE 0.93 1.00  CALCIUM 9.8 9.8    No results found for: CEA1  Studies/Results: No results found.  Medications: I have reviewed the patient's current medications.  Assessment/Plan: 1. Esophagus cancer ? CT 05/06/2019-circumferential thickening of the distal esophagus, mild splenomegaly, bladder wall thickening, enlarged prostate ? Upper endoscopy 06/11/2019-ulcerated mass at the lower esophagus, 36 cm from the incisors, partially obstructing, biopsy revealed atypical cells-reactive change versus poorly differentiated neoplasm, outside consultation at [facility] on this biopsy and a repeat biopsy 06/21/2019 confirmed a poorly differentiated malignant neoplasm ? Repeat endoscopy 06/21/2019-partially obstructing esophageal tumor found in the lower third of the esophagus ? PET scan 07/05/2019-large esophageal mass with nodal involvement both above and below the diaphragm including a left periaortic node 2. Dementia 3. Anxiety/depression 4. Sleep apnea 5. BPH 6. Dysphagia secondary to #1  Gastrostomy tube placement 07/08/2019 7. Hyperlipidemia  David [name] appears stable.  He is tolerating the tube feedings well.  I discussed the case with interventional  radiology yesterday.  They do not recommend biopsy of the left retroperitoneal node due to proximity to an artery and expected low yield.  We have requested PD-L1 and HER-2 testing on the esophagus biopsy tissue.  I discussed the case with Ms. Gan today.  She understands he appears to have an incurable malignancy.  Treatment options include comfort/supportive care, immunotherapy if the PD-L1 expression returns high, and chemotherapy/radiation.  She understands the difficult decision process in the setting of dementia.  He will need a large amount of caregiver time going forward.  He can be discharged to home with his wife when Dr. Rodena Piety feels this is appropriate.  We will schedule outpatient follow-up at the Cancer center.  We will decide on the treatment plan when the PD-L1 testing is available.     LOS: 6 days   Betsy Coder, MD   07/11/2019, 6:01 PM

## 2019-07-11 NOTE — Telephone Encounter (Signed)
Spoke with Dr. Donato Heinz regarding status of PD-1 testing results, he states was sent out on Monday so the results will most likely be back on Friday 4/2 or Monday 4/5 they will be faxed to Dr. Benay Spice.  I asked him to add HER2 testing to this as well and he states he will.  Dr. Benay Spice was made aware.

## 2019-07-11 NOTE — Care Management Important Message (Signed)
Important Message  Patient Details IM Letter given to Roque Lias SW Case Manager to present to the Patient Name: David [name] MRN: [id number] Date of Birth: 11-17-1945   Medicare Important Message Given:  Yes     Kerin Salen 07/11/2019, 10:49 AM

## 2019-07-11 NOTE — Progress Notes (Signed)
Spoke with patient's wife [name] regarding follow up on whether retroperitoneal biopsy will be done.  Per Dr. Benay Spice after IR did further evaluation they feel it is not safe to perform the biopsy.  I explained to her that I have spoken to the pathologist regarding the biopsy sample that was previously done and asked for PD-1 and HER2 testing be done.  I explained to her that these results will probably not be back until beginning of next week.  She verbalized an understanding.  She is struggling with the issue of him coming home.  Our CSW Edwyna Shell has been in touch with her about a Memory Care program that costs $75 a day where he could be while she is working.  She is considering this option.

## 2019-07-12 LAB — MAGNESIUM: Magnesium: 2.1 mg/dL (ref 1.7–2.4)

## 2019-07-12 LAB — PHOSPHORUS: Phosphorus: 2.9 mg/dL (ref 2.5–4.6)

## 2019-07-12 LAB — BASIC METABOLIC PANEL
Anion gap: 8 (ref 5–15)
BUN: 12 mg/dL (ref 8–23)
CO2: 26 mmol/L (ref 22–32)
Calcium: 10 mg/dL (ref 8.9–10.3)
Chloride: 104 mmol/L (ref 98–111)
Creatinine, Ser: 0.97 mg/dL (ref 0.61–1.24)
GFR calc Af Amer: >60 mL/min (ref >60)
GFR calc non Af Amer: >60 mL/min (ref >60)
Glucose, Bld: 126 mg/dL — ABNORMAL HIGH (ref 70–99)
Potassium: 4 mmol/L (ref 3.5–5.1)
Sodium: 138 mmol/L (ref 135–145)

## 2019-07-12 MED ORDER — HYDROCODONE-ACETAMINOPHEN 5-325 MG PO TABS: 1.0000 | ORAL_TABLET | ORAL | 0 refills | Status: DC | PRN

## 2019-07-12 MED ORDER — OLANZAPINE 5 MG PO TABS: 5.0000 mg | ORAL_TABLET | Freq: Every day | ORAL | 0 refills | Status: DC

## 2019-07-12 MED ORDER — LORAZEPAM 0.5 MG PO TABS: 0.5000 mg | ORAL_TABLET | Freq: Four times a day (QID) | ORAL | 0 refills | Status: DC | PRN

## 2019-07-12 MED ORDER — PRO-STAT SUGAR FREE PO LIQD: 30.0000 mL | Freq: Two times a day (BID) | ORAL | 0 refills | Status: DC

## 2019-07-12 MED ORDER — OSMOLITE 1.5 CAL PO LIQD: ORAL | 12 refills | Status: DC

## 2019-07-12 NOTE — Plan of Care (Signed)

## 2019-07-12 NOTE — Discharge Summary (Signed)
Physician Discharge Summary  David [name] ZOX:[id number] DOB: [birth year] DOA: 07/05/2019  PCP: Leighton Ruff, MD  Admit date: 07/05/2019 Discharge date: 07/12/2019  Admitted From: home Disposition: home  Recommendations for Outpatient Follow-up:  1. Follow up with PCP in 1-2 weeks 2. Please obtain BMP/CBC in one week 3. Please follow up on the   pending results 4. Follow-up with Dr. Benay Spice  Home Health: PT and RN and aide equipment/Devices: None  Discharge Condition stable) CODE STATUS: Full code Diet recommendation: Tube feeds  Brief/Interim Summary:73 y.o.malewith medical history significant foresophageal cancer diagnosed 04/2019, dementia, COPD, CKD stage III, OSA intolerable of CPAP, hypertension and depression/anxiety who presents as a direct admission from outpatient oncology office for concerns of worsening dysphagia.  Patient presented to his oncologist today for discussion of treatment plan but was noted to have worsening dysphagiawith concerns of being able to maintain good nutritional statusand plan were made for him to be admitted for arrangement of a feeding tube and IV fluid hydration. Patient reports that he can only tolerate food after he has masticated liquid and even then he might sometimes vomit due to nausea. Currently, he denies any pain. No chest pain, shortness of breath. No nausea, vomiting or diarrhea.  He reports marijuana use in the past. Denies any alcohol or tobacco use.  Discharge Diagnoses:  Principal Problem:   Dysphagia Active Problems:   Major depressive disorder, recurrent episode, severe (HCC)   Generalized anxiety disorder   Dementia without behavioral disturbance ([place])   AKI (acute kidney injury) ([place])   Esophageal cancer ([place])     #1Advanced esophageal cancer-patient admitted with  dysphagia. He is status post PEG tube placement 07/08/2019.  Tube feeds started 07/09/2019.  Home today on bolus feedings 1 cartoon Osmolite  1.5--5 times a day with 30 mL prostat twice daily and 100 mL free water with each tube feeding bolus with 50 mL before and 50 mL after each bolus. Potassium magnesium phosphorus normal today.   CT scan May 06, 2019 shows circumferential thickening of the distal esophagus with mild splenomegaly bladder wall thickening and enlarged prostate.  Upper endoscopy 06/11/2019 shows ulcerated mass of the lower esophagus partially obstructing biopsy confirmed as poorly differentiated malignantneoplasm. Repeat endoscopy 06/21/2019 shows partially obstructing esophageal tumor at the lower third of the esophagus.  PET scan 07/05/2019 shows large esophageal mass with nodal involvement both above and below the diaphragm  Covid negative  #2 AKIresolved with IV fluids creatinine 0.93   #3 history of dementia on Aricept Namenda and Zyprexa  #4 history of depression and anxiety on Lexapro and Wellbutrin and as needed Ativan  #5 obstructive sleep apnea patient is intolerant to CPAP  #6 benign prostatic hypertrophy on Flomax  #7 hyperlipidemiaon zocor  Nutrition Problem: Increased nutrient needs Etiology: cancer and cancer related treatments, catabolic illness    Signs/Symptoms: estimated needs     Interventions: Refer to RD note for recommendations  Estimated body mass index is 28.49 kg/m as calculated from the following:   Height as of this encounter: 6' 2.02" (1.88 m).   Weight as of this encounter: 100.7 kg.  Discharge Instructions   Allergies as of 07/12/2019      Reactions   Other    Other reaction(s): Other Sleep walk    Abilify [aripiprazole] Other (See Comments)   Short term memory loss   Lamictal [lamotrigine]    Increased agitation   Wellbutrin [bupropion] Anxiety   Patient does not recall allergy type Anxiety, confusion per Dr  E Barnes note      Medication List    STOP taking these medications   simvastatin 40 MG tablet Commonly known as: ZOCOR    sucralfate 1 g tablet Commonly known as: Carafate     TAKE these medications   buPROPion 150 MG 24 hr tablet Commonly known as: WELLBUTRIN XL TAKE 1 TABLET ONCE DAILY.   donepezil 5 MG tablet Commonly known as: ARICEPT TAKE ONE TABLET AT BEDTIME.   escitalopram 20 MG tablet Commonly known as: LEXAPRO TAKE 1 TABLET ONCE DAILY.   feeding supplement (PRO-STAT SUGAR FREE 64) Liqd Take 30 mLs by mouth 2 (two) times daily.   HYDROcodone-acetaminophen 5-325 MG tablet Commonly known as: NORCO/VICODIN Take 1-2 tablets by mouth every 4 (four) hours as needed for moderate pain.   LORazepam 0.5 MG tablet Commonly known as: ATIVAN Take 1 tablet (0.5 mg total) by mouth every 6 (six) hours as needed for anxiety.   memantine 10 MG tablet Commonly known as: NAMENDA TAKE 1 TABLET BY MOUTH TWICE DAILY.   OLANZapine 5 MG tablet Commonly known as: ZYPREXA Take 1 tablet (5 mg total) by mouth at bedtime.   tamsulosin 0.4 MG Caps capsule Commonly known as: FLOMAX Take 0.4 mg by mouth daily.   vitamin B-12 1000 MCG tablet Commonly known as: CYANOCOBALAMIN Take 1,000 mcg by mouth daily.   VITAMIN D3 PO Take 2,000 Units by mouth daily.      Follow-up Information    Leighton Ruff, MD Follow up.   Specialty: Family Medicine Contact information: [place] [address] [phone]        Ladell Pier, MD Follow up.   Specialty: Oncology Contact information: [place] [address] [phone]          Allergies  Allergen Reactions  . Other     Other reaction(s): Other Sleep walk   . Abilify [Aripiprazole] Other (See Comments)    Short term memory loss  . Lamictal [Lamotrigine]     Increased agitation  . Wellbutrin [Bupropion] Anxiety    Patient does not recall allergy type Anxiety, confusion per Dr Edwin Dada note    Consultations: Dr Benay Spice  Procedures/Studies: IR GASTROSTOMY TUBE MOD SED  Result Date:  07/08/2019 INDICATION: 74 year old male with obstructing esophageal mass and resultant dysphagia. EXAM: Fluoroscopically guided placement of percutaneous balloon retention gastrostomy tube Interventional Radiologist:  Criselda Peaches, MD MEDICATIONS: 2 g Ancef; Antibiotics were administered within 1 hour of the procedure. ANESTHESIA/SEDATION: Versed 2 mg IV; Fentanyl 100 mcg IV Moderate Sedation Time:  16 minutes The patient was continuously monitored during the procedure by the interventional radiology nurse under my direct supervision. CONTRAST:  57mL OMNIPAQUE IOHEXOL 300 MG/ML  SOLN FLUOROSCOPY TIME:  Fluoroscopy Time: 2 minutes 36 seconds (50 mGy). COMPLICATIONS: None immediate. PROCEDURE: Informed written consent was obtained from the patient after a thorough discussion of the procedural risks, benefits and alternatives. All questions were addressed. Maximal Sterile Barrier Technique was utilized including caps, mask, sterile gowns, sterile gloves, sterile drape, hand hygiene and skin antiseptic. A timeout was performed prior to the initiation of the procedure. Maximal barrier sterile technique utilized including caps, mask, sterile gowns, sterile gloves, large sterile drape, hand hygiene, and chlorhexadine skin prep. An angled catheter was advanced over a wire under fluoroscopic guidance through the nose, down the esophagus and into the body of the stomach. The stomach was then insufflated with several 100 ml of air. Fluoroscopy confirmed location of the  gastric bubble, as well as inferior displacement of the barium stained colon. Under direct fluoroscopic guidance, two T-tacks were placed, and the anterior gastric wall drawn up against the anterior abdominal wall. Percutaneous access was then obtained into the mid gastric body with an 18 gauge needle. Aspiration of air, and injection of contrast material under fluoroscopy confirmed needle placement. An Amplatz wire was advanced in the gastric body. The  tract was then serially dilated to 56 [place] and an 18 [place] peel-away sheath was advanced into the stomach. A 16 French into it percutaneous balloon retention gastrostomy tube was then lubricated and advanced through the peel-away sheath. The peel-away sheath was discarded. The retention balloon was inflated with 6 mL saline and pulled snug against the anterior abdominal wall. The balloon retention gastrostomy tube was then secured with the external bumper and capped. A hand injection of contrast material was performed confirming that the tube is intragastric in location. One of the T tack retention sutures was cut. The second was left in place. The external bumper was secured to the anterior abdominal wall using 2 0 Prolene sutures. The patient will be observed for several hours with the newly placed tube on low wall suction to evaluate for any post procedure complication. The patient tolerated the procedure well, there is no immediate complication. IMPRESSION: Successful placement of a 56 French balloon retention percutaneous gastrostomy tube. Electronically Signed   By: Jacqulynn Cadet M.D.   On: 07/08/2019 16:21   NM PET Image Initial (PI) Skull Base To Thigh  Result Date: 07/05/2019 CLINICAL DATA:  Initial treatment strategy for esophageal cancer. EXAM: NUCLEAR MEDICINE PET SKULL BASE TO THIGH TECHNIQUE: 11.01 mCi F-18 FDG was injected intravenously. Full-ring PET imaging was performed from the skull base to thigh after the radiotracer. CT data was obtained and used for attenuation correction and anatomic localization. Fasting blood glucose: 118 mg/dl COMPARISON:  CT abdomen and pelvis 05/06/2019 FINDINGS: Mediastinal blood pool activity: SUV max 2.81 Liver activity: SUV max NA NECK: No hypermetabolic lymph nodes in the neck. Incidental CT findings: none CHEST: Large mass in the distal esophagus with marked increased FDG uptake. Masslike thickening is circumferential (SUVmax = 24.6) (image 103, series 3  and series 4) wall thickening as much as 3.6 cm. In total the esophagus at this level measures 7.5 x 5.2 cm. Multiple mediastinal lymph nodes beginning from just below the thoracic inlet also with lymph nodes in the retrocrural region and below the diaphragm. (Image 59, series 3): High right paratracheal lymph node just below the thoracic inlet with rounded borders measuring 2.0 cm. (SUVmax = 28.1) Tiny lymph node just anterior to the aorta (image 91, series 4) difficult to visualize on CT (SUVmax = 7.1) Juxta aortic lymph node just to the left of long the anterior margin of the aorta (image 96, series 4): (SUVmax = 11 0.0) Small discrete foci of increased FDG uptake at the level of the esophageal hiatus, these appear distinct from the dominant mass, a small hepatic gastric lymph node (image 121, series 4) likely corresponds to the area of uptake seen just anterior to the aorta, perhaps with some variable respiration there is misregistration on the current study (SUVmax = 13.6 No area of increased FDG uptake within the lung parenchyma) Incidental CT findings: Lungs are clear aside from minimal scarring in the anterior right chest. No pleural effusion. Airways are patent. Calcified atheromatous plaque throughout the thoracic aorta. Calcified coronary artery disease. ABDOMEN/PELVIS: Left para-aortic lymph node with intense FDG  uptake (image 135, series 4) 10 mm (SUVmax = 20.1) hepato gastric lymph node as described. No areas of suspicious uptake within solid organs. There is an 8 mm right juxta crural lymph node (image 120, series 4) with similar uptake to the uptake seen in the hepato gastric lymph node. Incidental CT findings: CT appearance of liver, gallbladder, spleen, pancreas and adrenal glands is normal. Nonobstructing calculus in the upper pole the right kidney. No evidence of hydronephrosis. Left ureteral calculus measures 7 mm in the distal left ureter and is not associated with hydronephrosis, unchanged  compared to previous imaging. No acute gastrointestinal finding. Appendix is normal. Stool fills much of the colon. SKELETON: No focal hypermetabolic activity to suggest skeletal metastasis. Incidental CT findings: Spinal degenerative changes. IMPRESSION: 1. Signs of esophageal cancer with large esophageal mass with nodal involvement both above and below the diaphragm. 2. 7 mm distal left ureteral calculus is unchanged and does not lead to urinary tract obstruction. Electronically Signed   By: Zetta Bills M.D.   On: 07/05/2019 11:55    (Echo, Carotid, EGD, Colonoscopy, ERCP)    Subjective: Resting   Discharge Exam: Vitals:   07/11/19 2049 07/12/19 0530  BP: (!) 153/85 122/71  Pulse: 88 76  Resp: 15 16  Temp: 98 F (36.7 C) 98 F (36.7 C)  SpO2: 98% 96%   Vitals:   07/11/19 0525 07/11/19 1412 07/11/19 2049 07/12/19 0530  BP: 138/77 136/89 (!) 153/85 122/71  Pulse: 73 85 88 76  Resp: 18 16 15 16   Temp: 98.7 F (37.1 C) 98.4 F (36.9 C) 98 F (36.7 C) 98 F (36.7 C)  TempSrc: Oral Oral Oral Oral  SpO2: 95% 98% 98% 96%  Weight:      Height:        General: Pt is alert, awake, not in acute distress Cardiovascular: RRR, S1/S2 +, no rubs, no gallops Respiratory: CTA bilaterally, no wheezing, no rhonchi Abdominal: Soft, NT, ND, bowel sounds +peg in place  Extremities: no edema, no cyanosis    The results of significant diagnostics from this hospitalization (including imaging, microbiology, ancillary and laboratory) are listed below for reference.     Microbiology: Recent Results (from the past 240 hour(s))  SARS CORONAVIRUS 2 (TAT 6-24 HRS) Nasopharyngeal Nasopharyngeal Swab     Status: None   Collection Time: 07/05/19  8:28 PM   Specimen: Nasopharyngeal Swab  Result Value Ref Range Status   SARS Coronavirus 2 NEGATIVE NEGATIVE Final    Comment: (NOTE) SARS-CoV-2 target nucleic acids are NOT DETECTED. The SARS-CoV-2 RNA is generally detectable in upper and  lower respiratory specimens during the acute phase of infection. Negative results do not preclude SARS-CoV-2 infection, do not rule out co-infections with other pathogens, and should not be used as the sole basis for treatment or other patient management decisions. Negative results must be combined with clinical observations, patient history, and epidemiological information. The expected result is Negative. Fact Sheet for Patients: SugarRoll.be Fact Sheet for Healthcare Providers: https://www.woods-.com/ This test is not yet approved or cleared by the [place] FDA and  has been authorized for detection and/or diagnosis of SARS-CoV-2 by FDA under an Emergency Use Authorization (EUA). This EUA will remain  in effect (meaning this test can be used) for the duration of the COVID-19 declaration under Section 56 4(b)(1) of the Act, 21 U.S.C. section 360bbb-3(b)(1), unless the authorization is terminated or revoked sooner. Performed at [facility], [place] [address] [place], [place] [address]  Labs: BNP (last 3 results) No results for input(s): BNP in the last 8760 hours. Basic Metabolic Panel: Recent Labs  Lab 07/06/19 0615 07/08/19 0525 07/10/19 0526 07/11/19 0520 07/12/19 0521  NA 141 138 137 137 138  K 3.8 4.1 3.7 3.8 4.0  CL 107 103 104 105 104  CO2 24 27 27 24 26   GLUCOSE 103* 101* 133* 143* 126*  BUN 14 7* 10 11 12   CREATININE 1.22 1.32* 0.93 1.00 0.97  CALCIUM 9.9 9.6 9.8 9.8 10.0  MG  --   --  2.2 2.1 2.1  PHOS  --   --  2.7 2.9 2.9   Liver Function Tests: Recent Labs  Lab 07/05/19 2037  AST 16  ALT 15  ALKPHOS 54  BILITOT 1.1  PROT 6.8  ALBUMIN 3.8   No results for input(s): LIPASE, AMYLASE in the last 168 hours. No results for input(s): AMMONIA in the last 168 hours. CBC: Recent Labs  Lab 07/05/19 2037 07/06/19 0615 07/08/19 0525 07/10/19 0526  WBC 9.8 8.2 8.4 7.9  HGB 13.2 13.0 12.6*  12.7*  HCT 40.6 40.3 38.7* 39.0  MCV 92.3 92.2 92.8 90.7  PLT 285 252 243 243   Cardiac Enzymes: No results for input(s): CKTOTAL, CKMB, CKMBINDEX, TROPONINI in the last 168 hours. BNP: Invalid input(s): POCBNP CBG: Recent Labs  Lab 07/05/19 0947  GLUCAP 118*   D-Dimer No results for input(s): DDIMER in the last 72 hours. Hgb A1c No results for input(s): HGBA1C in the last 72 hours. Lipid Profile No results for input(s): CHOL, HDL, LDLCALC, TRIG, CHOLHDL, LDLDIRECT in the last 72 hours. Thyroid function studies No results for input(s): TSH, T4TOTAL, T3FREE, THYROIDAB in the last 72 hours.  Invalid input(s): FREET3 Anemia work up No results for input(s): VITAMINB12, FOLATE, FERRITIN, TIBC, IRON, RETICCTPCT in the last 72 hours. Urinalysis    Component Value Date/Time   COLORURINE YELLOW 05/06/2019 2159   APPEARANCEUR CLEAR 05/06/2019 2159   LABSPEC 1.027 05/06/2019 2159   PHURINE 5.0 05/06/2019 2159   GLUCOSEU NEGATIVE 05/06/2019 2159   HGBUR NEGATIVE 05/06/2019 2159   BILIRUBINUR NEGATIVE 05/06/2019 2159   KETONESUR 5 (A) 05/06/2019 2159   PROTEINUR NEGATIVE 05/06/2019 2159   NITRITE NEGATIVE 05/06/2019 2159   LEUKOCYTESUR NEGATIVE 05/06/2019 2159   Sepsis Labs Invalid input(s): PROCALCITONIN,  WBC,  LACTICIDVEN Microbiology Recent Results (from the past 240 hour(s))  SARS CORONAVIRUS 2 (TAT 6-24 HRS) Nasopharyngeal Nasopharyngeal Swab     Status: None   Collection Time: 07/05/19  8:28 PM   Specimen: Nasopharyngeal Swab  Result Value Ref Range Status   SARS Coronavirus 2 NEGATIVE NEGATIVE Final    Comment: (NOTE) SARS-CoV-2 target nucleic acids are NOT DETECTED. The SARS-CoV-2 RNA is generally detectable in upper and lower respiratory specimens during the acute phase of infection. Negative results do not preclude SARS-CoV-2 infection, do not rule out co-infections with other pathogens, and should not be used as the sole basis for treatment or other patient  management decisions. Negative results must be combined with clinical observations, patient history, and epidemiological information. The expected result is Negative. Fact Sheet for Patients: SugarRoll.be Fact Sheet for Healthcare Providers: https://www.woods-.com/ This test is not yet approved or cleared by the [place] FDA and  has been authorized for detection and/or diagnosis of SARS-CoV-2 by FDA under an Emergency Use Authorization (EUA). This EUA will remain  in effect (meaning this test can be used) for the duration of the COVID-19 declaration under Section 56 4(b)(1)  of the Act, 21 U.S.C. section 360bbb-3(b)(1), unless the authorization is terminated or revoked sooner. Performed at [facility], [place] [address], [place], [place] [address]      Time coordinating discharge:  38 minutes  SIGNED:   Georgette Shell, MD  [facility] 07/12/2019, 8:30 AM Pager   If 7PM-7AM, please contact night-coverage [email or web address] Password [email or web address]

## 2019-07-12 NOTE — TOC Transition Note (Signed)
Transition of Care [facility]) - CM/SW Discharge Note   Patient Details  Name: David [name] MRN: [id number] Date of Birth: Jul 14, 1945  [facility] CM/SW Contact:  Trish Mage, LCSW Phone Number: 07/12/2019, 1:41 PM   Clinical Narrative:  Patient to d/c home today with wife.  [place] services [PT, RN, aide] have been set up with [facility]. He is status post PEG tube placed 3/29. Dr signed enteral order form for [facility], and supplies will be delivered this afternoon to patient's room. No further needs identified.  TOC sign off.     Final next level of care: [place] Barriers to Discharge: No Barriers Identified   Patient Goals and CMS Choice        Discharge Placement                       Discharge Plan and Services                                     Social Determinants of Health (SDOH) Interventions     Readmission Risk Interventions No flowsheet data found.

## 2019-07-14 ENCOUNTER — Other Ambulatory Visit: Payer: Self-pay | Admitting: Oncology

## 2019-07-14 NOTE — Progress Notes (Signed)
Was called late 04/03 by [name] wife David [name] who was concerned because he had vomited.  She tells me he has been taking clear liquids and that he also had some applesauce.  He felt like things got stuck and brought it up.  There seems to have been no problem with the PEG feedings themselves.  I suggested they stop oral feedings until they can clear that with Dr. Benay Spice or his physicians assistant Monday, 07/15/2019.  I suggested flushing the PEG with water at the time of the call, since a feeding was due, and then proceed with regular PEG feedings 2 hours later.  They will call back if any other issue develops.

## 2019-07-15 ENCOUNTER — Ambulatory Visit: Payer: Medicare Other | Admitting: Radiation Oncology

## 2019-07-15 ENCOUNTER — Telehealth: Payer: Self-pay | Admitting: Psychiatry

## 2019-07-15 ENCOUNTER — Telehealth: Payer: Self-pay | Admitting: *Deleted

## 2019-07-15 NOTE — Progress Notes (Signed)
David [name] calls stating that over the weekend patient vomited after having applesauce.  She spoke to Dr. Jana Hakim on call and was told to keep him NPO until speaking to [place] today.  I consulted with Dr. Benay Spice and he states patient should only be having thin clear liquids no solid food, continue tube feedings as prescribed.  I informed her of this and she verbalized an understanding.  Also reminded her patient has follow up with Dr. Benay Spice on Thursday 4/8 at 9:00 am.

## 2019-07-15 NOTE — Telephone Encounter (Signed)
[name] called to report David [name] has stage 4 esophageal cancer. [name] reports all he does is sleep. Lost 12 lbs in 2 weeks. She wakes him to tube feed him then he goes back to sleep. Has  appt  4/7. Not sure if he can make it. Has another appt @ 9:00 with cancer doctor. Just feels may need to adjust some meds . Please advise @ [phone] ASAP

## 2019-07-15 NOTE — Telephone Encounter (Signed)
Called wife and confirmed appointment for 07/18/19 at 0900 w/Dr. Benay Spice.

## 2019-07-16 ENCOUNTER — Other Ambulatory Visit: Payer: Self-pay | Admitting: Medical

## 2019-07-16 ENCOUNTER — Telehealth: Payer: Self-pay | Admitting: Nutrition

## 2019-07-16 ENCOUNTER — Ambulatory Visit: Payer: Medicare Other

## 2019-07-16 ENCOUNTER — Ambulatory Visit: Payer: Medicare Other | Admitting: Nutrition

## 2019-07-16 ENCOUNTER — Encounter: Payer: Medicare Other | Admitting: Cardiothoracic Surgery

## 2019-07-16 MED ORDER — PROCHLORPERAZINE MALEATE 5 MG PO TABS: 5.0000 mg | ORAL_TABLET | Freq: Four times a day (QID) | ORAL | 0 refills | Status: DC | PRN

## 2019-07-16 NOTE — Progress Notes (Signed)
See telephone notes

## 2019-07-16 NOTE — Telephone Encounter (Signed)
I received a telephone call from [place] asking for return call. She is having difficulty finding enough time to administer bolus tube feedings via PEG to patient. She has to work full time and patient does not want to be on the pump at night. Patient has no tolerance issues. She has a pump which was delivered and she would like to use the pump for administering feedings one bottle at a time. Educated her to use one bottle Osmolite 1.5 via pump with rate set at 300 mL/hr. She will do this daily at about 7am, 12 noon and 5 pm. At about 8 pm, she will infuse the last 2 bottles of Osmolite 1.5 at 300 mL/hr which should take about 1 1/2 hours. She will flush the tube before and after each feeding with 60 mL water.  She does not want to use ProSource and requested to use Orgain Powder protein supplement which she purchased. Recommend she blend one scoop of Orgain protein powder with 8 oz water. She will infuse 4 oz BID between feedings. Stressed importance of flushing feeding tube with at least 60 mL free water after protein.  Reviewed importance of free water flushes and infusing formula at room temperature.  She would like to use a different, more organic/healthier product which we agreed to discuss at next appointment. ([employer]ends or [employer])  Questions answered and teach back method used. Follow up scheduled on Monday at 4 pm after radiation. I will complete a full assessment at that time.

## 2019-07-17 ENCOUNTER — Encounter: Payer: Self-pay | Admitting: Psychiatry

## 2019-07-17 ENCOUNTER — Other Ambulatory Visit: Payer: Self-pay | Admitting: Psychiatry

## 2019-07-17 ENCOUNTER — Encounter: Payer: Self-pay | Admitting: *Deleted

## 2019-07-17 ENCOUNTER — Ambulatory Visit: Payer: Medicare Other

## 2019-07-17 ENCOUNTER — Ambulatory Visit (INDEPENDENT_AMBULATORY_CARE_PROVIDER_SITE_OTHER): Payer: Medicare Other | Admitting: Psychiatry

## 2019-07-17 DIAGNOSIS — F29 Unspecified psychosis not due to a substance or known physiological condition: Secondary | ICD-10-CM

## 2019-07-17 DIAGNOSIS — F411 Generalized anxiety disorder: Secondary | ICD-10-CM | POA: Diagnosis not present

## 2019-07-17 DIAGNOSIS — F330 Major depressive disorder, recurrent, mild: Secondary | ICD-10-CM | POA: Diagnosis not present

## 2019-07-17 MED ORDER — OLANZAPINE 2.5 MG PO TABS: 2.5000 mg | ORAL_TABLET | Freq: Every day | ORAL | 1 refills | Status: DC

## 2019-07-17 NOTE — Telephone Encounter (Signed)
Is he continuing the aricept and namenda as well?

## 2019-07-17 NOTE — Progress Notes (Signed)
David [name] [id number] [birth year] 74 y.o.  Virtual Visit via Video Note  I connected with pt @ on 07/17/19 at  2:00 PM EDT by a video enabled telemedicine application and verified that I am speaking with the correct person using two identifiers.   I discussed the limitations of evaluation and management by telemedicine and the availability of in person appointments. The patient expressed understanding and agreed to proceed.  I discussed the assessment and treatment plan with the patient. The patient was provided an opportunity to ask questions and all were answered. The patient agreed with the plan and demonstrated an understanding of the instructions.   The patient was advised to call back or seek an in-person evaluation if the symptoms worsen or if the condition fails to improve as anticipated.  I provided 50 minutes of non-face-to-face time during this encounter.  The patient was located at home.  The provider was located at [place].   [name], PMHNP   Subjective:   Patient ID:  David [name] is a 74 y.o. (DOB 1945-05-24) male.  Chief Complaint:  Chief Complaint  Patient presents with  . Follow-up    Anxiety, Depression, Insomnia, recent confusion    HPI David [name] presents for follow-up of anxiety, depression, and insomnia. 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 Admitted to [facility] on 07/05/19 and was discharged on 07/12/19. Had PEG inserted. Has home health care. Denied SNF due to mobility and balance being adequate.  They report that he has been very lethargic and sleeping frequently, both during the night and day. Energy is very low and frequently c/o feeling tired. He describes himself as, "Dazed and confused." Has lost at least 13 lbs.   He reports that he feels disoriented, which in turn provokes anxiety. He reports difficulty keeping track of time. He reports that he is  feeling sad "off and on." Some rumination about thinking he is supposed to be somewhere. Some difficulty with concentration. Denies VH or AH. Denies paranoia. Denies SI.   Pt is now on clear liquids and tube feedings. He is currently staying at house with [name].   [name] is working full-time.   Review of Systems:  Review of Systems  Constitutional: Positive for fatigue and unexpected weight change.  HENT:       Swallowing medications without difficulty at this time  Gastrointestinal:       Tube feedings  Musculoskeletal: Negative for gait problem.  Neurological: Negative for tremors.  Psychiatric/Behavioral:       Please refer to HPI    Medications: I have reviewed the patient's current medications.  Current Outpatient Medications  Medication Sig Dispense Refill  . Amino Acids-Protein Hydrolys (FEEDING SUPPLEMENT, PRO-STAT SUGAR FREE 64,) LIQD Take 30 mLs by mouth 2 (two) times daily. 887 mL 0  . buPROPion (WELLBUTRIN XL) 150 MG 24 hr tablet TAKE 1 TABLET ONCE DAILY. 28 tablet 0  . Cholecalciferol (VITAMIN D3 PO) Take 2,000 Units by mouth daily.    Marland Kitchen donepezil (ARICEPT) 5 MG tablet TAKE ONE TABLET AT BEDTIME. 28 tablet 0  . escitalopram (LEXAPRO) 20 MG tablet TAKE 1 TABLET ONCE DAILY. 28 tablet 0  . LORazepam (ATIVAN) 0.5 MG tablet Take 1 tablet (0.5 mg total) by mouth every 6 (six) hours as needed for anxiety. 30 tablet 0  . memantine (NAMENDA) 10 MG tablet TAKE 1 TABLET BY MOUTH TWICE DAILY. 56 tablet 0  .  Nutritional Supplements (FEEDING SUPPLEMENT, OSMOLITE 1.5 CAL,) LIQD Osmolite 1.5-- 237 mL 5 times a day flush with 50 mL of free water before and 50 mL of free water after the bolus. 237 mL 12  . OLANZapine (ZYPREXA) 2.5 MG tablet Take 1 tablet (2.5 mg total) by mouth at bedtime. 30 tablet 1  . Tamsulosin HCl (FLOMAX) 0.4 MG CAPS Take 0.4 mg by mouth daily.     . vitamin B-12 (CYANOCOBALAMIN) 1000 MCG tablet Take 1,000 mcg by mouth daily.    Marland Kitchen HYDROcodone-acetaminophen  (NORCO/VICODIN) 5-325 MG tablet Take 1-2 tablets by mouth every 4 (four) hours as needed for moderate pain. (Patient not taking: Reported on 07/17/2019) 30 tablet 0  . prochlorperazine (COMPAZINE) 5 MG tablet Take 1 tablet (5 mg total) by mouth every 6 (six) hours as needed for nausea or vomiting. May take orally or crush and take via tube. 45 tablet 0   No current facility-administered medications for this visit.    Medication Side Effects: Sedation  Allergies:  Allergies  Allergen Reactions  . Other     Other reaction(s): Other Sleep walk   . Abilify [Aripiprazole] Other (See Comments)    Short term memory loss  . Lamictal [Lamotrigine]     Increased agitation  . Wellbutrin [Bupropion] Anxiety    Patient does not recall allergy type Anxiety, confusion per Dr Edwin Dada note    Past Medical History:  Diagnosis Date  . Anxiety   . BPH (benign prostatic hyperplasia)   . Dementia ([place])   . Depression   . Hyperlipidemia   . Hypertension   . Kidney stone   . Memory loss   . 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 Sleep apnea    CPAP  . Vitamin D deficiency     Family History  Problem Relation Age of Onset  . Alzheimer's disease Mother   . Depression Mother   . Cancer Father        prostate  . Thyroid disease Father   . Alzheimer's disease Maternal Grandmother   . Heart disease Maternal Grandfather   . Stroke Paternal Grandmother   . Stroke Paternal Grandfather   . Alcoholism Brother   . Autism Daughter   . Autism Maternal Uncle     Social History   Socioeconomic History  . Marital status: Married    Spouse name: [name]  . Number of children: 2  . Years of education: 50  . 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 Smoking status: Never Smoker  . Smokeless tobacco: Never Used  Substance and Sexual Activity  . Alcohol use: Yes    Alcohol/week: 1.0 - 2.0 standard drinks    Types: 1  - 2 Shots of liquor per week    Comment: 1-2 shots 4-5 x week  . Drug use: Yes    Types: Marijuana    Comment: Occasional  . Sexual activity: Never  Other Topics Concern  . Not on file  Social History Narrative   Lives alone retired.  Education: BS, MS Phd chemist.  Separated 8 yrs a/o spring 2020.  Adult children son, out of home, and daughter, adult with autistic spectrum, lives with estranged wife.  2 gks.  Mother died at age 22, of COVID, in the [place] outbreak -- emotionally destabilizing event late May/early June.        Cokes 0-3 daily as reported by nursing Sept  2019.  Gets medications via blister pack prepared by pharmacy a/o spring 2020, with generally good compliance but some mixing up of times and dates.   Social Determinants of Health   Financial Resource Strain:   . Difficulty of Paying Living Expenses:   Food Insecurity:   . Worried About Charity fundraiser in the Last Year:   . Arboriculturist in the Last Year:   Transportation Needs:   . Film/video editor (Medical):   Marland Kitchen Lack of Transportation (Non-Medical):   Physical Activity:   . Days of Exercise per Week:   . Minutes of Exercise per Session:   Stress:   . Feeling of Stress :   Social Connections:   . Frequency of Communication with Friends and Family:   . Frequency of Social Gatherings with Friends and Family:   . Attends Religious Services:   . Active Member of Clubs or Organizations:   . Attends Archivist Meetings:   Marland Kitchen Marital Status:   Intimate Partner Violence:   . Fear of Current or Ex-Partner:   . Emotionally Abused:   Marland Kitchen Physically Abused:   . Sexually Abused:     Past Medical History, Surgical history, Social history, and Family history were reviewed and updated as appropriate.   Please see review of systems for further details on the patient's review from today.   Objective:   Physical Exam:  There were no vitals taken for this visit.  Physical Exam Constitutional:       Comments: Pt napping at times.  Neurological:     Mental Status: Mental status is at baseline. He is disoriented.     Cranial Nerves: No dysarthria.  Psychiatric:        Attention and Perception: He is inattentive.        Mood and Affect: Mood is anxious.        Speech: Speech is delayed.        Behavior: Behavior is slowed. Behavior is cooperative.        Thought Content: Thought content is not paranoid or delusional. Thought content does not include homicidal or suicidal ideation. Thought content does not include homicidal or suicidal plan.        Cognition and Memory: Cognition is impaired. Memory is impaired. He exhibits impaired recent memory and impaired remote memory.        Judgment: Judgment normal.     Comments: Insight fair     Lab Review:     Component Value Date/Time   NA 138 07/12/2019 0521   K 4.0 07/12/2019 0521   CL 104 07/12/2019 0521   CO2 26 07/12/2019 0521   GLUCOSE 126 (H) 07/12/2019 0521   BUN 12 07/12/2019 0521   CREATININE 0.97 07/12/2019 0521   CALCIUM 10.0 07/12/2019 0521   PROT 6.8 07/05/2019 2037   ALBUMIN 3.8 07/05/2019 2037   AST 16 07/05/2019 2037   ALT 15 07/05/2019 2037   ALKPHOS 54 07/05/2019 2037   BILITOT 1.1 07/05/2019 2037   GFRNONAA >60 07/12/2019 0521   GFRAA >60 07/12/2019 0521       Component Value Date/Time   WBC 7.9 07/10/2019 0526   RBC 4.30 07/10/2019 0526   HGB 12.7 (L) 07/10/2019 0526   HCT 39.0 07/10/2019 0526   PLT 243 07/10/2019 0526   MCV 90.7 07/10/2019 0526   MCH 29.5 07/10/2019 0526   MCHC 32.6 07/10/2019 0526   RDW 12.0 07/10/2019 0526    No results  found for: POCLITH, LITHIUM   No results found for: PHENYTOIN, PHENOBARB, VALPROATE, CBMZ   .res Assessment: Plan:   Spent 60 minutes total with 50 minutes talking with caregiver and pt and additional time calling pharmacy and reviewing record.  Discussed possible dose reductions in medications to improve excessive somnolence.  Reviewed medications with  caregiver and discussed which medications could potentially cause some drowsiness.  Discussed decreasing olanzapine to possibly improve drowsiness.  Recommended gradual dose reduction instead of abruptly stopping medication and advised caregiver to contact office if she notices any insomnia, recurrence of psychotic s/s, or worsening mood or anxiety. Will decrease Olanzapine to 2.5 mg po QHS. Discussed stopping scheduled Ativan 0.5 mg po QHS and using Ativan only as needed to minimize excessive somnolence. Discussed continuing Lexapro 20 mg daily for mood and anxiety signs and symptoms. Will continue Wellbutrin XL for depression, particularly since this is unlikely to cause fatigue. Contacted pharmacy to discuss medication changes since patient has been receiving pill packs from pharmacy.  Informed pharmacy that there has been a change in patient's condition and that he is now staying with Rudene Anda and she is assisting with medication management since lorazepam had been changed to nightly in the past since patient had difficulty managing prn medication when he was living independently.  Requested that pharmacy contact [name] to provide assistance with identifying medications since [name] is unsure which medications she needs to remove from patient's nightly pill pack.  Pharmacy offered to contact [facility] and stated that they may re-do pill packs since 2 medications have changed. Discussed with caregiver that olanzapine is available in an orally disintegrating tablet and some of his medications are available in liquid form if he begins to have difficulty swallowing medications.  Caregiver reports that she will notify office if patient has difficulty swallowing meds in the future. Informed caregiver that [name], PhD has offered to provide any support for assistance needed and case discussed with Dr. Rica Mote. Patient to follow-up with this provider in 2 to 3 weeks or sooner if clinically  indicated. Patient advised to contact office with any questions, adverse effects, or acute worsening in signs and symptoms.   Harshith was seen today for follow-up.  Diagnoses and all orders for this visit:  Psychosis, unspecified psychosis type ([place]) -     OLANZapine (ZYPREXA) 2.5 MG tablet; Take 1 tablet (2.5 mg total) by mouth at bedtime.  Generalized anxiety disorder -     OLANZapine (ZYPREXA) 2.5 MG tablet; Take 1 tablet (2.5 mg total) by mouth at bedtime.     Please see After Visit Summary for patient specific instructions.  Future Appointments  Date Time Provider [place]  07/18/2019  9:00 AM Ladell Pier, MD [facility] None  07/19/2019 12:00 PM [facility] 1 [facility] [place]  07/22/2019  3:00 PM [facility] LINAC 4 [facility] None  07/22/2019  4:00 PM Neff, Barbara L, RD [facility] None  07/23/2019  3:00 PM [facility] LINAC 4 [facility] None  07/24/2019  3:15 PM [facility] LINAC 4 [facility] None  07/25/2019  3:15 PM [facility] LINAC 4 [facility] None  07/26/2019  3:15 PM [facility] LINAC 4 [facility] None  07/29/2019  3:15 PM [facility] LINAC 4 [facility] None  07/30/2019  3:15 PM [facility] LINAC 4 [facility] None  07/31/2019  3:15 PM [facility] LINAC 4 [facility] None  08/01/2019  3:15 PM [facility] LINAC 4 [facility] None  08/02/2019  3:15 PM [facility] LINAC 4 [facility] None  08/05/2019  3:15 PM [facility] LINAC 4 [facility] None  08/06/2019  3:15 PM [facility] LINAC 4 [facility] None  08/07/2019  3:15 PM [facility] LINAC 4 [facility] None  08/08/2019  3:15 PM [facility] LINAC 4 [facility] None  08/09/2019  3:15 PM [facility] LINAC 4 [facility] None  08/12/2019  3:15 PM [facility] LINAC 4 [facility] None  08/13/2019  3:15 PM [facility] LINAC 4 [facility] None  08/14/2019  3:15 PM [facility] LINAC 4 [facility] None  08/15/2019  3:15 PM [facility] LINAC 4 [facility] None  08/16/2019  3:15 PM [facility] LINAC 4 [facility] None  08/19/2019  3:30  PM [facility] LINAC 4 [facility] None  08/20/2019  3:30 PM [facility] LINAC 4 [facility] None  08/21/2019  3:30 PM [facility] LINAC 4 [facility] None  08/22/2019  3:30 PM [facility] LINAC 4 [facility] None  08/23/2019  3:30 PM [facility] LINAC 4 [facility] None  08/26/2019  3:30 PM [facility] LINAC 4 [facility] None  08/27/2019  3:30 PM [facility] LINAC 4 [facility] None  08/28/2019  3:30 PM [facility] LINAC 4 [facility] None    No orders of the defined types were placed in this encounter.     -------------------------------

## 2019-07-17 NOTE — Progress Notes (Signed)
[place] Clinical Social Work  Per [employer], [facility] was scheduled to complete a home care assessment on 07/15/19, and begin paperwork for memory care center.    Johnnye Lana, MSW, LCSW, OSW-C Clinical Social Worker [facility] [phone]

## 2019-07-17 NOTE — Progress Notes (Signed)
Admin note for non-service contact  Patient ID: [name]  MRN: [id number] DATE: 07/17/2019  Word received that Pt apologized for noshow, though mistaken about which date/time he should have been here.  Further word received from [name] that he has been confirmed esophageal CA IV and was recently hospitalized.  Ms. Eulas Post to convey condolences and well-wishes at today's med check and prompt [name] to state whether further psychotherapy services are of interest.  Blanchie Serve, PhD [name], PhD LP Clinical Psychologist, [facility] [address], [place], [place] [address] [phone]

## 2019-07-18 ENCOUNTER — Ambulatory Visit: Payer: Medicare Other | Admitting: Radiation Oncology

## 2019-07-18 ENCOUNTER — Inpatient Hospital Stay: Payer: Medicare Other | Attending: Oncology | Admitting: Oncology

## 2019-07-18 ENCOUNTER — Other Ambulatory Visit: Payer: Self-pay

## 2019-07-18 ENCOUNTER — Ambulatory Visit: Payer: Medicare Other

## 2019-07-18 VITALS — BP 137/83 | HR 87 | Temp 98.0°F | Resp 18 | Ht 74.0 in | Wt 219.6 lb

## 2019-07-18 DIAGNOSIS — C155 Malignant neoplasm of lower third of esophagus: Secondary | ICD-10-CM

## 2019-07-18 DIAGNOSIS — K920 Hematemesis: Secondary | ICD-10-CM | POA: Diagnosis not present

## 2019-07-18 NOTE — Progress Notes (Addendum)
[place] OFFICE PROGRESS NOTE   Diagnosis: Esophagus cancer  INTERVAL HISTORY:   David [name] was discharged from the hospital on 07/12/2019 after being admitted with severe dysphagia.  He underwent placement of a gastrostomy tube prior to discharge.  He has now using the gastrostomy tube for feedings.  His wife is performing bolus feedings.  He continues to have severe dysphagia.  He has emesis when he tries to eat or drink.  Objective:  Vital signs in last 24 hours:  Blood pressure 137/83, pulse 87, temperature 98 F (36.7 C), temperature source Temporal, resp. rate 18, height _0  (1.88 m), weight 219 lb 9.6 oz (99.6 kg), SpO2 98 %.    Resp: Lungs clear bilaterally Cardio: Regular rate and rhythm GI: No hepatomegaly, left upper quadrant feeding tube site without evidence of infection Vascular: No leg edema Neuro: Alert, follows commands    Lab Results:  Lab Results  Component Value Date   WBC 7.9 07/10/2019   HGB 12.7 (L) 07/10/2019   HCT 39.0 07/10/2019   MCV 90.7 07/10/2019   PLT 243 07/10/2019    CMP  Lab Results  Component Value Date   NA 138 07/12/2019   K 4.0 07/12/2019   CL 104 07/12/2019   CO2 26 07/12/2019   GLUCOSE 126 (H) 07/12/2019   BUN 12 07/12/2019   CREATININE 0.97 07/12/2019   CALCIUM 10.0 07/12/2019   PROT 6.8 07/05/2019   ALBUMIN 3.8 07/05/2019   AST 16 07/05/2019   ALT 15 07/05/2019   ALKPHOS 54 07/05/2019   BILITOT 1.1 07/05/2019   GFRNONAA >60 07/12/2019   GFRAA >60 07/12/2019     Medications: I have reviewed the patient's current medications.   Assessment/Plan: 1. Esophagus cancer ? CT 05/06/2019-circumferential thickening of the distal esophagus, mild splenomegaly, bladder wall thickening, enlarged prostate ? Upper endoscopy 06/11/2019-ulcerated mass at the lower esophagus, 36 cm from the incisors, partially obstructing, biopsy revealed atypical cells-reactive change versus poorly differentiated neoplasm, outside  consultation at [facility] on this biopsy and a repeat biopsy 06/21/2019 confirmed a poorly differentiated malignant neoplasm ? HER-2 negative, PD-L1 combined positive score -20 ? Repeat endoscopy 06/21/2019-partially obstructing esophageal tumor found in the lower third of the esophagus ? PET scan 07/05/2019-large esophageal mass with nodal involvement both above and below the diaphragm including a left periaortic node 2. Dementia 3. Anxiety/depression 4. Sleep apnea 5. BPH 6. Dysphagia secondary to #1  Gastrostomy tube placement 07/08/2019 7. Hyperlipidemia    Disposition: David [name] appears unchanged.  He continues to have severe solid/liquid dysphagia.  I discussed the prognosis with David [name] and his wife again today.  They understand no therapy will be curative based on the extent of metastatic lymph node involvement.  He is symptomatic with dysphagia.  I recommend proceeding with palliative radiation in an attempt to relieve the dysphagia.  He will continue gastrostomy tube feedings as directed by the Cancer center nutritionist.  I recommended he not try to eat or drink.  He can use mouth rinse, popsicles, and candy for taste.  I contacted the radiation oncology service.  They will begin radiation on 07/22/2019.  The PD-L1 expression returned positive.  I recommend treatment with single agent pembrolizumab at the completion of radiation.  We reviewed potential toxicities associated with pembrolizumab including the chance of a rash, diarrhea, pneumonitis, hepatitis, hypothyroidism, and other autoimmune toxicities.  David [name] will be scheduled for an office visit on 07/30/2019.  Betsy Coder, MD  07/18/2019  1:33  PM   

## 2019-07-19 ENCOUNTER — Ambulatory Visit: Payer: Medicare Other

## 2019-07-19 ENCOUNTER — Telehealth: Payer: Self-pay

## 2019-07-19 ENCOUNTER — Other Ambulatory Visit (HOSPITAL_COMMUNITY): Payer: Medicare Other

## 2019-07-19 ENCOUNTER — Telehealth: Payer: Self-pay | Admitting: Oncology

## 2019-07-19 DIAGNOSIS — C155 Malignant neoplasm of lower third of esophagus: Secondary | ICD-10-CM | POA: Diagnosis not present

## 2019-07-19 NOTE — Progress Notes (Signed)
After receiving call from patient's wife desiring to do continuous tube feedings, I contacted Thersa Salt at [facility], received new orders, had Dr. Benay Spice to sign and faxed back to [place] at [phone], sent to HIM for scanning to chart.

## 2019-07-19 NOTE — Progress Notes (Signed)
Faxed back signed orders by Dr. Benay Spice for Osmolite to [place] at [phone], received confirmation went through, sent to HIM for scan to the chart.

## 2019-07-19 NOTE — Progress Notes (Signed)
Patient's wife calls stating that she would like to do continuous tube feedings, she feel like she is unable to get all his feedings in.  I contacted Thersa Salt at [facility] for help with this issue.  She states she will call the patient's wife back and try to coordinate their needs.

## 2019-07-19 NOTE — Telephone Encounter (Signed)
See telephone note.

## 2019-07-19 NOTE — Telephone Encounter (Signed)
Scheduled per los. Put on [facility] schedule due to overlapping appt at requested time. Called patient. No answer. Mailed printout

## 2019-07-21 ENCOUNTER — Other Ambulatory Visit: Payer: Self-pay

## 2019-07-21 ENCOUNTER — Encounter (HOSPITAL_COMMUNITY): Payer: Self-pay | Admitting: Emergency Medicine

## 2019-07-21 ENCOUNTER — Inpatient Hospital Stay (HOSPITAL_COMMUNITY)
Admission: EM | Admit: 2019-07-21 | Discharge: 2019-07-25 | DRG: 375 | Disposition: A | Discharge status: Home Health | Payer: Medicare Other | Attending: Family Medicine | Admitting: Family Medicine

## 2019-07-21 DIAGNOSIS — Z87442 Personal history of urinary calculi: Secondary | ICD-10-CM

## 2019-07-21 DIAGNOSIS — C155 Malignant neoplasm of lower third of esophagus: Secondary | ICD-10-CM | POA: Diagnosis present

## 2019-07-21 DIAGNOSIS — Z820 Family history of epilepsy and other diseases of the nervous system: Secondary | ICD-10-CM | POA: Diagnosis not present

## 2019-07-21 DIAGNOSIS — F419 Anxiety disorder, unspecified: Secondary | ICD-10-CM | POA: Diagnosis present

## 2019-07-21 DIAGNOSIS — R1310 Dysphagia, unspecified: Secondary | ICD-10-CM | POA: Diagnosis present

## 2019-07-21 DIAGNOSIS — I10 Essential (primary) hypertension: Secondary | ICD-10-CM | POA: Diagnosis present

## 2019-07-21 DIAGNOSIS — E785 Hyperlipidemia, unspecified: Secondary | ICD-10-CM | POA: Diagnosis present

## 2019-07-21 DIAGNOSIS — F329 Major depressive disorder, single episode, unspecified: Secondary | ICD-10-CM | POA: Diagnosis present

## 2019-07-21 DIAGNOSIS — F6081 Narcissistic personality disorder: Secondary | ICD-10-CM | POA: Diagnosis present

## 2019-07-21 DIAGNOSIS — R161 Splenomegaly, not elsewhere classified: Secondary | ICD-10-CM | POA: Diagnosis present

## 2019-07-21 DIAGNOSIS — Z931 Gastrostomy status: Secondary | ICD-10-CM

## 2019-07-21 DIAGNOSIS — Z79899 Other long term (current) drug therapy: Secondary | ICD-10-CM | POA: Diagnosis not present

## 2019-07-21 DIAGNOSIS — Z8249 Family history of ischemic heart disease and other diseases of the circulatory system: Secondary | ICD-10-CM

## 2019-07-21 DIAGNOSIS — Z9842 Cataract extraction status, left eye: Secondary | ICD-10-CM | POA: Diagnosis not present

## 2019-07-21 DIAGNOSIS — Z888 Allergy status to other drugs, medicaments and biological substances status: Secondary | ICD-10-CM

## 2019-07-21 DIAGNOSIS — Z20822 Contact with and (suspected) exposure to covid-19: Secondary | ICD-10-CM | POA: Diagnosis present

## 2019-07-21 DIAGNOSIS — Z818 Family history of other mental and behavioral disorders: Secondary | ICD-10-CM | POA: Diagnosis not present

## 2019-07-21 DIAGNOSIS — G4733 Obstructive sleep apnea (adult) (pediatric): Secondary | ICD-10-CM | POA: Diagnosis present

## 2019-07-21 DIAGNOSIS — K920 Hematemesis: Secondary | ICD-10-CM | POA: Diagnosis present

## 2019-07-21 DIAGNOSIS — N400 Enlarged prostate without lower urinary tract symptoms: Secondary | ICD-10-CM | POA: Diagnosis present

## 2019-07-21 DIAGNOSIS — F0390 Unspecified dementia without behavioral disturbance: Secondary | ICD-10-CM | POA: Diagnosis present

## 2019-07-21 DIAGNOSIS — E559 Vitamin D deficiency, unspecified: Secondary | ICD-10-CM | POA: Diagnosis present

## 2019-07-21 DIAGNOSIS — Z9841 Cataract extraction status, right eye: Secondary | ICD-10-CM

## 2019-07-21 DIAGNOSIS — N179 Acute kidney failure, unspecified: Secondary | ICD-10-CM | POA: Diagnosis present

## 2019-07-21 HISTORY — DX: Malignant (primary) neoplasm, unspecified: C80.1

## 2019-07-21 LAB — CBC WITH DIFFERENTIAL/PLATELET
Abs Immature Granulocytes: 0.07 10*3/uL (ref 0.00–0.07)
Basophils Absolute: 0 10*3/uL (ref 0.0–0.1)
Basophils Relative: 0 %
Eosinophils Absolute: 0.1 10*3/uL (ref 0.0–0.5)
Eosinophils Relative: 0 %
HCT: 40.6 % (ref 39.0–52.0)
Hemoglobin: 13.1 g/dL (ref 13.0–17.0)
Immature Granulocytes: 1 %
Lymphocytes Relative: 11 %
Lymphs Abs: 1.3 10*3/uL (ref 0.7–4.0)
MCH: 29.3 pg (ref 26.0–34.0)
MCHC: 32.3 g/dL (ref 30.0–36.0)
MCV: 90.8 fL (ref 80.0–100.0)
Monocytes Absolute: 0.9 10*3/uL (ref 0.1–1.0)
Monocytes Relative: 7 %
Neutro Abs: 9.4 10*3/uL — ABNORMAL HIGH (ref 1.7–7.7)
Neutrophils Relative %: 81 %
Platelets: 328 10*3/uL (ref 150–400)
RBC: 4.47 MIL/uL (ref 4.22–5.81)
RDW: 12.3 % (ref 11.5–15.5)
WBC: 11.7 10*3/uL — ABNORMAL HIGH (ref 4.0–10.5)
nRBC: 0 % (ref 0.0–0.2)

## 2019-07-21 LAB — CBC
HCT: 39.3 % (ref 39.0–52.0)
HCT: 37.6 % — ABNORMAL LOW (ref 39.0–52.0)
Hemoglobin: 13 g/dL (ref 13.0–17.0)
Hemoglobin: 12 g/dL — ABNORMAL LOW (ref 13.0–17.0)
MCH: 29.7 pg (ref 26.0–34.0)
MCH: 29.4 pg (ref 26.0–34.0)
MCHC: 33.1 g/dL (ref 30.0–36.0)
MCHC: 31.9 g/dL (ref 30.0–36.0)
MCV: 89.9 fL (ref 80.0–100.0)
MCV: 92.2 fL (ref 80.0–100.0)
Platelets: 317 10*3/uL (ref 150–400)
Platelets: 283 10*3/uL (ref 150–400)
RBC: 4.37 MIL/uL (ref 4.22–5.81)
RBC: 4.08 MIL/uL — ABNORMAL LOW (ref 4.22–5.81)
RDW: 12.2 % (ref 11.5–15.5)
RDW: 12.2 % (ref 11.5–15.5)
WBC: 11.9 10*3/uL — ABNORMAL HIGH (ref 4.0–10.5)
WBC: 10.9 10*3/uL — ABNORMAL HIGH (ref 4.0–10.5)
nRBC: 0 % (ref 0.0–0.2)
nRBC: 0 % (ref 0.0–0.2)

## 2019-07-21 LAB — RESPIRATORY PANEL BY RT PCR (FLU A&B, COVID)
Influenza A by PCR: NEGATIVE
Influenza B by PCR: NEGATIVE
SARS Coronavirus 2 by RT PCR: NEGATIVE

## 2019-07-21 LAB — COMPREHENSIVE METABOLIC PANEL
ALT: 27 U/L (ref 0–44)
AST: 21 U/L (ref 15–41)
Albumin: 3.8 g/dL (ref 3.5–5.0)
Alkaline Phosphatase: 60 U/L (ref 38–126)
Anion gap: 11 (ref 5–15)
BUN: 31 mg/dL — ABNORMAL HIGH (ref 8–23)
CO2: 28 mmol/L (ref 22–32)
Calcium: 11.3 mg/dL — ABNORMAL HIGH (ref 8.9–10.3)
Chloride: 100 mmol/L (ref 98–111)
Creatinine, Ser: 1.41 mg/dL — ABNORMAL HIGH (ref 0.61–1.24)
GFR calc Af Amer: 57 mL/min — ABNORMAL LOW (ref >60)
GFR calc non Af Amer: 49 mL/min — ABNORMAL LOW (ref >60)
Glucose, Bld: 122 mg/dL — ABNORMAL HIGH (ref 70–99)
Potassium: 4.1 mmol/L (ref 3.5–5.1)
Sodium: 139 mmol/L (ref 135–145)
Total Bilirubin: 0.8 mg/dL (ref 0.3–1.2)
Total Protein: 7 g/dL (ref 6.5–8.1)

## 2019-07-21 LAB — PROTIME-INR
INR: 1 (ref 0.8–1.2)
Prothrombin Time: 13.2 seconds (ref 11.4–15.2)

## 2019-07-21 LAB — SAMPLE TO BLOOD BANK

## 2019-07-21 LAB — OCCULT BLOOD GASTRIC / DUODENUM (SPECIMEN CUP)
Occult Blood, Gastric: POSITIVE — AB
pH, Gastric: 7

## 2019-07-21 LAB — POC OCCULT BLOOD, ED: Fecal Occult Bld: POSITIVE — AB

## 2019-07-21 LAB — APTT: aPTT: 29 seconds (ref 24–36)

## 2019-07-21 MED ORDER — SODIUM CHLORIDE 0.9 % IV SOLN: INTRAVENOUS | Status: AC

## 2019-07-21 MED ORDER — PANTOPRAZOLE SODIUM 40 MG IV SOLR
40.0000 mg | Freq: Two times a day (BID) | INTRAVENOUS | Status: DC
  Administered 2019-07-21 – 2019-07-25 (×9): 40 mg via INTRAVENOUS
  Filled 2019-07-21 (×12): qty 40

## 2019-07-21 MED ORDER — SODIUM CHLORIDE 0.9 % IV SOLN: INTRAVENOUS | Status: DC

## 2019-07-21 MED ORDER — HYDROCODONE-ACETAMINOPHEN 5-325 MG PO TABS
1.0000 | ORAL_TABLET | Freq: Four times a day (QID) | ORAL | Status: DC | PRN
  Filled 2019-07-21: qty 1

## 2019-07-21 MED ORDER — SODIUM CHLORIDE 0.9 % IV BOLUS
1000.0000 mL | Freq: Once | INTRAVENOUS | Status: AC
  Administered 2019-07-21: 1000 mL via INTRAVENOUS

## 2019-07-21 MED ORDER — ONDANSETRON HCL 4 MG/2ML IJ SOLN
4.0000 mg | Freq: Four times a day (QID) | INTRAMUSCULAR | Status: DC | PRN
  Filled 2019-07-21: qty 2

## 2019-07-21 MED ORDER — MEMANTINE HCL 10 MG PO TABS
10.0000 mg | ORAL_TABLET | Freq: Two times a day (BID) | ORAL | Status: DC
  Administered 2019-07-21 (×2): 10 mg via ORAL
  Filled 2019-07-21 (×2): qty 1

## 2019-07-21 MED ORDER — PRO-STAT SUGAR FREE PO LIQD
30.0000 mL | Freq: Two times a day (BID) | ORAL | Status: DC
  Administered 2019-07-21: 13:00:00 30 mL via ORAL
  Filled 2019-07-21 (×2): qty 30

## 2019-07-21 MED ORDER — BUPROPION HCL ER (XL) 150 MG PO TB24
150.0000 mg | ORAL_TABLET | Freq: Every day | ORAL | Status: DC
  Administered 2019-07-21 – 2019-07-25 (×4): 150 mg via ORAL
  Filled 2019-07-21 (×4): qty 1

## 2019-07-21 MED ORDER — DONEPEZIL HCL 5 MG PO TABS
5.0000 mg | ORAL_TABLET | Freq: Every day | ORAL | Status: DC
  Administered 2019-07-21: 22:00:00 5 mg via ORAL
  Filled 2019-07-21: qty 1

## 2019-07-21 MED ORDER — PROCHLORPERAZINE MALEATE 10 MG PO TABS: 5.0000 mg | ORAL_TABLET | Freq: Four times a day (QID) | ORAL | Status: DC | PRN

## 2019-07-21 MED ORDER — LORAZEPAM 0.5 MG PO TABS
0.5000 mg | ORAL_TABLET | Freq: Four times a day (QID) | ORAL | Status: DC | PRN
  Administered 2019-07-21: 13:00:00 0.5 mg via ORAL
  Filled 2019-07-21 (×2): qty 1

## 2019-07-21 MED ORDER — VITAMIN D3 25 MCG (1000 UNIT) PO TABS
2000.0000 [IU] | ORAL_TABLET | Freq: Every day | ORAL | Status: DC
  Administered 2019-07-21 – 2019-07-25 (×4): 2000 [IU] via ORAL
  Filled 2019-07-21 (×4): qty 2

## 2019-07-21 MED ORDER — SIMVASTATIN 40 MG PO TABS
40.0000 mg | ORAL_TABLET | Freq: Every day | ORAL | Status: DC
  Administered 2019-07-21: 20:00:00 40 mg via ORAL
  Filled 2019-07-21: qty 1

## 2019-07-21 MED ORDER — OSMOLITE 1.5 CAL PO LIQD
1000.0000 mL | ORAL | Status: DC
  Administered 2019-07-21: 1000 mL
  Filled 2019-07-21: qty 1000

## 2019-07-21 MED ORDER — ONDANSETRON HCL 4 MG/2ML IJ SOLN
4.0000 mg | Freq: Once | INTRAMUSCULAR | Status: AC
  Administered 2019-07-21: 4 mg via INTRAVENOUS
  Filled 2019-07-21: qty 2

## 2019-07-21 MED ORDER — OLANZAPINE 2.5 MG PO TABS
2.5000 mg | ORAL_TABLET | Freq: Every day | ORAL | Status: DC
  Administered 2019-07-21: 2.5 mg via ORAL
  Filled 2019-07-21: qty 1

## 2019-07-21 MED ORDER — ONDANSETRON HCL 4 MG PO TABS
4.0000 mg | ORAL_TABLET | Freq: Four times a day (QID) | ORAL | Status: DC | PRN
  Administered 2019-07-21: 13:00:00 4 mg via ORAL
  Filled 2019-07-21: qty 1

## 2019-07-21 MED ORDER — ACETAMINOPHEN 325 MG PO TABS
650.0000 mg | ORAL_TABLET | Freq: Four times a day (QID) | ORAL | Status: DC | PRN
  Administered 2019-07-21: 13:00:00 650 mg via ORAL
  Filled 2019-07-21: qty 2

## 2019-07-21 MED ORDER — VITAMIN B-12 1000 MCG PO TABS
1000.0000 ug | ORAL_TABLET | Freq: Every day | ORAL | Status: DC
  Administered 2019-07-21: 13:00:00 1000 ug via ORAL
  Filled 2019-07-21: qty 1

## 2019-07-21 MED ORDER — ACETAMINOPHEN 650 MG RE SUPP: 650.0000 mg | Freq: Four times a day (QID) | RECTAL | Status: DC | PRN

## 2019-07-21 MED ORDER — ESCITALOPRAM OXALATE 20 MG PO TABS
20.0000 mg | ORAL_TABLET | Freq: Every day | ORAL | Status: DC
  Administered 2019-07-21: 13:00:00 20 mg via ORAL
  Filled 2019-07-21: qty 1

## 2019-07-21 MED ORDER — TAMSULOSIN HCL 0.4 MG PO CAPS
0.4000 mg | ORAL_CAPSULE | Freq: Every day | ORAL | Status: DC
  Administered 2019-07-21 – 2019-07-25 (×4): 0.4 mg via ORAL
  Filled 2019-07-21 (×4): qty 1

## 2019-07-21 NOTE — ED Notes (Signed)
Patient continues to occasionally gag and vomit clear mucus like emesis.

## 2019-07-21 NOTE — ED Triage Notes (Signed)
Patient BIB [facility] from home, pt started vomiting bright red blood around 1900, x7 per wife and once with EMS. Pt has hx of esophageal tumor and feeding tube. EMS gave 4mg  IV Zofran and approximately 150 ML NS. Pt also has hx of dementia.

## 2019-07-21 NOTE — ED Notes (Signed)
Patients wife, [name] - [phone], updated on pt current status, and will continue to update as often as able.

## 2019-07-21 NOTE — H&P (Addendum)
History and Physical    Cecilia Nishikawa Morency DPO:[id number] DOB: Oct 29, 1945 DOA: 07/21/2019  PCP: Leighton Ruff, MD  Patient coming from: home  I have personally briefly reviewed patient's old medical records in [place]  Chief Complaint: Hematemesis  HPI: Joseh Sjogren Blickenstaff is a 74 y.o. male with medical history significant of dementia, HTN, HLD, OSA, recent diagnosis of esophageal cancer status post PEG tube presenting with new hematemesis.  Patient has dementia which he is aware of and is able to state that he has short-term memory loss so requested that I speak with his wife for better history.  He however does not report any complaints at this time except for his mouth being parched.  He is aware he had vomited up blood but was unaware of how much or how often.  He also recalls that he had dark stools once this past week.  Otherwise I spoke with his wife who reports that the patient last night up until around midnight vomited up bloody emesis about 6 times.  She reports this was new for him.  She wonders if his new protein supplement could have irritated the mucosa leading to this.  She denies any recent use of NSAIDs for the patient.  She states he still takes meds and water by mouth but does all his nutrition via the PEG tube.  She does not report any had any fevers, chills.  She did give a dose of Compazine last night prior to him coming in via EMS. She reports she has had weight loss but per review of chart it appears to be 3 pounds in the last 2 weeks as opposed to the concerned 15 pounds.  She does not recall any melena but states she did give him MiraLAX and perhaps that day he did have darker stools.  She reports he has been lightheaded and dizzy in the past couple weeks.  She reports home health nurse check orthostatic vitals on him and he has been positive and lightheaded when he stands.  Physical therapy has evaluated at home and he was recommended for a 4 wheeled walker.  She  reports that she has been closely monitoring him as he ambulates in case he were to fall but he has not.  She reports he had been planned for radiation therapy to begin tomorrow.  He has not had anything by mouth since right around midnight.   Review of Systems: As per HPI otherwise 10 point review of systems negative.    Past Medical History:  Diagnosis Date  . Anxiety   . BPH (benign prostatic hyperplasia)   . Cancer ([place])   . Dementia ([place])   . Depression   . Hyperlipidemia   . Hypertension   . Kidney stone   . Memory loss   . 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 Sleep apnea    CPAP  . Vitamin D deficiency     Past Surgical History:  Procedure Laterality Date  . CATARACT EXTRACTION, BILATERAL  2018  . EXTERNAL EAR SURGERY     child  . EYE SURGERY Bilateral    cataract  . IR GASTROSTOMY TUBE MOD SED  07/08/2019  . right knee meniscus  06/12/2008  . right talus repair     dislocated     reports that he has never smoked. He has never used smokeless tobacco. He reports current alcohol use of about 1.0 - 2.0 standard drinks of alcohol per week. He reports current drug use.  Drug: Marijuana.  Allergies  Allergen Reactions  . Other     Other reaction(s): Other Sleep walk   . Abilify [Aripiprazole] Other (See Comments)    Short term memory loss  . Lamictal [Lamotrigine]     Increased agitation  . Wellbutrin [Bupropion] Anxiety    Patient does not recall allergy type Anxiety, confusion per Dr Edwin Dada note    Family History  Problem Relation Age of Onset  . Alzheimer's disease Mother   . Depression Mother   . Cancer Father        prostate  . Thyroid disease Father   . Alzheimer's disease Maternal Grandmother   . Heart disease Maternal Grandfather   . Stroke Paternal Grandmother   . Stroke Paternal Grandfather   . Alcoholism Brother   . Autism Daughter   . Autism Maternal Uncle      Prior to Admission medications   Medication Sig Start Date  End Date Taking? Authorizing Provider  Amino Acids-Protein Hydrolys (FEEDING SUPPLEMENT, PRO-STAT SUGAR FREE 64,) LIQD Take 30 mLs by mouth 2 (two) times daily. 07/12/19  Yes Georgette Shell, MD  buPROPion (WELLBUTRIN XL) 150 MG 24 hr tablet TAKE 1 TABLET ONCE DAILY. Patient taking differently: Take 150 mg by mouth daily.  07/18/19  Yes [name], PMHNP  donepezil (ARICEPT) 5 MG tablet TAKE ONE TABLET AT BEDTIME. Patient taking differently: Take 5 mg by mouth at bedtime.  07/18/19  Yes [name], PMHNP  escitalopram (LEXAPRO) 20 MG tablet TAKE 1 TABLET ONCE DAILY. Patient taking differently: Take 20 mg by mouth daily.  07/18/19  Yes [name], PMHNP  HYDROcodone-acetaminophen (NORCO/VICODIN) 5-325 MG tablet Take 1 tablet by mouth every 6 (six) hours as needed for moderate pain.   Yes [provider]  LORazepam (ATIVAN) 0.5 MG tablet Take 1 tablet (0.5 mg total) by mouth every 6 (six) hours as needed for anxiety. 07/12/19  Yes Georgette Shell, MD  memantine (NAMENDA) 10 MG tablet TAKE 1 TABLET BY MOUTH TWICE DAILY. Patient taking differently: Take 10 mg by mouth 2 (two) times daily.  07/18/19  Yes [name], PMHNP  Nutritional Supplements (FEEDING SUPPLEMENT, OSMOLITE 1.5 CAL,) LIQD Osmolite 1.5-- 237 mL 5 times a day flush with 50 mL of free water before and 50 mL of free water after the bolus. Patient taking differently: Place 1,000 mLs into feeding tube See admin instructions. Osmolite 1.5-- continuous from 6p-6a day, bolus at 2pm 279ml  flush with 50 mL of free water before and 50 mL of free water after the bolus. 07/12/19  Yes Georgette Shell, MD  OLANZapine (ZYPREXA) 2.5 MG tablet Take 1 tablet (2.5 mg total) by mouth at bedtime. 07/17/19  Yes [name], PMHNP  prochlorperazine (COMPAZINE) 5 MG tablet Take 1 tablet (5 mg total) by mouth every 6 (six) hours as needed for nausea or vomiting. May take orally or crush and take via tube. 07/16/19  Yes Tanner, Lyndon Code., PA-C  simvastatin (ZOCOR) 40 MG tablet Take 40 mg by mouth daily.   Yes [provider]  Tamsulosin HCl (FLOMAX) 0.4 MG CAPS Take 0.4 mg by mouth daily.    Yes [provider]  vitamin B-12 (CYANOCOBALAMIN) 1000 MCG tablet Take 1,000 mcg by mouth daily.   Yes [provider]  Cholecalciferol (VITAMIN D3 PO) Take 2,000 Units by mouth daily.    [provider]  HYDROcodone-acetaminophen (NORCO/VICODIN) 5-325 MG tablet Take 1-2 tablets by mouth every 4 (four) hours as  needed for moderate pain. Patient not taking: Reported on 07/17/2019 07/12/19   Georgette Shell, MD    Physical Exam: Vitals:   07/21/19 0645 07/21/19 0700 07/21/19 0715 07/21/19 0857  BP: (!) 142/85 (!) 143/91 140/84 (!) 141/93  Pulse: 81 79 78 82  Resp: 13 20 12 20   Temp:    97.7 F (36.5 C)  TempSrc:    Oral  SpO2: 98% 100% 99% 99%  Weight:        Vitals:   07/21/19 0645 07/21/19 0700 07/21/19 0715 07/21/19 0857  BP: (!) 142/85 (!) 143/91 140/84 (!) 141/93  Pulse: 81 79 78 82  Resp: 13 20 12 20   Temp:    97.7 F (36.5 C)  TempSrc:    Oral  SpO2: 98% 100% 99% 99%  Weight:         Constitutional: Pleasant, NAD, calm, comfortable.  Alert and oriented x3 but very slow to respond Eyes: PERRL, lids and conjunctivae normal ENMT: Mucous membranes are moist. Posterior pharynx clear of any exudate or lesions.Normal dentition.  Neck: normal, supple, no masses, no thyromegaly Respiratory: clear to auscultation bilaterally, no wheezing, no crackles. Normal respiratory effort. No accessory muscle use.  Cardiovascular: Regular rate and rhythm, no murmurs / rubs / gallops. No extremity edema. 2+ pedal pulses. No carotid bruits.  Abdomen: PEG tube in place, no tenderness, no masses palpated. No hepatosplenomegaly. Bowel sounds positive.  Musculoskeletal: no clubbing / cyanosis. No joint deformity upper and lower extremities. Good ROM, no contractures. Normal muscle tone.  Skin: no  rashes, lesions, ulcers. No induration Neurologic: CN 2-12 grossly intact.  Moving all extremities Psychiatric: Guarded judgment and insight.     Labs on Admission: I have personally reviewed following labs and imaging studies  CBC: Recent Labs  Lab 07/21/19 0123  WBC 11.7*  NEUTROABS 9.4*  HGB 13.1  HCT 40.6  MCV 90.8  PLT 025   Basic Metabolic Panel: Recent Labs  Lab 07/21/19 0123  NA 139  K 4.1  CL 100  CO2 28  GLUCOSE 122*  BUN 31*  CREATININE 1.41*  CALCIUM 11.3*   GFR: Estimated Creatinine Clearance: 59.7 mL/min (A) (by C-G formula based on SCr of 1.41 mg/dL (H)). Liver Function Tests: Recent Labs  Lab 07/21/19 0123  AST 21  ALT 27  ALKPHOS 60  BILITOT 0.8  PROT 7.0  ALBUMIN 3.8   No results for input(s): LIPASE, AMYLASE in the last 168 hours. No results for input(s): AMMONIA in the last 168 hours. Coagulation Profile: Recent Labs  Lab 07/21/19 0123  INR 1.0   Cardiac Enzymes: No results for input(s): CKTOTAL, CKMB, CKMBINDEX, TROPONINI in the last 168 hours. BNP (last 3 results) No results for input(s): PROBNP in the last 8760 hours. HbA1C: No results for input(s): HGBA1C in the last 72 hours. CBG: No results for input(s): GLUCAP in the last 168 hours. Lipid Profile: No results for input(s): CHOL, HDL, LDLCALC, TRIG, CHOLHDL, LDLDIRECT in the last 72 hours. Thyroid Function Tests: No results for input(s): TSH, T4TOTAL, FREET4, T3FREE, THYROIDAB in the last 72 hours. Anemia Panel: No results for input(s): VITAMINB12, FOLATE, FERRITIN, TIBC, IRON, RETICCTPCT in the last 72 hours. Urine analysis:    Component Value Date/Time   COLORURINE YELLOW 05/06/2019 2159   APPEARANCEUR CLEAR 05/06/2019 2159   LABSPEC 1.027 05/06/2019 2159   PHURINE 5.0 05/06/2019 2159   GLUCOSEU NEGATIVE 05/06/2019 2159   HGBUR NEGATIVE 05/06/2019 2159   BILIRUBINUR NEGATIVE 05/06/2019 2159   Benjamin Stain  5 (A) 05/06/2019 2159   PROTEINUR NEGATIVE 05/06/2019 2159    NITRITE NEGATIVE 05/06/2019 2159   LEUKOCYTESUR NEGATIVE 05/06/2019 2159    Radiological Exams on Admission: No results found.   Assessment/Plan Quindon Denker Saez is a 74 y.o. male with medical history significant of dementia, HTN, HLD, OSA, recent diagnosis of esophageal cancer status post PEG tube presenting with new hematemesis.  #Hematemesis, acute upper GI bleed -Most likely concern would be bleeding at the site of malignancy; EGD on 3/2 revealed ulcerated mass at the lower esophagus -Patient is n.p.o., hemoglobin fortunately remained stable, present emesis is intermittent and nonbloody -Continue IV pantoprazole 40 mg twice daily -Appreciate evaluation and will follow recommendations of gastroenterology, Dr. Therisa Doyne -Maintain peripheral access, monitor H/H, continue IV fluids -Holding meds, once acute issue addressed pending disposition would consult with nutrition, PT and OT  # AKI - suspect pre-renal, anticipate improvement with hydration - monitor I/O - avoid nephrotoxic meds  #Esophageal cancer -Poorly differentiated pathology confirmed at [facility] -Patient follows with Dr. Learta Codding -PET scan reveals nodal involvement above and below diaphragm -Patient was planned for radiation therapy followed by pembrolizumab  #Dementia -On donepezil and memantine as well as Zyprexa at home -Delirium precautions  #HLD -Patient is on simvastatin wife reports had not been taking recently  #Hypertension -Per chart history but recently has been orthostatic and not on any meds  #Mood disorder, anxiety/depression -On escitalopram, bupropion with as needed Ativan at home  #BPH -On tamsulosin at home  DVT prophylaxis: SCDs Code Status: Full Family Communication: Spoke with wife Consults called: Gastroenterology, Dr. Therisa Doyne Admission status: Telemetry  Truddie Hidden MD [facility] Pager [phone]  If 7PM-7AM, please contact night-coverage [email or web address] Password  [facility]  07/21/2019, 9:40 AM

## 2019-07-21 NOTE — ED Provider Notes (Signed)
[facility] DEPT Provider Note   CSN: [id number] Arrival date & time: 07/21/19  0056   Time seen 12:59 AM  History Chief complaint vomiting blood   Level 5 caveat for dementia  Creedon Danielski Berke is a 74 y.o. male.  HPI   Patient is brought to the emergency department via EMS.  They report his wife states he started vomiting blood around 7 PM tonight.  She reports she has vomited about 7 times and has had about 75 cc of bright red blood.  Patient denies any abdominal pain.  He thinks he may have been nauseated.  Patient is noted to have a feeding tube.  EMS states he has a esophageal tumor.  Past Medical History:  Diagnosis Date  . Anxiety   . BPH (benign prostatic hyperplasia)   . Cancer ([place])   . Dementia ([place])   . Depression   . Hyperlipidemia   . Hypertension   . Kidney stone   . Memory loss   . 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 Sleep apnea    CPAP  . Vitamin D deficiency     Patient Active Problem List   Diagnosis Date Noted  . Hematemesis 07/21/2019  . Dysphagia 07/05/2019  . Dementia without behavioral disturbance ([place]) 07/05/2019  . AKI (acute kidney injury) ([place]) 07/05/2019  . Esophageal cancer ([place]) 07/05/2019  . Primary cancer of lower third of esophagus ([place]) 07/04/2019  . 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 Generalized anxiety disorder 10/28/2017  . Dyslipidemia 07/10/2012  . Metabolic syndrome 56/43/3295  . Prostatic hypertrophy 07/10/2012  . Testosterone deficiency 07/10/2012  . Essential hypertension 06/27/2011  . MDD (major depressive disorder), recurrent episode, mild ([place]) 05/16/2011    Past Surgical History:  Procedure Laterality Date  . CATARACT EXTRACTION, BILATERAL  2018  . EXTERNAL EAR SURGERY     child  . EYE SURGERY Bilateral    cataract  . IR GASTROSTOMY TUBE MOD SED  07/08/2019  . right knee meniscus  06/12/2008  . right talus repair     dislocated       Family History  Problem Relation Age of Onset  . Alzheimer's disease Mother   . Depression Mother   . Cancer Father        prostate  . Thyroid disease Father   . Alzheimer's disease Maternal Grandmother   . Heart disease Maternal Grandfather   . Stroke Paternal Grandmother   . Stroke Paternal Grandfather   . Alcoholism Brother   . Autism Daughter   . Autism Maternal Uncle     Social History   Tobacco Use  . Smoking status: Never Smoker  . Smokeless tobacco: Never Used  Substance Use Topics  . Alcohol use: Yes    Alcohol/week: 1.0 - 2.0 standard drinks    Types: 1 - 2 Shots of liquor per week    Comment: 1-2 shots 4-5 x week  . Drug use: Yes    Types: Marijuana    Comment: Occasional  lives at home Lives with spouse  Home Medications Prior to Admission medications   Medication Sig Start Date End Date Taking? Authorizing Provider  Amino Acids-Protein Hydrolys (FEEDING SUPPLEMENT, PRO-STAT SUGAR FREE 64,) LIQD Take 30 mLs by mouth 2 (two) times daily. 07/12/19  Yes Georgette Shell, MD  buPROPion (WELLBUTRIN XL) 150 MG 24 hr tablet TAKE 1 TABLET ONCE DAILY. Patient taking differently: Take 150 mg by mouth daily.  07/18/19  Yes Eulas Post,  Janett Billow, PMHNP  donepezil (ARICEPT) 5 MG tablet TAKE ONE TABLET AT BEDTIME. Patient taking differently: Take 5 mg by mouth at bedtime.  07/18/19  Yes [name], PMHNP  escitalopram (LEXAPRO) 20 MG tablet TAKE 1 TABLET ONCE DAILY. Patient taking differently: Take 20 mg by mouth daily.  07/18/19  Yes [name], PMHNP  HYDROcodone-acetaminophen (NORCO/VICODIN) 5-325 MG tablet Take 1 tablet by mouth every 6 (six) hours as needed for moderate pain.   Yes [provider]  LORazepam (ATIVAN) 0.5 MG tablet Take 1 tablet (0.5 mg total) by mouth every 6 (six) hours as needed for anxiety. 07/12/19  Yes Georgette Shell, MD  memantine (NAMENDA) 10 MG tablet TAKE 1 TABLET BY MOUTH TWICE DAILY. Patient taking  differently: Take 10 mg by mouth 2 (two) times daily.  07/18/19  Yes [name], PMHNP  Nutritional Supplements (FEEDING SUPPLEMENT, OSMOLITE 1.5 CAL,) LIQD Osmolite 1.5-- 237 mL 5 times a day flush with 50 mL of free water before and 50 mL of free water after the bolus. Patient taking differently: Place 1,000 mLs into feeding tube See admin instructions. Osmolite 1.5-- continuous from 6p-6a day, bolus at 2pm 220m  flush with 50 mL of free water before and 50 mL of free water after the bolus. 07/12/19  Yes MGeorgette Shell MD  OLANZapine (ZYPREXA) 2.5 MG tablet Take 1 tablet (2.5 mg total) by mouth at bedtime. 07/17/19  Yes CThayer Headings PMHNP  prochlorperazine (COMPAZINE) 5 MG tablet Take 1 tablet (5 mg total) by mouth every 6 (six) hours as needed for nausea or vomiting. May take orally or crush and take via tube. 07/16/19  Yes Tanner, VLyndon Code, PA-C  simvastatin (ZOCOR) 40 MG tablet Take 40 mg by mouth daily.   Yes [provider]  Tamsulosin HCl (FLOMAX) 0.4 MG CAPS Take 0.4 mg by mouth daily.    Yes [provider]  vitamin B-12 (CYANOCOBALAMIN) 1000 MCG tablet Take 1,000 mcg by mouth daily.   Yes [provider]  Cholecalciferol (VITAMIN D3 PO) Take 2,000 Units by mouth daily.    [provider]  HYDROcodone-acetaminophen (NORCO/VICODIN) 5-325 MG tablet Take 1-2 tablets by mouth every 4 (four) hours as needed for moderate pain. Patient not taking: Reported on 07/17/2019 07/12/19   MGeorgette Shell MD    Allergies    Other, Abilify [aripiprazole], Lamictal [lamotrigine], and Wellbutrin [bupropion]  Review of Systems   Review of Systems  All other systems reviewed and are negative.   Physical Exam Updated Vital Signs BP 126/86   Pulse 79   Temp 98.6 F (37 C) (Oral)   Resp 18   Wt 99.6 kg   SpO2 99%   BMI 28.19 kg/m   Physical Exam Vitals and nursing note reviewed.  Constitutional:      Appearance: Normal appearance. He is normal  weight.  HENT:     Head: Normocephalic and atraumatic.  Eyes:     Extraocular Movements: Extraocular movements intact.     Conjunctiva/sclera: Conjunctivae normal.     Pupils: Pupils are equal, round, and reactive to light.  Cardiovascular:     Rate and Rhythm: Normal rate and regular rhythm.  Pulmonary:     Effort: Pulmonary effort is normal. No respiratory distress.  Abdominal:     General: Abdomen is flat. Bowel sounds are normal.     Palpations: Abdomen is soft.     Tenderness: There is no abdominal tenderness.     Comments: Patient has a feeding  tube without blood in it  Musculoskeletal:        General: Normal range of motion.     Cervical back: Normal range of motion.  Skin:    General: Skin is warm and dry.  Neurological:     General: No focal deficit present.     Mental Status: He is alert. Mental status is at baseline.     Cranial Nerves: No cranial nerve deficit.  Psychiatric:        Mood and Affect: Mood normal.        Behavior: Behavior normal.        Thought Content: Thought content normal.     ED Results / Procedures / Treatments   Labs (all labs ordered are listed, but only abnormal results are displayed) Results for orders placed or performed during the hospital encounter of 07/21/19  Comprehensive metabolic panel  Result Value Ref Range   Sodium 139 135 - 145 mmol/L   Potassium 4.1 3.5 - 5.1 mmol/L   Chloride 100 98 - 111 mmol/L   CO2 28 22 - 32 mmol/L   Glucose, Bld 122 (H) 70 - 99 mg/dL   BUN 31 (H) 8 - 23 mg/dL   Creatinine, Ser 1.41 (H) 0.61 - 1.24 mg/dL   Calcium 11.3 (H) 8.9 - 10.3 mg/dL   Total Protein 7.0 6.5 - 8.1 g/dL   Albumin 3.8 3.5 - 5.0 g/dL   AST 21 15 - 41 U/L   ALT 27 0 - 44 U/L   Alkaline Phosphatase 60 38 - 126 U/L   Total Bilirubin 0.8 0.3 - 1.2 mg/dL   GFR calc non Af Amer 49 (L) >60 mL/min   GFR calc Af Amer 57 (L) >60 mL/min   Anion gap 11 5 - 15  CBC with Differential  Result Value Ref Range   WBC 11.7 (H) 4.0 - 10.5  K/uL   RBC 4.47 4.22 - 5.81 MIL/uL   Hemoglobin 13.1 13.0 - 17.0 g/dL   HCT 40.6 39.0 - 52.0 %   MCV 90.8 80.0 - 100.0 fL   MCH 29.3 26.0 - 34.0 pg   MCHC 32.3 30.0 - 36.0 g/dL   RDW 12.3 11.5 - 15.5 %   Platelets 328 150 - 400 K/uL   nRBC 0.0 0.0 - 0.2 %   Neutrophils Relative % 81 %   Neutro Abs 9.4 (H) 1.7 - 7.7 K/uL   Lymphocytes Relative 11 %   Lymphs Abs 1.3 0.7 - 4.0 K/uL   Monocytes Relative 7 %   Monocytes Absolute 0.9 0.1 - 1.0 K/uL   Eosinophils Relative 0 %   Eosinophils Absolute 0.1 0.0 - 0.5 K/uL   Basophils Relative 0 %   Basophils Absolute 0.0 0.0 - 0.1 K/uL   Immature Granulocytes 1 %   Abs Immature Granulocytes 0.07 0.00 - 0.07 K/uL  Protime-INR  Result Value Ref Range   Prothrombin Time 13.2 11.4 - 15.2 seconds   INR 1.0 0.8 - 1.2  APTT  Result Value Ref Range   aPTT 29 24 - 36 seconds  POC occult blood, ED RN will collect  Result Value Ref Range   Fecal Occult Bld POSITIVE (A) NEGATIVE   Laboratory interpretation all normal except leukocytosis, positive Hemoccult, elevated BUN consistent with GI bleeding, new mild renal insufficiency  EKG None  Radiology No results found.   IR GASTROSTOMY TUBE MOD SED  Result Date: 07/08/2019 INDICATION: 74 year old male with obstructing esophageal mass and resultant dysphagia. IMPRESSION: Successful  placement of a 61 French balloon retention percutaneous gastrostomy tube. Electronically Signed   By: Jacqulynn Cadet M.D.   On: 07/08/2019 16:21   NM PET Image Initial (PI) Skull Base To Thigh  Result Date: 07/05/2019 CLINICAL DATA:  Initial treatment strategy for esophageal cancer.. IMPRESSION: 1. Signs of esophageal cancer with large esophageal mass with nodal involvement both above and below the diaphragm. 2. 7 mm distal left ureteral calculus is unchanged and does not lead to urinary tract obstruction. Electronically Signed   By: Zetta Bills M.D.   On: 07/05/2019 11:55    Procedures Procedures (including  critical care time)  Medications Ordered in ED Medications  pantoprazole (PROTONIX) injection 40 mg (has no administration in time range)  0.9 %  sodium chloride infusion (has no administration in time range)  sodium chloride 0.9 % bolus 1,000 mL (1,000 mLs Intravenous New Bag/Given 07/21/19 0353)    ED Course  I have reviewed the triage vital signs and the nursing notes.  Pertinent labs & imaging results that were available during my care of the patient were reviewed by me and considered in my medical decision making (see chart for details).    MDM Rules/Calculators/A&P                     1. Esophagus cancer ? CT 05/06/2019-circumferential thickening of the distal esophagus, mild splenomegaly, bladder wall thickening, enlarged prostate ? Upper endoscopy 06/11/2019-ulcerated mass at the lower esophagus, 36 cm from the incisors, partially obstructing, biopsy revealed atypical cells-reactive change versus poorly differentiated neoplasm, outside consultation at [facility] on this biopsy and a repeat biopsy 06/21/2019 confirmed a poorly differentiated malignant neoplasm ? HER-2 negative, PD-L1 combined positive score -20 ? Repeat endoscopy 06/21/2019-partially obstructing esophageal tumor found in the lower third of the esophagus ? PET scan 07/05/2019-large esophageal mass with nodal involvement both above and below the diaphragm including a left periaortic node Pt to start radiation tx on April 12   03:30 Ex-wife, [name] Brossart, POA, made aware of his lab tests, she is agreeable with admission and EDG if needed  04:04 AM Dr Myna Hidalgo, hospitalist will admit  04:07 AM Dr Therisa Doyne, GI, will see in am, feels he may need radiation treatment instead of EDG for the bleeding.   Final Clinical Impression(s) / ED Diagnoses Final diagnoses:  Hematemesis, presence of nausea not specified  Malignant neoplasm of lower third of esophagus [facility])    Rx / DC Orders  Plan admission  Rolland Porter, MD, Barbette Or, MD 07/21/19 [phone]

## 2019-07-21 NOTE — ED Notes (Signed)
Attempted to call report at 0659 per time entered by floor. Unable to give report, due to shift change. Charge nurse states they will take report in 10 minutes.

## 2019-07-21 NOTE — Consult Note (Signed)
[facility] Consult  Referring Provider: [facility] Primary Care Physician:  Leighton Ruff, MD Primary Gastroenterologist: Dr.Buccini  Reason for Consultation:  Hematemesis  HPI: David [name] is a 74 y.o. male esophageal cancer(EGD from 06/11/2019 and repeat EGD with biopsy from 06/21/2019 showing partially obstructive esophageal tumor in lower esophagus, PET scan showed large esophageal mass with lymph node involvement both below and above diaphragm including a left periaortic node), presented to the ED with complaint of hematemesis.  Most of the history was obtained from the patient's wife as patient has significant dementia and short-term memory loss. He has a feeding tube and takes most of his fluids, medications and feedings via the tube and only drinks seltzer water from his mouth. As per his wife he had about 6 episodes of bloody emesis yesterday when he presented to the ED.  She is concerned about his weight loss, memory loss, weakness, requiring assistance with walking.  Patient is scheduled for his radiation therapy beginning tomorrow.  Patient has been receiving melena to avoid constipation but has had lightheadedness and dizziness in the past few weeks.   Past Medical History:  Diagnosis Date  . Anxiety   . BPH (benign prostatic hyperplasia)   . Cancer ([place])   . Dementia ([place])   . Depression   . Hyperlipidemia   . Hypertension   . Kidney stone   . Memory loss   . 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 Sleep apnea    CPAP  . Vitamin D deficiency     Past Surgical History:  Procedure Laterality Date  . CATARACT EXTRACTION, BILATERAL  2018  . EXTERNAL EAR SURGERY     child  . EYE SURGERY Bilateral    cataract  . IR GASTROSTOMY TUBE MOD SED  07/08/2019  . right knee meniscus  06/12/2008  . right talus repair     dislocated    Prior to Admission medications   Medication Sig Start Date End Date Taking? Authorizing Provider   Amino Acids-Protein Hydrolys (FEEDING SUPPLEMENT, PRO-STAT SUGAR FREE 64,) LIQD Take 30 mLs by mouth 2 (two) times daily. 07/12/19  Yes Georgette Shell, MD  buPROPion (WELLBUTRIN XL) 150 MG 24 hr tablet TAKE 1 TABLET ONCE DAILY. Patient taking differently: Take 150 mg by mouth daily.  07/18/19  Yes [name], PMHNP  donepezil (ARICEPT) 5 MG tablet TAKE ONE TABLET AT BEDTIME. Patient taking differently: Take 5 mg by mouth at bedtime.  07/18/19  Yes [name], PMHNP  escitalopram (LEXAPRO) 20 MG tablet TAKE 1 TABLET ONCE DAILY. Patient taking differently: Take 20 mg by mouth daily.  07/18/19  Yes [name], PMHNP  HYDROcodone-acetaminophen (NORCO/VICODIN) 5-325 MG tablet Take 1 tablet by mouth every 6 (six) hours as needed for moderate pain.   Yes [provider]  LORazepam (ATIVAN) 0.5 MG tablet Take 1 tablet (0.5 mg total) by mouth every 6 (six) hours as needed for anxiety. 07/12/19  Yes Georgette Shell, MD  memantine (NAMENDA) 10 MG tablet TAKE 1 TABLET BY MOUTH TWICE DAILY. Patient taking differently: Take 10 mg by mouth 2 (two) times daily.  07/18/19  Yes [name], PMHNP  Nutritional Supplements (FEEDING SUPPLEMENT, OSMOLITE 1.5 CAL,) LIQD Osmolite 1.5-- 237 mL 5 times a day flush with 50 mL of free water before and 50 mL of free water after the bolus. Patient taking differently: Place 1,000 mLs into feeding tube See admin instructions. Osmolite 1.5-- continuous from 6p-6a day, bolus at 2pm 230ml  flush  with 50 mL of free water before and 50 mL of free water after the bolus. 07/12/19  Yes Georgette Shell, MD  OLANZapine (ZYPREXA) 2.5 MG tablet Take 1 tablet (2.5 mg total) by mouth at bedtime. 07/17/19  Yes [name], PMHNP  prochlorperazine (COMPAZINE) 5 MG tablet Take 1 tablet (5 mg total) by mouth every 6 (six) hours as needed for nausea or vomiting. May take orally or crush and take via tube. 07/16/19  Yes Tanner, Lyndon Code., PA-C  simvastatin (ZOCOR) 40 MG  tablet Take 40 mg by mouth daily.   Yes [provider]  Tamsulosin HCl (FLOMAX) 0.4 MG CAPS Take 0.4 mg by mouth daily.    Yes [provider]  vitamin B-12 (CYANOCOBALAMIN) 1000 MCG tablet Take 1,000 mcg by mouth daily.   Yes [provider]  Cholecalciferol (VITAMIN D3 PO) Take 2,000 Units by mouth daily.    [provider]  HYDROcodone-acetaminophen (NORCO/VICODIN) 5-325 MG tablet Take 1-2 tablets by mouth every 4 (four) hours as needed for moderate pain. Patient not taking: Reported on 07/17/2019 07/12/19   Georgette Shell, MD    Current Facility-Administered Medications  Medication Dose Route Frequency Provider Last Rate Last Admin  . 0.9 %  sodium chloride infusion   Intravenous Continuous Truddie Hidden, MD 100 mL/hr at 07/21/19 0541 New Bag at 07/21/19 0541  . 0.9 %  sodium chloride infusion   Intravenous Continuous Truddie Hidden, MD      . acetaminophen (TYLENOL) tablet 650 mg  650 mg Oral Q6H PRN Truddie Hidden, MD       Or  . acetaminophen (TYLENOL) suppository 650 mg  650 mg Rectal Q6H PRN Truddie Hidden, MD      . buPROPion (WELLBUTRIN XL) 24 hr tablet 150 mg  150 mg Oral Daily Truddie Hidden, MD      . cholecalciferol (VITAMIN D) tablet 2,000 Units  2,000 Units Oral Daily Truddie Hidden, MD      . donepezil (ARICEPT) tablet 5 mg  5 mg Oral QHS Truddie Hidden, MD      . escitalopram (LEXAPRO) tablet 20 mg  20 mg Oral Daily Truddie Hidden, MD      . feeding supplement (OSMOLITE 1.5 CAL) liquid 1,000 mL  1,000 mL Per Tube See admin instructions Truddie Hidden, MD      . feeding supplement (PRO-STAT SUGAR FREE 64) liquid 30 mL  30 mL Oral BID Truddie Hidden, MD      . HYDROcodone-acetaminophen (NORCO/VICODIN) 5-325 MG per tablet 1 tablet  1 tablet Oral Q6H PRN Truddie Hidden, MD      . LORazepam (ATIVAN) tablet 0.5 mg  0.5 mg Oral Q6H PRN Truddie Hidden, MD      . memantine [facility]) tablet 10 mg  10 mg Oral BID Truddie Hidden, MD      . OLANZapine [facility]) tablet 2.5 mg  2.5 mg Oral QHS Truddie Hidden, MD      . ondansetron [facility]) tablet 4 mg  4 mg Oral Q6H PRN Truddie Hidden, MD       Or  . ondansetron [facility]) injection 4 mg  4 mg Intravenous Q6H PRN Truddie Hidden, MD      . pantoprazole (PROTONIX) injection 40 mg  40 mg Intravenous Q12H Truddie Hidden, MD   40 mg at 07/21/19 0509  . prochlorperazine (COMPAZINE) tablet 5 mg  5 mg Oral Q6H PRN Truddie Hidden, MD      . simvastatin (ZOCOR) tablet 40 mg  40  mg Oral q1800 Truddie Hidden, MD      . tamsulosin [facility]) capsule 0.4 mg  0.4 mg Oral Daily Truddie Hidden, MD      . vitamin B-12 (CYANOCOBALAMIN) tablet 1,000 mcg  1,000 mcg Oral Daily Truddie Hidden, MD        Allergies as of 07/21/2019 - Review Complete 07/21/2019  Allergen Reaction Noted  . Other  10/10/2018  . Abilify [aripiprazole] Other (See Comments) 09/11/2018  . Lamictal [lamotrigine]  10/27/2017  . Wellbutrin [bupropion] Anxiety 10/27/2017    Family History  Problem Relation Age of Onset  . Alzheimer's disease Mother   . Depression Mother   . Cancer Father        prostate  . Thyroid disease Father   . Alzheimer's disease Maternal Grandmother   . Heart disease Maternal Grandfather   . Stroke Paternal Grandmother   . Stroke Paternal Grandfather   . Alcoholism Brother   . Autism Daughter   . Autism Maternal Uncle     Social History   Socioeconomic History  . Marital status: Legally Separated    Spouse name: [name]  . Number of children: 2  . Years of education: 13  . 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 Smoking status: Never Smoker  . Smokeless tobacco: Never Used  Substance and Sexual Activity  . Alcohol use: Yes    Alcohol/week: 1.0 - 2.0 standard drinks    Types: 1 - 2 Shots of liquor per week    Comment: 1-2 shots 4-5 x week  . Drug use: Yes    Types: Marijuana     Comment: Occasional  . Sexual activity: Never  Other Topics Concern  . Not on file  Social History Narrative   Lives alone retired.  Education: BS, MS Phd chemist.  Separated 8 yrs a/o spring 2020.  Adult children son, out of home, and daughter, adult with autistic spectrum, lives with estranged wife.  2 gks.  Mother died at age 40, of COVID, in the [place] outbreak -- emotionally destabilizing event late May/early June.        Cokes 0-3 daily as reported by nursing Sept 2019.  Gets medications via blister pack prepared by pharmacy a/o spring 2020, with generally good compliance but some mixing up of times and dates.   Social Determinants of Health   Financial Resource Strain:   . Difficulty of Paying Living Expenses:   Food Insecurity:   . Worried About Charity fundraiser in the Last Year:   . Arboriculturist in the Last Year:   Transportation Needs:   . Film/video editor (Medical):   Marland Kitchen Lack of Transportation (Non-Medical):   Physical Activity:   . Days of Exercise per Week:   . Minutes of Exercise per Session:   Stress:   . Feeling of Stress :   Social Connections:   . Frequency of Communication with Friends and Family:   . Frequency of Social Gatherings with Friends and Family:   . Attends Religious Services:   . Active Member of Clubs or Organizations:   . Attends Archivist Meetings:   Marland Kitchen Marital Status:   Intimate Partner Violence:   . Fear of Current or Ex-Partner:   . Emotionally Abused:   Marland Kitchen Physically Abused:   . Sexually Abused:     Review of Systems:  Described in detail in HPI.  Physical Exam: Vital signs in last 24 hours: Temp:  [97.7 F (36.5 C)-98.6 F (37 C)] 97.7 F (36.5 C) (04/11 0857) Pulse Rate:  [74-279] 82 (04/11 0857) Resp:  [11-22] 20 (04/11 0857) BP: (126-154)/(82-109) 141/93 (04/11 0857) SpO2:  [96 %-100 %] 99 % (04/11 0857) Weight:  [99.6 kg] 99.6 kg (04/11 0125)    General:   Very emotional and upset about memory  loss, well-developed, well-nourished Head:  Normocephalic and atraumatic. Eyes:  Sclera clear, no icterus.   Conjunctiva pink. Ears:  Normal auditory acuity. Nose:  No deformity, discharge,  or lesions. Mouth:  No deformity or lesions.  Oropharynx pink & moist. Neck:  Supple; no masses or thyromegaly. Lungs:  Clear throughout to auscultation.   No wheezes, crackles, or rhonchi. No acute distress. Heart:  Regular rate and rhythm; no murmurs, clicks, rubs,  or gallops. Extremities:  Without clubbing or edema. Neurologic:  Alert and  oriented x4;  grossly normal neurologically. Skin:  Intact without significant lesions or rashes. Psych:  Alert and cooperative. Normal mood and affect. Abdomen: 16 French feeding tube in place , soft, nontender and nondistended. No masses, hepatosplenomegaly or hernias noted. Normal bowel sounds, without guarding, and without rebound.         Lab Results: Recent Labs    07/21/19 0123 07/21/19 0945  WBC 11.7* 11.9*  HGB 13.1 13.0  HCT 40.6 39.3  PLT 328 317   BMET Recent Labs    07/21/19 0123  NA 139  K 4.1  CL 100  CO2 28  GLUCOSE 122*  BUN 31*  CREATININE 1.41*  CALCIUM 11.3*   LFT Recent Labs    07/21/19 0123  PROT 7.0  ALBUMIN 3.8  AST 21  ALT 27  ALKPHOS 60  BILITOT 0.8   PT/INR Recent Labs    07/21/19 0123  LABPROT 13.2  INR 1.0   MD supposed to be scheduled for his radiation therapy tomorrow follow primary Studies/Results: No results found.  Impression: Esophageal cancer with hematemesis EGD reviewed from 06/11/2019 and 06/21/2019 which showed a large fungating ulcerated mass  Hemoglobin has remained stable at 13.1/13 Slightly elevated BUN/creatinine ratio of 31/1.41 compatible with hematemesis   Plan: Patient is scheduled for palliative radiation tomorrow. Plan is to start him on single agent pembrolizumab at completion of radiation. He may have further episodes of hematemesis as the esophageal mass is large,  ulcerated, will start him on PPI twice daily. Okay to take ice chips and sips of water. Okay to use feeding tube today but to be kept n.p.o. post midnight as patient has radiation scheduled tomorrow with Dr. Lisbeth Renshaw tomorrow.  No plans for endoscopic intervention.     LOS: 0 days   Ronnette Juniper, MD  07/21/2019, 11:53 AM

## 2019-07-22 ENCOUNTER — Ambulatory Visit: Payer: Medicare Other

## 2019-07-22 ENCOUNTER — Ambulatory Visit
Admission: RE | Admit: 2019-07-22 | Discharge: 2019-07-22 | Disposition: A | Discharge status: Home / Self Care | Payer: Medicare Other | Source: Ambulatory Visit | Attending: Radiation Oncology | Admitting: Radiation Oncology

## 2019-07-22 ENCOUNTER — Inpatient Hospital Stay: Payer: Medicare Other | Admitting: Nutrition

## 2019-07-22 ENCOUNTER — Ambulatory Visit (HOSPITAL_COMMUNITY): Payer: Medicare Other

## 2019-07-22 DIAGNOSIS — K920 Hematemesis: Secondary | ICD-10-CM | POA: Diagnosis not present

## 2019-07-22 LAB — BASIC METABOLIC PANEL
Anion gap: 8 (ref 5–15)
BUN: 30 mg/dL — ABNORMAL HIGH (ref 8–23)
CO2: 27 mmol/L (ref 22–32)
Calcium: 10.2 mg/dL (ref 8.9–10.3)
Chloride: 106 mmol/L (ref 98–111)
Creatinine, Ser: 1.45 mg/dL — ABNORMAL HIGH (ref 0.61–1.24)
GFR calc Af Amer: 55 mL/min — ABNORMAL LOW (ref >60)
GFR calc non Af Amer: 48 mL/min — ABNORMAL LOW (ref >60)
Glucose, Bld: 120 mg/dL — ABNORMAL HIGH (ref 70–99)
Potassium: 4.2 mmol/L (ref 3.5–5.1)
Sodium: 141 mmol/L (ref 135–145)

## 2019-07-22 LAB — CBC
HCT: 36 % — ABNORMAL LOW (ref 39.0–52.0)
Hemoglobin: 11.2 g/dL — ABNORMAL LOW (ref 13.0–17.0)
MCH: 29.9 pg (ref 26.0–34.0)
MCHC: 31.1 g/dL (ref 30.0–36.0)
MCV: 96 fL (ref 80.0–100.0)
Platelets: 268 10*3/uL (ref 150–400)
RBC: 3.75 MIL/uL — ABNORMAL LOW (ref 4.22–5.81)
RDW: 12.8 % (ref 11.5–15.5)
WBC: 9.4 10*3/uL (ref 4.0–10.5)
nRBC: 0 % (ref 0.0–0.2)

## 2019-07-22 LAB — GLUCOSE, CAPILLARY: Glucose-Capillary: 95 mg/dL (ref 70–99)

## 2019-07-22 MED ORDER — MEMANTINE HCL 10 MG PO TABS
10.0000 mg | ORAL_TABLET | Freq: Two times a day (BID) | ORAL | Status: DC
  Administered 2019-07-22 – 2019-07-25 (×6): 10 mg
  Filled 2019-07-22 (×6): qty 1

## 2019-07-22 MED ORDER — ESCITALOPRAM OXALATE 20 MG PO TABS
20.0000 mg | ORAL_TABLET | Freq: Every day | ORAL | Status: DC
  Administered 2019-07-23 – 2019-07-25 (×3): 20 mg
  Filled 2019-07-22 (×3): qty 1

## 2019-07-22 MED ORDER — PROCHLORPERAZINE MALEATE 10 MG PO TABS: 5.0000 mg | ORAL_TABLET | Freq: Four times a day (QID) | ORAL | Status: DC | PRN

## 2019-07-22 MED ORDER — DONEPEZIL HCL 5 MG PO TABS
5.0000 mg | ORAL_TABLET | Freq: Every day | ORAL | Status: DC
  Administered 2019-07-22 – 2019-07-24 (×3): 5 mg
  Filled 2019-07-22 (×3): qty 1

## 2019-07-22 MED ORDER — HYDROCODONE-ACETAMINOPHEN 5-325 MG PO TABS: 1.0000 | ORAL_TABLET | Freq: Four times a day (QID) | ORAL | Status: DC | PRN

## 2019-07-22 MED ORDER — ONDANSETRON HCL 4 MG/2ML IJ SOLN
4.0000 mg | Freq: Four times a day (QID) | INTRAMUSCULAR | Status: DC | PRN
  Administered 2019-07-22: 15:00:00 4 mg via INTRAVENOUS
  Filled 2019-07-22: qty 2

## 2019-07-22 MED ORDER — KATE FARMS STANDARD 1.4 PO LIQD
960.0000 mL | ORAL | Status: DC
  Administered 2019-07-22 – 2019-07-24 (×3): 960 mL
  Filled 2019-07-22 (×4): qty 975

## 2019-07-22 MED ORDER — ONDANSETRON HCL 4 MG PO TABS: 4.0000 mg | ORAL_TABLET | Freq: Four times a day (QID) | ORAL | Status: DC | PRN

## 2019-07-22 MED ORDER — LORAZEPAM 0.5 MG PO TABS
0.5000 mg | ORAL_TABLET | Freq: Four times a day (QID) | ORAL | Status: DC | PRN
  Administered 2019-07-24 (×2): 0.5 mg
  Filled 2019-07-22 (×2): qty 1

## 2019-07-22 MED ORDER — KATE FARMS STANDARD 1.4 PO LIQD
325.0000 mL | ORAL | Status: DC
  Administered 2019-07-22 – 2019-07-25 (×4): 325 mL
  Filled 2019-07-22 (×4): qty 325

## 2019-07-22 MED ORDER — FREE WATER
100.0000 mL | Status: DC
  Administered 2019-07-22 – 2019-07-25 (×17): 100 mL

## 2019-07-22 MED ORDER — ACETAMINOPHEN 160 MG/5ML PO SOLN
650.0000 mg | Freq: Four times a day (QID) | ORAL | Status: DC | PRN
  Administered 2019-07-22: 17:00:00 650 mg via JEJUNOSTOMY
  Filled 2019-07-22: qty 20.3

## 2019-07-22 MED ORDER — OLANZAPINE 2.5 MG PO TABS
2.5000 mg | ORAL_TABLET | Freq: Every day | ORAL | Status: DC
  Administered 2019-07-22 – 2019-07-24 (×3): 2.5 mg
  Filled 2019-07-22 (×4): qty 1

## 2019-07-22 MED ORDER — VITAMIN B-12 1000 MCG PO TABS
1000.0000 ug | ORAL_TABLET | Freq: Every day | ORAL | Status: DC
  Administered 2019-07-23 – 2019-07-25 (×3): 1000 ug
  Filled 2019-07-22 (×3): qty 1

## 2019-07-22 MED ORDER — SIMVASTATIN 40 MG PO TABS
40.0000 mg | ORAL_TABLET | Freq: Every day | ORAL | Status: DC
  Administered 2019-07-23 – 2019-07-24 (×2): 40 mg
  Filled 2019-07-22 (×3): qty 1

## 2019-07-22 MED ORDER — ACETAMINOPHEN 650 MG RE SUPP: 650.0000 mg | Freq: Four times a day (QID) | RECTAL | Status: DC | PRN

## 2019-07-22 NOTE — Progress Notes (Signed)
Initial Nutrition Assessment  DOCUMENTATION CODES:   Not applicable  INTERVENTION:  - will order 325 ml bolus of [employer] 1.4 once/day in addition to [employer] 1.4 at 80 ml/hr x12 hours/day (1830-0630) with 100 ml free water every 4 hours.  - this regimen will provide a total of 1809 kcal, 79 grams protein, and 1536 ml free water.   NUTRITION DIAGNOSIS:   Increased nutrient needs related to cancer and cancer related treatments as evidenced by estimated needs.  GOAL:   Patient will meet greater than or equal to 90% of their needs  MONITOR:   TF tolerance, Labs, Weight trends  REASON FOR ASSESSMENT:   Malnutrition Screening Tool, Consult Enteral/tube feeding initiation and management  ASSESSMENT:   74 y.o. male with medical history of dementia, HTN, HLD, OSA, and recent diagnosis of esophageal cancer s/p PEG placement. He presented to the ED due to hematemesis.  Patient has dementia which he is aware of and is able to state that he has short-term memory loss. He is aware he had vomited up blood but was unaware of how much or how often. He also recalls that he had dark stools once this past week. His wife reported 6 episodes of bloody emesis the night PTA. Wife reported he still takes meds and water PO but all nutrition is via PEG. He has been lightheaded and dizzy in the couple of weeks PTA. It had been planned for XRT to begin on 4/12.  Patient has been NPO since admission. Patient with hx of dementia. He is able to converse and provide some information, but is unable to provide information related to home TF regimen.  Able to talk with patient's wife at bedside. Patient is being followed by RD with [facility] and RD at the [facility]. PTA, he was receiving 237 ml bolus of Osmolite 1.5 once/day and Osmolite 1.5 @ 80 ml/hr x12 hours/day. This regimen was providing 1795 kcal, 75 grams protein. He was also ordered ProMod BID (additional 200 kcal and 20 grams protein/day).    Patient's wife is interested in TF formula and protein modular/supplement that is free of additives and corn-based products.   We talked extensively about [employer] 1.4 and wife is researching protein modulars/supplements that are in line with her requests. Informed wife that she is able to bring in the protein supplement she would like patient to receive and an order can be placed so that this home item can be given inpatient. We do not currently have a protein supplement on formulary that meets her requests.   Will monitor tolerance of TF regimen outlined above. If patient tolerates TF at 80 ml/hr, will consider increasing rate in order to better meet estimated nutrition needs.   Per chart review, weight yesterday was 217 lb and weight on 3/25 was 222 lb. This indicates 5 lb weight loss (2.2% body weight) in the past 2.5 weeks; not significant for time frame. Suspect this is, at least in part, due to fluid losses given current high rate IV fluid order.   GI had been consulted this admission, but signed off earlier this afternoon as hematemesis has resolved.    Labs reviewed; BUN: 30 mg/dl, creatinine: 1.45 mg/dl, GFR: 48 ml/min. Medications reviewed; 2000 units vitamin D/day, 40 mg IV protonix BID, 1000 mcg cyanocobalamin per tube/day.  IVF; NS @ 100 ml/hr (2400 ml/day).    NUTRITION - FOCUSED PHYSICAL EXAM:  patient declined at this time; will attempt at follow-up.  Diet Order:  Diet Order            Diet NPO time specified  Diet effective midnight              EDUCATION NEEDS:   Not appropriate for education at this time  Skin:  Skin Assessment: Reviewed RN Assessment  Last BM:  4/1  Height:   Ht Readings from Last 1 Encounters:  07/21/19 6\' 2"  (1.88 m)    Weight:   Wt Readings from Last 1 Encounters:  07/21/19 98.2 kg    Ideal Body Weight:  86.4 kg  BMI:  Body mass index is 27.81 kg/m.  Estimated Nutritional Needs:   Kcal:  2300-2500  kcal  Protein:  115-130 grams  Fluid:  >/= 2.5 L/day     Jarome Matin, MS, RD, LDN, CNSC Inpatient Clinical Dietitian RD pager # available in [facility]  After hours/weekend pager # available in [facility]

## 2019-07-22 NOTE — Progress Notes (Signed)
Patient ID: David [name], male   DOB: April 22, 1945, 74 y.o.   MRN: [id number] Patient's remaining T TAC suture was removed at gastrostomy tube site without immediate complications.  Sutures which are currently on outer disc should remain intact unless causing pain for the patient (per [place]).

## 2019-07-22 NOTE — Progress Notes (Signed)
PROGRESS NOTE    David [name]  IWL:[id number] DOB: 23-May-1945 DOA: 07/21/2019 PCP: Leighton Ruff, MD   Brief Narrative:  David [name] is a 74 y.o. male with medical history significant of dementia, HTN, HLD, OSA, recent diagnosis of esophageal cancer status post PEG tube presenting with new hematemesis.  Patient has dementia which he is aware of and is able to state that he has short-term memory loss so requested that I speak with his wife for better history.  He however does not report any complaints at this time except for his mouth being parched.  He is aware he had vomited up blood but was unaware of how much or how often.  He also recalls that he had dark stools once this past week.  Otherwise I spoke with his wife who reports that the patient last night up until around midnight vomited up bloody emesis about 6 times.  She reports this was new for him.  She wonders if his new protein supplement could have irritated the mucosa leading to this.  She denies any recent use of NSAIDs for the patient.  She states he still takes meds and water by mouth but does all his nutrition via the PEG tube.  She does not report any had any fevers, chills.  She did give a dose of Compazine last night prior to him coming in via EMS. She reports she has had weight loss but per review of chart it appears to be 3 pounds in the last 2 weeks as opposed to the concerned 15 pounds.  She does not recall any melena but states she did give him MiraLAX and perhaps that day he did have darker stools.  She reports he has been lightheaded and dizzy in the past couple weeks.  She reports home health nurse check orthostatic vitals on him and he has been positive and lightheaded when he stands.  Physical therapy has evaluated at home and he was recommended for a 4 wheeled walker.  She reports that she has been closely monitoring him as he ambulates in case he were to fall but he has not.  She reports he had been planned for  radiation therapy to begin tomorrow.  He has not had anything by mouth since right around midnight.   Assessment & Plan:   Active Problems:   Hematemesis  Hematemesis, acute upper GI bleed in setting of esophageal malignancy -Most likely concern would be bleeding at the site of malignancy; EGD on 3/2 revealed ulcerated mass at the lower esophagus -Patient is n.p.o., hemoglobin fortunately remained stable, present emesis is intermittent and nonbloody -Continue IV pantoprazole 40 mg twice daily -GI evaluated, and no current indication for endoscopy, signing off follow-up outpatient as necessary -Maintain peripheral access, monitor H/H, continue IV fluids -Resume home PEG tube feeds and medication.    AKI, likely prerenal, improving -Continue IV fluids, free water flushes with PEG tube feeds and patient apparently tolerating clear liquids by mouth without incident in the past. -Continue to follow with morning labs  Esophageal cancer -Poorly differentiated pathology confirmed at [facility] -Patient follows with Dr. Learta Codding -PET scan reveals nodal involvement above and below diaphragm -Tentatively going for first episode of radiation therapy today on 07/22/2019 to be followed by pembrolizumab per oncology's documentation  Dementia, unclear baseline Rule out acute metabolic encephalopathy, polypharmacy -On donepezil and memantine as well as Zyprexa at home -Delirium precautions in place  HLD -Patient is on simvastatin wife reports had not been taking  recently  Hypertension -Per chart history but recently has been orthostatic and not currently on any meds  Mood disorder, anxiety/depression -On escitalopram, bupropion with as needed Ativan at home  BPH -On tamsulosin at home  DVT prophylaxis: SCDs Code Status: Full Family Communication: Spoke with wife Consults called: Gastroenterology, Dr. Therisa Doyne Admission status:  Inpatient, continues to require IV fluids, close  monitoring given concern for ongoing worsening anemia in the setting of active GI bleed. Disposition Plan: Pending further clinical evaluation and work-up, if patient's hemoglobin remained stable, tolerates radiation well and can tolerate PEG tube feeds and free water flushes with improvement in patient's creatinine would consider discharge home   Consultants:   Radiology oncology, GI Dr. Therisa Doyne, oncology Dr. Benay Spice.  Subjective: No acute issues or events overnight, review of systems somewhat limited given patient's mental status but he declines any current shortness of breath, nausea, vomiting, diarrhea, constipation, headache, fevers, chills.  Objective: Vitals:   07/21/19 1312 07/21/19 1429 07/21/19 2156 07/22/19 0415  BP:  128/72 128/77 (!) 153/81  Pulse:  86 80 73  Resp:  20 16 17   Temp:  97.9 F (36.6 C) 98 F (36.7 C) 97.7 F (36.5 C)  TempSrc:  Oral Oral Oral  SpO2:  97% 99% 99%  Weight: 98.2 kg     Height: 6\' 2"  (1.88 m)       Intake/Output Summary (Last 24 hours) at 07/22/2019 0736 Last data filed at 07/22/2019 0253 Gross per 24 hour  Intake --  Output 525 ml  Net -525 ml   Filed Weights   07/21/19 0125 07/21/19 1312  Weight: 99.6 kg 98.2 kg    Examination: Constitutional: Pleasant, NAD, calm, comfortable.  Alert and oriented x3 but very slow to respond Eyes: PERRL, lids and conjunctivae normal ENMT: Mucous membranes are moist. Posterior pharynx clear of any exudate or lesions.Normal dentition.  Neck: normal, supple, no masses, no thyromegaly Respiratory: clear to auscultation bilaterally, no wheezing, no crackles. Normal respiratory effort. No accessory muscle use.  Cardiovascular: Regular rate and rhythm, no murmurs / rubs / gallops. No extremity edema. 2+ pedal pulses. No carotid bruits.  Abdomen: PEG tube in place, no tenderness, no masses palpated. No hepatosplenomegaly. Bowel sounds positive.  Musculoskeletal: no clubbing / cyanosis. No joint deformity  upper and lower extremities. Good ROM, no contractures. Normal muscle tone.  Skin: no rashes, lesions, ulcers. No induration Neurologic: CN 2-12 grossly intact.  Moving all extremities   Data Reviewed: I have personally reviewed following labs and imaging studies  CBC: Recent Labs  Lab 07/21/19 0123 07/21/19 0945 07/21/19 1701 07/22/19 0211  WBC 11.7* 11.9* 10.9* 9.4  NEUTROABS 9.4*  --   --   --   HGB 13.1 13.0 12.0* 11.2*  HCT 40.6 39.3 37.6* 36.0*  MCV 90.8 89.9 92.2 96.0  PLT 328 317 283 427   Basic Metabolic Panel: Recent Labs  Lab 07/21/19 0123 07/22/19 0211  NA 139 141  K 4.1 4.2  CL 100 106  CO2 28 27  GLUCOSE 122* 120*  BUN 31* 30*  CREATININE 1.41* 1.45*  CALCIUM 11.3* 10.2   GFR: Estimated Creatinine Clearance: 53.5 mL/min (A) (by C-G formula based on SCr of 1.45 mg/dL (H)). Liver Function Tests: Recent Labs  Lab 07/21/19 0123  AST 21  ALT 27  ALKPHOS 60  BILITOT 0.8  PROT 7.0  ALBUMIN 3.8   No results for input(s): LIPASE, AMYLASE in the last 168 hours. No results for input(s): AMMONIA in the last  168 hours. Coagulation Profile: Recent Labs  Lab 07/21/19 0123  INR 1.0   Cardiac Enzymes: No results for input(s): CKTOTAL, CKMB, CKMBINDEX, TROPONINI in the last 168 hours. BNP (last 3 results) No results for input(s): PROBNP in the last 8760 hours. HbA1C: No results for input(s): HGBA1C in the last 72 hours. CBG: No results for input(s): GLUCAP in the last 168 hours. Lipid Profile: No results for input(s): CHOL, HDL, LDLCALC, TRIG, CHOLHDL, LDLDIRECT in the last 72 hours. Thyroid Function Tests: No results for input(s): TSH, T4TOTAL, FREET4, T3FREE, THYROIDAB in the last 72 hours. Anemia Panel: No results for input(s): VITAMINB12, FOLATE, FERRITIN, TIBC, IRON, RETICCTPCT in the last 72 hours. Sepsis Labs: No results for input(s): PROCALCITON, LATICACIDVEN in the last 168 hours.  Recent Results (from the past 240 hour(s))  Respiratory  Panel by RT PCR (Flu A&B, Covid) - Nasopharyngeal Swab     Status: None   Collection Time: 07/21/19  4:07 AM   Specimen: Nasopharyngeal Swab  Result Value Ref Range Status   SARS Coronavirus 2 by RT PCR NEGATIVE NEGATIVE Final    Comment: (NOTE) SARS-CoV-2 target nucleic acids are NOT DETECTED. The SARS-CoV-2 RNA is generally detectable in upper respiratoy specimens during the acute phase of infection. The lowest concentration of SARS-CoV-2 viral copies this assay can detect is 131 copies/mL. A negative result does not preclude SARS-Cov-2 infection and should not be used as the sole basis for treatment or other patient management decisions. A negative result may occur with  improper specimen collection/handling, submission of specimen other than nasopharyngeal swab, presence of viral mutation(s) within the areas targeted by this assay, and inadequate number of viral copies (<131 copies/mL). A negative result must be combined with clinical observations, patient history, and epidemiological information. The expected result is Negative. Fact Sheet for Patients:  PinkCheek.be Fact Sheet for Healthcare Providers:  GravelBags.it This test is not yet ap proved or cleared by the [place] FDA and  has been authorized for detection and/or diagnosis of SARS-CoV-2 by FDA under an Emergency Use Authorization (EUA). This EUA will remain  in effect (meaning this test can be used) for the duration of the COVID-19 declaration under Section 564(b)(1) of the Act, 21 U.S.C. section 360bbb-3(b)(1), unless the authorization is terminated or revoked sooner.    Influenza A by PCR NEGATIVE NEGATIVE Final   Influenza B by PCR NEGATIVE NEGATIVE Final    Comment: (NOTE) The Xpert Xpress SARS-CoV-2/FLU/RSV assay is intended as an aid in  the diagnosis of influenza from Nasopharyngeal swab specimens and  should not be used as a sole basis for  treatment. Nasal washings and  aspirates are unacceptable for Xpert Xpress SARS-CoV-2/FLU/RSV  testing. Fact Sheet for Patients: PinkCheek.be Fact Sheet for Healthcare Providers: GravelBags.it This test is not yet approved or cleared by the [place] FDA and  has been authorized for detection and/or diagnosis of SARS-CoV-2 by  FDA under an Emergency Use Authorization (EUA). This EUA will remain  in effect (meaning this test can be used) for the duration of the  Covid-19 declaration under Section 564(b)(1) of the Act, 21  U.S.C. section 360bbb-3(b)(1), unless the authorization is  terminated or revoked. Performed at [facility], [place] [address] [place], [place] [address]      Radiology Studies: No results found.  Scheduled Meds: . buPROPion  150 mg Oral Daily  . cholecalciferol  2,000 Units Oral Daily  . donepezil  5 mg Oral QHS  . escitalopram  20  mg Oral Daily  . feeding supplement (OSMOLITE 1.5 CAL)  1,000 mL Per Tube See admin instructions  . feeding supplement (PRO-STAT SUGAR FREE 64)  30 mL Oral BID  . memantine  10 mg Oral BID  . OLANZapine  2.5 mg Oral QHS  . pantoprazole (PROTONIX) IV  40 mg Intravenous Q12H  . simvastatin  40 mg Oral q1800  . tamsulosin  0.4 mg Oral Daily  . vitamin B-12  1,000 mcg Oral Daily   Continuous Infusions: . sodium chloride 100 mL/hr at 07/22/19 0153     LOS: 1 day   Time spent: 36min  Trenika Hudson C Layonna Dobie, DO [facility]  If 7PM-7AM, please contact night-coverage [email or web address]  07/22/2019, 7:36 AM

## 2019-07-22 NOTE — Progress Notes (Signed)
[name] Progress Note  David [name] 74 y.o. 05-24-1945 Patient with history of esophageal cancer (diagnosed per EGD in March 2021) presenting with hematemesis.            CC:  Hematemesis, resolved Esophageal cancer   Subjective: Patient states he is feeling fine this morning.  He has no complaints.  He has not had any further episodes of hematemesis.  He has not seen any melenic or bloody stools.  He denies any abdominal pain or chest pain.  ROS : Review of Systems  Cardiovascular: Negative for chest pain and palpitations.  Gastrointestinal: Negative for abdominal pain, blood in stool, constipation, diarrhea, melena, nausea and vomiting.    Objective: Vital signs in last 24 hours: Vitals:   07/21/19 2156 07/22/19 0415  BP: 128/77 (!) 153/81  Pulse: 80 73  Resp: 16 17  Temp: 98 F (36.7 C) 97.7 F (36.5 C)  SpO2: 99% 99%    Physical Exam: Physical Exam  Constitutional: He is oriented to person, place, and time. He appears lethargic. No distress.  Cardiovascular: Normal rate, regular rhythm and normal heart sounds.  Pulmonary/Chest: Effort normal and breath sounds normal. No respiratory distress.  Abdominal: Soft. Bowel sounds are normal. He exhibits no distension and no mass. There is no abdominal tenderness. There is no rebound and no guarding.  Neurological: He is oriented to person, place, and time. He appears lethargic.   Lab Results: Recent Labs    07/21/19 0123 07/22/19 0211  NA 139 141  K 4.1 4.2  CL 100 106  CO2 28 27  GLUCOSE 122* 120*  BUN 31* 30*  CREATININE 1.41* 1.45*  CALCIUM 11.3* 10.2   Recent Labs    07/21/19 0123  AST 21  ALT 27  ALKPHOS 60  BILITOT 0.8  PROT 7.0  ALBUMIN 3.8   Recent Labs    07/21/19 0123 07/21/19 0945 07/21/19 1701 07/22/19 0211  WBC 11.7*   < > 10.9* 9.4  NEUTROABS 9.4*  --   --   --   HGB 13.1   < > 12.0* 11.2*  HCT 40.6   < > 37.6* 36.0*  MCV 90.8   < > 92.2 96.0  PLT 328   < > 283 268    < > = values in this interval not displayed.   Recent Labs    07/21/19 0123  LABPROT 13.2  INR 1.0    Assessment: Esophageal cancer with episode of hematemesis.  Hematemesis now resolved and was likely secondary to esophageal mass ulceration.  Plan: Continue Protonix twice daily.  Radiation scheduled today followed by plan to start pembrolizumab after radiation.  [facility] will sign off.  Please contact us if we can be of any further assistance during this hospital stay.  Salley Slaughter PA-C 07/22/2019, 12:12 PM  Contact #  [phone]

## 2019-07-23 ENCOUNTER — Ambulatory Visit: Payer: Medicare Other

## 2019-07-23 ENCOUNTER — Ambulatory Visit
Admission: RE | Admit: 2019-07-23 | Discharge: 2019-07-23 | Disposition: A | Discharge status: Home / Self Care | Payer: Medicare Other | Source: Ambulatory Visit | Attending: Radiation Oncology | Admitting: Radiation Oncology

## 2019-07-23 ENCOUNTER — Other Ambulatory Visit: Payer: Self-pay

## 2019-07-23 DIAGNOSIS — C155 Malignant neoplasm of lower third of esophagus: Secondary | ICD-10-CM | POA: Diagnosis not present

## 2019-07-23 DIAGNOSIS — K920 Hematemesis: Secondary | ICD-10-CM | POA: Diagnosis not present

## 2019-07-23 LAB — CBC
HCT: 33.5 % — ABNORMAL LOW (ref 39.0–52.0)
Hemoglobin: 10.7 g/dL — ABNORMAL LOW (ref 13.0–17.0)
MCH: 29.6 pg (ref 26.0–34.0)
MCHC: 31.9 g/dL (ref 30.0–36.0)
MCV: 92.5 fL (ref 80.0–100.0)
Platelets: 260 10*3/uL (ref 150–400)
RBC: 3.62 MIL/uL — ABNORMAL LOW (ref 4.22–5.81)
RDW: 12.5 % (ref 11.5–15.5)
WBC: 8.5 10*3/uL (ref 4.0–10.5)
nRBC: 0 % (ref 0.0–0.2)

## 2019-07-23 LAB — COMPREHENSIVE METABOLIC PANEL
ALT: 18 U/L (ref 0–44)
AST: 16 U/L (ref 15–41)
Albumin: 2.9 g/dL — ABNORMAL LOW (ref 3.5–5.0)
Alkaline Phosphatase: 54 U/L (ref 38–126)
Anion gap: 9 (ref 5–15)
BUN: 23 mg/dL (ref 8–23)
CO2: 24 mmol/L (ref 22–32)
Calcium: 9.4 mg/dL (ref 8.9–10.3)
Chloride: 108 mmol/L (ref 98–111)
Creatinine, Ser: 1.27 mg/dL — ABNORMAL HIGH (ref 0.61–1.24)
GFR calc Af Amer: >60 mL/min (ref >60)
GFR calc non Af Amer: 56 mL/min — ABNORMAL LOW (ref >60)
Glucose, Bld: 138 mg/dL — ABNORMAL HIGH (ref 70–99)
Potassium: 3.9 mmol/L (ref 3.5–5.1)
Sodium: 141 mmol/L (ref 135–145)
Total Bilirubin: 0.7 mg/dL (ref 0.3–1.2)
Total Protein: 5.6 g/dL — ABNORMAL LOW (ref 6.5–8.1)

## 2019-07-23 NOTE — Progress Notes (Addendum)
HEMATOLOGY-ONCOLOGY PROGRESS NOTE  SUBJECTIVE: The patient reports that he feels "sleepy" this morning. Denies any other specific complaints. Nursing has not noticed any bleeding.   PHYSICAL EXAMINATION:  Vitals:   07/22/19 2020 07/23/19 0549  BP: 120/64 122/72  Pulse: 75 71  Resp: 18 18  Temp: 98.2 F (36.8 C) 98.5 F (36.9 C)  SpO2: 99% 97%   Filed Weights   07/21/19 0125 07/21/19 1312  Weight: 99.6 kg 98.2 kg    Intake/Output from previous day: 04/12 0701 - 04/13 0700 In: 956.1 [P.O.:60; I.V.:896.1] Out: 200 [Urine:200]  GENERAL:alert, no distress and comfortable OROPHARYNX:no exudate, no erythema and lips, buccal mucosa, and tongue normal  LUNGS: clear to auscultation and percussion with normal breathing effort HEART: regular rate & rhythm and no murmurs and no lower extremity edema ABDOMEN:abdomen soft, non-tender and normal bowel sounds. PEG tube site without erythema or drainage NEURO: alert & oriented x 3 with fluent speech, no focal motor/sensory deficits  LABORATORY DATA:  I have reviewed the data as listed CMP Latest Ref Rng & Units 07/23/2019 07/22/2019 07/21/2019  Glucose 70 - 99 mg/dL 138(H) 120(H) 122(H)  BUN 8 - 23 mg/dL 23 30(H) 31(H)  Creatinine 0.61 - 1.24 mg/dL 1.27(H) 1.45(H) 1.41(H)  Sodium 135 - 145 mmol/L 141 141 139  Potassium 3.5 - 5.1 mmol/L 3.9 4.2 4.1  Chloride 98 - 111 mmol/L 108 106 100  CO2 22 - 32 mmol/L _0 Calcium 8.9 - 10.3 mg/dL 9.4 10.2 11.3(H)  Total Protein 6.5 - 8.1 g/dL 5.6(L) - 7.0  Total Bilirubin 0.3 - 1.2 mg/dL 0.7 - 0.8  Alkaline Phos 38 - 126 U/L 54 - 60  AST 15 - 41 U/L 16 - 21  ALT 0 - 44 U/L 18 - 27    Lab Results  Component Value Date   WBC 8.5 07/23/2019   HGB 10.7 (L) 07/23/2019   HCT 33.5 (L) 07/23/2019   MCV 92.5 07/23/2019   PLT 260 07/23/2019   NEUTROABS 9.4 (H) 07/21/2019    IR GASTROSTOMY TUBE MOD SED  Result Date: 07/08/2019 INDICATION: 74 year old male with obstructing esophageal mass and  resultant dysphagia. EXAM: Fluoroscopically guided placement of percutaneous balloon retention gastrostomy tube Interventional Radiologist:  Criselda Peaches, MD MEDICATIONS: 2 g Ancef; Antibiotics were administered within 1 hour of the procedure. ANESTHESIA/SEDATION: Versed 2 mg IV; Fentanyl 100 mcg IV Moderate Sedation Time:  16 minutes The patient was continuously monitored during the procedure by the interventional radiology nurse under my direct supervision. CONTRAST:  51m OMNIPAQUE IOHEXOL 300 MG/ML  SOLN FLUOROSCOPY TIME:  Fluoroscopy Time: 2 minutes 36 seconds (50 mGy). COMPLICATIONS: None immediate. PROCEDURE: Informed written consent was obtained from the patient after a thorough discussion of the procedural risks, benefits and alternatives. All questions were addressed. Maximal Sterile Barrier Technique was utilized including caps, mask, sterile gowns, sterile gloves, sterile drape, hand hygiene and skin antiseptic. A timeout was performed prior to the initiation of the procedure. Maximal barrier sterile technique utilized including caps, mask, sterile gowns, sterile gloves, large sterile drape, hand hygiene, and chlorhexadine skin prep. An angled catheter was advanced over a wire under fluoroscopic guidance through the nose, down the esophagus and into the body of the stomach. The stomach was then insufflated with several 100 ml of air. Fluoroscopy confirmed location of the gastric bubble, as well as inferior displacement of the barium stained colon. Under direct fluoroscopic guidance, two T-tacks were placed, and the anterior gastric wall drawn up against  the anterior abdominal wall. Percutaneous access was then obtained into the mid gastric body with an 18 gauge needle. Aspiration of air, and injection of contrast material under fluoroscopy confirmed needle placement. An Amplatz wire was advanced in the gastric body. The tract was then serially dilated to 72 [place] and an 18 [place] peel-away sheath  was advanced into the stomach. A 16 French into it percutaneous balloon retention gastrostomy tube was then lubricated and advanced through the peel-away sheath. The peel-away sheath was discarded. The retention balloon was inflated with 6 mL saline and pulled snug against the anterior abdominal wall. The balloon retention gastrostomy tube was then secured with the external bumper and capped. A hand injection of contrast material was performed confirming that the tube is intragastric in location. One of the T tack retention sutures was cut. The second was left in place. The external bumper was secured to the anterior abdominal wall using 2 0 Prolene sutures. The patient will be observed for several hours with the newly placed tube on low wall suction to evaluate for any post procedure complication. The patient tolerated the procedure well, there is no immediate complication. IMPRESSION: Successful placement of a 84 French balloon retention percutaneous gastrostomy tube. Electronically Signed   By: Jacqulynn Cadet M.D.   On: 07/08/2019 16:21   NM PET Image Initial (PI) Skull Base To Thigh  Result Date: 07/05/2019 CLINICAL DATA:  Initial treatment strategy for esophageal cancer. EXAM: NUCLEAR MEDICINE PET SKULL BASE TO THIGH TECHNIQUE: 11.01 mCi F-18 FDG was injected intravenously. Full-ring PET imaging was performed from the skull base to thigh after the radiotracer. CT data was obtained and used for attenuation correction and anatomic localization. Fasting blood glucose: 118 mg/dl COMPARISON:  CT abdomen and pelvis 05/06/2019 FINDINGS: Mediastinal blood pool activity: SUV max 2.81 Liver activity: SUV max NA NECK: No hypermetabolic lymph nodes in the neck. Incidental CT findings: none CHEST: Large mass in the distal esophagus with marked increased FDG uptake. Masslike thickening is circumferential (SUVmax = 24.6) (image 103, series 3 and series 4) wall thickening as much as 3.6 cm. In total the esophagus at  this level measures 7.5 x 5.2 cm. Multiple mediastinal lymph nodes beginning from just below the thoracic inlet also with lymph nodes in the retrocrural region and below the diaphragm. (Image 59, series 3): High right paratracheal lymph node just below the thoracic inlet with rounded borders measuring 2.0 cm. (SUVmax = 28.1) Tiny lymph node just anterior to the aorta (image 91, series 4) difficult to visualize on CT (SUVmax = 7.1) Juxta aortic lymph node just to the left of long the anterior margin of the aorta (image 96, series 4): (SUVmax = 11 0.0) Small discrete foci of increased FDG uptake at the level of the esophageal hiatus, these appear distinct from the dominant mass, a small hepatic gastric lymph node (image 121, series 4) likely corresponds to the area of uptake seen just anterior to the aorta, perhaps with some variable respiration there is misregistration on the current study (SUVmax = 13.6 No area of increased FDG uptake within the lung parenchyma) Incidental CT findings: Lungs are clear aside from minimal scarring in the anterior right chest. No pleural effusion. Airways are patent. Calcified atheromatous plaque throughout the thoracic aorta. Calcified coronary artery disease. ABDOMEN/PELVIS: Left para-aortic lymph node with intense FDG uptake (image 135, series 4) 10 mm (SUVmax = 20.1) hepato gastric lymph node as described. No areas of suspicious uptake within solid organs. There is an 8  mm right juxta crural lymph node (image 120, series 4) with similar uptake to the uptake seen in the hepato gastric lymph node. Incidental CT findings: CT appearance of liver, gallbladder, spleen, pancreas and adrenal glands is normal. Nonobstructing calculus in the upper pole the right kidney. No evidence of hydronephrosis. Left ureteral calculus measures 7 mm in the distal left ureter and is not associated with hydronephrosis, unchanged compared to previous imaging. No acute gastrointestinal finding. Appendix is  normal. Stool fills much of the colon. SKELETON: No focal hypermetabolic activity to suggest skeletal metastasis. Incidental CT findings: Spinal degenerative changes. IMPRESSION: 1. Signs of esophageal cancer with large esophageal mass with nodal involvement both above and below the diaphragm. 2. 7 mm distal left ureteral calculus is unchanged and does not lead to urinary tract obstruction. Electronically Signed   By: Zetta Bills M.D.   On: 07/05/2019 11:55    ASSESSMENT AND PLAN: 1. Esophagus cancer ? CT 05/06/2019-circumferential thickening of the distal esophagus, mild splenomegaly, bladder wall thickening, enlarged prostate ? Upper endoscopy 06/11/2019-ulcerated mass at the lower esophagus, 36 cm from the incisors, partially obstructing, biopsy revealed atypical cells-reactive change versus poorly differentiated neoplasm, outside consultation at [facility] on this biopsy and a repeat biopsy 06/21/2019 confirmed a poorly differentiated malignant neoplasm ? HER-2 negative, PD-L1 combined positive score -20 ? Repeat endoscopy 06/21/2019-partially obstructing esophageal tumor found in the lower third of the esophagus ? PET scan 07/05/2019-large esophageal mass with nodal involvement both above and below the diaphragm including a left periaortic node ? Palliative radiation starting 07/22/2019 2. Dementia 3. Anxiety/depression 4. Sleep apnea 5. BPH 6. Dysphagia secondary to #1  Gastrostomy tube placement 07/08/2019 7. Hyperlipidemia 8. Admission with hematemesis 07/21/2019  Mr. Feltz appears stable.  Hematemesis has now resolved.  Hemoglobin is currently stable.  He is tolerating his tube feeds well.  The patient may benefit from SNF for rehabilitation.  Recommendations: 1.  Continue radiation under the care of Dr. Lisbeth Renshaw. 2.  Recommend PT/OT consult.  3.  We will plan to initiate pembrolizumab upon completion of radiation. 4.   Continue gastrostomy tube feeding/hydration  Okay to discharge  patient in our standpoint.  We will see him as an outpatient as previously scheduled on 07/30/2019.   LOS: 2 days   Mikey Bussing, DNP, AGPCNP-BC, AOCNP 07/23/19 [name] was interviewed and examined.  His wife was not present when I saw him this morning.  He was admitted with hematemesis.  I suspect he was bleeding from the esophageal tumor.  No report of bleeding overnight.  The hemoglobin has not changed significantly over the past 24 hours.  He can follow-up as scheduled at the Cancer center.  He will need skilled nursing facility placement if he is family is unable to care for him in the home.

## 2019-07-23 NOTE — Progress Notes (Addendum)
PROGRESS NOTE    David [name]  WNI:[id number] DOB: 1946/02/27 DOA: 07/21/2019 PCP: Leighton Ruff, MD   Brief Narrative:  David [name] is a 74 y.o. male with medical history significant of dementia, HTN, HLD, OSA, recent diagnosis of esophageal cancer status post PEG tube presenting with new hematemesis.  Patient has dementia which he is aware of and is able to state that he has short-term memory loss so requested that I speak with his wife for better history.  He however does not report any complaints at this time except for his mouth being parched.  He is aware he had vomited up blood but was unaware of how much or how often.  He also recalls that he had dark stools once this past week.  Otherwise I spoke with his wife who reports that the patient last night up until around midnight vomited up bloody emesis about 6 times.  She reports this was new for him.  She wonders if his new protein supplement could have irritated the mucosa leading to this.  She denies any recent use of NSAIDs for the patient.  She states he still takes meds and water by mouth but does all his nutrition via the PEG tube.  She does not report any had any fevers, chills.  She did give a dose of Compazine last night prior to him coming in via EMS. She reports she has had weight loss but per review of chart it appears to be 3 pounds in the last 2 weeks as opposed to the concerned 15 pounds.  She does not recall any melena but states she did give him MiraLAX and perhaps that day he did have darker stools.  She reports he has been lightheaded and dizzy in the past couple weeks.  She reports home health nurse check orthostatic vitals on him and he has been positive and lightheaded when he stands.  Physical therapy has evaluated at home and he was recommended for a 4 wheeled walker.  She reports that she has been closely monitoring him as he ambulates in case he were to fall but he has not.  She reports he had been planned for  radiation therapy to begin tomorrow.  He has not had anything by mouth since right around midnight.   Assessment & Plan:   Active Problems:   Hematemesis  Hematemesis, acute upper GI bleed in setting of esophageal malignancy -Most likely concern would be bleeding at the site of malignancy; EGD on 3/2 revealed ulcerated mass at the lower esophagus -Patient is n.p.o. due to mass - PEG feeds/meds; some water PO but poorly tolerated -Continue IV pantoprazole 40 mg twice daily -GI evaluated, and no current indication for endoscopy given unable to pass scope at last EGD on 3/2, signing off follow-up outpatient as necessary -Hemoglobin continues to downtrend - unclear if there is any overt treatment other than monitor and transfuse if symptomatic or below 7.0 given poor endoscopy access and advanced disease. -Maintain peripheral access, monitor H/H, continue IV fluids -Resume home PEG tube feeds and medication. CBC Latest Ref Rng & Units 07/23/2019 07/22/2019 07/21/2019  WBC 4.0 - 10.5 K/uL 8.5 9.4 10.9(H)  Hemoglobin 13.0 - 17.0 g/dL 10.7(L) 11.2(L) 12.0(L)  Hematocrit 39.0 - 52.0 % 33.5(L) 36.0(L) 37.6(L)  Platelets 150 - 400 K/uL 260 268 283    AKI, likely prerenal, improving -Continue IV fluids, free water flushes with PEG tube feeds and patient apparently tolerating clear liquids by mouth without incident in the  past. -Continue to follow with morning labs Lab Results  Component Value Date   CREATININE 1.27 (H) 07/23/2019   CREATININE 1.45 (H) 07/22/2019   CREATININE 1.41 (H) 07/21/2019    Esophageal cancer -Poorly differentiated pathology confirmed at [facility] -Patient follows with Dr. Learta Codding -PET scan reveals nodal involvement above and below diaphragm -Tolerated first episode of radiation therapy on 07/22/2019 to be followed by pembrolizumab per oncology's documentation - defer to their expertise  Dementia, unclear baseline Rule out acute metabolic encephalopathy,  polypharmacy -On donepezil and memantine as well as Zyprexa at home -Delirium precautions in place -Remains at baseline  HLD -Patient is on simvastatin wife reports had not been taking recently  Hypertension -Per chart history but recently has been orthostatic and not currently on any meds  Mood disorder, anxiety/depression -On escitalopram, bupropion with as needed Ativan at home  BPH -On tamsulosin at home  DVT prophylaxis: SCDs Code Status: Full Family Communication: Wife unavailable by phone - will attempt to call again this afternoon. Admission status:  Inpatient, continues to require IV fluids, close monitoring given concern for ongoing worsening anemia in the setting of active GI bleed. Disposition Plan: Pending further clinical evaluation and work-up, if patient's hemoglobin remained stable, tolerates radiation well and can tolerate PEG tube feeds and free water flushes with improvement in patient's creatinine would consider discharge home in the next 24-48h. Wife previously indicated interest in SNF but was unable to qualify last time - will continue PT/OT eval and see if patient requires any additional support or if home health is more reasonable.  Consultants:   Radiology oncology, GI Dr. Therisa Doyne, oncology Dr. Benay Spice.  Subjective: No acute issues or events overnight, review of systems somewhat limited given patient answered "I'm not sure" when asked about radiation therapy - but he declines any current shortness of breath, nausea, vomiting, diarrhea, constipation, headache, fevers, chills.  Objective: Vitals:   07/22/19 1039 07/22/19 1504 07/22/19 2020 07/23/19 0549  BP: 136/73 131/80 120/64 122/72  Pulse: 84 81 75 71  Resp:  20 18 18   Temp: 97.8 F (36.6 C) 97.6 F (36.4 C) 98.2 F (36.8 C) 98.5 F (36.9 C)  TempSrc: Oral Oral  Oral  SpO2: 98% 100% 99% 97%  Weight:      Height:        Intake/Output Summary (Last 24 hours) at 07/23/2019 0733 Last data  filed at 07/23/2019 0500 Gross per 24 hour  Intake 956.06 ml  Output 200 ml  Net 756.06 ml   Filed Weights   07/21/19 0125 07/21/19 1312  Weight: 99.6 kg 98.2 kg    Examination: Constitutional: Pleasant, NAD, calm, comfortable.  Alert and oriented x3 but very slow to respond Eyes: PERRL, lids and conjunctivae normal ENMT: Mucous membranes are moist. Posterior pharynx clear of any exudate or lesions.Normal dentition.  Neck: normal, supple, no masses, no thyromegaly Respiratory: clear to auscultation bilaterally, no wheezing, no crackles. Normal respiratory effort. No accessory muscle use.  Cardiovascular: Regular rate and rhythm, no murmurs / rubs / gallops. No extremity edema. 2+ pedal pulses. No carotid bruits.  Abdomen: PEG tube in place, no tenderness, no masses palpated. No hepatosplenomegaly. Bowel sounds positive.  Musculoskeletal: no clubbing / cyanosis. No joint deformity upper and lower extremities. Good ROM, no contractures. Normal muscle tone.  Skin: no rashes, lesions, ulcers. No induration Neurologic: CN 2-12 grossly intact.  Moving all extremities   Data Reviewed: I have personally reviewed following labs and imaging studies  CBC: Recent Labs  Lab 07/21/19 0123 07/21/19 0945 07/21/19 1701 07/22/19 0211 07/23/19 0538  WBC 11.7* 11.9* 10.9* 9.4 8.5  NEUTROABS 9.4*  --   --   --   --   HGB 13.1 13.0 12.0* 11.2* 10.7*  HCT 40.6 39.3 37.6* 36.0* 33.5*  MCV 90.8 89.9 92.2 96.0 92.5  PLT 328 317 283 268 786   Basic Metabolic Panel: Recent Labs  Lab 07/21/19 0123 07/22/19 0211 07/23/19 0538  NA 139 141 141  K 4.1 4.2 3.9  CL 100 106 108  CO2 28 27 24   GLUCOSE 122* 120* 138*  BUN 31* 30* 23  CREATININE 1.41* 1.45* 1.27*  CALCIUM 11.3* 10.2 9.4   GFR: Estimated Creatinine Clearance: 61.1 mL/min (A) (by C-G formula based on SCr of 1.27 mg/dL (H)). Liver Function Tests: Recent Labs  Lab 07/21/19 0123 07/23/19 0538  AST 21 16  ALT 27 18  ALKPHOS 60 54    BILITOT 0.8 0.7  PROT 7.0 5.6*  ALBUMIN 3.8 2.9*   No results for input(s): LIPASE, AMYLASE in the last 168 hours. No results for input(s): AMMONIA in the last 168 hours. Coagulation Profile: Recent Labs  Lab 07/21/19 0123  INR 1.0   Cardiac Enzymes: No results for input(s): CKTOTAL, CKMB, CKMBINDEX, TROPONINI in the last 168 hours. BNP (last 3 results) No results for input(s): PROBNP in the last 8760 hours. HbA1C: No results for input(s): HGBA1C in the last 72 hours. CBG: Recent Labs  Lab 07/22/19 1502  GLUCAP 95   Lipid Profile: No results for input(s): CHOL, HDL, LDLCALC, TRIG, CHOLHDL, LDLDIRECT in the last 72 hours. Thyroid Function Tests: No results for input(s): TSH, T4TOTAL, FREET4, T3FREE, THYROIDAB in the last 72 hours. Anemia Panel: No results for input(s): VITAMINB12, FOLATE, FERRITIN, TIBC, IRON, RETICCTPCT in the last 72 hours. Sepsis Labs: No results for input(s): PROCALCITON, LATICACIDVEN in the last 168 hours.  Recent Results (from the past 240 hour(s))  Respiratory Panel by RT PCR (Flu A&B, Covid) - Nasopharyngeal Swab     Status: None   Collection Time: 07/21/19  4:07 AM   Specimen: Nasopharyngeal Swab  Result Value Ref Range Status   SARS Coronavirus 2 by RT PCR NEGATIVE NEGATIVE Final    Comment: (NOTE) SARS-CoV-2 target nucleic acids are NOT DETECTED. The SARS-CoV-2 RNA is generally detectable in upper respiratoy specimens during the acute phase of infection. The lowest concentration of SARS-CoV-2 viral copies this assay can detect is 131 copies/mL. A negative result does not preclude SARS-Cov-2 infection and should not be used as the sole basis for treatment or other patient management decisions. A negative result may occur with  improper specimen collection/handling, submission of specimen other than nasopharyngeal swab, presence of viral mutation(s) within the areas targeted by this assay, and inadequate number of viral copies (<131  copies/mL). A negative result must be combined with clinical observations, patient history, and epidemiological information. The expected result is Negative. Fact Sheet for Patients:  PinkCheek.be Fact Sheet for Healthcare Providers:  GravelBags.it This test is not yet ap proved or cleared by the [place] FDA and  has been authorized for detection and/or diagnosis of SARS-CoV-2 by FDA under an Emergency Use Authorization (EUA). This EUA will remain  in effect (meaning this test can be used) for the duration of the COVID-19 declaration under Section 564(b)(1) of the Act, 21 U.S.C. section 360bbb-3(b)(1), unless the authorization is terminated or revoked sooner.    Influenza A by PCR NEGATIVE NEGATIVE Final   Influenza B  by PCR NEGATIVE NEGATIVE Final    Comment: (NOTE) The Xpert Xpress SARS-CoV-2/FLU/RSV assay is intended as an aid in  the diagnosis of influenza from Nasopharyngeal swab specimens and  should not be used as a sole basis for treatment. Nasal washings and  aspirates are unacceptable for Xpert Xpress SARS-CoV-2/FLU/RSV  testing. Fact Sheet for Patients: PinkCheek.be Fact Sheet for Healthcare Providers: GravelBags.it This test is not yet approved or cleared by the [place] FDA and  has been authorized for detection and/or diagnosis of SARS-CoV-2 by  FDA under an Emergency Use Authorization (EUA). This EUA will remain  in effect (meaning this test can be used) for the duration of the  Covid-19 declaration under Section 564(b)(1) of the Act, 21  U.S.C. section 360bbb-3(b)(1), unless the authorization is  terminated or revoked. Performed at [facility], [place] [address], [place], [place] [address]      Radiology Studies: No results found.  Scheduled Meds:  buPROPion  150 mg Oral Daily   cholecalciferol  2,000 Units Oral  Daily   donepezil  5 mg Per Tube QHS   escitalopram  20 mg Per Tube Daily   feeding supplement (KATE FARMS STANDARD 1.4)  325 mL Per Tube Q24H   feeding supplement (KATE FARMS STANDARD 1.4)  960 mL Per Tube Q24H   free water  100 mL Per Tube Q4H   memantine  10 mg Per Tube BID   OLANZapine  2.5 mg Per Tube QHS   pantoprazole (PROTONIX) IV  40 mg Intravenous Q12H   simvastatin  40 mg Per Tube q1800   tamsulosin  0.4 mg Oral Daily   vitamin B-12  1,000 mcg Per Tube Daily   Continuous Infusions:  sodium chloride 100 mL/hr at 07/22/19 2150     LOS: 2 days   Time spent: 20min  Millena Callins C Reuel Lamadrid, DO [facility]  If 7PM-7AM, please contact night-coverage [email or web address]  07/23/2019, 7:33 AM

## 2019-07-24 ENCOUNTER — Ambulatory Visit
Admission: RE | Admit: 2019-07-24 | Discharge: 2019-07-24 | Disposition: A | Discharge status: Home / Self Care | Payer: Medicare Other | Source: Ambulatory Visit | Attending: Radiation Oncology | Admitting: Radiation Oncology

## 2019-07-24 ENCOUNTER — Ambulatory Visit: Payer: Medicare Other

## 2019-07-24 DIAGNOSIS — K920 Hematemesis: Secondary | ICD-10-CM | POA: Diagnosis not present

## 2019-07-24 DIAGNOSIS — C155 Malignant neoplasm of lower third of esophagus: Secondary | ICD-10-CM | POA: Diagnosis not present

## 2019-07-24 LAB — COMPREHENSIVE METABOLIC PANEL
ALT: 18 U/L (ref 0–44)
AST: 16 U/L (ref 15–41)
Albumin: 2.8 g/dL — ABNORMAL LOW (ref 3.5–5.0)
Alkaline Phosphatase: 50 U/L (ref 38–126)
Anion gap: 8 (ref 5–15)
BUN: 17 mg/dL (ref 8–23)
CO2: 26 mmol/L (ref 22–32)
Calcium: 8.9 mg/dL (ref 8.9–10.3)
Chloride: 105 mmol/L (ref 98–111)
Creatinine, Ser: 1.18 mg/dL (ref 0.61–1.24)
GFR calc Af Amer: >60 mL/min (ref >60)
GFR calc non Af Amer: >60 mL/min (ref >60)
Glucose, Bld: 136 mg/dL — ABNORMAL HIGH (ref 70–99)
Potassium: 4 mmol/L (ref 3.5–5.1)
Sodium: 139 mmol/L (ref 135–145)
Total Bilirubin: 0.6 mg/dL (ref 0.3–1.2)
Total Protein: 5.4 g/dL — ABNORMAL LOW (ref 6.5–8.1)

## 2019-07-24 LAB — CBC
HCT: 32.9 % — ABNORMAL LOW (ref 39.0–52.0)
Hemoglobin: 10.3 g/dL — ABNORMAL LOW (ref 13.0–17.0)
MCH: 29.1 pg (ref 26.0–34.0)
MCHC: 31.3 g/dL (ref 30.0–36.0)
MCV: 92.9 fL (ref 80.0–100.0)
Platelets: 227 10*3/uL (ref 150–400)
RBC: 3.54 MIL/uL — ABNORMAL LOW (ref 4.22–5.81)
RDW: 12.3 % (ref 11.5–15.5)
WBC: 7.7 10*3/uL (ref 4.0–10.5)
nRBC: 0 % (ref 0.0–0.2)

## 2019-07-24 NOTE — Progress Notes (Signed)
Wife wants to be called at mobile number in chart after 0830 07/25/19.

## 2019-07-24 NOTE — Evaluation (Signed)
Physical Therapy Evaluation Patient Details Name: David [name] MRN: [id number] DOB: Sep 01, 1945 Today's Date: 07/24/2019   History of Present Illness  74 y.o. male with medical history significant of dementia, HTN, HLD, OSA, recent diagnosis of esophageal cancer status post PEG tube 07/08/19 and  admitted with new hematemesis  Clinical Impression  Pt admitted with above diagnosis.  Pt currently with functional limitations due to the deficits listed below (see PT Problem List). Pt will benefit from skilled PT to increase their independence and safety with mobility to allow discharge to the venue listed below.  Pt familiar to this PT from previous admission.  Pt able to ambulate in hallway without assistive device.  Pt does report feeling "sad" today and states he feels like reality and gravity of his situation are finally settling in.  Will continue to assist with mobilizing in acute care (pt typically remains in bed and sleeping during admission) however no skilled PT needs identified for follow up.     Follow Up Recommendations No PT follow up;Supervision - Intermittent    Equipment Recommendations  None recommended by PT    Recommendations for Other Services       Precautions / Restrictions Precautions Precaution Comments: G tube      Mobility  Bed Mobility Overal bed mobility: Modified Independent             General bed mobility comments: increased time  Transfers Overall transfer level: Needs assistance Equipment used: None Transfers: Sit to/from Stand Sit to Stand: Supervision         General transfer comment: for safety  Ambulation/Gait Ambulation/Gait assistance: Supervision Gait Distance (Feet): 300 Feet Assistive device: None Gait Pattern/deviations: Step-through pattern;Decreased stride length     General Gait Details: pt ambulated without any UE support today and steady with slow pace, no LOB observed  Stairs            Wheelchair  Mobility    Modified Rankin (Stroke Patients Only)       Balance Overall balance assessment: No apparent balance deficits (not formally assessed)                                           Pertinent Vitals/Pain Pain Assessment: Faces Faces Pain Scale: Hurts a little bit Pain Location: general discomfort at PEG site  Pain Descriptors / Indicators: Discomfort;Grimacing Pain Intervention(s): Monitored during session;Repositioned    Home Living Family/patient expects to be discharged to:: Private residence Living Arrangements: Alone     Home Access: Stairs to enter   Technical brewer of Steps: reports living in "second floor unit" Home Layout: One level Home Equipment: None Additional Comments: pt reports his "wife" has been assisting him at home, does not recall using assistive device    Prior Function Level of Independence: Independent with assistive device(s)         Comments: per notes, HHPT and using 4 wheeled walker? - pt does not recall     Hand Dominance        Extremity/Trunk Assessment        Lower Extremity Assessment Lower Extremity Assessment: Overall WFL for tasks assessed    Cervical / Trunk Assessment Cervical / Trunk Assessment: Normal  Communication   Communication: No difficulties  Cognition Arousal/Alertness: Awake/alert Behavior During Therapy: WFL for tasks assessed/performed Overall Cognitive Status: No family/caregiver present to determine baseline cognitive functioning  General Comments: hx dementia, memory loss      General Comments      Exercises     Assessment/Plan    PT Assessment Patient needs continued PT services  PT Problem List Decreased strength;Decreased mobility;Decreased knowledge of use of DME       PT Treatment Interventions Gait training;Therapeutic exercise;DME instruction;Therapeutic activities;Functional mobility training;Stair  training;Balance training;Patient/family education    PT Goals (Current goals can be found in the Care Plan section)  Acute Rehab PT Goals PT Goal Formulation: With patient Time For Goal Achievement: 08/07/19 Potential to Achieve Goals: Good    Frequency Min 3X/week   Barriers to discharge        Co-evaluation               AM-PAC PT "6 Clicks" Mobility  Outcome Measure Help needed turning from your back to your side while in a flat bed without using bedrails?: None Help needed moving from lying on your back to sitting on the side of a flat bed without using bedrails?: None Help needed moving to and from a bed to a chair (including a wheelchair)?: None Help needed standing up from a chair using your arms (e.g., wheelchair or bedside chair)?: None Help needed to walk in hospital room?: A Little Help needed climbing 3-5 steps with a railing? : A Little 6 Click Score: 22    End of Session   Activity Tolerance: Patient tolerated treatment well Patient left: in bed;with call bell/phone within reach;with bed alarm set Nurse Communication: Mobility status PT Visit Diagnosis: Difficulty in walking, not elsewhere classified (R26.2)    Time: 1036-1050 PT Time Calculation (min) (ACUTE ONLY): 14 min   Charges:   PT Evaluation $PT Eval Low Complexity: 1 Low     Kati PT, DPT Acute Rehabilitation Services Office: [phone]  Trena Platt 07/24/2019, 12:46 PM

## 2019-07-24 NOTE — Care Management Important Message (Signed)
Important Message  Patient Details M Letter given to Roque Lias SW Case Manager to present to the Patient Name: David [name] Hittle MRN: [id number] Date of Birth: 1945-06-27   Medicare Important Message Given:  Yes     Kerin Salen 07/24/2019, 11:50 AM

## 2019-07-24 NOTE — Progress Notes (Signed)
[facility]  PROGRESS NOTE  David [name] XBL:[id number] DOB: July 07, 1945 DOA: 07/21/2019 PCP: Leighton Ruff, MD   Brief HPI:   74 year old male with a history of dementia, hypertension, hyperlipidemia, OSA, recent diagnosis of esophageal cancer s/p PEG tube presenting with new hematemesis.    Subjective   Patient seen and examined, denies any complaints.  Patient started on radiation treatment   Assessment/Plan:     1. Hematemesis-in setting of esophageal malignancy.  EGD on 3 2 revealed ulcerated mass at the lower esophagus.  Patient is unclear due to mass.  He is on PEG tube feeding.  Continue IV Protonix 40 mg twice daily.  GI evaluated the patient and no current indication for endoscopy given unable to pass scope at last EGD on 06/11/2019.  GI to follow-up as outpatient as necessary.  Hemoglobin is stable at 10.3. 2. Acute kidney injury-likely prerenal, creatinine has improved with IV normal saline.  Follow BMP in a.m. 3. Esophageal cancer-poorly differentiated pathology, confirmed at [facility].  Patient follows Dr. Malachy Mood.  PET scan showed nodal involvement above and below diaphragm.  Patient tolerated first episode of radiation therapy on 07/22/2019 he is currently receiving daily radiation treatment to be followed by pembrolizumab as per oncology. 4. Dementia-unclear baseline.  Continue donepezil, Zyprexa. 5. Hyperlipidemia-continue simvastatin 6. BPH-continue tamsulosin 7. Mood disorder/anxiety/depression-continue Lexapro, bupropion, as needed Ativan.    SpO2: 100 %   COVID-19 Labs  No results for input(s): DDIMER, FERRITIN, LDH, CRP in the last 72 hours.  Lab Results  Component Value Date   SARSCOV2NAA NEGATIVE 07/21/2019   [place] NEGATIVE 07/05/2019     CBG: Recent Labs  Lab 07/22/19 1502  GLUCAP 95    CBC: Recent Labs  Lab 07/21/19 0123 07/21/19 0123 07/21/19 0945 07/21/19 1701 07/22/19 0211 07/23/19 0538 07/24/19 0556  WBC 11.7*    < > 11.9* 10.9* 9.4 8.5 7.7  NEUTROABS 9.4*  --   --   --   --   --   --   HGB 13.1   < > 13.0 12.0* 11.2* 10.7* 10.3*  HCT 40.6   < > 39.3 37.6* 36.0* 33.5* 32.9*  MCV 90.8   < > 89.9 92.2 96.0 92.5 92.9  PLT 328   < > 317 283 268 260 227   < > = values in this interval not displayed.    Basic Metabolic Panel: Recent Labs  Lab 07/21/19 0123 07/22/19 0211 07/23/19 0538 07/24/19 0556  NA 139 141 141 139  K 4.1 4.2 3.9 4.0  CL 100 106 108 105  CO2 28 27 24 26   GLUCOSE 122* 120* 138* 136*  BUN 31* 30* 23 17  CREATININE 1.41* 1.45* 1.27* 1.18  CALCIUM 11.3* 10.2 9.4 8.9     Liver Function Tests: Recent Labs  Lab 07/21/19 0123 07/23/19 0538 07/24/19 0556  AST 21 16 16   ALT 27 18 18   ALKPHOS 60 54 50  BILITOT 0.8 0.7 0.6  PROT 7.0 5.6* 5.4*  ALBUMIN 3.8 2.9* 2.8*        DVT prophylaxis: SCDs  Code Status: Full code  Family Communication: No family at bedside  Disposition Plan: Patient presents with hematemesis, currently resolved.  Wife was interested in skilled nursing facility, PT recommends no PT follow-up.  Patient likely will be discharged home.         Scheduled medications:  . buPROPion  150 mg Oral Daily  . cholecalciferol  2,000 Units Oral Daily  . donepezil  5 mg  Per Tube QHS  . escitalopram  20 mg Per Tube Daily  . feeding supplement (KATE FARMS STANDARD 1.4)  325 mL Per Tube Q24H  . feeding supplement (KATE FARMS STANDARD 1.4)  960 mL Per Tube Q24H  . free water  100 mL Per Tube Q4H  . memantine  10 mg Per Tube BID  . OLANZapine  2.5 mg Per Tube QHS  . pantoprazole (PROTONIX) IV  40 mg Intravenous Q12H  . simvastatin  40 mg Per Tube q1800  . tamsulosin  0.4 mg Oral Daily  . vitamin B-12  1,000 mcg Per Tube Daily    Consultants:  Oncology  Procedures:    Antibiotics:   Anti-infectives (From admission, onward)   None       Objective   Vitals:   07/23/19 1307 07/23/19 2052 07/24/19 0529 07/24/19 1433  BP: 125/77 128/71  (!) 154/73 (!) 147/79  Pulse: 70 66 70 67  Resp: 18 20 16 16   Temp: 97.8 F (36.6 C) 98.7 F (37.1 C) 98.3 F (36.8 C) 99.3 F (37.4 C)  TempSrc: Oral Oral Oral Oral  SpO2: 100% 100% 98% 100%  Weight:      Height:        Intake/Output Summary (Last 24 hours) at 07/24/2019 1708 Last data filed at 07/24/2019 1350 Gross per 24 hour  Intake 1298 ml  Output 800 ml  Net 498 ml    04/12 1901 - 04/14 0700 In: 3627.8 [P.O.:150; I.V.:2074.8] Out: 1005 [Urine:1005]  Filed Weights   07/21/19 0125 07/21/19 1312  Weight: 99.6 kg 98.2 kg    Physical Examination:   General-appears in no acute distress Heart-S1-S2, regular, no murmur auscultated Lungs-clear to auscultation bilaterally, no wheezing or crackles auscultated Abdomen-soft, nontender, no organomegaly Extremities-no edema in the lower extremities Neuro-alert, oriented x3, no focal deficit noted   Data Reviewed:   Recent Results (from the past 240 hour(s))  Respiratory Panel by RT PCR (Flu A&B, Covid) - Nasopharyngeal Swab     Status: None   Collection Time: 07/21/19  4:07 AM   Specimen: Nasopharyngeal Swab  Result Value Ref Range Status   SARS Coronavirus 2 by RT PCR NEGATIVE NEGATIVE Final    Comment: (NOTE) SARS-CoV-2 target nucleic acids are NOT DETECTED. The SARS-CoV-2 RNA is generally detectable in upper respiratoy specimens during the acute phase of infection. The lowest concentration of SARS-CoV-2 viral copies this assay can detect is 131 copies/mL. A negative result does not preclude SARS-Cov-2 infection and should not be used as the sole basis for treatment or other patient management decisions. A negative result may occur with  improper specimen collection/handling, submission of specimen other than nasopharyngeal swab, presence of viral mutation(s) within the areas targeted by this assay, and inadequate number of viral copies (<131 copies/mL). A negative result must be combined with  clinical observations, patient history, and epidemiological information. The expected result is Negative. Fact Sheet for Patients:  PinkCheek.be Fact Sheet for Healthcare Providers:  GravelBags.it This test is not yet ap proved or cleared by the [place] FDA and  has been authorized for detection and/or diagnosis of SARS-CoV-2 by FDA under an Emergency Use Authorization (EUA). This EUA will remain  in effect (meaning this test can be used) for the duration of the COVID-19 declaration under Section 564(b)(1) of the Act, 21 U.S.C. section 360bbb-3(b)(1), unless the authorization is terminated or revoked sooner.    Influenza A by PCR NEGATIVE NEGATIVE Final   Influenza B by PCR NEGATIVE NEGATIVE  Final    Comment: (NOTE) The Xpert Xpress SARS-CoV-2/FLU/RSV assay is intended as an aid in  the diagnosis of influenza from Nasopharyngeal swab specimens and  should not be used as a sole basis for treatment. Nasal washings and  aspirates are unacceptable for Xpert Xpress SARS-CoV-2/FLU/RSV  testing. Fact Sheet for Patients: PinkCheek.be Fact Sheet for Healthcare Providers: GravelBags.it This test is not yet approved or cleared by the [place] FDA and  has been authorized for detection and/or diagnosis of SARS-CoV-2 by  FDA under an Emergency Use Authorization (EUA). This EUA will remain  in effect (meaning this test can be used) for the duration of the  Covid-19 declaration under Section 564(b)(1) of the Act, 21  U.S.C. section 360bbb-3(b)(1), unless the authorization is  terminated or revoked. Performed at [facility], [place] [address], [place], [place] [address]       Admission status: The appropriate admission status for this patient is INPATIENT. Inpatient status is judged to be reasonable and necessary in order to provide the required  intensity of service to ensure the patient's safety. The patient's presenting symptoms, physical exam findings, and initial radiographic and laboratory data in the context of their chronic comorbidities is felt to place them at high risk for further clinical deterioration. Furthermore, it is not anticipated that the patient will be medically stable for discharge from the hospital within 2 midnights of admission. The following factors support the admission status of inpatient.    The patient's presenting symptoms include hematemesis The worrisome physical exam findings include . The initial radiographic and laboratory data are worrisome because of esophageal cancer The chronic co-morbidities include dementia    * I certify that at the point of admission it is my clinical judgment that the patient will require inpatient hospital care spanning beyond 2 midnights from the point of admission due to high intensity of service, high risk for further deterioration and high frequency of surveillance required.Oswald Hillock   [facility] If 7PM-7AM, please contact night-coverage at [email or web address], Office  [phone]   07/24/2019, 5:08 PM  LOS: 3 days

## 2019-07-25 ENCOUNTER — Ambulatory Visit
Admission: RE | Admit: 2019-07-25 | Discharge: 2019-07-25 | Disposition: A | Discharge status: Home / Self Care | Payer: Medicare Other | Source: Ambulatory Visit | Attending: Radiation Oncology | Admitting: Radiation Oncology

## 2019-07-25 ENCOUNTER — Ambulatory Visit: Payer: Medicare Other

## 2019-07-25 DIAGNOSIS — K920 Hematemesis: Secondary | ICD-10-CM | POA: Diagnosis not present

## 2019-07-25 DIAGNOSIS — C155 Malignant neoplasm of lower third of esophagus: Secondary | ICD-10-CM | POA: Diagnosis not present

## 2019-07-25 LAB — COMPREHENSIVE METABOLIC PANEL
ALT: 20 U/L (ref 0–44)
AST: 15 U/L (ref 15–41)
Albumin: 2.9 g/dL — ABNORMAL LOW (ref 3.5–5.0)
Alkaline Phosphatase: 54 U/L (ref 38–126)
Anion gap: 8 (ref 5–15)
BUN: 14 mg/dL (ref 8–23)
CO2: 26 mmol/L (ref 22–32)
Calcium: 8.8 mg/dL — ABNORMAL LOW (ref 8.9–10.3)
Chloride: 104 mmol/L (ref 98–111)
Creatinine, Ser: 1 mg/dL (ref 0.61–1.24)
GFR calc Af Amer: >60 mL/min (ref >60)
GFR calc non Af Amer: >60 mL/min (ref >60)
Glucose, Bld: 116 mg/dL — ABNORMAL HIGH (ref 70–99)
Potassium: 3.7 mmol/L (ref 3.5–5.1)
Sodium: 138 mmol/L (ref 135–145)
Total Bilirubin: 0.6 mg/dL (ref 0.3–1.2)
Total Protein: 5.6 g/dL — ABNORMAL LOW (ref 6.5–8.1)

## 2019-07-25 LAB — CBC
HCT: 34.4 % — ABNORMAL LOW (ref 39.0–52.0)
Hemoglobin: 10.9 g/dL — ABNORMAL LOW (ref 13.0–17.0)
MCH: 29.1 pg (ref 26.0–34.0)
MCHC: 31.7 g/dL (ref 30.0–36.0)
MCV: 91.7 fL (ref 80.0–100.0)
Platelets: 225 10*3/uL (ref 150–400)
RBC: 3.75 MIL/uL — ABNORMAL LOW (ref 4.22–5.81)
RDW: 12.1 % (ref 11.5–15.5)
WBC: 6.8 10*3/uL (ref 4.0–10.5)
nRBC: 0 % (ref 0.0–0.2)

## 2019-07-25 MED ORDER — PANTOPRAZOLE SODIUM 40 MG PO TBEC: 40.0000 mg | DELAYED_RELEASE_TABLET | Freq: Every day | ORAL | 11 refills | Status: DC

## 2019-07-25 MED ORDER — ESOMEPRAZOLE MAGNESIUM 20 MG PO CPDR: 20.0000 mg | DELAYED_RELEASE_CAPSULE | Freq: Every day | ORAL | 3 refills | Status: DC

## 2019-07-25 NOTE — TOC Transition Note (Signed)
Transition of Care [facility]) - CM/SW Discharge Note   Patient Details  Name: Raymel Cull Swailes MRN: [id number] Date of Birth: 1945/05/18  [facility] of [facility]) CM/SW Contact:  Trish Mage, LCSW Phone Number: 07/25/2019, 10:39 AM   Clinical Narrative:  Spoke with wife, [name] Sandora, who is actively seeking increased services for patient in home as she works full time and has disable adult daughter for whom she is responsible.  She understands he is going to be discharged today, and wants to know about increase in services prior to d/c as insurance has informed her "patient is eligible for up to 8 hours of skilled nursing per day."  We agreed to talk later after both she and I do more research in to this. I saw in chart that patient had been referred to [facility] through [facility] by my co-worker in oncology, thank you Vernie Shanks, and they stated he is on waiting list to start 16 hours a week of in home care, which would be M-TH, 9-1.  They are hopeful that can start as soon as next week sometime. I called Amy with [facility] who are already active with patient for PT, RN services.  She stated that if Dr orders SW as part of [facility] services, [place] [facility] their CSW would meet with patient and wife as soon as tomorrow, do a thorough assessment and get as many hours of services set up in home as possible. TOC will continue to follow during the course of hospitalization.      Final next level of care: [place]     Patient Goals and CMS Choice        Discharge Placement                       Discharge Plan and Services                                     Social Determinants of Health (SDOH) Interventions     Readmission Risk Interventions No flowsheet data found.

## 2019-07-25 NOTE — Plan of Care (Signed)
  Problem: Education: Goal: Knowledge of General Education information will improve Description: Including pain rating scale, medication(s)/side effects and non-pharmacologic comfort measures Outcome: Adequate for Discharge   

## 2019-07-25 NOTE — Discharge Summary (Addendum)
Physician Discharge Summary  Nuh Lipton Ruhe MCN:[id number] DOB: July 09, 1945 DOA: 07/21/2019  PCP: Leighton Ruff, MD  Admit date: 07/21/2019 Discharge date: 07/25/2019  Time spent: 50 minutes  Recommendations for Outpatient Follow-up:  1. Follow-up PCP in 2 weeks    Discharge Diagnoses:  Active Problems:   Hematemesis   Discharge Condition: Stable  Diet recommendation: Tube feedings, ice chips only by mouth  Filed Weights   07/21/19 0125 07/21/19 1312  Weight: 99.6 kg 98.2 kg    History of present illness:  74 year old male with a history of dementia, hypertension, hyperlipidemia, OSA, recent diagnosis of esophageal cancer s/p PEG tube presenting with new hematemesis.  Hospital Course:   1. *Hematemesis-in setting of esophageal malignancy.  EGD on 3 2 revealed ulcerated mass at the lower esophagus.  Patient is unclear due to mass.  He is on PEG tube feeding.    Patient was started on  IV Protonix 40 mg twice daily.  GI evaluated the patient and no current indication for endoscopy given unable to pass scope at last EGD on 06/11/2019.  GI to follow-up as outpatient as necessary.  Hemoglobin is stable at 10.3.  Will discharge on Protonix 40 mg daily via tube. 2. Acute kidney injury-likely prerenal, creatinine has improved with IV normal saline.    Today creatinine is 1.00 3. Esophageal cancer-poorly differentiated pathology, confirmed at [facility].  Patient follows Dr. Malachy Mood.  PET scan showed nodal involvement above and below diaphragm.  Patient tolerated first episode of radiation therapy on 07/22/2019 he is currently receiving daily radiation treatment to be followed by pembrolizumab as per oncology. 4. Dementia-unclear baseline.  Continue donepezil, Zyprexa. 5. Hyperlipidemia-continue simvastatin 6. BPH-continue tamsulosin 7. Mood disorder/anxiety/depression-continue Lexapro, bupropion, as needed  Ativan.   Procedures:  None  Consultations:  Oncology  Gastroenterology  Discharge Exam: Vitals:   07/24/19 2022 07/25/19 0459  BP: 130/79 (!) 143/74  Pulse: 77 73  Resp: 16 17  Temp: 98.2 F (36.8 C) 98.1 F (36.7 C)  SpO2: 98% 96%    General: Appears in no acute distress Cardiovascular: S1-S2, regular Respiratory: Clear to auscultation bilaterally  Discharge Instructions   Discharge Instructions    Diet - low sodium heart healthy   Complete by: As directed    Increase activity slowly   Complete by: As directed      Allergies as of 07/25/2019      Reactions   Other    Other reaction(s): Other Sleep walk    Abilify [aripiprazole] Other (See Comments)   Short term memory loss   Lamictal [lamotrigine]    Increased agitation   Wellbutrin [bupropion] Anxiety   Patient does not recall allergy type Anxiety, confusion per Dr Edwin Dada note      Medication List    TAKE these medications   buPROPion 150 MG 24 hr tablet Commonly known as: WELLBUTRIN XL TAKE 1 TABLET ONCE DAILY.   donepezil 5 MG tablet Commonly known as: ARICEPT TAKE ONE TABLET AT BEDTIME.   escitalopram 20 MG tablet Commonly known as: LEXAPRO TAKE 1 TABLET ONCE DAILY.   esomeprazole 20 MG capsule Commonly known as: NexIUM Take 1 capsule (20 mg total) by mouth daily at 12 noon.   feeding supplement (OSMOLITE 1.5 CAL) Liqd Osmolite 1.5-- 237 mL 5 times a day flush with 50 mL of free water before and 50 mL of free water after the bolus. What changed:   how much to take  how to take this  when to take this  additional instructions   feeding supplement (PRO-STAT SUGAR FREE 64) Liqd Take 30 mLs by mouth 2 (two) times daily.   HYDROcodone-acetaminophen 5-325 MG tablet Commonly known as: NORCO/VICODIN Take 1 tablet by mouth every 6 (six) hours as needed for moderate pain. What changed: Another medication with the same name was removed. Continue taking this medication, and follow  the directions you see here.   LORazepam 0.5 MG tablet Commonly known as: ATIVAN Take 1 tablet (0.5 mg total) by mouth every 6 (six) hours as needed for anxiety.   memantine 10 MG tablet Commonly known as: NAMENDA TAKE 1 TABLET BY MOUTH TWICE DAILY.   OLANZapine 2.5 MG tablet Commonly known as: ZYPREXA Take 1 tablet (2.5 mg total) by mouth at bedtime.   prochlorperazine 5 MG tablet Commonly known as: COMPAZINE Take 1 tablet (5 mg total) by mouth every 6 (six) hours as needed for nausea or vomiting. May take orally or crush and take via tube.   simvastatin 40 MG tablet Commonly known as: ZOCOR Take 40 mg by mouth daily.   tamsulosin 0.4 MG Caps capsule Commonly known as: FLOMAX Take 0.4 mg by mouth daily.   vitamin B-12 1000 MCG tablet Commonly known as: CYANOCOBALAMIN Take 1,000 mcg by mouth daily.   VITAMIN D3 PO Take 2,000 Units by mouth daily.      Allergies  Allergen Reactions  . Other     Other reaction(s): Other Sleep walk   . Abilify [Aripiprazole] Other (See Comments)    Short term memory loss  . Lamictal [Lamotrigine]     Increased agitation  . Wellbutrin [Bupropion] Anxiety    Patient does not recall allergy type Anxiety, confusion per Dr Edwin Dada note   Follow-up Information    Leighton Ruff, MD Follow up in 2 week(s).   Specialty: Family Medicine Contact information: [place] [id number] [phone]            The results of significant diagnostics from this hospitalization (including imaging, microbiology, ancillary and laboratory) are listed below for reference.    Significant Diagnostic Studies: IR GASTROSTOMY TUBE MOD SED  Result Date: 07/08/2019 INDICATION: 74 year old male with obstructing esophageal mass and resultant dysphagia. EXAM: Fluoroscopically guided placement of percutaneous balloon retention gastrostomy tube Interventional Radiologist:  Criselda Peaches, MD MEDICATIONS: 2 g Ancef; Antibiotics  were administered within 1 hour of the procedure. ANESTHESIA/SEDATION: Versed 2 mg IV; Fentanyl 100 mcg IV Moderate Sedation Time:  16 minutes The patient was continuously monitored during the procedure by the interventional radiology nurse under my direct supervision. CONTRAST:  18mL OMNIPAQUE IOHEXOL 300 MG/ML  SOLN FLUOROSCOPY TIME:  Fluoroscopy Time: 2 minutes 36 seconds (50 mGy). COMPLICATIONS: None immediate. PROCEDURE: Informed written consent was obtained from the patient after a thorough discussion of the procedural risks, benefits and alternatives. All questions were addressed. Maximal Sterile Barrier Technique was utilized including caps, mask, sterile gowns, sterile gloves, sterile drape, hand hygiene and skin antiseptic. A timeout was performed prior to the initiation of the procedure. Maximal barrier sterile technique utilized including caps, mask, sterile gowns, sterile gloves, large sterile drape, hand hygiene, and chlorhexadine skin prep. An angled catheter was advanced over a wire under fluoroscopic guidance through the nose, down the esophagus and into the body of the stomach. The stomach was then insufflated with several 100 ml of air. Fluoroscopy confirmed location of the gastric bubble, as well as inferior displacement of the barium stained colon. Under direct fluoroscopic guidance, two T-tacks were  placed, and the anterior gastric wall drawn up against the anterior abdominal wall. Percutaneous access was then obtained into the mid gastric body with an 18 gauge needle. Aspiration of air, and injection of contrast material under fluoroscopy confirmed needle placement. An Amplatz wire was advanced in the gastric body. The tract was then serially dilated to 50 [place] and an 18 [place] peel-away sheath was advanced into the stomach. A 16 French into it percutaneous balloon retention gastrostomy tube was then lubricated and advanced through the peel-away sheath. The peel-away sheath was discarded.  The retention balloon was inflated with 6 mL saline and pulled snug against the anterior abdominal wall. The balloon retention gastrostomy tube was then secured with the external bumper and capped. A hand injection of contrast material was performed confirming that the tube is intragastric in location. One of the T tack retention sutures was cut. The second was left in place. The external bumper was secured to the anterior abdominal wall using 2 0 Prolene sutures. The patient will be observed for several hours with the newly placed tube on low wall suction to evaluate for any post procedure complication. The patient tolerated the procedure well, there is no immediate complication. IMPRESSION: Successful placement of a 5 French balloon retention percutaneous gastrostomy tube. Electronically Signed   By: Jacqulynn Cadet M.D.   On: 07/08/2019 16:21   NM PET Image Initial (PI) Skull Base To Thigh  Result Date: 07/05/2019 CLINICAL DATA:  Initial treatment strategy for esophageal cancer. EXAM: NUCLEAR MEDICINE PET SKULL BASE TO THIGH TECHNIQUE: 11.01 mCi F-18 FDG was injected intravenously. Full-ring PET imaging was performed from the skull base to thigh after the radiotracer. CT data was obtained and used for attenuation correction and anatomic localization. Fasting blood glucose: 118 mg/dl COMPARISON:  CT abdomen and pelvis 05/06/2019 FINDINGS: Mediastinal blood pool activity: SUV max 2.81 Liver activity: SUV max NA NECK: No hypermetabolic lymph nodes in the neck. Incidental CT findings: none CHEST: Large mass in the distal esophagus with marked increased FDG uptake. Masslike thickening is circumferential (SUVmax = 24.6) (image 103, series 3 and series 4) wall thickening as much as 3.6 cm. In total the esophagus at this level measures 7.5 x 5.2 cm. Multiple mediastinal lymph nodes beginning from just below the thoracic inlet also with lymph nodes in the retrocrural region and below the diaphragm. (Image 59,  series 3): High right paratracheal lymph node just below the thoracic inlet with rounded borders measuring 2.0 cm. (SUVmax = 28.1) Tiny lymph node just anterior to the aorta (image 91, series 4) difficult to visualize on CT (SUVmax = 7.1) Juxta aortic lymph node just to the left of long the anterior margin of the aorta (image 96, series 4): (SUVmax = 11 0.0) Small discrete foci of increased FDG uptake at the level of the esophageal hiatus, these appear distinct from the dominant mass, a small hepatic gastric lymph node (image 121, series 4) likely corresponds to the area of uptake seen just anterior to the aorta, perhaps with some variable respiration there is misregistration on the current study (SUVmax = 13.6 No area of increased FDG uptake within the lung parenchyma) Incidental CT findings: Lungs are clear aside from minimal scarring in the anterior right chest. No pleural effusion. Airways are patent. Calcified atheromatous plaque throughout the thoracic aorta. Calcified coronary artery disease. ABDOMEN/PELVIS: Left para-aortic lymph node with intense FDG uptake (image 135, series 4) 10 mm (SUVmax = 20.1) hepato gastric lymph node as described. No areas of  suspicious uptake within solid organs. There is an 8 mm right juxta crural lymph node (image 120, series 4) with similar uptake to the uptake seen in the hepato gastric lymph node. Incidental CT findings: CT appearance of liver, gallbladder, spleen, pancreas and adrenal glands is normal. Nonobstructing calculus in the upper pole the right kidney. No evidence of hydronephrosis. Left ureteral calculus measures 7 mm in the distal left ureter and is not associated with hydronephrosis, unchanged compared to previous imaging. No acute gastrointestinal finding. Appendix is normal. Stool fills much of the colon. SKELETON: No focal hypermetabolic activity to suggest skeletal metastasis. Incidental CT findings: Spinal degenerative changes. IMPRESSION: 1. Signs of  esophageal cancer with large esophageal mass with nodal involvement both above and below the diaphragm. 2. 7 mm distal left ureteral calculus is unchanged and does not lead to urinary tract obstruction. Electronically Signed   By: Zetta Bills M.D.   On: 07/05/2019 11:55    Microbiology: Recent Results (from the past 240 hour(s))  Respiratory Panel by RT PCR (Flu A&B, Covid) - Nasopharyngeal Swab     Status: None   Collection Time: 07/21/19  4:07 AM   Specimen: Nasopharyngeal Swab  Result Value Ref Range Status   SARS Coronavirus 2 by RT PCR NEGATIVE NEGATIVE Final    Comment: (NOTE) SARS-CoV-2 target nucleic acids are NOT DETECTED. The SARS-CoV-2 RNA is generally detectable in upper respiratoy specimens during the acute phase of infection. The lowest concentration of SARS-CoV-2 viral copies this assay can detect is 131 copies/mL. A negative result does not preclude SARS-Cov-2 infection and should not be used as the sole basis for treatment or other patient management decisions. A negative result may occur with  improper specimen collection/handling, submission of specimen other than nasopharyngeal swab, presence of viral mutation(s) within the areas targeted by this assay, and inadequate number of viral copies (<131 copies/mL). A negative result must be combined with clinical observations, patient history, and epidemiological information. The expected result is Negative. Fact Sheet for Patients:  PinkCheek.be Fact Sheet for Healthcare Providers:  GravelBags.it This test is not yet ap proved or cleared by the [place] FDA and  has been authorized for detection and/or diagnosis of SARS-CoV-2 by FDA under an Emergency Use Authorization (EUA). This EUA will remain  in effect (meaning this test can be used) for the duration of the COVID-19 declaration under Section 564(b)(1) of the Act, 21 U.S.C. section 360bbb-3(b)(1),  unless the authorization is terminated or revoked sooner.    Influenza A by PCR NEGATIVE NEGATIVE Final   Influenza B by PCR NEGATIVE NEGATIVE Final    Comment: (NOTE) The Xpert Xpress SARS-CoV-2/FLU/RSV assay is intended as an aid in  the diagnosis of influenza from Nasopharyngeal swab specimens and  should not be used as a sole basis for treatment. Nasal washings and  aspirates are unacceptable for Xpert Xpress SARS-CoV-2/FLU/RSV  testing. Fact Sheet for Patients: PinkCheek.be Fact Sheet for Healthcare Providers: GravelBags.it This test is not yet approved or cleared by the [place] FDA and  has been authorized for detection and/or diagnosis of SARS-CoV-2 by  FDA under an Emergency Use Authorization (EUA). This EUA will remain  in effect (meaning this test can be used) for the duration of the  Covid-19 declaration under Section 564(b)(1) of the Act, 21  U.S.C. section 360bbb-3(b)(1), unless the authorization is  terminated or revoked. Performed at [facility], [place] [address], [place], [place] [address]      Labs: Basic Metabolic  Panel: Recent Labs  Lab 07/21/19 0123 07/22/19 0211 07/23/19 0538 07/24/19 0556 07/25/19 0610  NA 139 141 141 139 138  K 4.1 4.2 3.9 4.0 3.7  CL 100 106 108 105 104  CO2 28 27 24 26 26   GLUCOSE 122* 120* 138* 136* 116*  BUN 31* 30* 23 17 14   CREATININE 1.41* 1.45* 1.27* 1.18 1.00  CALCIUM 11.3* 10.2 9.4 8.9 8.8*   Liver Function Tests: Recent Labs  Lab 07/21/19 0123 07/23/19 0538 07/24/19 0556 07/25/19 0610  AST 21 16 16 15   ALT 27 18 18 20   ALKPHOS 60 54 50 54  BILITOT 0.8 0.7 0.6 0.6  PROT 7.0 5.6* 5.4* 5.6*  ALBUMIN 3.8 2.9* 2.8* 2.9*   No results for input(s): LIPASE, AMYLASE in the last 168 hours. No results for input(s): AMMONIA in the last 168 hours. CBC: Recent Labs  Lab 07/21/19 0123 07/21/19 0945 07/21/19 1701 07/22/19 0211  07/23/19 0538 07/24/19 0556 07/25/19 0610  WBC 11.7*   < > 10.9* 9.4 8.5 7.7 6.8  NEUTROABS 9.4*  --   --   --   --   --   --   HGB 13.1   < > 12.0* 11.2* 10.7* 10.3* 10.9*  HCT 40.6   < > 37.6* 36.0* 33.5* 32.9* 34.4*  MCV 90.8   < > 92.2 96.0 92.5 92.9 91.7  PLT 328   < > 283 268 260 227 225   < > = values in this interval not displayed.    CBG: Recent Labs  Lab 07/22/19 1502  GLUCAP 95     Signed:  Oswald Hillock MD.  [facility] 07/25/2019, 1:37 PM

## 2019-07-25 NOTE — Progress Notes (Addendum)
HEMATOLOGY-ONCOLOGY PROGRESS NOTE  SUBJECTIVE: The patient reports that he feels "groggy" and "disoriented" this morning.  Denies abdominal pain.  No bleeding has been noted.  Wants to go back to sleep.  He has no new complaints this morning.  PHYSICAL EXAMINATION:  Vitals:   07/24/19 2022 07/25/19 0459  BP: 130/79 (!) 143/74  Pulse: 77 73  Resp: 16 17  Temp: 98.2 F (36.8 C) 98.1 F (36.7 C)  SpO2: 98% 96%   Filed Weights   07/21/19 0125 07/21/19 1312  Weight: 99.6 kg 98.2 kg    Intake/Output from previous day: 04/14 0701 - 04/15 0700 In: 3485.9 [I.V.:2225.9; NG/GT:300] Out: 1350 [NLZJQ:7341]  GENERAL:alert, no distress and comfortable ABDOMEN:abdomen soft, non-tender and normal bowel sounds. PEG tube site without erythema or drainage  LABORATORY DATA:  I have reviewed the data as listed CMP Latest Ref Rng & Units 07/25/2019 07/24/2019 07/23/2019  Glucose 70 - 99 mg/dL 116(H) 136(H) 138(H)  BUN 8 - 23 mg/dL _0 Creatinine 0.61 - 1.24 mg/dL 1.00 1.18 1.27(H)  Sodium 135 - 145 mmol/L 138 139 141  Potassium 3.5 - 5.1 mmol/L 3.7 4.0 3.9  Chloride 98 - 111 mmol/L 104 105 108  CO2 22 - 32 mmol/L _1 Calcium 8.9 - 10.3 mg/dL 8.8(L) 8.9 9.4  Total Protein 6.5 - 8.1 g/dL 5.6(L) 5.4(L) 5.6(L)  Total Bilirubin 0.3 - 1.2 mg/dL 0.6 0.6 0.7  Alkaline Phos 38 - 126 U/L 54 50 54  AST 15 - 41 U/L _2 ALT 0 - 44 U/L _3 Lab Results  Component Value Date   WBC 6.8 07/25/2019   HGB 10.9 (L) 07/25/2019   HCT 34.4 (L) 07/25/2019   MCV 91.7 07/25/2019   PLT 225 07/25/2019   NEUTROABS 9.4 (H) 07/21/2019    IR GASTROSTOMY TUBE MOD SED  Result Date: 07/08/2019 INDICATION: 74 year old male with obstructing esophageal mass and resultant dysphagia. EXAM: Fluoroscopically guided placement of percutaneous balloon retention gastrostomy tube Interventional Radiologist:  Criselda Peaches, MD MEDICATIONS: 2 g Ancef; Antibiotics were administered within 1 hour of the  procedure. ANESTHESIA/SEDATION: Versed 2 mg IV; Fentanyl 100 mcg IV Moderate Sedation Time:  16 minutes The patient was continuously monitored during the procedure by the interventional radiology nurse under my direct supervision. CONTRAST:  21m OMNIPAQUE IOHEXOL 300 MG/ML  SOLN FLUOROSCOPY TIME:  Fluoroscopy Time: 2 minutes 36 seconds (50 mGy). COMPLICATIONS: None immediate. PROCEDURE: Informed written consent was obtained from the patient after a thorough discussion of the procedural risks, benefits and alternatives. All questions were addressed. Maximal Sterile Barrier Technique was utilized including caps, mask, sterile gowns, sterile gloves, sterile drape, hand hygiene and skin antiseptic. A timeout was performed prior to the initiation of the procedure. Maximal barrier sterile technique utilized including caps, mask, sterile gowns, sterile gloves, large sterile drape, hand hygiene, and chlorhexadine skin prep. An angled catheter was advanced over a wire under fluoroscopic guidance through the nose, down the esophagus and into the body of the stomach. The stomach was then insufflated with several 100 ml of air. Fluoroscopy confirmed location of the gastric bubble, as well as inferior displacement of the barium stained colon. Under direct fluoroscopic guidance, two T-tacks were placed, and the anterior gastric wall drawn up against the anterior abdominal wall. Percutaneous access was then obtained into the mid gastric body with an 18 gauge needle. Aspiration of air, and injection of contrast material under fluoroscopy confirmed needle placement.  An Amplatz wire was advanced in the gastric body. The tract was then serially dilated to 82 [place] and an 18 [place] peel-away sheath was advanced into the stomach. A 16 French into it percutaneous balloon retention gastrostomy tube was then lubricated and advanced through the peel-away sheath. The peel-away sheath was discarded. The retention balloon was inflated with 6  mL saline and pulled snug against the anterior abdominal wall. The balloon retention gastrostomy tube was then secured with the external bumper and capped. A hand injection of contrast material was performed confirming that the tube is intragastric in location. One of the T tack retention sutures was cut. The second was left in place. The external bumper was secured to the anterior abdominal wall using 2 0 Prolene sutures. The patient will be observed for several hours with the newly placed tube on low wall suction to evaluate for any post procedure complication. The patient tolerated the procedure well, there is no immediate complication. IMPRESSION: Successful placement of a 72 French balloon retention percutaneous gastrostomy tube. Electronically Signed   By: Jacqulynn Cadet M.D.   On: 07/08/2019 16:21   NM PET Image Initial (PI) Skull Base To Thigh  Result Date: 07/05/2019 CLINICAL DATA:  Initial treatment strategy for esophageal cancer. EXAM: NUCLEAR MEDICINE PET SKULL BASE TO THIGH TECHNIQUE: 11.01 mCi F-18 FDG was injected intravenously. Full-ring PET imaging was performed from the skull base to thigh after the radiotracer. CT data was obtained and used for attenuation correction and anatomic localization. Fasting blood glucose: 118 mg/dl COMPARISON:  CT abdomen and pelvis 05/06/2019 FINDINGS: Mediastinal blood pool activity: SUV max 2.81 Liver activity: SUV max NA NECK: No hypermetabolic lymph nodes in the neck. Incidental CT findings: none CHEST: Large mass in the distal esophagus with marked increased FDG uptake. Masslike thickening is circumferential (SUVmax = 24.6) (image 103, series 3 and series 4) wall thickening as much as 3.6 cm. In total the esophagus at this level measures 7.5 x 5.2 cm. Multiple mediastinal lymph nodes beginning from just below the thoracic inlet also with lymph nodes in the retrocrural region and below the diaphragm. (Image 59, series 3): High right paratracheal lymph node  just below the thoracic inlet with rounded borders measuring 2.0 cm. (SUVmax = 28.1) Tiny lymph node just anterior to the aorta (image 91, series 4) difficult to visualize on CT (SUVmax = 7.1) Juxta aortic lymph node just to the left of long the anterior margin of the aorta (image 96, series 4): (SUVmax = 11 0.0) Small discrete foci of increased FDG uptake at the level of the esophageal hiatus, these appear distinct from the dominant mass, a small hepatic gastric lymph node (image 121, series 4) likely corresponds to the area of uptake seen just anterior to the aorta, perhaps with some variable respiration there is misregistration on the current study (SUVmax = 13.6 No area of increased FDG uptake within the lung parenchyma) Incidental CT findings: Lungs are clear aside from minimal scarring in the anterior right chest. No pleural effusion. Airways are patent. Calcified atheromatous plaque throughout the thoracic aorta. Calcified coronary artery disease. ABDOMEN/PELVIS: Left para-aortic lymph node with intense FDG uptake (image 135, series 4) 10 mm (SUVmax = 20.1) hepato gastric lymph node as described. No areas of suspicious uptake within solid organs. There is an 8 mm right juxta crural lymph node (image 120, series 4) with similar uptake to the uptake seen in the hepato gastric lymph node. Incidental CT findings: CT appearance of liver, gallbladder, spleen,  pancreas and adrenal glands is normal. Nonobstructing calculus in the upper pole the right kidney. No evidence of hydronephrosis. Left ureteral calculus measures 7 mm in the distal left ureter and is not associated with hydronephrosis, unchanged compared to previous imaging. No acute gastrointestinal finding. Appendix is normal. Stool fills much of the colon. SKELETON: No focal hypermetabolic activity to suggest skeletal metastasis. Incidental CT findings: Spinal degenerative changes. IMPRESSION: 1. Signs of esophageal cancer with large esophageal mass with  nodal involvement both above and below the diaphragm. 2. 7 mm distal left ureteral calculus is unchanged and does not lead to urinary tract obstruction. Electronically Signed   By: Zetta Bills M.D.   On: 07/05/2019 11:55    ASSESSMENT AND PLAN: 1. Esophagus cancer ? CT 05/06/2019-circumferential thickening of the distal esophagus, mild splenomegaly, bladder wall thickening, enlarged prostate ? Upper endoscopy 06/11/2019-ulcerated mass at the lower esophagus, 36 cm from the incisors, partially obstructing, biopsy revealed atypical cells-reactive change versus poorly differentiated neoplasm, outside consultation at [facility] on this biopsy and a repeat biopsy 06/21/2019 confirmed a poorly differentiated malignant neoplasm ? HER-2 negative, PD-L1 combined positive score -20 ? Repeat endoscopy 06/21/2019-partially obstructing esophageal tumor found in the lower third of the esophagus ? PET scan 07/05/2019-large esophageal mass with nodal involvement both above and below the diaphragm including a left periaortic node ? Palliative radiation starting 07/22/2019 2. Dementia 3. Anxiety/depression 4. Sleep apnea 5. BPH 6. Dysphagia secondary to #1  Gastrostomy tube placement 07/08/2019 7. Hyperlipidemia 8. Admission with hematemesis 07/21/2019  Mr. Kovalenko appears stable.  Hematemesis has now resolved.  Hemoglobin is currently stable/improved.  He continues on radiation under the care of Dr. Lisbeth Renshaw.  He is tolerating his tube feeds well.    Recommendations: 1.  Continue radiation under the care of Dr. Lisbeth Renshaw. 2.  Discussed with wife by telephone who is agreeable to take him home today. 3.  We will plan to initiate pembrolizumab upon completion of radiation. 4.   Continue gastrostomy tube feeding/hydration  Okay to discharge patient in our standpoint.  Wife agreeable to taking him home today.  We will see him as an outpatient as previously scheduled on 07/30/2019.   LOS: 4 days   David Bussing, DNP,  AGPCNP-BC, AOCNP 07/25/19 [name] appears unchanged when I saw him this morning.  The hemoglobin is stable.  He appears stable for discharge with the plan to continue outpatient radiation.  I discussed his current status and treatment plan with his wife by telephone.  She is in agreement with discharge to home.

## 2019-07-25 NOTE — Discharge Instructions (Signed)
Hematemesis Hematemesis is when you vomit blood. It is a sign of bleeding in the upper GI tract (gastrointestinal tract). The upper GI tract includes the mouth, throat, esophagus, stomach, and the upper part of the small intestine (duodenum). Hematemesis is usually caused by bleeding in the esophagus or stomach. You may suddenly vomit bright red blood. Or, the blood may look like coffee grounds. You may also have other symptoms, such as:  Stomach pain.  Heartburn.  Stool (feces) that looks black and tarry. Follow these instructions at home:   Take over-the-counter and prescription medicines only as told by your health care provider.  Do not take NSAIDs, including aspirin and ibuprofen, unless your health care provider approves. These medicines can increase bleeding.  Rest as needed.  Drink enough fluids to keep your urine pale yellow. Take small sips of fluid at a time.  Do not drink alcohol.  Do not use any products that contain nicotine or tobacco, such as cigarettes and e-cigarettes. If you need help quitting, ask your health care provider.  Keep all follow-up visits as told by your health care provider. This is important. Contact a health care provider if:  You have more blood in your vomit.  Your vomiting of blood begins again after it has stopped.  You have persistent stomach pain.  You have nausea, indigestion, or heartburn. Get help right away if:  You faint.  You feel weak or dizzy.  You are urinating less than normal or not at all.  You vomit up: ? Large amounts of blood or dark material that may look like coffee grounds. ? Bright red blood.  You have any of the following: ? Persistent vomiting. ? A rapid heartbeat. ? Blood in your stool. ? Chest pain. ? Difficulty breathing. These symptoms may represent a serious problem that is an emergency. Do not wait to see if the symptoms will go away. Get medical help right away. Call your local emergency services  (911 in the [place]). Do not drive yourself to the hospital. Summary  Hematemesis is when you vomit blood. It is a sign of bleeding in the upper GI tract (gastrointestinal tract).  Hematemesis is usually caused by bleeding in the esophagus or stomach.  Do not take NSAIDs (including aspirin and ibuprofen), drink alcohol, or use tobacco products.  Take over-the-counter and prescription medicines only as told by your health care provider. This information is not intended to replace advice given to you by your health care provider. Make sure you discuss any questions you have with your health care provider. Document Revised: 04/07/2017 Document Reviewed: 04/07/2017 Elsevier Patient Education  2020 [employer].

## 2019-07-26 ENCOUNTER — Ambulatory Visit: Payer: Medicare Other

## 2019-07-26 ENCOUNTER — Other Ambulatory Visit: Payer: Self-pay

## 2019-07-26 ENCOUNTER — Emergency Department (HOSPITAL_COMMUNITY)
Admission: EM | Admit: 2019-07-26 | Discharge: 2019-07-26 | Disposition: A | Discharge status: Home / Self Care | Payer: Medicare Other | Attending: Emergency Medicine | Admitting: Emergency Medicine

## 2019-07-26 ENCOUNTER — Other Ambulatory Visit: Payer: Self-pay | Admitting: Radiation Oncology

## 2019-07-26 ENCOUNTER — Encounter (HOSPITAL_COMMUNITY): Payer: Self-pay | Admitting: Emergency Medicine

## 2019-07-26 ENCOUNTER — Telehealth: Payer: Self-pay | Admitting: Nutrition

## 2019-07-26 ENCOUNTER — Ambulatory Visit
Admission: RE | Admit: 2019-07-26 | Discharge: 2019-07-26 | Disposition: A | Discharge status: Home / Self Care | Payer: Medicare Other | Source: Ambulatory Visit | Attending: Radiation Oncology | Admitting: Radiation Oncology

## 2019-07-26 DIAGNOSIS — R5383 Other fatigue: Secondary | ICD-10-CM

## 2019-07-26 DIAGNOSIS — I10 Essential (primary) hypertension: Secondary | ICD-10-CM | POA: Insufficient documentation

## 2019-07-26 DIAGNOSIS — Z79899 Other long term (current) drug therapy: Secondary | ICD-10-CM | POA: Insufficient documentation

## 2019-07-26 DIAGNOSIS — C155 Malignant neoplasm of lower third of esophagus: Secondary | ICD-10-CM

## 2019-07-26 DIAGNOSIS — F0390 Unspecified dementia without behavioral disturbance: Secondary | ICD-10-CM | POA: Diagnosis not present

## 2019-07-26 DIAGNOSIS — E860 Dehydration: Secondary | ICD-10-CM | POA: Insufficient documentation

## 2019-07-26 DIAGNOSIS — R42 Dizziness and giddiness: Secondary | ICD-10-CM | POA: Diagnosis present

## 2019-07-26 DIAGNOSIS — Z8501 Personal history of malignant neoplasm of esophagus: Secondary | ICD-10-CM | POA: Diagnosis not present

## 2019-07-26 DIAGNOSIS — R55 Syncope and collapse: Secondary | ICD-10-CM

## 2019-07-26 LAB — URINALYSIS, ROUTINE W REFLEX MICROSCOPIC
Bacteria, UA: NONE SEEN
Bilirubin Urine: NEGATIVE
Glucose, UA: NEGATIVE mg/dL
Ketones, ur: NEGATIVE mg/dL
Leukocytes,Ua: NEGATIVE
Nitrite: NEGATIVE
Protein, ur: NEGATIVE mg/dL
Specific Gravity, Urine: 1.014 (ref 1.005–1.030)
pH: 7 (ref 5.0–8.0)

## 2019-07-26 LAB — CBC WITH DIFFERENTIAL/PLATELET
Abs Immature Granulocytes: 0.05 10*3/uL (ref 0.00–0.07)
Basophils Absolute: 0 10*3/uL (ref 0.0–0.1)
Basophils Relative: 0 %
Eosinophils Absolute: 0 10*3/uL (ref 0.0–0.5)
Eosinophils Relative: 0 %
HCT: 36.5 % — ABNORMAL LOW (ref 39.0–52.0)
Hemoglobin: 12.1 g/dL — ABNORMAL LOW (ref 13.0–17.0)
Immature Granulocytes: 1 %
Lymphocytes Relative: 6 %
Lymphs Abs: 0.6 10*3/uL — ABNORMAL LOW (ref 0.7–4.0)
MCH: 29.5 pg (ref 26.0–34.0)
MCHC: 33.2 g/dL (ref 30.0–36.0)
MCV: 89 fL (ref 80.0–100.0)
Monocytes Absolute: 0.4 10*3/uL (ref 0.1–1.0)
Monocytes Relative: 5 %
Neutro Abs: 7.8 10*3/uL — ABNORMAL HIGH (ref 1.7–7.7)
Neutrophils Relative %: 88 %
Platelets: 284 10*3/uL (ref 150–400)
RBC: 4.1 MIL/uL — ABNORMAL LOW (ref 4.22–5.81)
RDW: 11.9 % (ref 11.5–15.5)
WBC: 8.9 10*3/uL (ref 4.0–10.5)
nRBC: 0 % (ref 0.0–0.2)

## 2019-07-26 LAB — COMPREHENSIVE METABOLIC PANEL WITH GFR
ALT: 17 U/L (ref 0–44)
AST: 20 U/L (ref 15–41)
Albumin: 3.2 g/dL — ABNORMAL LOW (ref 3.5–5.0)
Alkaline Phosphatase: 57 U/L (ref 38–126)
Anion gap: 9 (ref 5–15)
BUN: 15 mg/dL (ref 8–23)
CO2: 26 mmol/L (ref 22–32)
Calcium: 8.9 mg/dL (ref 8.9–10.3)
Chloride: 101 mmol/L (ref 98–111)
Creatinine, Ser: 1.08 mg/dL (ref 0.61–1.24)
GFR calc Af Amer: >60 mL/min
GFR calc non Af Amer: >60 mL/min
Glucose, Bld: 92 mg/dL (ref 70–99)
Potassium: 4.1 mmol/L (ref 3.5–5.1)
Sodium: 136 mmol/L (ref 135–145)
Total Bilirubin: 1.1 mg/dL (ref 0.3–1.2)
Total Protein: 6.1 g/dL — ABNORMAL LOW (ref 6.5–8.1)

## 2019-07-26 LAB — MAGNESIUM: Magnesium: 2.2 mg/dL (ref 1.7–2.4)

## 2019-07-26 LAB — TSH: TSH: 3.219 u[IU]/mL (ref 0.350–4.500)

## 2019-07-26 MED ORDER — SODIUM CHLORIDE 0.9 % IV BOLUS
1000.0000 mL | Freq: Once | INTRAVENOUS | Status: AC
  Administered 2019-07-26: 1000 mL via INTRAVENOUS

## 2019-07-26 MED ORDER — SONAFINE EX EMUL
1.0000 "application " | Freq: Once | CUTANEOUS | Status: AC
  Administered 2019-07-26: 1 via TOPICAL

## 2019-07-26 MED ORDER — KATE FARMS STANDARD 1.4 PO LIQD: ORAL | 6 refills | Status: DC

## 2019-07-26 MED ORDER — LORAZEPAM 2 MG/ML IJ SOLN
0.5000 mg | Freq: Once | INTRAMUSCULAR | Status: AC
  Administered 2019-07-26: 18:00:00 0.5 mg via INTRAVENOUS
  Filled 2019-07-26: qty 1

## 2019-07-26 NOTE — ED Notes (Signed)
Pt sitting up lying in bed watching t.v. full monitor on. Waiting on pt's wife for tube feeding for po challenge. Pt and family updated on plan of care. Will continue to monitor.

## 2019-07-26 NOTE — Progress Notes (Signed)
Pt here for patient teaching.  Pt given Radiation and You booklet and Sonafine.  Reviewed areas of pertinence such as fatigue, hair loss, skin changes and throat changes . Pt able to give teach back of to pat skin and use unscented/gentle soap,apply Sonafine bid and avoid applying anything to skin within 4 hours of treatment. Pt verbalizes understanding of information given and will contact nursing with any questions or concerns.     Claryssa Sandner M. Rexann Lueras RN, BSN      

## 2019-07-26 NOTE — Discharge Instructions (Signed)
Your history and exam today are consistent with getting lightheaded and near syncopal in the setting of dehydration, likely from missing the feeds yesterday.  As you have now proven that you are able to resume normal feeds and we gave you more fluids, we feel you are safe for discharge home.  Your labs were overall reassuring otherwise and we feel that you can follow-up with your primary doctor for the results of the thyroid test.  Please rest.  If any symptoms change or worsen, please return to the nearest emergency department.

## 2019-07-26 NOTE — Progress Notes (Signed)
Pt seen in clinic for under treat visit by Dr. Lisbeth Renshaw. He determined pt would benefit from IVF. Orders placed for 1L D5NS over 1 hour.     Carola Rhine, PAC

## 2019-07-26 NOTE — ED Provider Notes (Signed)
[facility] DEPT Provider Note   CSN: [id number] Arrival date & time: 07/26/19  1626     History Chief Complaint  Patient presents with  . Weakness    David [name] is a 74 y.o. male.  The history is provided by the patient and medical records. No language interpreter was used.  Dizziness Quality:  Lightheadedness Severity:  Moderate Onset quality:  Gradual Duration:  1 day Timing:  Constant Progression:  Unchanged Context: not with loss of consciousness   Worsened by:  Nothing Ineffective treatments:  None tried Associated symptoms: no chest pain, no diarrhea, no headaches, no nausea, no palpitations, no shortness of breath, no syncope, no vision changes and no vomiting        Past Medical History:  Diagnosis Date  . Anxiety   . BPH (benign prostatic hyperplasia)   . Cancer ([place])   . Dementia ([place])   . Depression   . Hyperlipidemia   . Hypertension   . Kidney stone   . Memory loss   . 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 Sleep apnea    CPAP  . Vitamin D deficiency     Patient Active Problem List   Diagnosis Date Noted  . Hematemesis 07/21/2019  . Dysphagia 07/05/2019  . Dementia without behavioral disturbance ([place]) 07/05/2019  . AKI (acute kidney injury) ([place]) 07/05/2019  . Esophageal cancer ([place]) 07/05/2019  . 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 Major depressive disorder, recurrent episode, severe ([place]) 10/28/2017  . Generalized anxiety disorder 10/28/2017  . Dyslipidemia 07/10/2012  . Metabolic syndrome 81/82/9937  . Prostatic hypertrophy 07/10/2012  . Testosterone deficiency 07/10/2012  . Essential hypertension 06/27/2011  . MDD (major depressive disorder), recurrent episode, mild ([place]) 05/16/2011    Past Surgical History:  Procedure Laterality Date  . CATARACT EXTRACTION, BILATERAL  2018  . EXTERNAL EAR SURGERY     child  . EYE SURGERY Bilateral    cataract  . IR  GASTROSTOMY TUBE MOD SED  07/08/2019  . right knee meniscus  06/12/2008  . right talus repair     dislocated       Family History  Problem Relation Age of Onset  . Alzheimer's disease Mother   . Depression Mother   . Cancer Father        prostate  . Thyroid disease Father   . Alzheimer's disease Maternal Grandmother   . Heart disease Maternal Grandfather   . Stroke Paternal Grandmother   . Stroke Paternal Grandfather   . Alcoholism Brother   . Autism Daughter   . Autism Maternal Uncle     Social History   Tobacco Use  . Smoking status: Never Smoker  . Smokeless tobacco: Never Used  Substance Use Topics  . Alcohol use: Yes    Alcohol/week: 1.0 - 2.0 standard drinks    Types: 1 - 2 Shots of liquor per week    Comment: 1-2 shots 4-5 x week  . Drug use: Yes    Types: Marijuana    Comment: Occasional    Home Medications Prior to Admission medications   Medication Sig Start Date End Date Taking? Authorizing Provider  buPROPion (WELLBUTRIN XL) 150 MG 24 hr tablet TAKE 1 TABLET ONCE DAILY. Patient taking differently: Take 150 mg by mouth daily.  07/18/19   [name], PMHNP  Cholecalciferol (VITAMIN D3 PO) Take 2,000 Units by mouth daily.    [provider]  donepezil (ARICEPT) 5 MG tablet  TAKE ONE TABLET AT BEDTIME. Patient taking differently: Take 5 mg by mouth at bedtime.  07/18/19   [name], PMHNP  escitalopram (LEXAPRO) 20 MG tablet TAKE 1 TABLET ONCE DAILY. Patient taking differently: Take 20 mg by mouth daily.  07/18/19   [name], PMHNP  esomeprazole (NEXIUM) 20 MG capsule Take 1 capsule (20 mg total) by mouth daily at 12 noon. 07/25/19 11/22/19  Oswald Hillock, MD  HYDROcodone-acetaminophen (NORCO/VICODIN) 5-325 MG tablet Take 1 tablet by mouth every 6 (six) hours as needed for moderate pain.    [provider]  LORazepam (ATIVAN) 0.5 MG tablet Take 1 tablet (0.5 mg total) by mouth every 6 (six) hours as needed for anxiety. 07/12/19    Georgette Shell, MD  memantine (NAMENDA) 10 MG tablet TAKE 1 TABLET BY MOUTH TWICE DAILY. Patient taking differently: Take 10 mg by mouth 2 (two) times daily.  07/18/19   [name], PMHNP  Nutritional Supplements (FEEDING SUPPLEMENT, KATE FARMS STANDARD 1.4,) LIQD liquid 4 per day (1 bolus, 3 feeding pump) 80 ml/hr X 12 hrs  1820 calories/day or 4 cans/day 07/26/19   Ladell Pier, MD  OLANZapine (ZYPREXA) 2.5 MG tablet Take 1 tablet (2.5 mg total) by mouth at bedtime. 07/17/19   [name], PMHNP  prochlorperazine (COMPAZINE) 5 MG tablet Take 1 tablet (5 mg total) by mouth every 6 (six) hours as needed for nausea or vomiting. May take orally or crush and take via tube. 07/16/19   Tanner, Lyndon Code., PA-C  simvastatin (ZOCOR) 40 MG tablet Take 40 mg by mouth daily.    [provider]  Tamsulosin HCl (FLOMAX) 0.4 MG CAPS Take 0.4 mg by mouth daily.     [provider]  vitamin B-12 (CYANOCOBALAMIN) 1000 MCG tablet Take 1,000 mcg by mouth daily.    [provider]    Allergies    Other, Abilify [aripiprazole], Lamictal [lamotrigine], and Wellbutrin [bupropion]  Review of Systems   Review of Systems  Constitutional: Positive for diaphoresis and fatigue. Negative for chills and fever.  HENT: Negative for congestion.   Eyes: Negative for visual disturbance.  Respiratory: Negative for chest tightness, shortness of breath, wheezing and stridor.   Cardiovascular: Negative for chest pain, palpitations, leg swelling and syncope.  Gastrointestinal: Negative for constipation, diarrhea, nausea and vomiting.  Genitourinary: Negative for dysuria and frequency.  Musculoskeletal: Negative for back pain, neck pain and neck stiffness.  Neurological: Positive for light-headedness. Negative for dizziness, syncope, facial asymmetry, numbness and headaches.  Psychiatric/Behavioral: Negative for agitation.  All other systems reviewed and are negative.   Physical  Exam Updated Vital Signs BP 138/90 (BP Location: Left Arm)   Pulse 68   Temp 97.8 F (36.6 C) (Oral)   Resp 18   SpO2 99%   Physical Exam Vitals and nursing note reviewed.  Constitutional:      General: He is not in acute distress.    Appearance: He is well-developed. He is not ill-appearing, toxic-appearing or diaphoretic.  HENT:     Head: Normocephalic and atraumatic.     Nose: Nose normal.     Mouth/Throat:     Mouth: Mucous membranes are dry.     Pharynx: No oropharyngeal exudate or posterior oropharyngeal erythema.  Eyes:     Extraocular Movements: Extraocular movements intact.     Conjunctiva/sclera: Conjunctivae normal.     Pupils: Pupils are equal, round, and reactive to light.  Cardiovascular:     Rate and Rhythm: Normal rate  and regular rhythm.     Pulses: Normal pulses.     Heart sounds: No murmur.  Pulmonary:     Effort: Pulmonary effort is normal. No respiratory distress.     Breath sounds: Normal breath sounds. No wheezing, rhonchi or rales.  Chest:     Chest wall: No tenderness.  Abdominal:     General: Abdomen is flat. There is no distension.     Palpations: Abdomen is soft.     Tenderness: There is no abdominal tenderness. There is no right CVA tenderness or left CVA tenderness.  Musculoskeletal:        General: No tenderness.     Cervical back: Neck supple.     Right lower leg: No edema.     Left lower leg: No edema.  Skin:    General: Skin is warm and dry.     Capillary Refill: Capillary refill takes less than 2 seconds.     Findings: No erythema.  Neurological:     General: No focal deficit present.     Mental Status: He is alert.     Sensory: No sensory deficit.     Motor: No weakness.  Psychiatric:        Mood and Affect: Mood normal.     ED Results / Procedures / Treatments   Labs (all labs ordered are listed, but only abnormal results are displayed) Labs Reviewed  CBC WITH DIFFERENTIAL/PLATELET - Abnormal; Notable for the following  components:      Result Value   RBC 4.10 (*)    Hemoglobin 12.1 (*)    HCT 36.5 (*)    Neutro Abs 7.8 (*)    Lymphs Abs 0.6 (*)    All other components within normal limits  COMPREHENSIVE METABOLIC PANEL - Abnormal; Notable for the following components:   Total Protein 6.1 (*)    Albumin 3.2 (*)    All other components within normal limits  URINALYSIS, ROUTINE W REFLEX MICROSCOPIC - Abnormal; Notable for the following components:   Hgb urine dipstick SMALL (*)    All other components within normal limits  URINE CULTURE  MAGNESIUM  TSH    EKG EKG Interpretation  Date/Time:  Friday July 26 2019 17:22:02 EDT Ventricular Rate:  73 PR Interval:    QRS Duration: 112 QT Interval:  403 QTC Calculation: 445 R Axis:   47 Text Interpretation: Sinus rhythm Incomplete right bundle branch block When compared to prior, no significant changes seen. NO STEMI Confirmed by Antony Blackbird [id number]) on 07/26/2019 5:29:24 PM   Radiology No results found.  Procedures Procedures (including critical care time)  Medications Ordered in ED Medications  sodium chloride 0.9 % bolus 1,000 mL (0 mLs Intravenous Stopped 07/26/19 1902)  LORazepam (ATIVAN) injection 0.5 mg (0.5 mg Intravenous Given 07/26/19 1816)    ED Course  I have reviewed the triage vital signs and the nursing notes.  Pertinent labs & imaging results that were available during my care of the patient were reviewed by me and considered in my medical decision making (see chart for details).    MDM Rules/Calculators/A&P                      Shaquil Aldana Haueter is a 74 y.o. 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  According to patient and wife, patient was discharged yesterday after  admission of the hospital.  He has received 5 radiation treatments for his cancer and the last being today.  She says that yesterday after discharge, patient did not receive his tube feed supplies and thus did not receive tube feeds overnight or this morning.  Patient did get a small amount of a different mixture before coming to the hospital for his radiation therapy.  According to wife, after his treatment, patient became lightheaded, diaphoretic, and was near syncopal.  He had to lay down and looked "gray" briefly.  They reported that there was no available beds in the cancer center to rehydrate the patient so he was into the emergency department for evaluation and further management.  Patient and wife report he has not had any new chest pain, shortness breath, palpitations.  He denies any nausea, vomiting, urinary changes or constipation or diarrhea.  She reports that discharge yesterday, he was 4 pounds heavier than he was on arrival today.  She is concerned that patient became too dehydrated as he was not fed with his tube feeds yesterday overnight he was supposed to.  Patient still feels fatigued and tired but denies any other complaints.  On exam, patient's abdomen is nontender and PEG tube appears in place with no surrounding tenderness.  Dressing was just changed today.  Lungs are clear and chest is nontender.  Patient moving all extremities.  Normal sensation and strength in all extremities.  Symmetric smile.  Normal extraocular movements present with normal pupil exam.  Patient just reports feeling fatigued and lightheaded.  Had a shared decision-making conversation with patient and wife.  We agreed to give the patient a liter of fluids to start with and check some basic labs to make sure he did not cause any significant electrolyte imbalance from missing his tube feeds overnight with his dehydration.  I suspect there was accommodation of vagal/orthostatic symptoms leading to the near syncopal  episode after his treatment.  Plan of care will be discharge patient if he feels better after hydration as labs are reassuring.  If not, will discuss further disposition options.  Work-up returned and was overall reassuring.  TSH was ordered but was not completed tonight.  They will follow up with PCP and MyChart for that result.  Otherwise, electrolytes were similar to prior and kidney function was similar.  Do not feel he sustained any organ injury in the setting of the dehydration and missing his feeds yesterday.  After fluids and getting a feed in the emergency department, we feel he is safe for discharge home.  He is feeling better.  Patient will be discharged to follow-up with his PCP and oncologic team.  Patient and family had no other questions or concerns and patient was discharged in good condition.   Final Clinical Impression(s) / ED Diagnoses Final diagnoses:  Fatigue, unspecified type  Near syncope  Dehydration    Rx / DC Orders ED Discharge Orders    None     Clinical Impression: 1. Fatigue, unspecified type   2. Near syncope   3. Dehydration     Disposition: Discharge  Condition: Good  I have discussed the results, Dx and Tx plan with the pt(& family if present). He/she/they expressed understanding and agree(s) with the plan. Discharge instructions discussed at great length. Strict return precautions discussed and pt &/or family have verbalized understanding of the instructions. No further questions at time of discharge.  New Prescriptions   No medications on file    Follow Up: Leighton Ruff, [place] [address] [facility] [address] [id number] [place] [phone]       Landri Dorsainvil, Gwenyth Allegra, MD 07/26/19 2101

## 2019-07-26 NOTE — ED Notes (Signed)
Pt sitting up in the bed. NAD noted. Pt ready for d/c with wife.

## 2019-07-26 NOTE — ED Triage Notes (Signed)
Patient was at cancer center and had his radiation trt, was getting ready to leave and he felt faint, endorsed feeling hot and sweaty. Recently discharged from the hospital. PEG tube.

## 2019-07-26 NOTE — Telephone Encounter (Signed)
Multiple phone calls with patient's wife, David [name], regarding patient's tube feeding. Patient was d/c from hospital without TF orders for [facility] 1.4. [facility] attempted to assist, however does not have this TF in stock so it would need to be ordered. Wife refuses to use O[employer] 1.5 which she has at home. I contacted [employer] who has samples of [employer] 1.4. Representative graciously offered to deliver them to patient's home so he would have TF over the weekend. Orders written and faxed to [facility] by RN Navigator for TF and supplies. [employer] notified and will arrange to have pump picked up next week after [employer] delivers new pump. Wife was notified of all information.

## 2019-07-26 NOTE — Progress Notes (Signed)
Received call from [employer] that patient was discharged from the hospital yesterday but does not have the new formula - [facility] and [facility] does not have it.  I have corresponded with Ernestene Kiel [facility] Dietician and she also received a call from patient's wife.  She has found out the [employer] does have [employer].  I have placed orders for the new formula, faxed new orders, demographics, insurance card information, hospital discharge summary and hospital dietician's note to [facility] to fax [phone]. Received confirmation fax went through.    Ernestene Kiel has already called [name] Difrancesco back to let her know that her and I have been working on this and it has been taken care of.

## 2019-07-26 NOTE — ED Notes (Signed)
Pt lying in bed. Wife at bedside for tube feeding. Will check back in. NAD noted.

## 2019-07-27 LAB — URINE CULTURE: Culture: NO GROWTH

## 2019-07-29 ENCOUNTER — Inpatient Hospital Stay (HOSPITAL_BASED_OUTPATIENT_CLINIC_OR_DEPARTMENT_OTHER): Payer: Medicare Other | Admitting: Medical

## 2019-07-29 ENCOUNTER — Ambulatory Visit: Payer: Medicare Other

## 2019-07-29 ENCOUNTER — Ambulatory Visit
Admission: RE | Admit: 2019-07-29 | Discharge: 2019-07-29 | Disposition: A | Discharge status: Home / Self Care | Payer: Medicare Other | Source: Ambulatory Visit | Attending: Radiation Oncology | Admitting: Radiation Oncology

## 2019-07-29 ENCOUNTER — Other Ambulatory Visit: Payer: Self-pay

## 2019-07-29 VITALS — BP 141/76 | HR 74 | Temp 98.0°F | Resp 18 | Ht 74.0 in | Wt 223.2 lb

## 2019-07-29 DIAGNOSIS — C155 Malignant neoplasm of lower third of esophagus: Secondary | ICD-10-CM

## 2019-07-29 NOTE — Progress Notes (Signed)
Pt seen by PA Van only, no assessment by [facility] RN at this time.  PA Van aware. 

## 2019-07-29 NOTE — Progress Notes (Signed)
Patient's wife calls stating the where the sutures are at his G-tube look red and inflamed and there is some drainage.  She is concerned it is infected.  I set up an appointment with Sandi Mealy PA at 3:30 to be seen after his RT to be evaluated and she is aware.  She also is inquiring about the 8 hours of in home care she was told they could get upon discharge from the hospital.  I have called Alvis Lemmings, [place] [facility] and [place] and no one has a record.  I have faxed his insurance information to [facility] which provides private duty nursing to see if his insurance will cover PDN.  They will get back to me.

## 2019-07-30 ENCOUNTER — Other Ambulatory Visit: Payer: Self-pay

## 2019-07-30 ENCOUNTER — Inpatient Hospital Stay: Payer: Medicare Other | Admitting: Oncology

## 2019-07-30 ENCOUNTER — Telehealth: Payer: Self-pay | Admitting: Nutrition

## 2019-07-30 ENCOUNTER — Ambulatory Visit
Admission: RE | Admit: 2019-07-30 | Discharge: 2019-07-30 | Disposition: A | Discharge status: Home / Self Care | Payer: Medicare Other | Source: Ambulatory Visit | Attending: Radiation Oncology | Admitting: Radiation Oncology

## 2019-07-30 ENCOUNTER — Ambulatory Visit: Payer: Medicare Other

## 2019-07-30 VITALS — BP 115/70 | HR 94 | Temp 98.2°F | Resp 17 | Ht 74.0 in | Wt 221.3 lb

## 2019-07-30 DIAGNOSIS — F329 Major depressive disorder, single episode, unspecified: Secondary | ICD-10-CM | POA: Insufficient documentation

## 2019-07-30 DIAGNOSIS — C155 Malignant neoplasm of lower third of esophagus: Secondary | ICD-10-CM | POA: Insufficient documentation

## 2019-07-30 DIAGNOSIS — F0390 Unspecified dementia without behavioral disturbance: Secondary | ICD-10-CM | POA: Insufficient documentation

## 2019-07-30 DIAGNOSIS — N400 Enlarged prostate without lower urinary tract symptoms: Secondary | ICD-10-CM | POA: Diagnosis not present

## 2019-07-30 DIAGNOSIS — R1310 Dysphagia, unspecified: Secondary | ICD-10-CM | POA: Insufficient documentation

## 2019-07-30 DIAGNOSIS — F419 Anxiety disorder, unspecified: Secondary | ICD-10-CM | POA: Insufficient documentation

## 2019-07-30 DIAGNOSIS — E785 Hyperlipidemia, unspecified: Secondary | ICD-10-CM | POA: Diagnosis not present

## 2019-07-30 DIAGNOSIS — Z7189 Other specified counseling: Secondary | ICD-10-CM

## 2019-07-30 DIAGNOSIS — G4730 Sleep apnea, unspecified: Secondary | ICD-10-CM | POA: Insufficient documentation

## 2019-07-30 NOTE — Progress Notes (Signed)
  [facility] OFFICE PROGRESS NOTE   Diagnosis: Esophagus cancer  INTERVAL HISTORY:   David [name] returns for a scheduled visit.  He was discharged in the hospital 07/25/2019 after admission with hematemesis.  No further hematemesis.  No rectal bleeding.  He continues daily radiation.  No odynophagia.  He complains of malaise.  His swallowing has partially improved.  He has tolerated liquids for the past few days.  He continues gastrostomy tube feedings.  He is here today with his wife.  Objective:  Vital signs in last 24 hours:  Blood pressure 115/70, pulse 94, temperature 98.2 F (36.8 C), temperature source Temporal, resp. rate 17, height _0  (1.88 m), weight 221 lb 4.8 oz (100.4 kg), SpO2 98 %.    Resp: Distant breath sounds, clear bilaterally, no respiratory distress Cardio: Regular rate and rhythm GI: Soft, no hepatomegaly, left upper quadrant gastrostomy tube site without evidence of infection Vascular: No leg edema  Skin: No rash  Portacath/PICC-without erythema  Lab Results:  Lab Results  Component Value Date   WBC 8.9 07/26/2019   HGB 12.1 (L) 07/26/2019   HCT 36.5 (L) 07/26/2019   MCV 89.0 07/26/2019   PLT 284 07/26/2019   NEUTROABS 7.8 (H) 07/26/2019    CMP  Lab Results  Component Value Date   NA 136 07/26/2019   K 4.1 07/26/2019   CL 101 07/26/2019   CO2 26 07/26/2019   GLUCOSE 92 07/26/2019   BUN 15 07/26/2019   CREATININE 1.08 07/26/2019   CALCIUM 8.9 07/26/2019   PROT 6.1 (L) 07/26/2019   ALBUMIN 3.2 (L) 07/26/2019   AST 20 07/26/2019   ALT 17 07/26/2019   ALKPHOS 57 07/26/2019   BILITOT 1.1 07/26/2019   GFRNONAA >60 07/26/2019   GFRAA >60 07/26/2019     Medications: I have reviewed the patient's current medications.   Assessment/Plan: 1. Esophagus cancer ? CT 05/06/2019-circumferential thickening of the distal esophagus, mild splenomegaly, bladder wall thickening, enlarged prostate ? Upper endoscopy 06/11/2019-ulcerated  mass at the lower esophagus, 36 cm from the incisors, partially obstructing, biopsy revealed atypical cells-reactive change versus poorly differentiated neoplasm, outside consultation at [facility] on this biopsy and a repeat biopsy 06/21/2019 confirmed a poorly differentiated malignant neoplasm ? HER-2 negative, PD-L1 combined positive score -20 ? Repeat endoscopy 06/21/2019-partially obstructing esophageal tumor found in the lower third of the esophagus ? PET scan 07/05/2019-large esophageal mass with nodal involvement both above and below the diaphragm including a left periaortic node ? Palliative radiation to the esophagus mass starting 07/22/2019 2. Dementia 3. Anxiety/depression 4. Sleep apnea 5. BPH 6. Dysphagia secondary to #1  Gastrostomy tube placement 07/08/2019 7. Hyperlipidemia 8. Admission with hematemesis 07/21/2019   Disposition: Mr. Shafran appears stable.  He is tolerating the radiation well.  Hopefully the dysphagia will improve over the next 1-2 weeks.  He will continue liquids as tolerated and tube feedings.  The plan is to begin pembrolizumab therapy at the completion of radiation.  We reviewed potential toxicities associated with pembrolizumab including the chance of a rash, diarrhea, pneumonitis, hepatitis, and other autoimmune toxicities.  He agrees to proceed.  He will attend a chemotherapy teaching class.  He will be scheduled for an office visit and cycle 1 pembrolizumab on 08/12/2019.  Ms. Gorden will contact us in the interim if he develops new symptoms.  Betsy Coder, MD  07/30/2019  4:37 PM

## 2019-07-30 NOTE — Telephone Encounter (Signed)
I received a telephone call from [facility] requesting clarification on free water flushes.  Recommended patient receive 240 mL free water 4 times daily between feedings.  In addition tube should be flushed with 60 mL of free water before and after continuous/bolus feedings.  They will clarify with patient's wife.

## 2019-07-30 NOTE — Progress Notes (Signed)
Spoke with Nicki with [facility] and she stated that PT is scheduled to go out to the home to do an evaluation.  I explained that the patient has experienced a significant decline in his condition and that the wife is asking if he he would qualify for SNF placement.  She will add this to the note for PT and she will fax me their evaluation when complete. I have let [name] Hyser (wife) know.

## 2019-07-31 ENCOUNTER — Other Ambulatory Visit: Payer: Self-pay

## 2019-07-31 ENCOUNTER — Ambulatory Visit
Admission: RE | Admit: 2019-07-31 | Discharge: 2019-07-31 | Disposition: A | Discharge status: Home / Self Care | Payer: Medicare Other | Source: Ambulatory Visit | Attending: Radiation Oncology | Admitting: Radiation Oncology

## 2019-07-31 ENCOUNTER — Ambulatory Visit: Payer: Medicare Other

## 2019-07-31 DIAGNOSIS — C155 Malignant neoplasm of lower third of esophagus: Secondary | ICD-10-CM | POA: Diagnosis not present

## 2019-07-31 NOTE — Progress Notes (Signed)
The patient was seen with his ex-wife today.  She was concerned that he could an area of infection around his PEG to wear he has had to sutures in place.  The area was examined.  There was slight erythema around the site of the sutures but no increased warmth or purulent discharge from either site.  They were reassured that this is likely simply due to the irritation from the sutures.  The patient's ex-wife was also concerned that there was a lesion in his right groin.  She was examined and it was noted that this was simply a seborrheic keratosis.  They were reassured that this was not an area of malignancy nor would it become a malignancy.  The patient's wife was reassured that there was nothing to be concerned about in either instance.  She expressed understanding and agreement.  Sandi Mealy, MHS, PA-C Physician Assistant

## 2019-08-01 ENCOUNTER — Ambulatory Visit
Admission: RE | Admit: 2019-08-01 | Discharge: 2019-08-01 | Disposition: A | Discharge status: Home / Self Care | Payer: Medicare Other | Source: Ambulatory Visit | Attending: Radiation Oncology | Admitting: Radiation Oncology

## 2019-08-01 ENCOUNTER — Telehealth: Payer: Self-pay | Admitting: Psychiatry

## 2019-08-01 ENCOUNTER — Other Ambulatory Visit: Payer: Self-pay

## 2019-08-01 ENCOUNTER — Ambulatory Visit: Payer: Medicare Other

## 2019-08-01 ENCOUNTER — Telehealth: Payer: Self-pay | Admitting: Diagnostic Neuroimaging

## 2019-08-01 DIAGNOSIS — C155 Malignant neoplasm of lower third of esophagus: Secondary | ICD-10-CM | POA: Diagnosis not present

## 2019-08-01 NOTE — Progress Notes (Signed)
David [name] calls with concern about the patient's ongoing complaint of dizziness which was present prior to his cancer diagnosis.  I consulted with Dr. Benay Spice he feels it is probably coming from his medications.  I reviewed with David Paganini he is currently taking Zyprexa, Aricept, Namenda, Ativan and Flomax which all cause dizziness.  Also that he needs to increase his fluids.  She plans to speak to [name] (psych) and his neurologist to see if he can discontinue any of these medications.

## 2019-08-01 NOTE — Telephone Encounter (Signed)
Patient's wife called and said that he is increased dizziness. She wants to talk to you about the cepraxa dose and cutting it in half and wants to stop all together. All the doctors feel it is his psychiatric meds. Please give leslie a call at [phone]

## 2019-08-01 NOTE — Telephone Encounter (Signed)
Ok to stop donepezil. Would continue memantine for now. -VRP

## 2019-08-01 NOTE — Telephone Encounter (Addendum)
Called wife and informed her Dr Leta Baptist advised to stop donepezil, but he recommends to continue memantine. She asked for rational,  stated oncologist has recommended he stop due to dizziness side effects. patient has lost weight, just completed radiation therapy.Marland Kitchen He'll begin immunotherapy soon.  Discussed with Dr Leta Baptist who stated he may stop both medicines. I advised wife who verbalized understanding, appreciation. She asked for [facility] to be called : I told her I'll call.  Spoke with Ronalee Belts and advised him to stop refills on donepezil and memantine. Ronalee Belts  verbalized understanding, appreciation.

## 2019-08-01 NOTE — Telephone Encounter (Addendum)
[name],David(wife on DPR) has called to inform that Pt has a diagnosis of cancer, a lot has taken place since pt was last seen.  Wife states ht pt has been feeling very dizzy and she would like a call from RN to discuss pt coming off of memantine (NAMENDA) 10 MG tablet  & donepezil (ARICEPT) 5 MG tablet.  Wife has now asked that Dr Leta Baptist calls her on this matter  Please call

## 2019-08-02 ENCOUNTER — Ambulatory Visit
Admission: RE | Admit: 2019-08-02 | Discharge: 2019-08-02 | Disposition: A | Discharge status: Home / Self Care | Payer: Medicare Other | Source: Ambulatory Visit | Attending: Radiation Oncology | Admitting: Radiation Oncology

## 2019-08-02 ENCOUNTER — Ambulatory Visit: Payer: Medicare Other

## 2019-08-02 ENCOUNTER — Other Ambulatory Visit: Payer: Self-pay

## 2019-08-02 DIAGNOSIS — C155 Malignant neoplasm of lower third of esophagus: Secondary | ICD-10-CM | POA: Diagnosis not present

## 2019-08-02 NOTE — Telephone Encounter (Signed)
Returned call to [employer]. She reports that his fatigue briefly improved after decrease in Olanzapine and then returned. She reports that he has been experiencing excessive somnolence and severe dizziness. She reports that his mood and anxiety s/s have been well controlled overall, with the exception of when he is anxious to lay down in bed. Pt has not needed Ativan prn recently.   Plan: Stop Olanzapine since he has tolerated decrease without adverse effects.   Patient advised to contact office with any questions, adverse effects, or acute worsening in signs and symptoms.

## 2019-08-02 NOTE — Telephone Encounter (Signed)
Pharmacy aware

## 2019-08-05 ENCOUNTER — Other Ambulatory Visit: Payer: Self-pay

## 2019-08-05 ENCOUNTER — Ambulatory Visit: Payer: Medicare Other

## 2019-08-05 ENCOUNTER — Emergency Department (HOSPITAL_COMMUNITY): Payer: Medicare Other

## 2019-08-05 ENCOUNTER — Ambulatory Visit: Admission: RE | Admit: 2019-08-05 | Payer: Medicare Other | Source: Ambulatory Visit

## 2019-08-05 ENCOUNTER — Telehealth: Payer: Self-pay | Admitting: Oncology

## 2019-08-05 ENCOUNTER — Emergency Department (HOSPITAL_COMMUNITY)
Admission: EM | Admit: 2019-08-05 | Discharge: 2019-08-05 | Disposition: A | Discharge status: Home / Self Care | Payer: Medicare Other | Source: Home / Self Care | Attending: Emergency Medicine | Admitting: Emergency Medicine

## 2019-08-05 ENCOUNTER — Encounter (HOSPITAL_COMMUNITY): Payer: Self-pay

## 2019-08-05 ENCOUNTER — Encounter: Payer: Self-pay | Admitting: *Deleted

## 2019-08-05 DIAGNOSIS — R55 Syncope and collapse: Secondary | ICD-10-CM | POA: Diagnosis not present

## 2019-08-05 DIAGNOSIS — R1110 Vomiting, unspecified: Secondary | ICD-10-CM

## 2019-08-05 DIAGNOSIS — R112 Nausea with vomiting, unspecified: Secondary | ICD-10-CM | POA: Insufficient documentation

## 2019-08-05 DIAGNOSIS — R42 Dizziness and giddiness: Secondary | ICD-10-CM

## 2019-08-05 DIAGNOSIS — I10 Essential (primary) hypertension: Secondary | ICD-10-CM | POA: Insufficient documentation

## 2019-08-05 DIAGNOSIS — F0390 Unspecified dementia without behavioral disturbance: Secondary | ICD-10-CM | POA: Insufficient documentation

## 2019-08-05 DIAGNOSIS — I951 Orthostatic hypotension: Secondary | ICD-10-CM | POA: Diagnosis not present

## 2019-08-05 DIAGNOSIS — Z79899 Other long term (current) drug therapy: Secondary | ICD-10-CM | POA: Insufficient documentation

## 2019-08-05 LAB — URINALYSIS, ROUTINE W REFLEX MICROSCOPIC
Bilirubin Urine: NEGATIVE
Glucose, UA: NEGATIVE mg/dL
Hgb urine dipstick: NEGATIVE
Ketones, ur: NEGATIVE mg/dL
Leukocytes,Ua: NEGATIVE
Nitrite: NEGATIVE
Protein, ur: NEGATIVE mg/dL
Specific Gravity, Urine: 1.018 (ref 1.005–1.030)
pH: 7 (ref 5.0–8.0)

## 2019-08-05 LAB — CBC
HCT: 43.4 % (ref 39.0–52.0)
Hemoglobin: 14.2 g/dL (ref 13.0–17.0)
MCH: 29.3 pg (ref 26.0–34.0)
MCHC: 32.7 g/dL (ref 30.0–36.0)
MCV: 89.5 fL (ref 80.0–100.0)
Platelets: 194 10*3/uL (ref 150–400)
RBC: 4.85 MIL/uL (ref 4.22–5.81)
RDW: 13.1 % (ref 11.5–15.5)
WBC: 8.1 10*3/uL (ref 4.0–10.5)
nRBC: 0 % (ref 0.0–0.2)

## 2019-08-05 LAB — BASIC METABOLIC PANEL
Anion gap: 10 (ref 5–15)
BUN: 18 mg/dL (ref 8–23)
CO2: 22 mmol/L (ref 22–32)
Calcium: 9.7 mg/dL (ref 8.9–10.3)
Chloride: 101 mmol/L (ref 98–111)
Creatinine, Ser: 1.02 mg/dL (ref 0.61–1.24)
GFR calc Af Amer: >60 mL/min (ref >60)
GFR calc non Af Amer: >60 mL/min (ref >60)
Glucose, Bld: 117 mg/dL — ABNORMAL HIGH (ref 70–99)
Potassium: 4.3 mmol/L (ref 3.5–5.1)
Sodium: 133 mmol/L — ABNORMAL LOW (ref 135–145)

## 2019-08-05 LAB — CBG MONITORING, ED: Glucose-Capillary: 113 mg/dL — ABNORMAL HIGH (ref 70–99)

## 2019-08-05 MED ORDER — ONDANSETRON HCL 4 MG/2ML IJ SOLN
4.0000 mg | Freq: Once | INTRAMUSCULAR | Status: AC
  Administered 2019-08-05: 4 mg via INTRAVENOUS
  Filled 2019-08-05: qty 2

## 2019-08-05 MED ORDER — IOHEXOL 300 MG/ML  SOLN
50.0000 mL | Freq: Once | INTRAMUSCULAR | Status: AC | PRN
  Administered 2019-08-05: 50 mL

## 2019-08-05 MED ORDER — SODIUM CHLORIDE 0.9% FLUSH: 3.0000 mL | Freq: Once | INTRAVENOUS | Status: DC

## 2019-08-05 MED ORDER — LORAZEPAM 2 MG/ML IJ SOLN: 1.0000 mg | Freq: Once | INTRAMUSCULAR | Status: DC

## 2019-08-05 NOTE — ED Notes (Signed)
Dr Eulis Foster made aware of heart rate dropping prior to emesis.

## 2019-08-05 NOTE — ED Triage Notes (Signed)
Pt present at [facility] for radiation treatment.  C/o intermittent dizziness, feeling faint, and feeling sweaty.  Hx of CA w/ peg tube.  Pt has completed 10 radiation treatments.  Unable to get treatment today.  CBG 136.  A & Ox4.    Wife reports this happens "every day."  Pt is typically ambulatory.

## 2019-08-05 NOTE — Discharge Instructions (Addendum)
There is no clear cause found for the spells that he is having.  Continue current treatments at home.  Call the cardiology office for follow-up appointment since he has had some periods of bradycardia.  Talk to your PCP, and Dr. Malachy Mood to get help with determining the source of the problem.  Return here, if needed.

## 2019-08-05 NOTE — ED Provider Notes (Signed)
[facility] DEPT Provider Note   CSN: [id number] Arrival date & time: 08/05/19  1529     History Chief Complaint  Patient presents with  . CA Pt  . Diaphoretic  . Dizziness    David [name] is a 74 y.o. male.  HPI Patient states he is here because he is "disoriented," and describes not knowing who he has, where he is or what day it is.  He states he has noticed this himself.  Today he was at radiation oncology to be treated for his esophageal cancer.  He is followed by Dr. Ammie Dalton, oncologist, and has a history of dementia, anxiety, depression.  Patient's wife gives history that he has had intermittent longstanding periods of sweating, and dizziness.  Today he was waiting for the radiation treatment and was diaphoretic.  Staff there contacted the oncology service, who directed him here for evaluation.  Patient's feeding tube has been working well according to his wife.  He has not had recent illnesses including fever, cough, trouble breathing, difficulty walking.  There are no other known modifying factors.    Past Medical History:  Diagnosis Date  . Anxiety   . BPH (benign prostatic hyperplasia)   . Cancer ([place])   . Dementia ([place])   . Depression   . Hyperlipidemia   . Hypertension   . Kidney stone   . Memory loss   . 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 Sleep apnea    CPAP  . Vitamin D deficiency     Patient Active Problem List   Diagnosis Date Noted  . Goals of care, counseling/discussion 07/30/2019  . Hematemesis 07/21/2019  . Dysphagia 07/05/2019  . Dementia without behavioral disturbance ([place]) 07/05/2019  . AKI (acute kidney injury) ([place]) 07/05/2019  . Esophageal cancer ([place]) 07/05/2019  . Primary cancer of lower third of esophagus ([place]) 07/04/2019  . 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 Generalized anxiety disorder 10/28/2017  . Dyslipidemia 07/10/2012  . Metabolic syndrome  28/31/5176  . Prostatic hypertrophy 07/10/2012  . Testosterone deficiency 07/10/2012  . Essential hypertension 06/27/2011  . MDD (major depressive disorder), recurrent episode, mild ([place]) 05/16/2011    Past Surgical History:  Procedure Laterality Date  . CATARACT EXTRACTION, BILATERAL  2018  . EXTERNAL EAR SURGERY     child  . EYE SURGERY Bilateral    cataract  . IR GASTROSTOMY TUBE MOD SED  07/08/2019  . right knee meniscus  06/12/2008  . right talus repair     dislocated       Family History  Problem Relation Age of Onset  . Alzheimer's disease Mother   . Depression Mother   . Cancer Father        prostate  . Thyroid disease Father   . Alzheimer's disease Maternal Grandmother   . Heart disease Maternal Grandfather   . Stroke Paternal Grandmother   . Stroke Paternal Grandfather   . Alcoholism Brother   . Autism Daughter   . Autism Maternal Uncle     Social History   Tobacco Use  . Smoking status: Never Smoker  . Smokeless tobacco: Never Used  Substance Use Topics  . Alcohol use: Yes    Alcohol/week: 1.0 - 2.0 standard drinks    Types: 1 - 2 Shots of liquor per week    Comment: 1-2 shots 4-5 x week  . Drug use: Yes    Types: Marijuana    Comment: Occasional    Home  Medications Prior to Admission medications   Medication Sig Start Date End Date Taking? Authorizing Provider  buPROPion (WELLBUTRIN XL) 150 MG 24 hr tablet TAKE 1 TABLET ONCE DAILY. Patient taking differently: Take 150 mg by mouth daily.  07/18/19  Yes [name], PMHNP  Cholecalciferol (VITAMIN D3 PO) Take 2,000 Units by mouth daily.   Yes [provider]  escitalopram (LEXAPRO) 20 MG tablet TAKE 1 TABLET ONCE DAILY. Patient taking differently: Take 20 mg by mouth daily.  07/18/19  Yes [name], PMHNP  esomeprazole (NEXIUM) 20 MG packet Take 20 mg by mouth daily. 07/25/19  Yes [provider]  HYDROcodone-acetaminophen (NORCO/VICODIN) 5-325 MG tablet Take 1 tablet by  mouth every 6 (six) hours as needed for moderate pain.   Yes [provider]  LORazepam (ATIVAN) 0.5 MG tablet Take 1 tablet (0.5 mg total) by mouth every 6 (six) hours as needed for anxiety. 07/12/19  Yes Georgette Shell, MD  Nutritional Supplements (FEEDING SUPPLEMENT, KATE FARMS STANDARD 1.4,) LIQD liquid 4 per day (1 bolus, 3 feeding pump) 80 ml/hr X 12 hrs  1820 calories/day or 4 cans/day 07/26/19  Yes Ladell Pier, MD  prochlorperazine (COMPAZINE) 5 MG tablet Take 1 tablet (5 mg total) by mouth every 6 (six) hours as needed for nausea or vomiting. May take orally or crush and take via tube. 07/16/19  Yes Tanner, Lyndon Code., PA-C  simvastatin (ZOCOR) 40 MG tablet Take 40 mg by mouth daily.   Yes [provider]  Tamsulosin HCl (FLOMAX) 0.4 MG CAPS Take 0.4 mg by mouth at bedtime.    Yes [provider]  vitamin B-12 (CYANOCOBALAMIN) 1000 MCG tablet Take 1,000 mcg by mouth daily.   Yes [provider]  donepezil (ARICEPT) 5 MG tablet TAKE ONE TABLET AT BEDTIME. Patient not taking: No sig reported 07/18/19   [name], PMHNP  memantine (NAMENDA) 10 MG tablet TAKE 1 TABLET BY MOUTH TWICE DAILY. Patient not taking: No sig reported 07/18/19   [name], PMHNP    Allergies    Other, Abilify [aripiprazole], and Lamictal [lamotrigine]  Review of Systems   Review of Systems  All other systems reviewed and are negative.   Physical Exam Updated Vital Signs BP (!) 143/92   Pulse 73   Temp 98.2 F (36.8 C) (Oral)   Resp 19   Ht 6\' 2"  (1.88 m)   Wt 100.2 kg   SpO2 100%   BMI 28.37 kg/m   Physical Exam Vitals and nursing note reviewed.  Constitutional:      Appearance: He is well-developed.  HENT:     Head: Normocephalic and atraumatic.     Right Ear: External ear normal.     Left Ear: External ear normal.  Eyes:     Conjunctiva/sclera: Conjunctivae normal.     Pupils: Pupils are equal, round, and reactive to light.  Neck:     Trachea:  Phonation normal.  Cardiovascular:     Rate and Rhythm: Normal rate and regular rhythm.     Heart sounds: Normal heart sounds.  Pulmonary:     Effort: Pulmonary effort is normal.     Breath sounds: Normal breath sounds.  Abdominal:     Palpations: Abdomen is soft.     Tenderness: There is no abdominal tenderness.  Musculoskeletal:        General: Normal range of motion.     Cervical back: Normal range of motion and neck supple.  Skin:    General:  Skin is warm and dry.  Neurological:     Mental Status: He is alert and oriented to person, place, and time.     Cranial Nerves: No cranial nerve deficit.     Sensory: No sensory deficit.     Motor: No abnormal muscle tone.     Coordination: Coordination normal.  Psychiatric:        Behavior: Behavior normal.        Thought Content: Thought content normal.        Judgment: Judgment normal.     ED Results / Procedures / Treatments   Labs (all labs ordered are listed, but only abnormal results are displayed) Labs Reviewed  BASIC METABOLIC PANEL - Abnormal; Notable for the following components:      Result Value   Sodium 133 (*)    Glucose, Bld 117 (*)    All other components within normal limits  URINALYSIS, ROUTINE W REFLEX MICROSCOPIC - Abnormal; Notable for the following components:   APPearance HAZY (*)    All other components within normal limits  CBG MONITORING, ED - Abnormal; Notable for the following components:   Glucose-Capillary 113 (*)    All other components within normal limits  CBC    EKG EKG Interpretation  Date/Time:  Monday August 05 2019 16:00:15 EDT Ventricular Rate:  82 PR Interval:    QRS Duration: 110 QT Interval:  391 QTC Calculation: 457 R Axis:   35 Text Interpretation: Sinus rhythm RSR' in V1 or V2, right VCD or RVH since last tracing no significant change Confirmed by Daleen Bo [phone]) on 08/05/2019 7:55:15 PM   Radiology DG ABDOMEN PEG TUBE LOCATION  Result Date: 08/05/2019 CLINICAL  DATA:  Peg tube evaluation. EXAM: ABDOMEN - 1 VIEW COMPARISON:  IR gastrostomy placement 07/08/2019 FINDINGS: A single frontal view of the abdomen is provided. A PEG tube balloon is seen in the left upper quadrant projecting over the stomach. There is contrast within the stomach and proximal duodenum. No evidence of extraluminal contrast. The bowel gas pattern is nonobstructive. IMPRESSION: PEG tube balloon projects over the stomach. Contrast is seen within the stomach and proximal duodenum. No evidence of extraluminal contrast. Electronically Signed   By: Audie Pinto M.D.   On: 08/05/2019 18:06    Procedures Procedures (including critical care time)  Medications Ordered in ED Medications  sodium chloride flush (NS) 0.9 % injection 3 mL (3 mLs Intravenous Not Given 08/05/19 1700)  ondansetron (ZOFRAN) injection 4 mg (4 mg Intravenous Given 08/05/19 1946)  iohexol (OMNIPAQUE) 300 MG/ML solution 50 mL (50 mLs Per Tube Contrast Given 08/05/19 1800)    ED Course  I have reviewed the triage vital signs and the nursing notes.  Pertinent labs & imaging results that were available during my care of the patient were reviewed by me and considered in my medical decision making (see chart for details).  Clinical Course as of Aug 05 1453  Mon Aug 05, 2019  1955 Per radiologist, normal functioning PEG tube.  DG ABDOMEN PEG TUBE LOCATION [EW]  2140 Normal except sodium low, glucose high,  Basic metabolic panel(!) [EW]  6433 Normal  Urinalysis, Routine w reflex microscopic(!) [EW]  2140 Normal  CBC [EW]  2140 Mild elevation  CBG monitoring, ED(!) [EW]  Tue Aug 06, 2019  1453 Nitrite: NEGATIVE [EW]    Clinical Course User Index [EW] Daleen Bo, MD   MDM Rules/Calculators/A&P  Patient Vitals for the past 24 hrs:  BP Temp Temp src Pulse Resp SpO2 Height Weight  08/05/19 2100 (!) 143/92 -- -- 73 19 100 % -- --  08/05/19 2030 123/77 -- -- 81 18 99 % -- --  08/05/19  2000 128/85 -- -- 71 20 99 % -- --  08/05/19 1949 (!) 151/91 -- -- 84 (!) 22 99 % -- --  08/05/19 1900 127/83 -- -- 73 16 98 % -- --  08/05/19 1630 137/84 -- -- 74 17 99 % -- --  08/05/19 1600 133/88 -- -- 83 15 98 % -- --  08/05/19 1545 -- -- -- 75 15 100 % -- --  08/05/19 1544 -- -- -- -- -- -- 6\' 2"  (1.88 m) 100.2 kg  08/05/19 1543 137/84 98.2 F (36.8 C) Oral 72 17 100 % -- --  08/05/19 1531 -- -- -- -- -- 99 % -- --    9:44 PM Reevaluation with update and discussion. After initial assessment and treatment, an updated evaluation reveals findings discussed with patient and wife, they are agreeable to going home, all questions answered. Daleen Bo   Medical Decision Making:  This patient is presenting for evaluation of disorientation with spells of dizziness, which does require a range of treatment options, and is a complaint that involves a moderate risk of morbidity and mortality. The differential diagnoses include CVA, systemic infection, metabolic instability. I decided  to review old records, and in summary he is currently receiving chemotherapy for esophageal cancer.  He has a feeding tube, which has been functioning normally.  He has ongoing spells of dizziness for several months.  He was unable to complete radiation treatment today because of the dizzy episode.. I obtained additional historical information from his wife at the bedside. Clinical Laboratory Tests Ordered, included metabolic panel, CBC, urinalysis.,  Labs reviewed and are reassuring.  No acute abnormalities requiring intervention. Radiologic Tests Ordered, included KUB with contrast instillation into the feeding tub. I independently Visualized: Radiographic image, which show normal functioning G-tube without signs of intestinal obstruction   Critical Interventions-clinical evaluation, laboratory testing, contrast study abdominal film to evaluate G-tube, observation   After These Interventions, the Patient was  reevaluated and was found stable for discharge.  Ongoing dizzy spells of nonspecific nature, most likely related to vagal stimulation.  He has Teah, short-lived, and self resolved.   associated periods of bradycardia doubt acute unstable cardiac condition.  CRITICAL CARE-no Performed by: Daleen Bo  Nursing Notes Reviewed/ Care Coordinated Applicable Imaging Reviewed Interpretation of Laboratory Data incorporated into ED treatment  The patient appears reasonably screened and/or stabilized for discharge and I doubt any other medical condition or other [facility] requiring further screening, evaluation, or treatment in the ED at this time prior to discharge.  Plan: Home Medications-continue usual; Home Treatments-rest fluids; return here if the recommended treatment, does not improve the symptoms; Recommended follow up-oncology follow-up as soon as possible for further management  Final Clinical Impression(s) / ED Diagnoses Final diagnoses:  Vomiting  Dizzy spells    Rx / DC Orders ED Discharge Orders    None       Daleen Bo, MD 08/06/19 1456

## 2019-08-05 NOTE — ED Notes (Signed)
Pt became suddenly diaphoretic and dizzy.  Wife reports heart rate dropped to 40s and Pt began vomiting.  Heart rate is now in the 120s.

## 2019-08-05 NOTE — ED Notes (Signed)
Dr. Wentz at bedside. 

## 2019-08-05 NOTE — ED Notes (Signed)
Patient's wife upset about how long her husband's visit is going. This RN explained to patient and family which labs we are waiting for and notified Dr. Eulis Foster that the patient's wife would like to speak with him.

## 2019-08-05 NOTE — Telephone Encounter (Signed)
Scheduled per los. Called and spoke with patient and spouse. Confirmed appts. Changed primary contact per patient request

## 2019-08-06 ENCOUNTER — Encounter: Payer: Self-pay | Admitting: Nurse Practitioner

## 2019-08-06 ENCOUNTER — Observation Stay (HOSPITAL_COMMUNITY): Payer: Medicare Other

## 2019-08-06 ENCOUNTER — Ambulatory Visit: Payer: Medicare Other

## 2019-08-06 ENCOUNTER — Inpatient Hospital Stay (HOSPITAL_COMMUNITY)
Admission: AD | Admit: 2019-08-06 | Discharge: 2019-08-13 | DRG: 312 | Disposition: A | Discharge status: Rehabilitation Facility | Payer: Medicare Other | Source: Ambulatory Visit | Attending: Internal Medicine | Admitting: Internal Medicine

## 2019-08-06 ENCOUNTER — Inpatient Hospital Stay: Payer: Medicare Other | Admitting: Nurse Practitioner

## 2019-08-06 ENCOUNTER — Other Ambulatory Visit: Payer: Self-pay

## 2019-08-06 VITALS — BP 133/88 | HR 82 | Temp 98.0°F | Resp 16

## 2019-08-06 DIAGNOSIS — F0390 Unspecified dementia without behavioral disturbance: Secondary | ICD-10-CM | POA: Diagnosis present

## 2019-08-06 DIAGNOSIS — I951 Orthostatic hypotension: Secondary | ICD-10-CM | POA: Diagnosis not present

## 2019-08-06 DIAGNOSIS — R55 Syncope and collapse: Secondary | ICD-10-CM | POA: Diagnosis present

## 2019-08-06 DIAGNOSIS — Z9842 Cataract extraction status, left eye: Secondary | ICD-10-CM

## 2019-08-06 DIAGNOSIS — C155 Malignant neoplasm of lower third of esophagus: Secondary | ICD-10-CM | POA: Diagnosis present

## 2019-08-06 DIAGNOSIS — R161 Splenomegaly, not elsewhere classified: Secondary | ICD-10-CM | POA: Diagnosis present

## 2019-08-06 DIAGNOSIS — Z79899 Other long term (current) drug therapy: Secondary | ICD-10-CM

## 2019-08-06 DIAGNOSIS — G4730 Sleep apnea, unspecified: Secondary | ICD-10-CM | POA: Diagnosis present

## 2019-08-06 DIAGNOSIS — Z820 Family history of epilepsy and other diseases of the nervous system: Secondary | ICD-10-CM

## 2019-08-06 DIAGNOSIS — F411 Generalized anxiety disorder: Secondary | ICD-10-CM | POA: Diagnosis present

## 2019-08-06 DIAGNOSIS — I10 Essential (primary) hypertension: Secondary | ICD-10-CM | POA: Diagnosis present

## 2019-08-06 DIAGNOSIS — F332 Major depressive disorder, recurrent severe without psychotic features: Secondary | ICD-10-CM | POA: Diagnosis present

## 2019-08-06 DIAGNOSIS — Z8249 Family history of ischemic heart disease and other diseases of the circulatory system: Secondary | ICD-10-CM

## 2019-08-06 DIAGNOSIS — Z6828 Body mass index (BMI) 28.0-28.9, adult: Secondary | ICD-10-CM

## 2019-08-06 DIAGNOSIS — Z818 Family history of other mental and behavioral disorders: Secondary | ICD-10-CM

## 2019-08-06 DIAGNOSIS — Z811 Family history of alcohol abuse and dependence: Secondary | ICD-10-CM

## 2019-08-06 DIAGNOSIS — Z87442 Personal history of urinary calculi: Secondary | ICD-10-CM

## 2019-08-06 DIAGNOSIS — Z9841 Cataract extraction status, right eye: Secondary | ICD-10-CM

## 2019-08-06 DIAGNOSIS — R1310 Dysphagia, unspecified: Secondary | ICD-10-CM | POA: Diagnosis present

## 2019-08-06 DIAGNOSIS — Z8349 Family history of other endocrine, nutritional and metabolic diseases: Secondary | ICD-10-CM

## 2019-08-06 DIAGNOSIS — N400 Enlarged prostate without lower urinary tract symptoms: Secondary | ICD-10-CM | POA: Diagnosis present

## 2019-08-06 DIAGNOSIS — Z823 Family history of stroke: Secondary | ICD-10-CM

## 2019-08-06 DIAGNOSIS — Z20822 Contact with and (suspected) exposure to covid-19: Secondary | ICD-10-CM | POA: Diagnosis present

## 2019-08-06 DIAGNOSIS — E785 Hyperlipidemia, unspecified: Secondary | ICD-10-CM | POA: Diagnosis present

## 2019-08-06 DIAGNOSIS — E440 Moderate protein-calorie malnutrition: Secondary | ICD-10-CM | POA: Diagnosis present

## 2019-08-06 LAB — COMPREHENSIVE METABOLIC PANEL
ALT: 13 U/L (ref 0–44)
AST: 18 U/L (ref 15–41)
Albumin: 3.8 g/dL (ref 3.5–5.0)
Alkaline Phosphatase: 58 U/L (ref 38–126)
Anion gap: 10 (ref 5–15)
BUN: 18 mg/dL (ref 8–23)
CO2: 20 mmol/L — ABNORMAL LOW (ref 22–32)
Calcium: 9.8 mg/dL (ref 8.9–10.3)
Chloride: 104 mmol/L (ref 98–111)
Creatinine, Ser: 1.1 mg/dL (ref 0.61–1.24)
GFR calc Af Amer: >60 mL/min (ref >60)
GFR calc non Af Amer: >60 mL/min (ref >60)
Glucose, Bld: 100 mg/dL — ABNORMAL HIGH (ref 70–99)
Potassium: 4.1 mmol/L (ref 3.5–5.1)
Sodium: 134 mmol/L — ABNORMAL LOW (ref 135–145)
Total Bilirubin: 1.4 mg/dL — ABNORMAL HIGH (ref 0.3–1.2)
Total Protein: 6.7 g/dL (ref 6.5–8.1)

## 2019-08-06 LAB — CBC
HCT: 39 % (ref 39.0–52.0)
Hemoglobin: 12.9 g/dL — ABNORMAL LOW (ref 13.0–17.0)
MCH: 29.7 pg (ref 26.0–34.0)
MCHC: 33.1 g/dL (ref 30.0–36.0)
MCV: 89.7 fL (ref 80.0–100.0)
Platelets: 190 10*3/uL (ref 150–400)
RBC: 4.35 MIL/uL (ref 4.22–5.81)
RDW: 13.2 % (ref 11.5–15.5)
WBC: 8.9 10*3/uL (ref 4.0–10.5)
nRBC: 0 % (ref 0.0–0.2)

## 2019-08-06 LAB — CBG MONITORING, ED: Glucose-Capillary: 136 mg/dL — ABNORMAL HIGH (ref 70–99)

## 2019-08-06 MED ORDER — ACETAMINOPHEN 325 MG PO TABS: 650.0000 mg | ORAL_TABLET | Freq: Four times a day (QID) | ORAL | Status: DC | PRN

## 2019-08-06 MED ORDER — ACETAMINOPHEN 650 MG RE SUPP: 650.0000 mg | Freq: Four times a day (QID) | RECTAL | Status: DC | PRN

## 2019-08-06 MED ORDER — ONDANSETRON HCL 4 MG PO TABS: 4.0000 mg | ORAL_TABLET | Freq: Four times a day (QID) | ORAL | Status: DC | PRN

## 2019-08-06 MED ORDER — KATE FARMS STANDARD 1.4 PO LIQD
960.0000 mL | ORAL | Status: DC
  Administered 2019-08-06: 960 mL
  Filled 2019-08-06: qty 975

## 2019-08-06 MED ORDER — ONDANSETRON HCL 4 MG/2ML IJ SOLN
4.0000 mg | Freq: Four times a day (QID) | INTRAMUSCULAR | Status: DC | PRN
  Administered 2019-08-07 – 2019-08-13 (×3): 4 mg via INTRAVENOUS
  Filled 2019-08-06 (×3): qty 2

## 2019-08-06 MED ORDER — SODIUM CHLORIDE 0.9 % IV SOLN
Freq: Once | INTRAVENOUS | Status: AC
  Filled 2019-08-06: qty 250

## 2019-08-06 MED ORDER — SODIUM CHLORIDE 0.9% FLUSH
3.0000 mL | Freq: Two times a day (BID) | INTRAVENOUS | Status: DC
  Administered 2019-08-06 – 2019-08-13 (×8): 3 mL via INTRAVENOUS

## 2019-08-06 MED ORDER — ENOXAPARIN SODIUM 40 MG/0.4ML ~~LOC~~ SOLN
40.0000 mg | SUBCUTANEOUS | Status: DC
  Administered 2019-08-06 – 2019-08-12 (×7): 40 mg via SUBCUTANEOUS
  Filled 2019-08-06 (×7): qty 0.4

## 2019-08-06 MED ORDER — SODIUM CHLORIDE 0.9 % IV SOLN: INTRAVENOUS | Status: DC

## 2019-08-06 NOTE — Progress Notes (Addendum)
[place]   [phone] [phone]   Clinic Follow up Note   Patient Care Team: Leighton Ruff, MD as PCP - General (Family Medicine) Braden, Danny Lawless, DO as Referring Physician (Student) Jonnie Finner, RN as Oncology Nurse Navigator 08/06/2019  CHIEF COMPLAINT: Esophagus cancer, f/u ED visit 08/05/19   CURRENT THERAPY: Palliative radiation starting 4/12, plan to complete 4/29  INTERVAL HISTORY: David [name] presents for follow up after visiting the ED on 08/05/19 for dizziness and diaphoresis. His wife also reporting periods of low heart rate. Pulse was monitored in ED in 70-80's. EKG showed sinus rhythm. CBG 113-136, BMP normal except Na 133, CBC normal, UA essentially normal with hazy appearance. Abdominal film showed PEG tube in position.   Today he presents in his wheelchair. He reports feeling disoriented and confused which is not new. The feeling of not knowing what day it is is scary. He has intermittent dizziness since diagnosis which is worsening. Yesterday while waiting for radiation he became dizzy, diaphoretic, and pale per David [name]. His radiation was canceled and was referred to ED from clinic. She reports he did not receive fluids. Per David [name], heart rate dropped in to the 40's, then gradually came up to 70's prior to vomiting. He vomited again this morning clear fluid; does not usually have n/v. He took zofran yesterday and compazine today which he did not keep down. Denies constipation, diarrhea. No recent rectal bleeding or hematemesis. Denies fever, chills, cough, chest pain, dyspnea. He is not on BP or meds to control heart rate. He stopped aricept, namenda, and olanzapine in the last week, otherwise not on new medication. Denies pain. His activity level is low, he is up mostly for bathroom and ADLs, has nurse aid at home. He is doing tube feeds for 12 hours overnight then a bolus at 2 PM. He tries to drink water but not optimal. Drinks some  soup. He thinks swallowing is improving on radiation.   Most of ROS obtained by David [name]    MEDICAL HISTORY:  Past Medical History:  Diagnosis Date  . Anxiety   . BPH (benign prostatic hyperplasia)   . Cancer ([place])   . Dementia ([place])   . Depression   . Hyperlipidemia   . Hypertension   . Kidney stone   . Memory loss   . 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 Sleep apnea    CPAP  . Vitamin D deficiency     SURGICAL HISTORY: Past Surgical History:  Procedure Laterality Date  . CATARACT EXTRACTION, BILATERAL  2018  . EXTERNAL EAR SURGERY     child  . EYE SURGERY Bilateral    cataract  . IR GASTROSTOMY TUBE MOD SED  07/08/2019  . right knee meniscus  06/12/2008  . right talus repair     dislocated    I have reviewed the social history and family history with the patient and they are unchanged from previous note.  ALLERGIES:  is allergic to other; abilify [aripiprazole]; and lamictal [lamotrigine].  MEDICATIONS:  Current Outpatient Medications  Medication Sig Dispense Refill  . buPROPion (WELLBUTRIN XL) 150 MG 24 hr tablet TAKE 1 TABLET ONCE DAILY. (Patient taking differently: Take 150 mg by mouth daily. ) 28 tablet 0  . Cholecalciferol (VITAMIN D3 PO) Take 2,000 Units by mouth daily.    Marland Kitchen donepezil (ARICEPT) 5 MG tablet TAKE ONE TABLET AT BEDTIME. (Patient not taking: No sig reported) 28 tablet 0  . escitalopram (LEXAPRO) 20 MG  tablet TAKE 1 TABLET ONCE DAILY. (Patient taking differently: Take 20 mg by mouth daily. ) 28 tablet 0  . esomeprazole (NEXIUM) 20 MG packet Take 20 mg by mouth daily.    Marland Kitchen HYDROcodone-acetaminophen (NORCO/VICODIN) 5-325 MG tablet Take 1 tablet by mouth every 6 (six) hours as needed for moderate pain.    Marland Kitchen LORazepam (ATIVAN) 0.5 MG tablet Take 1 tablet (0.5 mg total) by mouth every 6 (six) hours as needed for anxiety. 30 tablet 0  . memantine (NAMENDA) 10 MG tablet TAKE 1 TABLET BY MOUTH TWICE DAILY. (Patient not taking: No sig  reported) 56 tablet 0  . Nutritional Supplements (FEEDING SUPPLEMENT, KATE FARMS STANDARD 1.4,) LIQD liquid 4 per day (1 bolus, 3 feeding pump) 80 ml/hr X 12 hrs  1820 calories/day or 4 cans/day 1300 mL 6  . prochlorperazine (COMPAZINE) 5 MG tablet Take 1 tablet (5 mg total) by mouth every 6 (six) hours as needed for nausea or vomiting. May take orally or crush and take via tube. 45 tablet 0  . simvastatin (ZOCOR) 40 MG tablet Take 40 mg by mouth daily.    . Tamsulosin HCl (FLOMAX) 0.4 MG CAPS Take 0.4 mg by mouth at bedtime.     . vitamin B-12 (CYANOCOBALAMIN) 1000 MCG tablet Take 1,000 mcg by mouth daily.     No current facility-administered medications for this visit.    PHYSICAL EXAMINATION:  Vitals:   08/06/19 1457 08/06/19 1458  BP: 121/77 97/62  Pulse: 85 (!) 110  Resp: 18   Temp: 98 F (36.7 C)   SpO2: 99%     GENERAL:alert, appears comfortable  SKIN: pale and diaphoretic  EYES:  sclera clear OROPHARYNX: moist mucus membranes  NECK: without mass. No JVD LUNGS: clear with normal breathing effort HEART: irregular rhythm, no murmurs. No lower extremity edema ABDOMEN: abdomen soft, non-tender and normal bowel sounds. G tube in place, no erythema or drainage  NEURO: oriented to person and place with fluent speech, mild generalized weakness Peripheral IV right forearm   LABORATORY DATA:  I have reviewed the data as listed CBC Latest Ref Rng & Units 08/05/2019 07/26/2019 07/25/2019  WBC 4.0 - 10.5 K/uL 8.1 8.9 6.8  Hemoglobin 13.0 - 17.0 g/dL 14.2 12.1(L) 10.9(L)  Hematocrit 39.0 - 52.0 % 43.4 36.5(L) 34.4(L)  Platelets 150 - 400 K/uL 194 284 225     CMP Latest Ref Rng & Units 08/05/2019 07/26/2019 07/25/2019  Glucose 70 - 99 mg/dL 117(H) 92 116(H)  BUN 8 - 23 mg/dL _0 Creatinine 0.61 - 1.24 mg/dL 1.02 1.08 1.00  Sodium 135 - 145 mmol/L 133(L) 136 138  Potassium 3.5 - 5.1 mmol/L 4.3 4.1 3.7  Chloride 98 - 111 mmol/L 101 101 104  CO2 22 - 32 mmol/L _1 Calcium 8.9 - 10.3 mg/dL 9.7 8.9 8.8(L)  Total Protein 6.5 - 8.1 g/dL - 6.1(L) 5.6(L)  Total Bilirubin 0.3 - 1.2 mg/dL - 1.1 0.6  Alkaline Phos 38 - 126 U/L - 57 54  AST 15 - 41 U/L - 20 15  ALT 0 - 44 U/L - 17 20      RADIOGRAPHIC STUDIES: I have personally reviewed the radiological images as listed and agreed with the findings in the report. DG ABDOMEN PEG TUBE LOCATION  Result Date: 08/05/2019 CLINICAL DATA:  Peg tube evaluation. EXAM: ABDOMEN - 1 VIEW COMPARISON:  IR gastrostomy placement 07/08/2019 FINDINGS: A single frontal view of the abdomen is provided. A  PEG tube balloon is seen in the left upper quadrant projecting over the stomach. There is contrast within the stomach and proximal duodenum. No evidence of extraluminal contrast. The bowel gas pattern is nonobstructive. IMPRESSION: PEG tube balloon projects over the stomach. Contrast is seen within the stomach and proximal duodenum. No evidence of extraluminal contrast. Electronically Signed   By: Audie Pinto M.D.   On: 08/05/2019 18:06     ASSESSMENT & PLAN:   1. Esophagus cancer ? CT 05/06/2019-circumferential thickening of the distal esophagus, mild splenomegaly, bladder wall thickening, enlarged prostate ? Upper endoscopy 06/11/2019-ulcerated mass at the lower esophagus, 36 cm from the incisors, partially obstructing, biopsy revealed atypical cells-reactive change versus poorly differentiated neoplasm, outside consultation at [facility] on this biopsy and a repeat biopsy 06/21/2019 confirmed a poorly differentiated malignant neoplasm ? HER-2 negative, PD-L1 combined positive score -20 ? Repeat endoscopy 06/21/2019-partially obstructing esophageal tumor found in the lower third of the esophagus ? PET scan 07/05/2019-large esophageal mass with nodal involvement both above and below the diaphragm including a left periaortic node ? Palliative radiation to the esophagus mass starting  07/22/2019 2. Dementia 3. Anxiety/depression 4. Sleep apnea 5. BPH 6. Dysphagia secondary to #1  Gastrostomy tube placement 07/08/2019 7. Hyperlipidemia 8. Admission with hematemesis 07/21/2019 9. Admission for dehydration and pre-syncope in clinic 08/06/19   Disposition:  Mr. Tippetts appears stable. He continues palliative radiation to the esophagus, he has 3 treatments remaining. He presents today with n/v, worsening dizziness, episodes of diaphoresis and irregular hart rhythm. He appears dehydrated. We started intravenous fluids in clinic. We are referring him for hospital admission for dehydration and witnessed pre-syncope in clinic. His case was accepted by Dr. Tyrell Antonio of the hospitalist team, he is pending an admission to a telemetry bed for cardiac monitoring. The patient was seen with Dr. Benay Spice today who will also follow while inpatient. Radiation oncology has been made aware of his pending admission.   All questions were answered. The patient knows to call the clinic with any problems, questions or concerns. No barriers to learning was detected.     Alla Feeling, NP 08/06/19   This was a shared visit with Cira Rue.  Mr. Gin was interviewed and examined.  He has experienced episodes of dizziness, presyncope, and diaphoresis over the past several days.  His wife noted his heart rate was low on the monitor in the emergency room yesterday.  He was noted to have orthostatic blood pressure and pulse changes in the office today.  When I was examining him in a sitting position the heart rate initially increased with an irregular rhythm and then was difficult to hear.  He became less responsive and diaphoretic.  He became alert when he went back to the supine position.  It is possible his symptoms are related to dehydration.  We began intravenous hydration at the Cancer center today.  I recommend hospital admission for observation, hydration, and evaluation for underlying cardiac  disease.  Chest radiation will be held today.  Julieanne Manson, MD

## 2019-08-06 NOTE — Progress Notes (Signed)
Patient's ex wife calls stating that he was taken to the ED yesterday afternoon due to heart rate dropping, becoming diaphoretic and what seemed like seconds of unresponsiveness.  This morning he has been vomiting clear/bile.  Spoke with Dr. Benay Spice and informed her to bring him in to be seen at 1:00 today by Cira Rue NP.  She verbalized an understanding.

## 2019-08-06 NOTE — Progress Notes (Signed)
Pharmacist Chemotherapy Monitoring - Initial Assessment    Anticipated start date: 08/12/19   Regimen:  . Are orders appropriate based on the patient's diagnosis, regimen, and cycle? Yes . Does the plan date match the patient's scheduled date? Yes . Is the sequencing of drugs appropriate? Yes . Are the premedications appropriate for the patient's regimen? Yes . Prior Authorization for treatment is: Not Started o If applicable, is the correct biosimilar selected based on the patient's insurance? not applicable  Organ Function and Labs: Marland Kitchen Are dose adjustments needed based on the patient's renal function, hepatic function, or hematologic function? No . Are appropriate labs ordered prior to the start of patient's treatment? Yes . Other organ system assessment, if indicated: N/A . The following baseline labs, if indicated, have been ordered: pembrolizumab: baseline TSH +/- T4  Dose Assessment: . Are the drug doses appropriate? Yes . Are the following correct: o Drug concentrations Yes o IV fluid compatible with drug Yes o Administration routes Yes o Timing of therapy Yes . If applicable, does the patient have documented access for treatment and/or plans for port-a-cath placement? no . If applicable, have lifetime cumulative doses been properly documented and assessed? not applicable  Toxicity Monitoring/Prevention: . The patient has the following take home antiemetics prescribed: Prochlorperazine . The patient has the following take home medications prescribed: N/A . Medication allergies and previous infusion related reactions, if applicable, have been reviewed and addressed. Yes . The patient's current medication list has been assessed for drug-drug interactions with their chemotherapy regimen. no significant drug-drug interactions were identified on review.  Order Review: . Are the treatment plan orders signed? No . Is the patient scheduled to see a provider prior to their treatment?  Yes  I verify that I have reviewed each item in the above checklist and answered each question accordingly.   Kennith Center, Pharm.D., CPP 08/06/2019@1 :46 PM

## 2019-08-06 NOTE — Progress Notes (Signed)
@   1755 transported patient to room #1508 via w/c in good condition with his blue tote bag and fannie pack. Report provided to nurse Hinton Dyer, RN. IV fluids were stopped and IV site in right hand was capped off after saline flush.

## 2019-08-06 NOTE — H&P (Signed)
History and Physical   David [name] NWG:[id number] DOB: [birth year] DOA: 08/06/2019  Referring MD/NP/PA: Dr. Eulis Foster  PCP: Leighton Ruff, MD   Outpatient Specialists: Dr. Betsy Coder, oncology  Patient coming from: Home  Chief Complaint: Dizziness  HPI: David [name] is a 74 y.o. male with medical history significant of esophageal cancer on palliative chemotherapy, hypertension, dementia, hyperlipidemia, BPH and anxiety disorder who presented to the ER with intermittent dizziness.  Patient apparently has had issues with dizziness when he gets from laying to standing position.  He is almost passed out several he.  This has been going on and off for a long time but has gotten worse recently.  Patient went for his radiation therapy today when he became diaphoretic very dizzy and agitated.  Oncology service was contacted and directed patient to the ER where he was evaluated.  No recent illness.  No issues with his meals he gets tube feedings and comfort feeds by mouth.  His lab work shows no evidence of dehydration.  Work-up at this point was mostly uneventful.  Patient however continues to be dizzy.  He is being admitted for further work-up.  He is on medication for his dementia..  ED Course: Temperature is 98.3 blood pressure 151/95 pulse 110 respiratory 20 oxygen sats 96% on room air.  Sodium 134 potassium 4.1 chloride 104 CO2 of 20 with glucose 100 creatinine 1.1.  CBC within normal urinalysis essentially negative.  Head CT without contrast is ordered currently showing no acute findings.  Patient is being admitted with presyncope and recurrent dizziness.  Review of Systems: As per HPI otherwise 10 point review of systems negative.    Past Medical History:  Diagnosis Date  . Anxiety   . BPH (benign prostatic hyperplasia)   . Cancer ([place])   . Dementia ([place])   . Depression   . Hyperlipidemia   . Hypertension   . Kidney stone   . Memory loss   . 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 Sleep apnea    CPAP  . Vitamin D deficiency     Past Surgical History:  Procedure Laterality Date  . CATARACT EXTRACTION, BILATERAL  2018  . EXTERNAL EAR SURGERY     child  . EYE SURGERY Bilateral    cataract  . IR GASTROSTOMY TUBE MOD SED  07/08/2019  . right knee meniscus  06/12/2008  . right talus repair     dislocated     reports that he has never smoked. He has never used smokeless tobacco. He reports current alcohol use of about 1.0 - 2.0 standard drinks of alcohol per week. He reports current drug use. Drug: Marijuana.  Allergies  Allergen Reactions  . Other     Other reaction(s): Other Sleep walk   . Abilify [Aripiprazole] Other (See Comments)    Short term memory loss  . Lamictal [Lamotrigine]     Increased agitation    Family History  Problem Relation Age of Onset  . Alzheimer's disease Mother   . Depression Mother   . Cancer Father        prostate  . Thyroid disease Father   . Alzheimer's disease Maternal Grandmother   . Heart disease Maternal Grandfather   . Stroke Paternal Grandmother   . Stroke Paternal Grandfather   . Alcoholism Brother   . Autism Daughter   . Autism Maternal Uncle      Prior to Admission medications   Medication Sig Start Date End Date Taking? Authorizing  Provider  buPROPion (WELLBUTRIN XL) 150 MG 24 hr tablet TAKE 1 TABLET ONCE DAILY. Patient taking differently: Take 150 mg by mouth daily.  07/18/19   [name], PMHNP  Cholecalciferol (VITAMIN D3 PO) Take 2,000 Units by mouth daily.    [provider]  donepezil (ARICEPT) 5 MG tablet TAKE ONE TABLET AT BEDTIME. Patient not taking: No sig reported 07/18/19   [name], PMHNP  escitalopram (LEXAPRO) 20 MG tablet TAKE 1 TABLET ONCE DAILY. Patient taking differently: Take 20 mg by mouth daily.  07/18/19   [name], PMHNP  esomeprazole (NEXIUM) 20 MG packet Take 20 mg by mouth daily. 07/25/19   [provider]    HYDROcodone-acetaminophen (NORCO/VICODIN) 5-325 MG tablet Take 1 tablet by mouth every 6 (six) hours as needed for moderate pain.    [provider]  LORazepam (ATIVAN) 0.5 MG tablet Take 1 tablet (0.5 mg total) by mouth every 6 (six) hours as needed for anxiety. 07/12/19   Georgette Shell, MD  memantine (NAMENDA) 10 MG tablet TAKE 1 TABLET BY MOUTH TWICE DAILY. Patient not taking: No sig reported 07/18/19   [name], PMHNP  Nutritional Supplements (FEEDING SUPPLEMENT, KATE FARMS STANDARD 1.4,) LIQD liquid 4 per day (1 bolus, 3 feeding pump) 80 ml/hr X 12 hrs  1820 calories/day or 4 cans/day 07/26/19   Ladell Pier, MD  prochlorperazine (COMPAZINE) 5 MG tablet Take 1 tablet (5 mg total) by mouth every 6 (six) hours as needed for nausea or vomiting. May take orally or crush and take via tube. 07/16/19   Tanner, Lyndon Code., PA-C  simvastatin (ZOCOR) 40 MG tablet Take 40 mg by mouth daily.    [provider]  Tamsulosin HCl (FLOMAX) 0.4 MG CAPS Take 0.4 mg by mouth at bedtime.     [provider]  vitamin B-12 (CYANOCOBALAMIN) 1000 MCG tablet Take 1,000 mcg by mouth daily.    [provider]    Physical Exam: Vitals:   08/06/19 2155 08/06/19 2157 08/06/19 2205  BP: (!) 151/95 132/83 101/70  Pulse: 79 66 (!) 106  Resp: 20    Temp: 98.3 F (36.8 C)  98.3 F (36.8 C)  SpO2: 96%        Constitutional: Stable with no acute distress Vitals:   08/06/19 2155 08/06/19 2157 08/06/19 2205  BP: (!) 151/95 132/83 101/70  Pulse: 79 66 (!) 106  Resp: 20    Temp: 98.3 F (36.8 C)  98.3 F (36.8 C)  SpO2: 96%     Eyes: PERRL, lids and conjunctivae normal ENMT: Mucous membranes are moist. Posterior pharynx clear of any exudate or lesions.Normal dentition.  Neck: normal, supple, no masses, no thyromegaly Respiratory: clear to auscultation bilaterally, no wheezing, no crackles. Normal respiratory effort. No accessory muscle use.  Cardiovascular: Sinus  tachycardia, no murmurs / rubs / gallops. No extremity edema. 2+ pedal pulses. No carotid bruits.  Abdomen: no tenderness, no masses palpated. No hepatosplenomegaly. Bowel sounds positive.  PEG tube in place Musculoskeletal: no clubbing / cyanosis. No joint deformity upper and lower extremities. Good ROM, no contractures. Normal muscle tone.  Skin: no rashes, lesions, ulcers. No induration Neurologic: CN 2-12 grossly intact. Sensation intact, DTR normal. Strength 5/5 in all 4.  Psychiatric: Normal judgment and insight. Alert and oriented x 3. Normal mood.     Labs on Admission: I have personally reviewed following labs and imaging studies  CBC: Recent Labs  Lab 08/05/19 1925 08/06/19 1948  WBC 8.1 8.9  HGB 14.2 12.9*  HCT 43.4 39.0  MCV 89.5 89.7  PLT 194 710   Basic Metabolic Panel: Recent Labs  Lab 08/05/19 1925 08/06/19 1948  NA 133* 134*  K 4.3 4.1  CL 101 104  CO2 22 20*  GLUCOSE 117* 100*  BUN 18 18  CREATININE 1.02 1.10  CALCIUM 9.7 9.8   GFR: Estimated Creatinine Clearance: 76.8 mL/min (by C-G formula based on SCr of 1.1 mg/dL). Liver Function Tests: Recent Labs  Lab 08/06/19 1948  AST 18  ALT 13  ALKPHOS 58  BILITOT 1.4*  PROT 6.7  ALBUMIN 3.8   No results for input(s): LIPASE, AMYLASE in the last 168 hours. No results for input(s): AMMONIA in the last 168 hours. Coagulation Profile: No results for input(s): INR, PROTIME in the last 168 hours. Cardiac Enzymes: No results for input(s): CKTOTAL, CKMB, CKMBINDEX, TROPONINI in the last 168 hours. BNP (last 3 results) No results for input(s): PROBNP in the last 8760 hours. HbA1C: No results for input(s): HGBA1C in the last 72 hours. CBG: Recent Labs  Lab 08/05/19 1458 08/05/19 1604  GLUCAP 136* 113*   Lipid Profile: No results for input(s): CHOL, HDL, LDLCALC, TRIG, CHOLHDL, LDLDIRECT in the last 72 hours. Thyroid Function Tests: No results for input(s): TSH, T4TOTAL, FREET4, T3FREE, THYROIDAB  in the last 72 hours. Anemia Panel: No results for input(s): VITAMINB12, FOLATE, FERRITIN, TIBC, IRON, RETICCTPCT in the last 72 hours. Urine analysis:    Component Value Date/Time   COLORURINE YELLOW 08/05/2019 1925   APPEARANCEUR HAZY (A) 08/05/2019 1925   LABSPEC 1.018 08/05/2019 1925   PHURINE 7.0 08/05/2019 1925   GLUCOSEU NEGATIVE 08/05/2019 1925   HGBUR NEGATIVE 08/05/2019 1925   BILIRUBINUR NEGATIVE 08/05/2019 1925   KETONESUR NEGATIVE 08/05/2019 1925   PROTEINUR NEGATIVE 08/05/2019 1925   NITRITE NEGATIVE 08/05/2019 1925   LEUKOCYTESUR NEGATIVE 08/05/2019 1925   Sepsis Labs: @LABRCNTIP (procalcitonin:4,lacticidven:4) )No results found for this or any previous visit (from the past 240 hour(s)).   Radiological Exams on Admission: CT HEAD WO CONTRAST  Result Date: 08/06/2019 CLINICAL DATA:  Recurrent syncope EXAM: CT HEAD WITHOUT CONTRAST TECHNIQUE: Contiguous axial images were obtained from the base of the skull through the vertex without intravenous contrast. COMPARISON:  05/06/2019 FINDINGS: Brain: There is no mass, hemorrhage or extra-axial collection. The appearance of the white matter is normal for the patient's age. There is generalized atrophy. Vascular: No abnormal hyperdensity of the major intracranial arteries or dural venous sinuses. No intracranial atherosclerosis. Skull: The visualized skull base, calvarium and extracranial soft tissues are normal. Sinuses/Orbits: No fluid levels or advanced mucosal thickening of the visualized paranasal sinuses. No mastoid or middle ear effusion. The orbits are normal. IMPRESSION: Generalized atrophy without acute intracranial abnormality. Electronically Signed   By: Ulyses Jarred M.D.   On: 08/06/2019 21:42   DG ABDOMEN PEG TUBE LOCATION  Result Date: 08/05/2019 CLINICAL DATA:  Peg tube evaluation. EXAM: ABDOMEN - 1 VIEW COMPARISON:  IR gastrostomy placement 07/08/2019 FINDINGS: A single frontal view of the abdomen is provided. A PEG  tube balloon is seen in the left upper quadrant projecting over the stomach. There is contrast within the stomach and proximal duodenum. No evidence of extraluminal contrast. The bowel gas pattern is nonobstructive. IMPRESSION: PEG tube balloon projects over the stomach. Contrast is seen within the stomach and proximal duodenum. No evidence of extraluminal contrast. Electronically Signed   By: Audie Pinto M.D.   On: 08/05/2019 18:06    EKG: Independently reviewed.  Has had sinus rhythm from EKG yesterday.  Repeat EKG in a.m.  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  I suspect medications related.  No evidence of metastatic disease to the brain.  Orthostasis will be checked.  Patient on Aricept which could cause orthostatic hypotension.  We will review other medications as well on hold.  PT and OT consultation.  Continue other treatment.  #2  Dementia: I will hold the Aricept.  If work-up is negative may get neurology consultation.  #3 esophageal cancer: Follow recommendations by oncology.  On palliative radiotherapy.  #4 essential hypertension: Continue home regimen.  #5 depression: Resume home regimen.  #6 FEN: Patient has tube feedings with comfort oral feeding.  We will resume that.   DVT prophylaxis: Lovenox Code Status: Full code Family Communication: Wife who was at bedside Disposition Plan: Home Consults called: None Admission status: Observation  Severity of Illness: The appropriate patient status for this patient is OBSERVATION. Observation status is judged to be reasonable and necessary in order to provide the required intensity of service to  ensure the patient's safety. The patient's presenting symptoms, physical exam findings, and initial radiographic and laboratory data in the context of their medical condition is felt to place them at decreased risk for further clinical deterioration. Furthermore, it is anticipated that the patient will be medically stable for discharge from the hospital within 2 midnights of admission. The following factors support the patient status of observation.   " The patient's presenting symptoms include dizziness. " The physical exam findings include no significant findings on exam. " The initial radiographic and laboratory data are normal.     Tashana Haberl,LAWAL MD [facility] Pager [phone]  If 7PM-7AM, please contact night-coverage [email or web address] Password [facility]  08/07/2019, 12:14 AM

## 2019-08-06 NOTE — Progress Notes (Signed)
Patient arrives to room 1508 from the [facility] at this time.  Patient assisted to recliner chair from wheelchair with 1 assist.  Chair alarm is on and patient is oriented to callbell with stated understanding.

## 2019-08-06 NOTE — Progress Notes (Signed)
This writer was called to the waiting room where the patient was waiting for his radiation treatment.  Upon entry the patient was noted to be extremely diaphoretic, dizzy, and pale.  Vital signs taken at that time 136/96, 91, 18, 99%, 98.5, blood sugar 136.  Patient's wife states that when this episode began the patient was unresponsive, she states it took him a few seconds to answer her.  She states he has episodes like this multiple times a day everyday.  Wife states that before each episode begins the patient becomes a little agitate or like he needs to do something.  He receives most of his nutrition via PEG tube.  His last feeding finished at 0830 and he had not received any other nutrition since then, time is now 1500.  PA in our symptom management clinic was consulted and advised this writer to take the patient to the ER.  Patient and wife transported to the ER.  Patient alert and responsive.  Will continue to follow as necessary.  Gloriajean Dell. Quincy Simmonds RN, BSN 08/05/2019 1500

## 2019-08-07 ENCOUNTER — Ambulatory Visit: Payer: Medicare Other

## 2019-08-07 ENCOUNTER — Encounter (HOSPITAL_COMMUNITY): Payer: Self-pay | Admitting: Internal Medicine

## 2019-08-07 ENCOUNTER — Ambulatory Visit
Admission: RE | Admit: 2019-08-07 | Discharge: 2019-08-07 | Disposition: A | Discharge status: Home / Self Care | Payer: Medicare Other | Source: Ambulatory Visit | Attending: Radiation Oncology | Admitting: Radiation Oncology

## 2019-08-07 ENCOUNTER — Observation Stay (HOSPITAL_COMMUNITY): Payer: Medicare Other

## 2019-08-07 DIAGNOSIS — E785 Hyperlipidemia, unspecified: Secondary | ICD-10-CM | POA: Diagnosis not present

## 2019-08-07 DIAGNOSIS — F0390 Unspecified dementia without behavioral disturbance: Secondary | ICD-10-CM

## 2019-08-07 DIAGNOSIS — C155 Malignant neoplasm of lower third of esophagus: Secondary | ICD-10-CM

## 2019-08-07 DIAGNOSIS — R55 Syncope and collapse: Secondary | ICD-10-CM

## 2019-08-07 DIAGNOSIS — I10 Essential (primary) hypertension: Secondary | ICD-10-CM

## 2019-08-07 DIAGNOSIS — I951 Orthostatic hypotension: Principal | ICD-10-CM

## 2019-08-07 DIAGNOSIS — F332 Major depressive disorder, recurrent severe without psychotic features: Secondary | ICD-10-CM

## 2019-08-07 DIAGNOSIS — F411 Generalized anxiety disorder: Secondary | ICD-10-CM

## 2019-08-07 LAB — COMPREHENSIVE METABOLIC PANEL
ALT: 11 U/L (ref 0–44)
AST: 13 U/L — ABNORMAL LOW (ref 15–41)
Albumin: 3.4 g/dL — ABNORMAL LOW (ref 3.5–5.0)
Alkaline Phosphatase: 52 U/L (ref 38–126)
Anion gap: 7 (ref 5–15)
BUN: 18 mg/dL (ref 8–23)
CO2: 25 mmol/L (ref 22–32)
Calcium: 9.3 mg/dL (ref 8.9–10.3)
Chloride: 103 mmol/L (ref 98–111)
Creatinine, Ser: 1.3 mg/dL — ABNORMAL HIGH (ref 0.61–1.24)
GFR calc Af Amer: >60 mL/min (ref >60)
GFR calc non Af Amer: 55 mL/min — ABNORMAL LOW (ref >60)
Glucose, Bld: 118 mg/dL — ABNORMAL HIGH (ref 70–99)
Potassium: 4.4 mmol/L (ref 3.5–5.1)
Sodium: 135 mmol/L (ref 135–145)
Total Bilirubin: 1.1 mg/dL (ref 0.3–1.2)
Total Protein: 6.1 g/dL — ABNORMAL LOW (ref 6.5–8.1)

## 2019-08-07 LAB — CBC
HCT: 38.5 % — ABNORMAL LOW (ref 39.0–52.0)
Hemoglobin: 12.2 g/dL — ABNORMAL LOW (ref 13.0–17.0)
MCH: 29.3 pg (ref 26.0–34.0)
MCHC: 31.7 g/dL (ref 30.0–36.0)
MCV: 92.5 fL (ref 80.0–100.0)
Platelets: 146 10*3/uL — ABNORMAL LOW (ref 150–400)
RBC: 4.16 MIL/uL — ABNORMAL LOW (ref 4.22–5.81)
RDW: 13.4 % (ref 11.5–15.5)
WBC: 7.1 10*3/uL (ref 4.0–10.5)
nRBC: 0 % (ref 0.0–0.2)

## 2019-08-07 LAB — GLUCOSE, CAPILLARY
Glucose-Capillary: 106 mg/dL — ABNORMAL HIGH (ref 70–99)
Glucose-Capillary: 108 mg/dL — ABNORMAL HIGH (ref 70–99)
Glucose-Capillary: 108 mg/dL — ABNORMAL HIGH (ref 70–99)
Glucose-Capillary: 130 mg/dL — ABNORMAL HIGH (ref 70–99)

## 2019-08-07 LAB — ECHOCARDIOGRAM COMPLETE: Weight: 3516.78 oz

## 2019-08-07 MED ORDER — CHLORHEXIDINE GLUCONATE 0.12 % MT SOLN
15.0000 mL | Freq: Two times a day (BID) | OROMUCOSAL | Status: DC
  Administered 2019-08-07 – 2019-08-13 (×13): 15 mL via OROMUCOSAL
  Filled 2019-08-07 (×12): qty 15

## 2019-08-07 MED ORDER — VITAMIN B-12 1000 MCG PO TABS
1000.0000 ug | ORAL_TABLET | Freq: Every day | ORAL | Status: DC
  Administered 2019-08-07 – 2019-08-13 (×7): 1000 ug via ORAL
  Filled 2019-08-07 (×7): qty 1

## 2019-08-07 MED ORDER — OSMOLITE 1.2 CAL PO LIQD: 1000.0000 mL | ORAL | Status: DC

## 2019-08-07 MED ORDER — PANTOPRAZOLE SODIUM 40 MG PO PACK
40.0000 mg | PACK | Freq: Every day | ORAL | Status: DC
  Administered 2019-08-07 – 2019-08-13 (×7): 40 mg via ORAL
  Filled 2019-08-07 (×7): qty 20

## 2019-08-07 MED ORDER — ESCITALOPRAM OXALATE 20 MG PO TABS
20.0000 mg | ORAL_TABLET | Freq: Every day | ORAL | Status: DC
  Administered 2019-08-07 – 2019-08-13 (×7): 20 mg via ORAL
  Filled 2019-08-07 (×7): qty 1

## 2019-08-07 MED ORDER — KATE FARMS STANDARD 1.4 PO LIQD
1300.0000 mL | ORAL | Status: DC
  Administered 2019-08-07 – 2019-08-12 (×6): 1300 mL
  Filled 2019-08-07 (×8): qty 1300

## 2019-08-07 MED ORDER — ORAL CARE MOUTH RINSE
15.0000 mL | Freq: Two times a day (BID) | OROMUCOSAL | Status: DC
  Administered 2019-08-07 – 2019-08-13 (×9): 15 mL via OROMUCOSAL

## 2019-08-07 MED ORDER — BUPROPION HCL ER (XL) 150 MG PO TB24
150.0000 mg | ORAL_TABLET | Freq: Every day | ORAL | Status: DC
  Administered 2019-08-07 – 2019-08-13 (×7): 150 mg via ORAL
  Filled 2019-08-07 (×7): qty 1

## 2019-08-07 MED ORDER — HYDROCODONE-ACETAMINOPHEN 5-325 MG PO TABS: 1.0000 | ORAL_TABLET | Freq: Four times a day (QID) | ORAL | Status: DC | PRN

## 2019-08-07 MED ORDER — SIMVASTATIN 40 MG PO TABS
40.0000 mg | ORAL_TABLET | Freq: Every day | ORAL | Status: DC
  Administered 2019-08-07 – 2019-08-13 (×7): 40 mg via ORAL
  Filled 2019-08-07 (×7): qty 1

## 2019-08-07 MED ORDER — LORAZEPAM 0.5 MG PO TABS
0.5000 mg | ORAL_TABLET | Freq: Four times a day (QID) | ORAL | Status: DC | PRN
  Administered 2019-08-07 – 2019-08-13 (×5): 0.5 mg via ORAL
  Filled 2019-08-07 (×5): qty 1

## 2019-08-07 NOTE — Plan of Care (Signed)

## 2019-08-07 NOTE — Progress Notes (Signed)
PROGRESS NOTE    David [name]  PTW:[id number] DOB: [birth year] DOA: 08/06/2019 PCP: Leighton Ruff, MD   Chief complaint: Dizziness  Brief Narrative:  HPI per Dr. Eddie North David [name] is a 74 y.o. male with medical history significant of esophageal cancer on palliative chemotherapy, hypertension, dementia, hyperlipidemia, BPH and anxiety disorder who presented to the ER with intermittent dizziness.  Patient apparently has had issues with dizziness when he gets from laying to standing position.  He is almost passed out several he.  This has been going on and off for a long time but has gotten worse recently.  Patient went for his radiation therapy today when he became diaphoretic very dizzy and agitated.  Oncology service was contacted and directed patient to the ER where he was evaluated.  No recent illness.  No issues with his meals he gets tube feedings and comfort feeds by mouth.  His lab work shows no evidence of dehydration.  Work-up at this point was mostly uneventful.  Patient however continues to be dizzy.  He is being admitted for further work-up.  He is on medication for his dementia..  ED Course: Temperature is 98.3 blood pressure 151/95 pulse 110 respiratory 20 oxygen sats 96% on room air.  Sodium 134 potassium 4.1 chloride 104 CO2 of 20 with glucose 100 creatinine 1.1.  CBC within normal urinalysis essentially negative.  Head CT without contrast is ordered currently showing no acute findings.  Patient is being admitted with presyncope and recurrent dizziness.  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  Felt possibly could be secondary to medication related.  Head CT done negative for any metastatic disease or acute abnormalities.  Patient noted to be orthostatic.  Aricept on hold as felt could lead to orthostatic hypotension.  Continue IV fluids.  TED hose.  Repeat orthostasis in the morning.  PT/OT.  2.  Dementia Continue to hold Aricept secondary to orthostasis.  Outpatient follow-up.  3.  History of esophageal cancer Patient noted to be on palliative radiation therapy.  Oncology informed of admission via epic.  Follow.  4.  Hypertension Patient noted to be orthostatic.  Patient currently on IV fluids.  Hold on antihypertensive medications at this time.  5.  Hyperlipidemia Resume Zocor.  6.  Depression/GAD Resume home regimen of Wellbutrin and Lexapro.  7.  Moderate protein calorie malnutrition Likely secondary to esophageal cancer and poor oral intake.  Patient currently on home regimen of tube feeds.  On a soft diet.  Follow.   DVT prophylaxis: Lovenox Code Status: Full Family Communication: Updated patient.  No family at bedside. Disposition:   Status is: Observation    Dispo: The patient is from: Home              Anticipated d/c is to: Home              Anticipated d/c date is: Hopefully 1 to 2 days once clinically improved.              Patient currently receiving IV fluids noted to be orthostatic on presentation still with  complaints of disorientation.       Consultants:   Oncology notified via epic of admission.  Procedures:   CT head 08/06/2019    Antimicrobials:   None   Subjective: Patient states he feels somewhat disoriented and confused this morning.  Denies any chest pain.  Has not stood up to see whether still dizzy.  No shortness of breath.  No abdominal pain.  Objective: Vitals:   08/07/19 0209 08/07/19 0500 08/07/19 0641 08/07/19 0644  BP: 126/64  (!) 142/76 99/69  Pulse: 76  73 98  Resp:  17  18 18   Temp: 98.6 F (37 C)  97.6 F (36.4 C) 97.6 F (36.4 C)  TempSrc: Oral  Oral   SpO2: 98%  100% 97%  Weight:  99.7 kg      Intake/Output Summary (Last 24 hours) at 08/07/2019 1154 Last data filed at 08/07/2019 0948 Gross per 24 hour  Intake 764.26 ml  Output 175 ml  Net 589.26 ml   Filed Weights   08/07/19 0500  Weight: 99.7 kg    Examination:  General exam: Appears calm and comfortable  Respiratory system: Clear to auscultation. Respiratory effort normal. Cardiovascular system: S1 & S2 heard, RRR. No JVD, murmurs, rubs, gallops or clicks. No pedal edema. Gastrointestinal system: Abdomen is nondistended, soft and nontender. No organomegaly or masses felt. Normal bowel sounds heard.  PEG tube intact. Central nervous system: Alert and oriented. No focal neurological deficits. Extremities: Symmetric 5 x 5 power. Skin: No rashes, lesions or ulcers Psychiatry: Judgement and insight appear normal. Mood & affect appropriate.     Data Reviewed: I have personally reviewed following labs and imaging studies  CBC: Recent Labs  Lab 08/05/19 1925 08/06/19 1948 08/07/19 0537  WBC 8.1 8.9 7.1  HGB 14.2 12.9* 12.2*  HCT 43.4 39.0 38.5*  MCV 89.5 89.7 92.5  PLT 194 190 146*    Basic Metabolic Panel: Recent Labs  Lab 08/05/19 1925 08/06/19 1948 08/07/19 0537  NA 133* 134* 135  K 4.3 4.1 4.4  CL 101 104 103  CO2 22 20* 25  GLUCOSE 117* 100* 118*  BUN 18 18 18   CREATININE 1.02 1.10 1.30*  CALCIUM 9.7 9.8 9.3    GFR: Estimated Creatinine Clearance: 64.8 mL/min (A) (by C-G formula based on SCr of 1.3 mg/dL (David)).  Liver Function Tests: Recent Labs  Lab 08/06/19 1948 08/07/19 0537  AST 18 13*  ALT 13 11  ALKPHOS 58 52  BILITOT 1.4* 1.1  PROT 6.7 6.1*  ALBUMIN 3.8 3.4*    CBG: Recent Labs  Lab 08/05/19 1458 08/05/19 1604  GLUCAP 136* 113*     No results found for this or any previous visit (from the past 240 hour(s)).       Radiology  Studies: CT HEAD WO CONTRAST  Result Date: 08/06/2019 CLINICAL DATA:  Recurrent syncope EXAM: CT HEAD WITHOUT CONTRAST TECHNIQUE: Contiguous axial images were obtained from the base of the skull through the vertex without intravenous contrast. COMPARISON:  05/06/2019 FINDINGS: Brain: There is no mass, hemorrhage or extra-axial collection. The appearance of the white matter is normal for the patient's age. There is generalized atrophy. Vascular: No abnormal hyperdensity of the major intracranial arteries or dural venous sinuses. No intracranial atherosclerosis. Skull: The visualized skull base, calvarium and extracranial soft tissues are normal. Sinuses/Orbits: No fluid levels or advanced mucosal thickening of the visualized paranasal sinuses. No mastoid or middle ear effusion. The orbits are normal. IMPRESSION: Generalized atrophy without acute intracranial  abnormality. Electronically Signed   By: Ulyses Jarred M.D.   On: 08/06/2019 21:42   DG ABDOMEN PEG TUBE LOCATION  Result Date: 08/05/2019 CLINICAL DATA:  Peg tube evaluation. EXAM: ABDOMEN - 1 VIEW COMPARISON:  IR gastrostomy placement 07/08/2019 FINDINGS: A single frontal view of the abdomen is provided. A PEG tube balloon is seen in the left upper quadrant projecting over the stomach. There is contrast within the stomach and proximal duodenum. No evidence of extraluminal contrast. The bowel gas pattern is nonobstructive. IMPRESSION: PEG tube balloon projects over the stomach. Contrast is seen within the stomach and proximal duodenum. No evidence of extraluminal contrast. Electronically Signed   By: Audie Pinto M.D.   On: 08/05/2019 18:06        Scheduled Meds: . buPROPion  150 mg Oral Daily  . chlorhexidine  15 mL Mouth Rinse BID  . enoxaparin (LOVENOX) injection  40 mg Subcutaneous Q24H  . escitalopram  20 mg Oral Daily  . feeding supplement (KATE FARMS STANDARD 1.4)  1,300 mL Per Tube Q24H  . mouth rinse  15 mL Mouth Rinse q12n4p    . pantoprazole sodium  40 mg Oral Daily  . simvastatin  40 mg Oral Daily  . sodium chloride flush  3 mL Intravenous Q12H  . vitamin B-12  1,000 mcg Oral Daily   Continuous Infusions: . sodium chloride 125 mL/hr at 08/07/19 0503     LOS: 0 days    Time spent: 35 minutes    Irine Seal, MD [facility]   To contact the attending provider between 7A-7P or the covering provider during after hours 7P-7A, please log into the web site [email or web address] and access using universal [place] password for that web site. If you do not have the password, please call the hospital operator.  08/07/2019, 11:54 AM

## 2019-08-07 NOTE — Progress Notes (Signed)
IP PROGRESS NOTE  Subjective:   [name] was admitted yesterday after witnessed presyncope.  David [name] reports feeling tired.  No other complaint today.  No nausea/vomiting or hematemesis.  Objective: Vital signs in last 24 hours: Blood pressure (!) 146/82, pulse 64, temperature 98 F (36.7 C), temperature source Oral, resp. rate 18, weight 219 lb 12.8 oz (99.7 kg), SpO2 100 %.  Intake/Output from previous day: 04/27 0701 - 04/28 0700 In: 764.3 [I.V.:764.3] Out: -   Physical Exam:  HEENT: No thrush Lungs: Clear bilaterally Cardiac: Regular rate and rhythm Abdomen: No hepatosplenomegaly, soft and nontender, upper abdomen feeding tube Extremities: No leg edema   Lab Results: Recent Labs    08/06/19 1948 08/07/19 0537  WBC 8.9 7.1  HGB 12.9* 12.2*  HCT 39.0 38.5*  PLT 190 146*    BMET Recent Labs    08/06/19 1948 08/07/19 0537  NA 134* 135  K 4.1 4.4  CL 104 103  CO2 20* 25  GLUCOSE 100* 118*  BUN 18 18  CREATININE 1.10 1.30*  CALCIUM 9.8 9.3    No results found for: CEA1  Studies/Results: CT HEAD WO CONTRAST  Result Date: 08/06/2019 CLINICAL DATA:  Recurrent syncope EXAM: CT HEAD WITHOUT CONTRAST TECHNIQUE: Contiguous axial images were obtained from the base of the skull through the vertex without intravenous contrast. COMPARISON:  05/06/2019 FINDINGS: Brain: There is no mass, hemorrhage or extra-axial collection. The appearance of the white matter is normal for the patient's age. There is generalized atrophy. Vascular: No abnormal hyperdensity of the major intracranial arteries or dural venous sinuses. No intracranial atherosclerosis. Skull: The visualized skull base, calvarium and extracranial soft tissues are normal. Sinuses/Orbits: No fluid levels or advanced mucosal thickening of the visualized paranasal sinuses. No mastoid or middle ear effusion. The orbits are normal. IMPRESSION: Generalized atrophy without acute intracranial abnormality. Electronically Signed    By: Ulyses Jarred M.D.   On: 08/06/2019 21:42   DG ABDOMEN PEG TUBE LOCATION  Result Date: 08/05/2019 CLINICAL DATA:  Peg tube evaluation. EXAM: ABDOMEN - 1 VIEW COMPARISON:  IR gastrostomy placement 07/08/2019 FINDINGS: A single frontal view of the abdomen is provided. A PEG tube balloon is seen in the left upper quadrant projecting over the stomach. There is contrast within the stomach and proximal duodenum. No evidence of extraluminal contrast. The bowel gas pattern is nonobstructive. IMPRESSION: PEG tube balloon projects over the stomach. Contrast is seen within the stomach and proximal duodenum. No evidence of extraluminal contrast. Electronically Signed   By: Audie Pinto M.D.   On: 08/05/2019 18:06    Medications: I have reviewed the patient's current medications.  Assessment/Plan: 1. Esophagus cancer ? CT 05/06/2019-circumferential thickening of the distal esophagus, mild splenomegaly, bladder wall thickening, enlarged prostate ? Upper endoscopy 06/11/2019-ulcerated mass at the lower esophagus, 36 cm from the incisors, partially obstructing, biopsy revealed atypical cells-reactive change versus poorly differentiated neoplasm, outside consultation at [facility] on this biopsy and a repeat biopsy 06/21/2019 confirmed a poorly differentiated malignant neoplasm ? HER-2 negative, PD-L1 combined positive score -20 ? Repeat endoscopy 06/21/2019-partially obstructing esophageal tumor found in the lower third of the esophagus ? PET scan 07/05/2019-large esophageal mass with nodal involvement both above and below the diaphragm including a left periaortic node ? Palliative radiation to the esophagus mass starting 07/22/2019 2. Dementia 3. Anxiety/depression 4. Sleep apnea 5. BPH 6. Dysphagia secondary to #1  Gastrostomy tube placement 07/08/2019 7. Hyperlipidemia 8. Admission with hematemesis 07/21/2019 9. Admission for dehydration and pre-syncope  in clinic 08/06/19   David [name] has esophagus  cancer.  David [name] is currently completing palliative radiation for severe dysphagia.  The dysphagia has partially improved.  David [name] was admitted yesterday with witnessed presyncope.  David [name] reports multiple similar episodes over the previous few days.  I suspect David [name] was dehydrated.  Recommendations: 1.  Continue intravenous hydration 2.  Continue palliative radiation to the esophagus 3.  Consider skilled nursing facility placement if David [name] is unable to maintain hydration at home   LOS: 0 days   Betsy Coder, MD   08/07/2019, 3:12 PM

## 2019-08-07 NOTE — Progress Notes (Signed)
Echocardiogram  2D Echocardiogram has been performed.  Oneal Deputy Vee Bahe 08/07/2019, 8:31 AM

## 2019-08-07 NOTE — Progress Notes (Signed)
Initial Nutrition Assessment  DOCUMENTATION CODES:   Not applicable  INTERVENTION:  325 ml bolus of [employer] 1.4 once/day in addition to continuing with current orders [employer] 1.4 at 80 ml/hr x12 hours/day (1830-0630) with 100 ml free water every 4 hours.  - this regimen will provide a total of 1809 kcal, 79 grams protein, and 1536 ml free water.   NUTRITION DIAGNOSIS:   Increased nutrient needs related to cancer and cancer related treatments as evidenced by estimated needs.  GOAL:   Patient will meet greater than or equal to 90% of their needs  MONITOR:   TF tolerance, Weight trends, Labs  REASON FOR ASSESSMENT:   Malnutrition Screening Tool, Consult Enteral/tube feeding initiation and management  ASSESSMENT:  RD working remotely.  74 year old male with past medical history of dementia, HTN, HLD, OSA, and recent diagnosis of esophageal cancer s/p PEG placement, receiving palliative chemotherapy, presented with intermittent dizziness and admitted for observation of syncope and collapse.  RD consulted for enteral/tube feeding assessment and recommendations.  Patient is followed by outpatient RD at cancer center, last contact on 07/30/19. Home regimen: [employer] 1.4, 1300 ml/day or 4 cartons daily (1 bolus, 3 feeding pump)  80 ml/hr X 12 hrs   Non-pitting generalized edema noted per RN assessment. Weight history reviewed and have remained stable (219-222 lbs) over the past month.  Medications reviewed and include: Protonix, B12 IVF: NaCl @ 125 ml/hr (3000 ml/day) Labs reviewed  NUTRITION - FOCUSED PHYSICAL EXAM: Unable to complete at this time, RD working remotely.   Diet Order:   Diet Order            DIET SOFT Room service appropriate? Yes; Fluid consistency: Thin  Diet effective now              EDUCATION NEEDS:   No education needs have been identified at this time  Skin:  Skin Assessment: Reviewed RN Assessment  Last BM:  pta  Height:    Ht Readings from Last 1 Encounters:  08/05/19 6\' 2"  (1.88 m)    Weight:   Wt Readings from Last 1 Encounters:  08/07/19 99.7 kg    BMI:  Body mass index is 28.22 kg/m.  Estimated Nutritional Needs:   Kcal:  5176-1607  Protein:  115-130  Fluid:  >/= 2.5 L/day   Lajuan Lines, RD, LDN Clinical Nutrition After Hours/Weekend Pager # in [place]

## 2019-08-08 ENCOUNTER — Inpatient Hospital Stay: Payer: Medicare Other

## 2019-08-08 ENCOUNTER — Telehealth: Payer: Self-pay | Admitting: General Practice

## 2019-08-08 ENCOUNTER — Ambulatory Visit
Admission: RE | Admit: 2019-08-08 | Discharge: 2019-08-08 | Disposition: A | Discharge status: Home / Self Care | Payer: Medicare Other | Source: Ambulatory Visit | Attending: Radiation Oncology | Admitting: Radiation Oncology

## 2019-08-08 ENCOUNTER — Ambulatory Visit: Payer: Medicare Other

## 2019-08-08 ENCOUNTER — Other Ambulatory Visit: Payer: Self-pay

## 2019-08-08 DIAGNOSIS — E785 Hyperlipidemia, unspecified: Secondary | ICD-10-CM | POA: Diagnosis present

## 2019-08-08 DIAGNOSIS — F0390 Unspecified dementia without behavioral disturbance: Secondary | ICD-10-CM | POA: Diagnosis present

## 2019-08-08 DIAGNOSIS — E440 Moderate protein-calorie malnutrition: Secondary | ICD-10-CM | POA: Diagnosis present

## 2019-08-08 DIAGNOSIS — Z811 Family history of alcohol abuse and dependence: Secondary | ICD-10-CM | POA: Diagnosis not present

## 2019-08-08 DIAGNOSIS — Z823 Family history of stroke: Secondary | ICD-10-CM | POA: Diagnosis not present

## 2019-08-08 DIAGNOSIS — F332 Major depressive disorder, recurrent severe without psychotic features: Secondary | ICD-10-CM | POA: Diagnosis present

## 2019-08-08 DIAGNOSIS — Z87442 Personal history of urinary calculi: Secondary | ICD-10-CM | POA: Diagnosis not present

## 2019-08-08 DIAGNOSIS — Z515 Encounter for palliative care: Secondary | ICD-10-CM | POA: Diagnosis not present

## 2019-08-08 DIAGNOSIS — R55 Syncope and collapse: Secondary | ICD-10-CM | POA: Diagnosis present

## 2019-08-08 DIAGNOSIS — Z7189 Other specified counseling: Secondary | ICD-10-CM | POA: Diagnosis not present

## 2019-08-08 DIAGNOSIS — Z8249 Family history of ischemic heart disease and other diseases of the circulatory system: Secondary | ICD-10-CM | POA: Diagnosis not present

## 2019-08-08 DIAGNOSIS — I10 Essential (primary) hypertension: Secondary | ICD-10-CM | POA: Diagnosis present

## 2019-08-08 DIAGNOSIS — R161 Splenomegaly, not elsewhere classified: Secondary | ICD-10-CM | POA: Diagnosis present

## 2019-08-08 DIAGNOSIS — F411 Generalized anxiety disorder: Secondary | ICD-10-CM | POA: Diagnosis present

## 2019-08-08 DIAGNOSIS — C155 Malignant neoplasm of lower third of esophagus: Secondary | ICD-10-CM | POA: Diagnosis present

## 2019-08-08 DIAGNOSIS — R1310 Dysphagia, unspecified: Secondary | ICD-10-CM | POA: Diagnosis present

## 2019-08-08 DIAGNOSIS — G4730 Sleep apnea, unspecified: Secondary | ICD-10-CM | POA: Diagnosis present

## 2019-08-08 DIAGNOSIS — N400 Enlarged prostate without lower urinary tract symptoms: Secondary | ICD-10-CM | POA: Diagnosis present

## 2019-08-08 DIAGNOSIS — Z818 Family history of other mental and behavioral disorders: Secondary | ICD-10-CM | POA: Diagnosis not present

## 2019-08-08 DIAGNOSIS — Z20822 Contact with and (suspected) exposure to covid-19: Secondary | ICD-10-CM | POA: Diagnosis present

## 2019-08-08 DIAGNOSIS — Z6828 Body mass index (BMI) 28.0-28.9, adult: Secondary | ICD-10-CM | POA: Diagnosis not present

## 2019-08-08 DIAGNOSIS — Z820 Family history of epilepsy and other diseases of the nervous system: Secondary | ICD-10-CM | POA: Diagnosis not present

## 2019-08-08 DIAGNOSIS — I951 Orthostatic hypotension: Secondary | ICD-10-CM | POA: Diagnosis present

## 2019-08-08 DIAGNOSIS — Z79899 Other long term (current) drug therapy: Secondary | ICD-10-CM | POA: Diagnosis not present

## 2019-08-08 DIAGNOSIS — Z9841 Cataract extraction status, right eye: Secondary | ICD-10-CM | POA: Diagnosis not present

## 2019-08-08 DIAGNOSIS — Z8349 Family history of other endocrine, nutritional and metabolic diseases: Secondary | ICD-10-CM | POA: Diagnosis not present

## 2019-08-08 DIAGNOSIS — Z9842 Cataract extraction status, left eye: Secondary | ICD-10-CM | POA: Diagnosis not present

## 2019-08-08 DIAGNOSIS — R531 Weakness: Secondary | ICD-10-CM | POA: Diagnosis not present

## 2019-08-08 LAB — BASIC METABOLIC PANEL
Anion gap: 7 (ref 5–15)
BUN: 16 mg/dL (ref 8–23)
CO2: 23 mmol/L (ref 22–32)
Calcium: 8.9 mg/dL (ref 8.9–10.3)
Chloride: 105 mmol/L (ref 98–111)
Creatinine, Ser: 1.04 mg/dL (ref 0.61–1.24)
GFR calc Af Amer: >60 mL/min (ref >60)
GFR calc non Af Amer: >60 mL/min (ref >60)
Glucose, Bld: 118 mg/dL — ABNORMAL HIGH (ref 70–99)
Potassium: 4.1 mmol/L (ref 3.5–5.1)
Sodium: 135 mmol/L (ref 135–145)

## 2019-08-08 LAB — GLUCOSE, CAPILLARY
Glucose-Capillary: 102 mg/dL — ABNORMAL HIGH (ref 70–99)
Glucose-Capillary: 104 mg/dL — ABNORMAL HIGH (ref 70–99)
Glucose-Capillary: 120 mg/dL — ABNORMAL HIGH (ref 70–99)
Glucose-Capillary: 87 mg/dL (ref 70–99)
Glucose-Capillary: 96 mg/dL (ref 70–99)
Glucose-Capillary: 99 mg/dL (ref 70–99)

## 2019-08-08 LAB — CBC
HCT: 36.2 % — ABNORMAL LOW (ref 39.0–52.0)
Hemoglobin: 11.7 g/dL — ABNORMAL LOW (ref 13.0–17.0)
MCH: 29.7 pg (ref 26.0–34.0)
MCHC: 32.3 g/dL (ref 30.0–36.0)
MCV: 91.9 fL (ref 80.0–100.0)
Platelets: 129 10*3/uL — ABNORMAL LOW (ref 150–400)
RBC: 3.94 MIL/uL — ABNORMAL LOW (ref 4.22–5.81)
RDW: 13.2 % (ref 11.5–15.5)
WBC: 6.1 10*3/uL (ref 4.0–10.5)
nRBC: 0 % (ref 0.0–0.2)

## 2019-08-08 MED ORDER — ENSURE ENLIVE PO LIQD
237.0000 mL | Freq: Two times a day (BID) | ORAL | Status: DC
  Administered 2019-08-08 – 2019-08-12 (×7): 237 mL via ORAL

## 2019-08-08 NOTE — Progress Notes (Signed)
PROGRESS NOTE    David [name]  CWC:[id number] DOB: 10/02/1945 DOA: 08/06/2019 PCP: Leighton Ruff, MD   Chief complaint: Dizziness  Brief Narrative:  HPI per Dr. Eddie North David [name] is a 74 y.o. male with medical history significant of esophageal cancer on palliative chemotherapy, hypertension, dementia, hyperlipidemia, BPH and anxiety disorder who presented to the ER with intermittent dizziness.  Patient apparently has had issues with dizziness when he gets from laying to standing position.  He is almost passed out several he.  This has been going on and off for a long time but has gotten worse recently.  Patient went for his radiation therapy today when he became diaphoretic very dizzy and agitated.  Oncology service was contacted and directed patient to the ER where he was evaluated.  No recent illness.  No issues with his meals he gets tube feedings and comfort feeds by mouth.  His lab work shows no evidence of dehydration.  Work-up at this point was mostly uneventful.  Patient however continues to be dizzy.  He is being admitted for further work-up.  He is on medication for his dementia..  ED Course: Temperature is 98.3 blood pressure 151/95 pulse 110 respiratory 20 oxygen sats 96% on room air.  Sodium 134 potassium 4.1 chloride 104 CO2 of 20 with glucose 100 creatinine 1.1.  CBC within normal urinalysis essentially negative.  Head CT without contrast is ordered currently showing no acute findings.  Patient is being admitted with presyncope and recurrent dizziness.  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  Felt possibly could be secondary to medication related.  Head CT done negative for any metastatic disease or acute abnormalities.  Patient noted to be orthostatic on admission and still orthostatic today and symptomatic.  Aricept on hold as felt could lead to orthostatic hypotension.  Continue IV fluids.  TED hose.  Repeat orthostasis in the morning.  PT/OT.  2.  Dementia Continue to hold Aricept secondary to orthostasis.  Outpatient follow-up.  3.  History of esophageal cancer Patient noted to be on palliative radiation therapy.  Oncology informed of admission via epic.  Follow.  4.  Hypertension Patient noted to be orthostatic.  Patient currently on IV fluids.  Continue to hold antihypertensive medications as patient still orthostatic.  Follow.   5.  Hyperlipidemia Zocor.  6.  Depression/GAD Continue Lexapro and Wellbutrin.  7.  Moderate protein calorie malnutrition Likely secondary to esophageal cancer and poor oral intake.  Patient currently on home regimen of tube feeds.  On a soft diet.  Follow.   DVT prophylaxis: Lovenox Code Status: Full Family Communication: Updated patient.  No family at bedside. Disposition:   Status is: Observation    Dispo: The patient is from: Home              Anticipated d/c is to: Home              Anticipated d/c date is: Hopefully 1 to 2 days once clinically improved and asymptomatic.              Patient currently  receiving IV fluids noted to still be orthostatic and symptomatic.       Consultants:   Oncology notified via epic of admission.  Procedures:   CT head 08/06/2019    Antimicrobials:   None   Subjective: Patient still with complaints of dizziness and lightheadedness from supine to standing.  Patient denies any chest pain.  No shortness of breath.  Sleeping but arousable.   Objective: Vitals:   08/08/19 0708 08/08/19 0835 08/08/19 0841 08/08/19 0844  BP: 126/68 127/69  (!) 143/79 103/67  Pulse: 66 68 73 87  Resp: 18 17    Temp: 98.3 F (36.8 C) 98.2 F (36.8 C)    TempSrc: Oral Oral    SpO2: 96% 98% 100% 99%  Weight:        Intake/Output Summary (Last 24 hours) at 08/08/2019 1134 Last data filed at 08/08/2019 0300 Gross per 24 hour  Intake --  Output 1000 ml  Net -1000 ml   Filed Weights   08/07/19 0500 08/08/19 0500  Weight: 99.7 kg 101 kg    Examination:  General exam: NAD  Respiratory system: CTA B.  No wheezes, no crackles, no rhonchi.  Normal respiratory effort.  Cardiovascular system: Regular rate rhythm no murmurs rubs or gallops.  No JVD.  No lower extremity edema.  Gastrointestinal system: Abdomen is soft, nontender, nondistended, positive bowel sounds.  No rebound.  No guarding.  PEG tube intact.  Central nervous system: Alert and oriented. No focal neurological deficits. Extremities: Symmetric 5 x 5 power. Skin: No rashes, lesions or ulcers Psychiatry: Judgement and insight appear normal. Mood & affect appropriate.     Data Reviewed: I have personally reviewed following labs and imaging studies  CBC: Recent Labs  Lab 08/05/19 1925 08/06/19 1948 08/07/19 0537 08/08/19 0649  WBC 8.1 8.9 7.1 6.1  HGB 14.2 12.9* 12.2* 11.7*  HCT 43.4 39.0 38.5* 36.2*  MCV 89.5 89.7 92.5 91.9  PLT 194 190 146* 129*    Basic Metabolic Panel: Recent Labs  Lab 08/05/19 1925 08/06/19 1948 08/07/19 0537 08/08/19 0649  NA 133* 134* 135 135  K 4.3 4.1 4.4 4.1  CL 101 104 103 105  CO2 22 20* 25 23  GLUCOSE 117* 100* 118* 118*  BUN 18 18 18 16   CREATININE 1.02 1.10 1.30* 1.04  CALCIUM 9.7 9.8 9.3 8.9    GFR: Estimated Creatinine Clearance: 81.5 mL/min (by C-G formula based on SCr of 1.04 mg/dL).  Liver Function Tests: Recent Labs  Lab 08/06/19 1948 08/07/19 0537  AST 18 13*  ALT 13 11  ALKPHOS 58 52  BILITOT 1.4* 1.1  PROT 6.7 6.1*  ALBUMIN 3.8 3.4*    CBG: Recent Labs  Lab 08/07/19 2027 08/07/19 2347  08/08/19 0338 08/08/19 0752 08/08/19 1130  GLUCAP 130* 108* 104* 99 102*     No results found for this or any previous visit (from the past 240 hour(s)).       Radiology Studies: CT HEAD WO CONTRAST  Result Date: 08/06/2019 CLINICAL DATA:  Recurrent syncope EXAM: CT HEAD WITHOUT CONTRAST TECHNIQUE: Contiguous axial images were obtained from the base of the skull through the vertex without intravenous contrast. COMPARISON:  05/06/2019 FINDINGS: Brain: There is no mass, hemorrhage or extra-axial collection. The appearance of the white matter is normal for the patient's age. There is generalized atrophy. Vascular: No abnormal hyperdensity of the major intracranial arteries or dural venous sinuses. No intracranial atherosclerosis. Skull: The visualized skull base, calvarium and extracranial soft tissues  are normal. Sinuses/Orbits: No fluid levels or advanced mucosal thickening of the visualized paranasal sinuses. No mastoid or middle ear effusion. The orbits are normal. IMPRESSION: Generalized atrophy without acute intracranial abnormality. Electronically Signed   By: Ulyses Jarred M.D.   On: 08/06/2019 21:42        Scheduled Meds: . buPROPion  150 mg Oral Daily  . chlorhexidine  15 mL Mouth Rinse BID  . enoxaparin (LOVENOX) injection  40 mg Subcutaneous Q24H  . escitalopram  20 mg Oral Daily  . feeding supplement (ENSURE ENLIVE)  237 mL Oral BID BM  . feeding supplement (K[employer] STANDARD 1.4)  1,300 mL Per Tube Q24H  . mouth rinse  15 mL Mouth Rinse q12n4p  . pantoprazole sodium  40 mg Oral Daily  . simvastatin  40 mg Oral Daily  . sodium chloride flush  3 mL Intravenous Q12H  . vitamin B-12  1,000 mcg Oral Daily   Continuous Infusions: . sodium chloride 125 mL/hr at 08/07/19 1425     LOS: 0 days    Time spent: 35 minutes    Irine Seal, MD [facility]   To contact the attending provider between 7A-7P or the covering provider during after hours 7P-7A,  please log into the web site [email or web address] and access using universal [place] password for that web site. If you do not have the password, please call the hospital operator.  08/08/2019, 11:34 AM

## 2019-08-08 NOTE — Evaluation (Signed)
Occupational Therapy Evaluation Patient Details Name: David [name] MRN: [id number] DOB: June 12, 1945 Today's Date: 08/08/2019    History of Present Illness David [name] is a 74 y.o. male with medical history significant of esophageal cancer on palliative chemotherapy, hypertension, dementia, hyperlipidemia, BPH and anxiety disorder who presented to the ER with intermittent dizziness.  Patient apparently has had issues with dizziness when he gets from laying to standing position.  He is almost passed out several he.  This has been going on and off for a long time but has gotten worse recently.  Patient went for his radiation therapy today when he became diaphoretic very dizzy and agitated.  Oncology service was contacted and directed patient to the ER where he was evaluated.  No recent illness.  No issues with his meals he gets tube feedings and comfort feeds by mouth.  His lab work shows no evidence of dehydration.  Work-up at this point was mostly uneventful.  Patient however continues to be dizzy.  He is being admitted for further work-up.  He is on medication for his dementia..   Clinical Impression   mitral regurgitation. David [name] is a 74 year old man admitted to hospital after syncopal episode. On evaluation patient reports persistent dizziness and demonstrates limited activity tolerance. Patient will benefit from skilled OT services to improve patient's ability to perform independent ADL tasks in order to return home at discharge. Per Rn and PT - patient has a history of chronic dizziness at baseline. May require David [name] OT services if his activity tolerance doesn't improve/    Follow Up Recommendations  Home health OT    Equipment Recommendations       Recommendations for Other Services       Precautions / Restrictions Precautions Precautions: Fall Precaution Comments: G tube Restrictions Weight Bearing Restrictions: No      Mobility Bed Mobility Overal bed mobility: Needs  Assistance Bed Mobility: Supine to Sit;Sit to Supine     Supine to sit: Supervision Sit to supine: Supervision      Transfers   Equipment used: None Transfers: Sit to/from Stand Sit to Stand: Supervision              Balance Overall balance assessment: No apparent balance deficits (not formally assessed)(evaluation limited due to patient wanting to lie back down. No overt loss of balance with standing.)                                         ADL either performed or assessed with clinical judgement   ADL Overall ADL's : Needs assistance/impaired Eating/Feeding: Set up   Grooming: Set up;Supervision/safety   Upper Body Bathing: Set up;Supervision/ safety   Lower Body Bathing: Set up;Supervison/ safety   Upper Body Dressing : Set up;Supervision/safety   Lower Body Dressing: Set up;Supervision/safety Lower Body Dressing Details (indicate cue type and reason): Patient donned shoes seated at edge of bed.             Functional mobility during ADLs: Supervision/safety General ADL Comments: Patient complains of dizziness throughout assessment and limited evaluation. BP monitored and WFL. Patient requested to lay down. Patient demonstrates the functional mobility and physical skills to perform ADLs but unsafe for independent mobility due to complaints of dizziness.     Vision   Vision Assessment?: No apparent visual deficits     Perception     Praxis  Pertinent Vitals/Pain Pain Assessment: No/denies pain     Hand Dominance     Extremity/Trunk Assessment Upper Extremity Assessment Upper Extremity Assessment: Overall WFL for tasks assessed   Lower Extremity Assessment Lower Extremity Assessment: Defer to PT evaluation   Cervical / Trunk Assessment Cervical / Trunk Assessment: Normal   Communication Communication Communication: No difficulties   Cognition Arousal/Alertness: (drowsy) Behavior During Therapy: WFL for tasks  assessed/performed Overall Cognitive Status: No family/caregiver present to determine baseline cognitive functioning                                 General Comments: hx of dementia, impaired memory   General Comments       Exercises     Shoulder Instructions      Home Living Family/patient expects to be discharged to:: Private residence Living Arrangements: Alone Available Help at Discharge: Family;Available PRN/intermittently Type of Home: Other(Comment)(Condo) Home Access: Stairs to enter Entrance Stairs-Number of Steps: [address]md floor unit   Home Layout: One level     Bathroom Shower/Tub: Tub/shower unit;Walk-in shower   Bathroom Toilet: Standard Bathroom Accessibility: Yes   Home Equipment: None   Additional Comments: Reports his wife - who he is separated from - assists him with keeping things in order and managing his appointments.      Prior Functioning/Environment Level of Independence: Independent        Comments: Reports independence with adls and ambulation. Per case management note patient getting in personal care services Mon - Thurs from 9 - 1.        OT Problem List: Decreased activity tolerance;Decreased safety awareness      OT Treatment/Interventions: Self-care/ADL training;Therapeutic activities;Patient/family education    OT Goals(Current goals can be found in the care plan section) Acute Rehab OT Goals Patient Stated Goal: To demonstrate daily tasks without fatigue OT Goal Formulation: With patient Time For Goal Achievement: 08/22/19 Potential to Achieve Goals: Fair  OT Frequency: Min 2X/week   Barriers to D/C:            Co-evaluation              AM-PAC OT "6 Clicks" Daily Activity     Outcome Measure Help from another person eating meals?: A Little Help from another person taking care of personal grooming?: A Little Help from another person toileting, which includes using toliet, bedpan, or urinal?: A  Little Help from another person bathing (including washing, rinsing, drying)?: A Little Help from another person to put on and taking off regular upper body clothing?: A Little Help from another person to put on and taking off regular lower body clothing?: A Little 6 Click Score: 18   End of Session Nurse Communication: Mobility status(RN reports patient complaining of dizziness is his baseline.)  Activity Tolerance: Other (comment);Patient limited by fatigue(limited by patient's reports of dizziness and wanting to lie down.) Patient left: in bed;with call bell/phone within reach;with bed alarm set  OT Visit Diagnosis: Other symptoms and signs involving cognitive function;History of falling (Z91.81)                Time: 9735-3299 OT Time Calculation (min): 12 min Charges:  OT General Charges $OT Visit: 1 Visit OT Evaluation $OT Eval Low Complexity: 1 Low  David [name], OTR/L Acute Care Rehab Services  Office [phone]  David [name] 08/08/2019, 12:35 PM

## 2019-08-08 NOTE — Telephone Encounter (Signed)
David [name] CSW Progress Notes  Email from [employer], patient started receiving in home personal care services from [facility] on 4/17 - receives  [place] from 9 - 1.  Services can resume on discharge.  Per [place]ice [place], they can offer increased services but patient has declined additional services thus far.  "Personal care assist with bathing, dressing, transfers (no lifting).  We can not help with tube feedings. Currently the caregiver is standing by outside the bathroom while he showers (he has not allowed her in the bathroom).  "  David [name], [place] Social Worker Phone:  [phone] Cell:  [phone]

## 2019-08-08 NOTE — Progress Notes (Addendum)
HEMATOLOGY-ONCOLOGY PROGRESS NOTE  SUBJECTIVE: The patient was just getting back in bed at the time my visit.  He reports some dizziness.  He otherwise states that he feels "groggy."  He has no other complaints this morning.  Oncology History  Primary cancer of lower third of esophagus ([place])  07/04/2019 Initial Diagnosis   Primary cancer of lower third of esophagus ([place])   08/12/2019 -  Chemotherapy   The patient had pembrolizumab (KEYTRUDA) 200 mg in sodium chloride 0.9 % 50 mL chemo infusion, 200 mg, Intravenous, Once, 0 of 6 cycles  for chemotherapy treatment.      PHYSICAL EXAMINATION:  Vitals:   08/08/19 0841 08/08/19 0844  BP: (!) 143/79 103/67  Pulse: 73 87  Resp:    Temp:    SpO2: 100% 99%   Filed Weights   08/07/19 0500 08/08/19 0500  Weight: 99.7 kg 101 kg    Intake/Output from previous day: 04/28 0701 - 04/29 0700 In: -  Out: 1175 [Urine:1175]  GENERAL:alert, no distress and comfortable OROPHARYNX: No thrush LUNGS: clear to auscultation and percussion with normal breathing effort HEART: regular rate & rhythm and no murmurs and no lower extremity edema ABDOMEN:abdomen soft, non-tender and normal bowel sounds, upper abdomen feeding tube without erythema or drainage  LABORATORY DATA:  I have reviewed the data as listed CMP Latest Ref Rng & Units 08/08/2019 08/07/2019 08/06/2019  Glucose 70 - 99 mg/dL 118(H) 118(H) 100(H)  BUN 8 - 23 mg/dL _0 Creatinine 0.61 - 1.24 mg/dL 1.04 1.30(H) 1.10  Sodium 135 - 145 mmol/L 135 135 134(L)  Potassium 3.5 - 5.1 mmol/L 4.1 4.4 4.1  Chloride 98 - 111 mmol/L 105 103 104  CO2 22 - 32 mmol/L 23 25 20(L)  Calcium 8.9 - 10.3 mg/dL 8.9 9.3 9.8  Total Protein 6.5 - 8.1 g/dL - 6.1(L) 6.7  Total Bilirubin 0.3 - 1.2 mg/dL - 1.1 1.4(H)  Alkaline Phos 38 - 126 U/L - 52 58  AST 15 - 41 U/L - 13(L) 18  ALT 0 - 44 U/L - 11 13    Lab Results  Component Value Date   WBC 6.1 08/08/2019   HGB 11.7 (L) 08/08/2019   HCT 36.2 (L)  08/08/2019   MCV 91.9 08/08/2019   PLT 129 (L) 08/08/2019   NEUTROABS 7.8 (H) 07/26/2019    CT HEAD WO CONTRAST  Result Date: 08/06/2019 CLINICAL DATA:  Recurrent syncope EXAM: CT HEAD WITHOUT CONTRAST TECHNIQUE: Contiguous axial images were obtained from the base of the skull through the vertex without intravenous contrast. COMPARISON:  05/06/2019 FINDINGS: Brain: There is no mass, hemorrhage or extra-axial collection. The appearance of the white matter is normal for the patient's age. There is generalized atrophy. Vascular: No abnormal hyperdensity of the major intracranial arteries or dural venous sinuses. No intracranial atherosclerosis. Skull: The visualized skull base, calvarium and extracranial soft tissues are normal. Sinuses/Orbits: No fluid levels or advanced mucosal thickening of the visualized paranasal sinuses. No mastoid or middle ear effusion. The orbits are normal. IMPRESSION: Generalized atrophy without acute intracranial abnormality. Electronically Signed   By: Ulyses Jarred M.D.   On: 08/06/2019 21:42   DG ABDOMEN PEG TUBE LOCATION  Result Date: 08/05/2019 CLINICAL DATA:  Peg tube evaluation. EXAM: ABDOMEN - 1 VIEW COMPARISON:  IR gastrostomy placement 07/08/2019 FINDINGS: A single frontal view of the abdomen is provided. A PEG tube balloon is seen in the left upper quadrant projecting over the stomach. There is contrast within the  stomach and proximal duodenum. No evidence of extraluminal contrast. The bowel gas pattern is nonobstructive. IMPRESSION: PEG tube balloon projects over the stomach. Contrast is seen within the stomach and proximal duodenum. No evidence of extraluminal contrast. Electronically Signed   By: Audie Pinto M.D.   On: 08/05/2019 18:06    ASSESSMENT AND PLAN: 1. Esophagus cancer ? CT 05/06/2019-circumferential thickening of the distal esophagus, mild splenomegaly, bladder wall thickening, enlarged prostate ? Upper endoscopy 06/11/2019-ulcerated mass at the  lower esophagus, 36 cm from the incisors, partially obstructing, biopsy revealed atypical cells-reactive change versus poorly differentiated neoplasm, outside consultation at [facility] on this biopsy and a repeat biopsy 06/21/2019 confirmed a poorly differentiated malignant neoplasm ? HER-2 negative, PD-L1 combined positive score -20 ? Repeat endoscopy 06/21/2019-partially obstructing esophageal tumor found in the lower third of the esophagus ? PET scan 07/05/2019-large esophageal mass with nodal involvement both above and below the diaphragm including a left periaortic node ? Palliative radiation to the esophagus mass starting 07/22/2019 2. Dementia 3. Anxiety/depression 4. Sleep apnea 5. BPH 6. Dysphagia secondary to #1  Gastrostomy tube placement 07/08/2019 7. Hyperlipidemia 8. Admission with hematemesis 07/21/2019 9. Admission for dehydration and pre-syncope in clinic 08/06/19   David [name] appears stable.  He continues on palliative radiation for severe dysphagia.  The dysphagia has partially improved.  He was admitted with presyncope likely due to dehydration.  He still reports some mild dizziness this morning.  Recommendations: 1.  Continue palliative radiation to the esophagus. 2.  Encourage increased fluid through his feeding tube and slowly cut back on IV hydration. 3.  Consider skilled nursing facility placement if he is unable to maintain hydration at home.   LOS: 0 days   Mikey Bussing, DNP, AGPCNP-BC, AOCNP 08/08/19   David [name] appears stable.  He will complete palliative radiation tomorrow.  There have been no episodes of bradycardia or syncope in the hospital.  He is tolerating liquids by mouth.  I recommend continuing tube feedings, hydration via the gastrostomy tube, and oral hydration as tolerated.  He can be discharged to home or a skilled nursing facility.  He is scheduled for outpatient follow-up and pembrolizumab at the cancer center next week.  I had a peer-to-peer  discussion with his Psychologist, occupational today in order to obtain approval for the pembrolizumab.

## 2019-08-08 NOTE — Progress Notes (Signed)
PT Cancellation Note  Patient Details Name: David [name] MRN: [id number] DOB: [birth year]   Cancelled Treatment:    Reason Eval/Treat Not Completed: Patient at procedure or test/unavailable(radiation)   York Ram E 08/08/2019, 2:31 PM Arlyce Dice, DPT Acute Rehabilitation Services Office: [phone]

## 2019-08-09 ENCOUNTER — Ambulatory Visit
Admission: RE | Admit: 2019-08-09 | Discharge: 2019-08-09 | Disposition: A | Discharge status: Home / Self Care | Payer: Medicare Other | Source: Ambulatory Visit | Attending: Radiation Oncology | Admitting: Radiation Oncology

## 2019-08-09 ENCOUNTER — Ambulatory Visit: Payer: Medicare Other

## 2019-08-09 ENCOUNTER — Inpatient Hospital Stay (HOSPITAL_COMMUNITY): Payer: Medicare Other

## 2019-08-09 ENCOUNTER — Encounter: Payer: Self-pay | Admitting: Radiation Oncology

## 2019-08-09 LAB — BASIC METABOLIC PANEL
Anion gap: 7 (ref 5–15)
BUN: 13 mg/dL (ref 8–23)
CO2: 24 mmol/L (ref 22–32)
Calcium: 9 mg/dL (ref 8.9–10.3)
Chloride: 106 mmol/L (ref 98–111)
Creatinine, Ser: 0.92 mg/dL (ref 0.61–1.24)
GFR calc Af Amer: >60 mL/min (ref >60)
GFR calc non Af Amer: >60 mL/min (ref >60)
Glucose, Bld: 105 mg/dL — ABNORMAL HIGH (ref 70–99)
Potassium: 4.1 mmol/L (ref 3.5–5.1)
Sodium: 137 mmol/L (ref 135–145)

## 2019-08-09 LAB — CBC
HCT: 36.5 % — ABNORMAL LOW (ref 39.0–52.0)
Hemoglobin: 11.8 g/dL — ABNORMAL LOW (ref 13.0–17.0)
MCH: 29.7 pg (ref 26.0–34.0)
MCHC: 32.3 g/dL (ref 30.0–36.0)
MCV: 91.9 fL (ref 80.0–100.0)
Platelets: 123 10*3/uL — ABNORMAL LOW (ref 150–400)
RBC: 3.97 MIL/uL — ABNORMAL LOW (ref 4.22–5.81)
RDW: 13.3 % (ref 11.5–15.5)
WBC: 5.9 10*3/uL (ref 4.0–10.5)
nRBC: 0 % (ref 0.0–0.2)

## 2019-08-09 LAB — GLUCOSE, CAPILLARY
Glucose-Capillary: 112 mg/dL — ABNORMAL HIGH (ref 70–99)
Glucose-Capillary: 94 mg/dL (ref 70–99)
Glucose-Capillary: 136 mg/dL — ABNORMAL HIGH (ref 70–99)
Glucose-Capillary: 99 mg/dL (ref 70–99)
Glucose-Capillary: 99 mg/dL (ref 70–99)

## 2019-08-09 LAB — CORTISOL: Cortisol, Plasma: 11.9 ug/dL

## 2019-08-09 LAB — MAGNESIUM: Magnesium: 2.2 mg/dL (ref 1.7–2.4)

## 2019-08-09 NOTE — TOC Initial Note (Addendum)
Transition of Care [facility]) - Initial/Assessment Note    Patient Details  Name: David [name] MRN: [id number] Date of Birth: [birth year]  Transition of Care [facility]) CM/SW Contact:    Trish Mage, LCSW Phone Number: 08/09/2019, 11:04 AM  Clinical Narrative:   Although PT and OT are both recommending [place] services, after consulting with multiple colleagues, I have submitted clinicals to insurance to see if it is possible for patient to qualify for SNF.  This is based on his wife's inability to provide the care needed in the home. TOC will continue to follow during the course of hospitalization.               Addendum:  Authorization requested.  Clinicals faxed. Reference # [id number]  Expected Discharge Plan: Home w Home Health Services Barriers to Discharge: (checking to see if insurance will authorize SNF)   Patient Goals and CMS Choice        Expected Discharge Plan and Services Expected Discharge Plan: [place]                                              Prior Living Arrangements/Services                       Activities of Daily Living Home Assistive Devices/Equipment: Eyeglasses, Other (Comment), Feeding equipment ADL Screening (condition at time of admission) Patient's cognitive ability adequate to safely complete daily activities?: No Is the patient deaf or have difficulty hearing?: No Does the patient have difficulty seeing, even when wearing glasses/contacts?: No Does the patient have difficulty concentrating, remembering, or making decisions?: Yes Patient able to express need for assistance with ADLs?: Yes Does the patient have difficulty dressing or bathing?: Yes Independently performs ADLs?: Yes (appropriate for developmental age) Does the patient have difficulty walking or climbing stairs?: Yes Weakness of Legs: Both Weakness of Arms/Hands: None  Permission Sought/Granted                  Emotional Assessment               Admission diagnosis:  Syncope and collapse [R55] Patient Active Problem List   Diagnosis Date Noted  . Orthostasis 08/07/2019  . Atypical syncope 08/06/2019  . Syncope and collapse 08/06/2019  . Goals of care, counseling/discussion 07/30/2019  . Hematemesis 07/21/2019  . Dysphagia 07/05/2019  . Dementia without behavioral disturbance ([place]) 07/05/2019  . AKI (acute kidney injury) ([place]) 07/05/2019  . Esophageal cancer ([place]) 07/05/2019  . Primary cancer of lower third of esophagus ([place]) 07/04/2019  . Major depressive disorder, recurrent episode, severe ([place]) 10/28/2017  . Generalized anxiety disorder 10/28/2017  . Dyslipidemia 07/10/2012  . Metabolic syndrome 50/93/2671  . Prostatic hypertrophy 07/10/2012  . Testosterone deficiency 07/10/2012  . Essential hypertension 06/27/2011  . MDD (major depressive disorder), recurrent episode, mild ([place]) 05/16/2011   PCP:  Leighton Ruff, MD Pharmacy:   [place], [place] [address] Phone: [phone] Fax: [phone]     Social Determinants of Health (SDOH) Interventions    Readmission Risk Interventions No flowsheet data found.

## 2019-08-09 NOTE — Progress Notes (Signed)
Patient's wife [name] calls stating that she heard he is being discharged today, she is very angry that no one has discussed this with her and feels she is unable to bring him home due to having problems with her special needs daughter that requires 24 hour care.  I spoke with Dr. Benay Spice and he will speak to the hospitalist about SNF placement.  I called her back and go her voice mail so I left her a message regarding this.

## 2019-08-09 NOTE — Progress Notes (Signed)
Received call from pt wife Rudene Anda stating she is upsetting regarding her husbands plan of care.  Wife states the MD has not contacted yesterday or today for an update on care and RN reported today that pt was going home.  Wife also upset with CSW as she was unhappy with the interaction with the CSW this AM.  Wife requesting follow up on PT eval, MD communication and designation on new CSW.  Requests acknowledged and will follow up soon with the wife after getting more information.  Wife approved of follow up plan.

## 2019-08-09 NOTE — TOC Progression Note (Signed)
Transition of Care [facility]) - Progression Note    Patient Details  Name: [name] MRN: [id number] Date of Birth: 1945/08/17  Transition [facility]) CM/SW Contact  Leeroy Cha, RN Phone Number: 08/09/2019, 1:27 PM  Clinical Narrative:    TCT-Wife and poa/is dealing with an austic child and the patient who now has dementia, expressed her frustration with the health care system.  Informed of the plan to see if [employer] will allow snf.  She was pleased with this.   Expected Discharge Plan: [place] Barriers to Discharge: (checking to see if insurance will authorize SNF)  Expected Discharge Plan and Services Expected Discharge Plan: [place]                                               Social Determinants of Health (SDOH) Interventions    Readmission Risk Interventions No flowsheet data found.

## 2019-08-09 NOTE — Progress Notes (Signed)
PROGRESS NOTE    Maron Stanzione [place]  FYB:[id number] DOB: Nov 20, 1945 DOA: 08/06/2019 PCP: Leighton Ruff, MD   Chief complaint: Dizziness  Brief Narrative:  HPI per Dr. Eddie [place] H Harnois is a 74 y.o. male with medical history significant of esophageal cancer on palliative chemotherapy, hypertension, dementia, hyperlipidemia, BPH and anxiety disorder who presented to the ER with intermittent dizziness.  Patient apparently has had issues with dizziness when he gets from laying to standing position.  He is almost passed out several he.  This has been going on and off for a long time but has gotten worse recently.  Patient went for his radiation therapy today when he became diaphoretic very dizzy and agitated.  Oncology service was contacted and directed patient to the ER where he was evaluated.  No recent illness.  No issues with his meals he gets tube feedings and comfort feeds by mouth.  His lab work shows no evidence of dehydration.  Work-up at this point was mostly uneventful.  Patient however continues to be dizzy.  He is being admitted for further work-up.  He is on medication for his dementia..  ED Course: Temperature is 98.3 blood pressure 151/95 pulse 110 respiratory 20 oxygen sats 96% on room air.  Sodium 134 potassium 4.1 chloride 104 CO2 of 20 with glucose 100 creatinine 1.1.  CBC within normal urinalysis essentially negative.  Head CT without contrast is ordered currently showing no acute findings.  Patient is being admitted with presyncope and recurrent dizziness.  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  Felt possibly could be secondary to medication related.  Head CT done negative for any metastatic disease or acute abnormalities.  Patient noted to be orthostatic on admission and still orthostatic today and symptomatic.  Aricept on hold as felt could lead to orthostatic hypotension.  Patient on IV fluids.  Change TED hose to thigh-high.  Place abdominal binder.  Repeat orthostasis in the morning.  Check MRI of the head.  If patient still orthostatic may consider starting patient on midodrine.  PT/OT.  2.  Dementia Continue to hold Aricept secondary to orthostasis.  Outpatient follow-up.  3.  History of esophageal cancer Patient noted to be on palliative radiation therapy.  Oncology informed of admission via epic.  Follow.  4.  Hypertension Patient noted to be orthostatic.  Patient currently on IV fluids.  Continue to hold antihypertensive medications as patient still orthostatic.  Patient on TED hose.  Will place abdominal binder.  If patient still orthostatic may need to be started on midodrine.  Follow.   5.  Hyperlipidemia Continue Zocor.    6.  Depression/GAD Wellbutrin, Lexapro.   7.  Moderate protein calorie malnutrition Likely secondary to esophageal cancer and poor oral intake.  Patient currently on home regimen of tube feeds.  On a soft diet.  Follow.   DVT prophylaxis: Lovenox Code Status: Full Family Communication: Updated patient, Updated wife via telephone. Disposition:   Status is: Observation    Dispo: The patient is from: Home  Anticipated d/c is to: Home with home health versus SNF              Anticipated d/c date is: Hopefully 1 to 2 days once clinically improved and asymptomatic.              Patient currently receiving IV fluids noted to still be orthostatic and symptomatic.       Consultants:   Oncology notified via epic of admission.  Procedures:   CT head 08/06/2019  MRI brain  pending  Antimicrobials:   None   Subjective: Patient patient with complaints of disorientation and still with dizziness from supine to standing.  Denies any chest pain.  No shortness of breath.  States he is tolerating diet.    Objective: Vitals:   08/09/19 0808 08/09/19 0810 08/09/19 0812 08/09/19 0825  BP: 135/74 136/79 107/79   Pulse: (!) 59 70 86   Resp: 18 20 18 16   Temp: 97.9 F (36.6 C)     TempSrc: Oral     SpO2: 98% 99% 100%   Weight:        Intake/Output Summary (Last 24 hours) at 08/09/2019 1153 Last data filed at 08/09/2019 1100 Gross per 24 hour  Intake 855 ml  Output 2075 ml  Net -1220 ml   Filed Weights   08/07/19 0500 08/08/19 0500 08/09/19 0403  Weight: 99.7 kg 101 kg 102.6 kg    Examination:  General exam: NAD  Respiratory system: Lungs clear to auscultation bilaterally.  No wheezes, no crackles, no rhonchi.  Normal respiratory effort.  Cardiovascular system: RRR no murmurs rubs or gallops.  No JVD.  No lower extremity edema.  Gastrointestinal system: Abdomen is nontender, nondistended, soft, positive bowel sounds.  No rebound.  No guarding.  PEG tube intact.   Central nervous system: Alert and oriented. No focal neurological deficits. Extremities: Symmetric 5 x 5 power. Skin: No rashes, lesions or ulcers Psychiatry: Judgement and insight appear normal. Mood & affect appropriate.     Data Reviewed: I have personally reviewed following labs and imaging studies  CBC: Recent Labs  Lab 08/05/19 1925 08/06/19 1948 08/07/19 0537 08/08/19 0649 08/09/19 0528  WBC 8.1 8.9 7.1 6.1 5.9  HGB 14.2 12.9* 12.2* 11.7* 11.8*  HCT 43.4 39.0 38.5* 36.2* 36.5*  MCV 89.5 89.7 92.5 91.9 91.9  PLT 194 190 146* 129* 123*    Basic Metabolic Panel: Recent Labs  Lab 08/05/19 1925 08/06/19 1948 08/07/19 0537 08/08/19 0649 08/09/19 0528  NA 133* 134* 135 135 137  K 4.3 4.1 4.4 4.1 4.1  CL 101 104 103 105 106  CO2 22 20* 25 23 24   GLUCOSE 117* 100* 118*  118* 105*  BUN 18 18 18 16 13   CREATININE 1.02 1.10 1.30* 1.04 0.92  CALCIUM 9.7 9.8 9.3 8.9 9.0  MG  --   --   --   --  2.2    GFR: Estimated Creatinine Clearance: 92.8 mL/min (by C-G formula based on SCr of 0.92 mg/dL).  Liver Function Tests: Recent Labs  Lab 08/06/19 1948 08/07/19 0537  AST 18 13*  ALT 13 11  ALKPHOS 58 52  BILITOT 1.4* 1.1  PROT 6.7 6.1*  ALBUMIN 3.8 3.4*    CBG: Recent Labs  Lab 08/08/19 2015 08/08/19 2359 08/09/19 0401 08/09/19 0751 08/09/19 1146  GLUCAP 120* 96 112* 94 136*     No results found for this or any previous visit (from the past 240 hour(s)).  Radiology Studies: No results found.      Scheduled Meds: . buPROPion  150 mg Oral Daily  . chlorhexidine  15 mL Mouth Rinse BID  . enoxaparin (LOVENOX) injection  40 mg Subcutaneous Q24H  . escitalopram  20 mg Oral Daily  . feeding supplement (ENSURE ENLIVE)  237 mL Oral BID BM  . feeding supplement (KATE FARMS STANDARD 1.4)  1,300 mL Per Tube Q24H  . mouth rinse  15 mL Mouth Rinse q12n4p  . pantoprazole sodium  40 mg Oral Daily  . simvastatin  40 mg Oral Daily  . sodium chloride flush  3 mL Intravenous Q12H  . vitamin B-12  1,000 mcg Oral Daily   Continuous Infusions: . sodium chloride 100 mL/hr at 08/09/19 1040     LOS: 1 day    Time spent: 35 minutes    Irine Seal, MD [facility]   To contact the attending provider between 7A-7P or the covering provider during after hours 7P-7A, please log into the web site [email or web address] and access using universal [place] password for that web site. If you do not have the password, please call the hospital operator.  08/09/2019, 11:53 AM

## 2019-08-09 NOTE — Progress Notes (Signed)
Followed up with pt wife Rudene Anda regarding concerns noted earlier today.  Alerted wife that PT had completed the eval and recommended HH PT with limited supervision at home.  Providers aware that wife stating she cannot care for him at home and requesting Rehab.  CSW aware and are in the process of contacting pt insurance to determine options.  Dr Grandville Silos has rounded on the pt and states will contact the wife after seeing a couple more pts. I notified wife that I visited pt after previous call and pt was asleep so I did not want to wake him, however, I was present when the MD was in the room and pt was comfortable with no concerns.  Wife was appreciative of follow up, however, request CSW [place] be removed from her husbands case.  Notified that I will arrange new assignment of CSW.  No other issues at this time.

## 2019-08-09 NOTE — Evaluation (Signed)
Physical Therapy Evaluation Patient Details Name: David [name] MRN: [id number] DOB: 1945/11/29 Today's Date: 08/09/2019   History of Present Illness  74 y.o. male with medical history significant of esophageal cancer on palliative chemotherapy, hypertension, dementia, hyperlipidemia, BPH and anxiety disorder who presented to the ER with intermittent dizziness.  Patient apparently has had issues with dizziness when he gets from laying to standing position. admitted with presyncope likely secondary to orthostasis. CT head negative  Clinical Impression  Pt admitted with above diagnosis. * Pt cooperative, reports he is only hurting "mentally".   Mobility limited by dizziness/orthostasis. See VS for BP. Will continue to follow. Recommend HHPT at d/c  Pt currently with functional limitations due to the deficits listed below (see PT Problem List). Pt will benefit from skilled PT to increase their independence and safety with mobility to allow discharge to the venue listed below.       Follow Up Recommendations Home health PT;Supervision - Intermittent    Equipment Recommendations  None recommended by PT    Recommendations for Other Services       Precautions / Restrictions Precautions Precautions: Fall Precaution Comments: G tube, orthostatic Restrictions Weight Bearing Restrictions: No      Mobility  Bed Mobility Overal bed mobility: Needs Assistance Bed Mobility: Supine to Sit;Sit to Supine     Supine to sit: Supervision Sit to supine: Supervision   General bed mobility comments: increased time, no physical assist  Transfers Overall transfer level: Needs assistance Equipment used: None Transfers: Sit to/from Stand Sit to Stand: Supervision         General transfer comment: for safety. repeated x2, no physical assist  Ambulation/Gait             General Gait Details: deferred d/t pt c/o dizziness, orthostatic  Stairs            Wheelchair Mobility     Modified Rankin (Stroke Patients Only)       Balance Overall balance assessment: No apparent balance deficits (not formally assessed)(recent admissions, no noted balance deficits per PT notes with amb)                                           Pertinent Vitals/Pain Pain Assessment: No/denies pain    Home Living Family/patient expects to be discharged to:: Private residence Living Arrangements: Alone Available Help at Discharge: Family;Available PRN/intermittently Type of Home: Other(Comment)(condo) Home Access: Stairs to enter   Entrance Stairs-Number of Steps: 2nd floor unit Home Layout: One level   Additional Comments: per OT eval pt is separtated from his wife however she assists him iwth household tasks and managing appointments    Prior Function Level of Independence: Independent         Comments: Reports independence with adls and ambulation. Per case management note patient getting in personal care services Mon - Thurs from 9 - 1.     Hand Dominance        Extremity/Trunk Assessment   Upper Extremity Assessment Upper Extremity Assessment: Overall WFL for tasks assessed    Lower Extremity Assessment Lower Extremity Assessment: Overall WFL for tasks assessed    Cervical / Trunk Assessment Cervical / Trunk Assessment: Normal  Communication   Communication: No difficulties  Cognition Arousal/Alertness: Awake/alert Behavior During Therapy: WFL for tasks assessed/performed Overall Cognitive Status: History of cognitive impairments - at baseline  General Comments: hx of dementia, impaired memory. follows commands and is appropriate during PT session      General Comments      Exercises     Assessment/Plan    PT Assessment Patient needs continued PT services  PT Problem List Decreased strength;Decreased mobility;Decreased knowledge of use of DME       PT Treatment Interventions  Gait training;Therapeutic exercise;DME instruction;Therapeutic activities;Functional mobility training;Stair training;Balance training;Patient/family education    PT Goals (Current goals can be found in the Care Plan section)  Acute Rehab PT Goals Patient Stated Goal: To demonstrate daily tasks without fatigue PT Goal Formulation: With patient Time For Goal Achievement: 08/23/19 Potential to Achieve Goals: Good    Frequency Min 3X/week   Barriers to discharge        Co-evaluation               AM-PAC PT "6 Clicks" Mobility  Outcome Measure Help needed turning from your back to your side while in a flat bed without using bedrails?: None Help needed moving from lying on your back to sitting on the side of a flat bed without using bedrails?: None Help needed moving to and from a bed to a chair (including a wheelchair)?: A Little Help needed standing up from a chair using your arms (e.g., wheelchair or bedside chair)?: None Help needed to walk in hospital room?: A Little Help needed climbing 3-5 steps with a railing? : A Lot 6 Click Score: 20    End of Session Equipment Utilized During Treatment: Gait belt Activity Tolerance: Treatment limited secondary to medical complications (Comment)(orthostatic) Patient left: in bed;with call bell/phone within reach;with bed alarm set Nurse Communication: Mobility status PT Visit Diagnosis: Difficulty in walking, not elsewhere classified (R26.2)    Time: 1020-1035 PT Time Calculation (min) (ACUTE ONLY): 15 min   Charges:   PT Evaluation $PT Eval Low Complexity: [place], PT   Acute Rehab Dept [place]): [phone]   08/09/2019   [facility] 08/09/2019, 10:46 AM

## 2019-08-09 NOTE — Progress Notes (Signed)
08/09/2019  1800  Abdominal binder and thigh high TED Hose applied.

## 2019-08-09 NOTE — Progress Notes (Signed)
New TOC staff member assigned to pt case and has spoken with pt wife.  Grandville Silos MD has followed up with wife via phone regarding plan of care and wife agreeable.  Followed up with wife per her request.  She was appreciative of the follow up and hopes there are no further communication breakdowns during pts stay.  Leadership committed to following up with nursing staff members to ensure they are informed on pts new plan of care.  No other issues at this time.

## 2019-08-09 NOTE — Plan of Care (Signed)
  Problem: Activity: Goal: Risk for activity intolerance will decrease Outcome: Progressing   Problem: Nutrition: Goal: Adequate nutrition will be maintained Outcome: Progressing   Problem: Elimination: Goal: Will not experience complications related to bowel motility Outcome: Progressing   

## 2019-08-10 DIAGNOSIS — R531 Weakness: Secondary | ICD-10-CM

## 2019-08-10 DIAGNOSIS — Z7189 Other specified counseling: Secondary | ICD-10-CM

## 2019-08-10 DIAGNOSIS — Z515 Encounter for palliative care: Secondary | ICD-10-CM

## 2019-08-10 LAB — CBC
HCT: 36.6 % — ABNORMAL LOW (ref 39.0–52.0)
Hemoglobin: 11.9 g/dL — ABNORMAL LOW (ref 13.0–17.0)
MCH: 29.5 pg (ref 26.0–34.0)
MCHC: 32.5 g/dL (ref 30.0–36.0)
MCV: 90.8 fL (ref 80.0–100.0)
Platelets: 122 10*3/uL — ABNORMAL LOW (ref 150–400)
RBC: 4.03 MIL/uL — ABNORMAL LOW (ref 4.22–5.81)
RDW: 13.3 % (ref 11.5–15.5)
WBC: 5.3 10*3/uL (ref 4.0–10.5)
nRBC: 0 % (ref 0.0–0.2)

## 2019-08-10 LAB — BASIC METABOLIC PANEL
Anion gap: 5 (ref 5–15)
BUN: 11 mg/dL (ref 8–23)
CO2: 24 mmol/L (ref 22–32)
Calcium: 8.8 mg/dL — ABNORMAL LOW (ref 8.9–10.3)
Chloride: 105 mmol/L (ref 98–111)
Creatinine, Ser: 0.79 mg/dL (ref 0.61–1.24)
GFR calc Af Amer: >60 mL/min (ref >60)
GFR calc non Af Amer: >60 mL/min (ref >60)
Glucose, Bld: 120 mg/dL — ABNORMAL HIGH (ref 70–99)
Potassium: 3.9 mmol/L (ref 3.5–5.1)
Sodium: 134 mmol/L — ABNORMAL LOW (ref 135–145)

## 2019-08-10 LAB — GLUCOSE, CAPILLARY
Glucose-Capillary: 112 mg/dL — ABNORMAL HIGH (ref 70–99)
Glucose-Capillary: 122 mg/dL — ABNORMAL HIGH (ref 70–99)
Glucose-Capillary: 127 mg/dL — ABNORMAL HIGH (ref 70–99)
Glucose-Capillary: 131 mg/dL — ABNORMAL HIGH (ref 70–99)
Glucose-Capillary: 88 mg/dL (ref 70–99)
Glucose-Capillary: 95 mg/dL (ref 70–99)

## 2019-08-10 MED ORDER — MIDODRINE HCL 5 MG PO TABS
5.0000 mg | ORAL_TABLET | Freq: Three times a day (TID) | ORAL | Status: DC
  Administered 2019-08-10 – 2019-08-12 (×6): 5 mg via ORAL
  Filled 2019-08-10 (×7): qty 1

## 2019-08-10 NOTE — Consult Note (Signed)
Palliative medicine team brief consultation note.  Palliative medicine consultation request received, goals of care.  Patient known to palliative service seen in previous hospitalization.  74 year old gentleman with serious illness of esophageal cancer with ongoing palliative radiation being followed by Dr. Duard Brady gastrostomy tube placement.  Patient with underlying history of dementia, anxiety/depression and sleep apnea.  Patient admitted with presyncope deemed likely secondary to dehydration.  Also found to have orthostatic hypotension.  Undergoing IV fluid another medical management remains admitted to hospital medicine service also seen and followed by medical oncology in this hospitalization.  Chart reviewed thoroughly.  Also discussed with TRH MD.  Discussed with patient.  He appears to be resting comfortably.  He states that he feels he is confused.  He denies any pain denies any shortness of breath.    BP (!) 158/79 (BP Location: Right Arm)   Pulse 65   Temp 98.4 F (36.9 C) (Oral)   Resp 20   Wt 102 kg   SpO2 100%   BMI 28.87 kg/m  Labs and imaging noted.  Awake reasonably alert resting comfortably Regular work of breathing Abdomen not distended has feeding tube No edema S1-S2  Palliative performance scale 40%  Physical therapy notes reviewed transitions of care notes reviewed.  Discussed with TRH MD.  Full code/full scope.  Completing palliative radiation.  Being considered for skilled nursing facility rehabilitation attempt.  Recommend palliative services following the patient over there.  Greater than 50% of this time was spent counseling and coordinating care related to the above assessment and plan.  Patient's wife not at bedside at the time of my visit early in the morning as well as on second rounds as well.  Discussed with bedside RN.  Patient's wife has corresponded with bedside nursing as well as with social work today.  No other palliative specific  recommendations at this time other than rehabilitation attempt with palliative to follow.  Thank you for the consult.  Time in 1300 Time out Scissors.

## 2019-08-10 NOTE — Progress Notes (Signed)
PROGRESS NOTE    David [name]  JHE:[id number] DOB: 08/29/1945 DOA: 08/06/2019 PCP: Leighton Ruff, MD   Chief complaint: Dizziness  Brief Narrative:  HPI per Dr. Eddie North H David [name] is a 74 y.o. male with medical history significant of esophageal cancer on palliative chemotherapy, hypertension, dementia, hyperlipidemia, BPH and anxiety disorder who presented to the ER with intermittent dizziness.  David [name] apparently has had issues with dizziness when he gets from laying to standing position.  He is almost passed out several he.  This has been going on and off for a long time but has gotten worse recently.  David [name] went for his radiation therapy today when he became diaphoretic very dizzy and agitated.  Oncology service was contacted and directed David [name] to the ER where he was evaluated.  No recent illness.  No issues with his meals he gets tube feedings and comfort feeds by mouth.  His lab work shows no evidence of dehydration.  Work-up at this point was mostly uneventful.  David [name] however continues to be dizzy.  He is being admitted for further work-up.  He is on medication for his dementia..  ED Course: Temperature is 98.3 blood pressure 151/95 pulse 110 respiratory 20 oxygen sats 96% on room air.  Sodium 134 potassium 4.1 chloride 104 CO2 of 20 with glucose 100 creatinine 1.1.  CBC within normal urinalysis essentially negative.  Head CT without contrast is ordered currently showing no acute findings.  David [name] is being admitted with presyncope and recurrent dizziness.  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  Felt possibly could be secondary to medication related.  Head CT done negative for any metastatic disease or acute abnormalities.  David [name] noted to be orthostatic on admission and still orthostatic today and symptomatic.  Aricept on hold as felt could lead to orthostatic hypotension.  Per wife David [name]'s Aricept and Namenda were discontinued in the outpatient setting due to concerns for orthostasis as well.  David [name] was on IV fluids which was saline lock at this time.  David [name] still orthostatic and symptomatic.  Continue TED hose, abdominal binder.  MRI head negative for any acute abnormalities.  2D echo with a normal EF of 60 to 65% with no wall motion abnormalities and normal right ventricle per cardiology (results from 2D echo not crossing over into chart).  Will place on midodrine 5 mg p.o. 3 times daily.  Repeat orthostatics in the morning. PT/OT.  2.  Dementia Aricept on hold secondary to orthostasis.  Per David [name]'s wife David [name]'s Aricept and Namenda were discontinued due to concerns for orthostasis.  Outpatient follow-up with neurology.   3.  History of esophageal cancer David [name] noted to be on palliative radiation therapy.  Oncology informed of admission via epic and has been following during the hospitalization.  Follow.  4.  Hypertension David [name] noted to be orthostatic.  David [name] currently on IV fluids.  Continue to hold antihypertensive medications as David [name] still orthostatic.  David [name] on TED hose.  Will place abdominal binder.  If David [name] still orthostatic may need to be started on midodrine.  Follow.   5.  Hyperlipidemia  Continue Zocor.    6.  Depression/GAD Continue Lexapro, Wellbutrin.   7.  Moderate protein calorie malnutrition Likely secondary to esophageal cancer and poor oral intake.  David [name] currently on home regimen of tube feeds.  On a soft diet.  Follow.   DVT prophylaxis: Lovenox Code Status: Full Family Communication: Updated  David [name],  Disposition:   Status is: Observation    Dispo: The David [name] is from: Home              Anticipated d/c is to: Home with home health versus SNF              Anticipated d/c date is: Hopefully 1 to 2 days once clinically improved and asymptomatic.              David [name] currently receiving IV fluids noted to still be orthostatic and symptomatic.       Consultants:   Oncology notified via epic of admission/Dr. Benay Spice following  Procedures:   CT head 08/06/2019  MRI brain 08/09/2019  2D echo 08/07/2019  Antimicrobials:   None   Subjective: David [name] sitting up at the side of the bed.  Complaining of disorientation.  David [name] with complaints of dizziness and lightheadedness again from supine to standing position.  David [name] noted to still be orthostatic.  Denies any chest pain.  No shortness of breath.  Tolerating current soft diet.  Objective: Vitals:   08/10/19 0010 08/10/19 0407 08/10/19 0412 08/10/19 0833  BP: (!) 144/85 135/79  (!) 153/86  Pulse: 72 68  70  Resp: 16 17    Temp: 98.3 F (36.8 C) 97.8 F (36.6 C)    TempSrc: Oral Oral    SpO2: 99% 96%    Weight:   102 kg     Intake/Output Summary (Last 24 hours) at 08/10/2019 1129 Last data filed at 08/10/2019 1123 Gross per 24 hour  Intake 2119.78 ml  Output 1345 ml  Net 774.78 ml   Filed Weights   08/08/19 0500 08/09/19 0403 08/10/19 0412  Weight: 101 kg 102.6 kg 102 kg    Examination:  General exam: NAD  Respiratory system: CTAB.  No wheezes, no crackles, no rhonchi.  Normal respiratory effort.  Cardiovascular system: RRR no murmurs rubs or gallops.  No JVD.  No lower extremity edema.  Gastrointestinal system: Abdomen is soft, nontender, nondistended, positive bowel sounds.  No rebound.  No guarding.  PEG tube intact.    Central nervous system: Alert and oriented. No focal neurological deficits. Extremities: Symmetric 5 x 5 power. Skin: No rashes, lesions or ulcers Psychiatry: Judgement and  insight appear normal. Mood & affect appropriate.     Data Reviewed: I have personally reviewed following labs and imaging studies  CBC: Recent Labs  Lab 08/06/19 1948 08/07/19 0537 08/08/19 0649 08/09/19 0528 08/10/19 0642  WBC 8.9 7.1 6.1 5.9 5.3  HGB 12.9* 12.2* 11.7* 11.8* 11.9*  HCT 39.0 38.5* 36.2* 36.5* 36.6*  MCV 89.7 92.5 91.9 91.9 90.8  PLT 190 146* 129* 123* 122*    Basic Metabolic Panel: Recent Labs  Lab 08/06/19 1948 08/07/19 0537 08/08/19 0649 08/09/19 0528 08/10/19 0642  NA 134* 135 135 137 134*  K 4.1 4.4 4.1 4.1 3.9  CL 104 103 105 106 105  CO2 20* 25 23 24 24   GLUCOSE 100* 118* 118* 105* 120*  BUN 18 18 16 13 11   CREATININE 1.10 1.30* 1.04 0.92 0.79  CALCIUM 9.8 9.3 8.9 9.0 8.8*  MG  --   --   --  2.2  --     GFR: Estimated Creatinine Clearance: 106.4 mL/min (by C-G formula based on SCr of 0.79 mg/dL).  Liver Function Tests: Recent Labs  Lab 08/06/19 1948 08/07/19 0537  AST 18 13*  ALT 13 11  ALKPHOS 58 52  BILITOT 1.4* 1.1  PROT 6.7 6.1*  ALBUMIN 3.8 3.4*    CBG: Recent Labs  Lab 08/09/19 1612 08/09/19 1924 08/10/19 0011 08/10/19 0409 08/10/19 0810  GLUCAP 99 99 95 122* 112*     No results found for this or any previous visit (from the past 240 hour(s)).       Radiology Studies: MR BRAIN WO CONTRAST  Result Date: 08/09/2019 CLINICAL DATA:  Dizziness, dehydration or hypotension. Additional history provided: Medical history significant for esophageal cancer on palliative chemotherapy, hypertension, dementia, hyperlipidemia, BPH, anxiety disorder presenting with intermittent dizziness. EXAM: MRI HEAD WITHOUT CONTRAST TECHNIQUE: Multiplanar, multiecho pulse sequences of the brain and surrounding structures were obtained without intravenous contrast. COMPARISON:  Noncontrast head CT 08/06/2019, brain MRI 10/28/2017. FINDINGS: Brain: Please note evaluation for intracranial metastatic disease is limited on this noncontrast  study. There are few scattered punctate foci of T2/FLAIR hyperintensity within the cerebral white matter, unchanged and nonspecific. Stable moderate correct atrophy with somewhat disproportionate involvement of the medial temporal lobes. There is no acute infarct. No evidence of intracranial mass. No chronic intracranial blood products. No extra-axial fluid collection. No midline shift. Vascular: Expected proximal arterial flow voids. Skull and upper cervical spine: No focal marrow lesion. Sinuses/Orbits: Visualized orbits show no acute finding. Minimal ethmoid sinus mucosal thickening. No significant mastoid effusion. IMPRESSION: 1. Please note evaluation for intracranial metastatic disease is limited on this non-contrast exam. 2. No evidence of acute intracranial abnormality. 3. Stable moderate parenchymal atrophy with somewhat disproportionate involvement of the medial temporal lobes. 4. A few scattered punctate T2 hyperintense signal changes within the cerebral white matter are unchanged and nonspecific. 5. Minimal ethmoid sinus mucosal thickening. Electronically Signed   By: Kellie Simmering DO   On: 08/09/2019 15:38        Scheduled Meds: . buPROPion  150 mg Oral Daily  . chlorhexidine  15 mL Mouth Rinse BID  . enoxaparin (LOVENOX) injection  40 mg Subcutaneous Q24H  . escitalopram  20 mg Oral Daily  . feeding supplement (ENSURE ENLIVE)  237 mL Oral BID BM  . feeding supplement (KATE FARMS STANDARD 1.4)  1,300 mL Per Tube Q24H  . mouth rinse  15 mL Mouth Rinse q12n4p  . pantoprazole sodium  40 mg Oral Daily  . simvastatin  40 mg Oral Daily  . sodium chloride flush  3 mL Intravenous Q12H  . vitamin B-12  1,000 mcg Oral Daily   Continuous Infusions:    LOS: 2 days    Time spent: 35 minutes    Irine Seal, MD [facility]   To contact the attending provider between 7A-7P or the covering provider during after hours 7P-7A, please log into the web site [email or web address] and access  using universal  password for that web site. If you do not have the password, please call the hospital operator.  08/10/2019, 11:29 AM

## 2019-08-10 NOTE — Progress Notes (Signed)
CSW received a call from pt's RN stating pt's wife would like an update.  CSW reviewed chart and per notes PT recommended [facility], pt's wife not happy with this plan and TOC was in contact with [employer] to determine options.  CSW called [employer] at ph: [phone] for updates and was told pt's insurance auth request has been received and is pending at this time.  CSW called pt's wife and updated her.  Pt's wife was appreciative and thanked the CSW.  CSW will continue to follow for D/C needs.  Alphonse Guild. Hazim Treadway  MSW, LCSW, LCAS, CSI Transitions of Care Clinical Social Worker Care Coordination Department Ph: [phone]

## 2019-08-11 LAB — BASIC METABOLIC PANEL
Anion gap: 5 (ref 5–15)
BUN: 14 mg/dL (ref 8–23)
CO2: 26 mmol/L (ref 22–32)
Calcium: 9.4 mg/dL (ref 8.9–10.3)
Chloride: 104 mmol/L (ref 98–111)
Creatinine, Ser: 1 mg/dL (ref 0.61–1.24)
GFR calc Af Amer: >60 mL/min (ref >60)
GFR calc non Af Amer: >60 mL/min (ref >60)
Glucose, Bld: 135 mg/dL — ABNORMAL HIGH (ref 70–99)
Potassium: 4.6 mmol/L (ref 3.5–5.1)
Sodium: 135 mmol/L (ref 135–145)

## 2019-08-11 LAB — GLUCOSE, CAPILLARY
Glucose-Capillary: 102 mg/dL — ABNORMAL HIGH (ref 70–99)
Glucose-Capillary: 106 mg/dL — ABNORMAL HIGH (ref 70–99)
Glucose-Capillary: 113 mg/dL — ABNORMAL HIGH (ref 70–99)
Glucose-Capillary: 114 mg/dL — ABNORMAL HIGH (ref 70–99)
Glucose-Capillary: 118 mg/dL — ABNORMAL HIGH (ref 70–99)
Glucose-Capillary: 119 mg/dL — ABNORMAL HIGH (ref 70–99)
Glucose-Capillary: 93 mg/dL (ref 70–99)

## 2019-08-11 NOTE — Plan of Care (Signed)
  Problem: Clinical Measurements: Goal: Will remain free from infection Outcome: Progressing   Problem: Clinical Measurements: Goal: Diagnostic test results will improve Outcome: Progressing   Problem: Clinical Measurements: Goal: Respiratory complications will improve Outcome: Progressing   Problem: Clinical Measurements: Goal: Cardiovascular complication will be avoided Outcome: Progressing   Problem: Nutrition: Goal: Adequate nutrition will be maintained Outcome: Progressing   Problem: Coping: Goal: Level of anxiety will decrease Outcome: Progressing   Problem: Pain Managment: Goal: General experience of comfort will improve Outcome: Progressing   Problem: Safety: Goal: Ability to remain free from injury will improve Outcome: Progressing

## 2019-08-11 NOTE — Progress Notes (Signed)
PROGRESS NOTE    David [name]  WCH:[id number] DOB: 08-24-1945 DOA: 08/06/2019 PCP: Leighton Ruff, MD   Chief complaint: Dizziness  Brief Narrative:  HPI per Dr. Eddie North David [name] is a 74 y.o. male with medical history significant of esophageal cancer on palliative chemotherapy, hypertension, dementia, hyperlipidemia, BPH and anxiety disorder who presented to the ER with intermittent dizziness.  Patient apparently has had issues with dizziness when he gets from laying to standing position.  He is almost passed out several he.  This has been going on and off for a long time but has gotten worse recently.  Patient went for his radiation therapy today when he became diaphoretic very dizzy and agitated.  Oncology service was contacted and directed patient to the ER where he was evaluated.  No recent illness.  No issues with his meals he gets tube feedings and comfort feeds by mouth.  His lab work shows no evidence of dehydration.  Work-up at this point was mostly uneventful.  Patient however continues to be dizzy.  He is being admitted for further work-up.  He is on medication for his dementia..  ED Course: Temperature is 98.3 blood pressure 151/95 pulse 110 respiratory 20 oxygen sats 96% on room air.  Sodium 134 potassium 4.1 chloride 104 CO2 of 20 with glucose 100 creatinine 1.1.  CBC within normal urinalysis essentially negative.  Head CT without contrast is ordered currently showing no acute findings.  Patient is being admitted with presyncope and recurrent dizziness.  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  Felt possibly could be secondary to medication related.  Head CT done negative for any metastatic disease or acute abnormalities.  Patient noted to be orthostatic on admission and still orthostatic today however improving symptomatically after being started on midodrine.  Aricept on hold as felt could lead to orthostatic hypotension.  Per wife patient's Aricept and Namenda were discontinued in the outpatient setting due to concerns for orthostasis as well.  Patient was on IV fluids which is saline lock at this time.  MRI head negative for any acute abnormalities.  2D echo with a normal EF of 60 to 65% with no wall motion abnormalities and normal right ventricle per cardiology (results from 2D echo not crossing over into chart).  Continue TED hose, abdominal binder, midodrine 5 mg p.o. 3 times daily.  Repeat orthostatics in the morning.  PT/OT.    2.  Dementia Aricept and Namenda on hold secondary to orthostasis.  Per patient's wife patient's Aricept and Namenda were discontinued due to concerns for orthostasis.  Outpatient follow-up with neurology.   3.  History of esophageal cancer Patient noted to be on palliative radiation therapy.  Oncology informed of admission via epic and has been following during the hospitalization.  Follow.  4.  Hypertension Patient noted to be orthostatic.  Patient was on IV fluids.  Patient still noted to be orthostatic despite TED hose placement and abdominal binder.  IV fluids discontinued as patient is euvolemic.  Patient started on midodrine 5 mg 3 times daily for orthostasis.  Follow.  5.  Hyperlipidemia Zocor.    6.  Depression/GAD Wellbutrin, Lexapro.   7.  Moderate protein calorie malnutrition Likely secondary to esophageal cancer and poor oral intake.  Patient currently on home regimen of tube feeds.  On a soft diet.  Follow.   DVT prophylaxis: Lovenox Code Status: Full Family Communication: Updated patient,    Disposition:   Status is: Observation    Dispo: The patient is from: Home              Anticipated d/c is to: Home with home health versus SNF              Anticipated d/c date is: Hopefully 1 to 2 days once clinically improved and asymptomatic.              Patient currently with symptomatic orthostasis with institution of medication and titration to help with symptomatic orthostasis.         Consultants:   Oncology notified via epic of admission/Dr. Benay Spice following  Procedures:   CT head 08/06/2019  MRI brain 08/09/2019  2D echo 08/07/2019  Antimicrobials:   None   Subjective: Patient standing up at sink brushing his teeth.  States still with some complaints of lightheadedness and dizziness however improving over the past 24 hours.  Denies any chest pain or shortness of breath.  Patient still noted to be orthostatic this morning however slowly improving clinically.   Objective: Vitals:   08/10/19 2017 08/11/19 0420 08/11/19 0725 08/11/19 1208  BP: (!) 143/83 124/67 (!) 153/83 (!) 144/89  Pulse: 73 63 65 76  Resp: 16 16 18 18   Temp: 98.2 F (36.8 C) 98 F (36.7 C) 98.5 F (36.9 C) (!) 97.5 F (36.4 C)  TempSrc: Oral Oral Oral Oral  SpO2: 100% 98% 100% 100%  Weight:  103.3 kg      Intake/Output Summary (Last 24 hours) at 08/11/2019 1215 Last data filed at 08/11/2019 1023 Gross per 24 hour  Intake 480 ml  Output 500 ml  Net -20 ml   Filed Weights   08/09/19 0403 08/10/19 0412 08/11/19 0420  Weight: 102.6 kg 102 kg 103.3 kg    Examination:  General exam: NAD  Respiratory system: Lungs clear to auscultation bilaterally.  No wheezes, no crackles, no rhonchi.  Normal respiratory effort.  Cardiovascular system: RRR no murmurs rubs or gallops.  No JVD.  No lower extremity edema.  Gastrointestinal system: Abdomen is nontender, nondistended, soft, positive bowel sounds.  No rebound.  No guarding.  PEG tube intact.  Central nervous system: Alert, oriented.  No  focal neurological deficits.  Extremities: Symmetric 5 x 5 power. Skin: No rashes, lesions or ulcers Psychiatry: Judgement and insight appear normal. Mood & affect appropriate.     Data Reviewed: I have personally reviewed following labs and imaging studies  CBC: Recent Labs  Lab 08/06/19 1948 08/07/19 0537 08/08/19 0649 08/09/19 0528 08/10/19 0642  WBC 8.9 7.1 6.1 5.9 5.3  HGB 12.9* 12.2* 11.7* 11.8* 11.9*  HCT 39.0 38.5* 36.2* 36.5* 36.6*  MCV 89.7 92.5 91.9 91.9 90.8  PLT 190 146* 129* 123* 122*    Basic Metabolic Panel: Recent Labs  Lab 08/07/19 0537 08/08/19 0649 08/09/19 0528 08/10/19 0642 08/11/19 0516  NA 135 135 137 134* 135  K 4.4 4.1 4.1 3.9 4.6  CL 103 105 106 105 104  CO2 25 23 24 24 26   GLUCOSE 118* 118* 105* 120* 135*  BUN 18 16 13 11 14   CREATININE 1.30*  1.04 0.92 0.79 1.00  CALCIUM 9.3 8.9 9.0 8.8* 9.4  MG  --   --  2.2  --   --     GFR: Estimated Creatinine Clearance: 85.6 mL/min (by C-G formula based on SCr of 1 mg/dL).  Liver Function Tests: Recent Labs  Lab 08/06/19 1948 08/07/19 0537  AST 18 13*  ALT 13 11  ALKPHOS 58 52  BILITOT 1.4* 1.1  PROT 6.7 6.1*  ALBUMIN 3.8 3.4*    CBG: Recent Labs  Lab 08/10/19 2020 08/11/19 0002 08/11/19 0418 08/11/19 0727 08/11/19 1125  GLUCAP 131* 118* 119* 113* 106*     No results found for this or any previous visit (from the past 240 hour(s)).       Radiology Studies: MR BRAIN WO CONTRAST  Result Date: 08/09/2019 CLINICAL DATA:  Dizziness, dehydration or hypotension. Additional history provided: Medical history significant for esophageal cancer on palliative chemotherapy, hypertension, dementia, hyperlipidemia, BPH, anxiety disorder presenting with intermittent dizziness. EXAM: MRI HEAD WITHOUT CONTRAST TECHNIQUE: Multiplanar, multiecho pulse sequences of the brain and surrounding structures were obtained without intravenous contrast. COMPARISON:  Noncontrast head CT 08/06/2019,  brain MRI 10/28/2017. FINDINGS: Brain: Please note evaluation for intracranial metastatic disease is limited on this noncontrast study. There are few scattered punctate foci of T2/FLAIR hyperintensity within the cerebral white matter, unchanged and nonspecific. Stable moderate correct atrophy with somewhat disproportionate involvement of the medial temporal lobes. There is no acute infarct. No evidence of intracranial mass. No chronic intracranial blood products. No extra-axial fluid collection. No midline shift. Vascular: Expected proximal arterial flow voids. Skull and upper cervical spine: No focal marrow lesion. Sinuses/Orbits: Visualized orbits show no acute finding. Minimal ethmoid sinus mucosal thickening. No significant mastoid effusion. IMPRESSION: 1. Please note evaluation for intracranial metastatic disease is limited on this non-contrast exam. 2. No evidence of acute intracranial abnormality. 3. Stable moderate parenchymal atrophy with somewhat disproportionate involvement of the medial temporal lobes. 4. A few scattered punctate T2 hyperintense signal changes within the cerebral white matter are unchanged and nonspecific. 5. Minimal ethmoid sinus mucosal thickening. Electronically Signed   By: Kellie Simmering DO   On: 08/09/2019 15:38        Scheduled Meds: . buPROPion  150 mg Oral Daily  . chlorhexidine  15 mL Mouth Rinse BID  . enoxaparin (LOVENOX) injection  40 mg Subcutaneous Q24H  . escitalopram  20 mg Oral Daily  . feeding supplement (ENSURE ENLIVE)  237 mL Oral BID BM  . feeding supplement (KATE FARMS STANDARD 1.4)  1,300 mL Per Tube Q24H  . mouth rinse  15 mL Mouth Rinse q12n4p  . midodrine  5 mg Oral TID WC  . pantoprazole sodium  40 mg Oral Daily  . simvastatin  40 mg Oral Daily  . sodium chloride flush  3 mL Intravenous Q12H  . vitamin B-12  1,000 mcg Oral Daily   Continuous Infusions:    LOS: 3 days    Time spent: 35 minutes    Irine Seal, MD [facility]   To contact the attending provider between 7A-7P or the covering provider during after hours 7P-7A, please log into the web site [email or web address] and access using universal  password for that web site. If you do not have the password, please call the hospital operator.  08/11/2019, 12:15 PM

## 2019-08-11 NOTE — Progress Notes (Signed)
Occupational Therapy Treatment Patient Details Name: David [name] MRN: [id number] DOB: [birth year] Today's Date: [name]    History of present illness 74 y.o. male with medical history significant of esophageal cancer on palliative chemotherapy, hypertension, dementia, hyperlipidemia, BPH and anxiety disorder who presented to the ER with intermittent dizziness.  Patient apparently has had issues with dizziness when he gets from laying to standing position. admitted with presyncope likely secondary to orthostasis. CT head negative   OT comments  Pt sleeping upon OT arrival but woke easily and agreeable to OT  Follow Up Recommendations  Home health OT;Supervision/Assistance - 24 hour    Equipment Recommendations  None recommended by OT    Recommendations for Other Services      Precautions / Restrictions Precautions Precautions: Fall Restrictions Weight Bearing Restrictions: No       Mobility Bed Mobility Overal bed mobility: Needs Assistance Bed Mobility: Supine to Sit     Supine to sit: Supervision     General bed mobility comments: increased time  Transfers Overall transfer level: Needs assistance Equipment used: Rolling walker (2 wheeled) Transfers: Sit to/from [facility] Sit to Stand: Min assist Stand pivot transfers: Min assist       General transfer comment: for safety. repeated x2, no physical assist    Balance Overall balance assessment: Mild deficits observed, not formally tested                                         ADL either performed or assessed with clinical judgement   ADL Overall ADL's : Needs assistance/impaired     Grooming: Supervision/safety;Standing                   Toilet Transfer: Minimal assistance;RW;Cueing for safety;Cueing for sequencing   Toileting- Clothing Manipulation and Hygiene: Minimal assistance;Sit to/from stand;Cueing for safety;Cueing for compensatory techniques        Functional mobility during ADLs: Minimal assistance General ADL Comments: increased time. Pt was sleeping upon OT arrival but did wake and agree to OOB and grooming     Vision Patient Visual Report: No change from baseline            Cognition Arousal/Alertness: Awake/alert Behavior During Therapy: WFL for tasks assessed/performed Overall Cognitive Status: History of cognitive impairments - at baseline                                                     Pertinent Vitals/ Pain       Pain Assessment: No/denies pain         Frequency  Min 2X/week        Progress Toward Goals  OT Goals(current goals can now be found in the care plan section)  Progress towards OT goals: Progressing toward goals     Plan Discharge plan remains appropriate       AM-PAC OT "6 Clicks" Daily Activity     Outcome Measure   Help from another person eating meals?: A Little Help from another person taking care of personal grooming?: A Little Help from another person toileting, which includes using toliet, bedpan, or urinal?: A Little Help from another person bathing (including washing, rinsing, drying)?: A Little Help from another person to put  on and taking off regular upper body clothing?: A Little Help from another person to put on and taking off regular lower body clothing?: A Little 6 Click Score: 18    End of Session Equipment Utilized During Treatment: Rolling walker;Gait belt  OT Visit Diagnosis: Other symptoms and signs involving cognitive function;History of falling (Z91.81)   Activity Tolerance Other (comment);Patient tolerated treatment well(limited by patient's reports of dizziness and wanting to lie down.)   Patient Left with call bell/phone within reach;in chair;with chair alarm set   Nurse Communication Mobility status(RN reports patient complaining of dizziness is his baseline.)        Time: 1140-1202 OT Time Calculation (min): 22  min  Charges: OT General Charges $OT Visit: 1 Visit OT Treatments $Self Care/Home Management : 8-22 mins  Kari Baars, [place] [phone] Office- [place], David [name], David [name]

## 2019-08-12 ENCOUNTER — Ambulatory Visit: Payer: Medicare Other

## 2019-08-12 ENCOUNTER — Inpatient Hospital Stay: Payer: Medicare Other

## 2019-08-12 ENCOUNTER — Inpatient Hospital Stay: Payer: Medicare Other | Admitting: Nurse Practitioner

## 2019-08-12 LAB — BASIC METABOLIC PANEL
Anion gap: 6 (ref 5–15)
BUN: 16 mg/dL (ref 8–23)
CO2: 26 mmol/L (ref 22–32)
Calcium: 9.5 mg/dL (ref 8.9–10.3)
Chloride: 102 mmol/L (ref 98–111)
Creatinine, Ser: 0.94 mg/dL (ref 0.61–1.24)
GFR calc Af Amer: >60 mL/min (ref >60)
GFR calc non Af Amer: >60 mL/min (ref >60)
Glucose, Bld: 133 mg/dL — ABNORMAL HIGH (ref 70–99)
Potassium: 4.5 mmol/L (ref 3.5–5.1)
Sodium: 134 mmol/L — ABNORMAL LOW (ref 135–145)

## 2019-08-12 LAB — GLUCOSE, CAPILLARY
Glucose-Capillary: 133 mg/dL — ABNORMAL HIGH (ref 70–99)
Glucose-Capillary: 104 mg/dL — ABNORMAL HIGH (ref 70–99)
Glucose-Capillary: 127 mg/dL — ABNORMAL HIGH (ref 70–99)
Glucose-Capillary: 111 mg/dL — ABNORMAL HIGH (ref 70–99)
Glucose-Capillary: 102 mg/dL — ABNORMAL HIGH (ref 70–99)

## 2019-08-12 MED ORDER — MIDODRINE HCL 5 MG PO TABS
7.5000 mg | ORAL_TABLET | Freq: Three times a day (TID) | ORAL | Status: DC
  Administered 2019-08-12 (×2): 7.5 mg via ORAL
  Filled 2019-08-12 (×3): qty 1

## 2019-08-12 NOTE — TOC Progression Note (Signed)
Transition of Care [facility]) - Progression Note    Patient Details  Name: David [name] MRN: [id number] Date of Birth: [birth year]  [facility] CM/SW Contact  Leeroy Cha, RN Phone Number: 08/12/2019, 2:26 PM  Clinical Narrative:    Per Roque Lias LCSW insurance may approve short term rehab starting tomorrow .  Fl2 sent out to area snf .  TCT-Wife-informed her of progress and she is interested in [facility] or [place] [facility].   Expected Discharge Plan: [place] Barriers to Discharge: (checking to see if insurance will authorize SNF)  Expected Discharge Plan and Services Expected Discharge Plan: [place]                                               Social Determinants of Health (SDOH) Interventions    Readmission Risk Interventions No flowsheet data found.

## 2019-08-12 NOTE — Progress Notes (Signed)
PROGRESS NOTE    David [name]  AYT:[id number] DOB: 09/17/1945 DOA: 08/06/2019 PCP: Leighton Ruff, MD   Chief complaint: Dizziness  Brief Narrative:  HPI per Dr. Eddie North David [name] is a 74 y.o. male with medical history significant of esophageal cancer on palliative chemotherapy, hypertension, dementia, hyperlipidemia, BPH and anxiety disorder who presented to the ER with intermittent dizziness.  Patient apparently has had issues with dizziness when he gets from laying to standing position.  He is almost passed out several he.  This has been going on and off for a long time but has gotten worse recently.  Patient went for his radiation therapy today when he became diaphoretic very dizzy and agitated.  Oncology service was contacted and directed patient to the ER where he was evaluated.  No recent illness.  No issues with his meals he gets tube feedings and comfort feeds by mouth.  His lab work shows no evidence of dehydration.  Work-up at this point was mostly uneventful.  Patient however continues to be dizzy.  He is being admitted for further work-up.  He is on medication for his dementia..  ED Course: Temperature is 98.3 blood pressure 151/95 pulse 110 respiratory 20 oxygen sats 96% on room air.  Sodium 134 potassium 4.1 chloride 104 CO2 of 20 with glucose 100 creatinine 1.1.  CBC within normal urinalysis essentially negative.  Head CT without contrast is ordered currently showing no acute findings.  Patient is being admitted with presyncope and recurrent dizziness.  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  Felt possibly could be secondary to medication related.  Head CT done negative for any metastatic disease or acute abnormalities.  Patient noted to be orthostatic on admission and still orthostatic today however improving symptomatically after being started on midodrine.  Aricept on hold as felt could lead to orthostatic hypotension.  Per wife patient's Aricept and Namenda were discontinued in the outpatient setting due to concerns for orthostasis as well.  Patient was on IV fluids which is saline lock at this time.  MRI head negative for any acute abnormalities.  2D echo with a normal EF of 60 to 65% with no wall motion abnormalities and normal right ventricle per cardiology (results from 2D echo not crossing over into chart).  Continue TED hose, abdominal binder.  Increase midodrine to 7.5 mg p.o. 3 times daily.  Repeat orthostatics in the morning.  PT/OT.  Follow.  2.  Dementia Aricept and Namenda on hold secondary to orthostasis.  Per patient's wife patient's Aricept and Namenda were discontinued due to concerns for orthostasis.  Outpatient follow-up with neurology.   3.  History of esophageal cancer Patient noted to be on palliative radiation therapy.  Oncology informed of admission via epic and has been following during the hospitalization.  Outpatient follow-up with oncology.  Follow.  4.  Hypertension Patient noted to be orthostatic.  Improving symptomatically.  Patient was on IV fluids.  Patient still noted to be orthostatic despite TED hose placement and abdominal binder.  IV fluids discontinued as patient is euvolemic.  Patient started on midodrine  5 mg 3 times daily for orthostasis increase to 7.5 mg 3 times daily.  Follow.    5.  Hyperlipidemia Zocor.    6.  Depression/GAD Continue Wellbutrin, Lexapro.   7.  Moderate protein calorie malnutrition Likely secondary to esophageal cancer and poor oral intake.  Patient currently on home regimen of  tube feeds.  On a soft diet.  Follow.   DVT prophylaxis: Lovenox Code Status: Full Family Communication: Updated patient,  Disposition:   Status is: Observation    Dispo: The patient is from: Home              Anticipated d/c is to: Home with home health versus SNF              Anticipated d/c date is: Hopefully tomorrow.               Patient currently with symptomatic orthostasis with institution of medication and titration to help with symptomatic orthostasis.  Awaiting SNF placement hopefully should be ready tomorrow       Consultants:   Oncology notified via epic of admission/Dr. Benay Spice following  Procedures:   CT head 08/06/2019  MRI brain 08/09/2019  2D echo 08/07/2019  Antimicrobials:   None   Subjective: Patient laying in bed.  Patient laying in bed still with some complaints of dizziness and lightheadedness however states it is improving.  No chest pain.  No shortness of breath.  Tolerating current diet.   Objective: Vitals:   08/12/19 0924 08/12/19 0927 08/12/19 0929 08/12/19 0930  BP: 123/86 92/66 131/87 136/89  Pulse: 87 (!) 105 93 75  Resp: 20 20 (!) 22 (!) 22  Temp: 97.8 F (36.6 C) 97.8 F (36.6 C) 97.8 F (36.6 C) 97.9 F (36.6 C)  TempSrc: Oral Oral Oral   SpO2: 100% 100% 100% 100%  Weight:        Intake/Output Summary (Last 24 hours) at 08/12/2019 1258 Last data filed at 08/12/2019 0955 Gross per 24 hour  Intake 1455 ml  Output 500 ml  Net 955 ml   Filed Weights   08/10/19 0412 08/11/19 0420 08/12/19 0425  Weight: 102 kg 103.3 kg 99 kg    Examination:  General exam: NAD  Respiratory system: Clear to auscultation bilaterally.  No wheezes, no crackles, no rhonchi.  Normal respiratory effort.   Cardiovascular system: Regular rate rhythm no murmurs rubs or gallops.  No JVD.  No lower extremity edema. Gastrointestinal system: Abdomen is soft, nontender, nondistended, positive bowel sounds.  No rebound.  No guarding.  PEG tube intact.    Central nervous system: Alert, oriented.  No focal neurological deficits.  Extremities: Symmetric 5 x 5 power. Skin: No rashes, lesions or ulcers Psychiatry: Judgement and insight appear normal. Mood & affect appropriate.     Data Reviewed: I have personally reviewed following labs and imaging studies  CBC: Recent Labs  Lab 08/06/19 1948 08/07/19 0537 08/08/19 0649 08/09/19 0528 08/10/19 0642  WBC 8.9 7.1 6.1 5.9 5.3  HGB 12.9* 12.2* 11.7* 11.8* 11.9*  HCT 39.0 38.5* 36.2* 36.5* 36.6*  MCV 89.7 92.5 91.9 91.9 90.8  PLT 190 146* 129* 123* 122*    Basic Metabolic Panel: Recent Labs  Lab 08/08/19 0649 08/09/19 0528 08/10/19 0642 08/11/19 0516 08/12/19 0553  NA 135 137 134* 135 134*  K 4.1 4.1 3.9 4.6 4.5  CL 105 106 105 104 102  CO2 23 24 24 26 26   GLUCOSE 118* 105* 120* 135* 133*  BUN 16  13 11 14 16   CREATININE 1.04 0.92 0.79 1.00 0.94  CALCIUM 8.9 9.0 8.8* 9.4 9.5  MG  --  2.2  --   --   --     GFR: Estimated Creatinine Clearance: 89.3 mL/min (by C-G formula based on SCr of 0.94 mg/dL).  Liver Function Tests: Recent Labs  Lab 08/06/19 1948 08/07/19 0537  AST 18 13*  ALT 13 11  ALKPHOS 58 52  BILITOT 1.4* 1.1  PROT 6.7 6.1*  ALBUMIN 3.8 3.4*    CBG: Recent Labs  Lab 08/11/19 2008 08/11/19 2349 08/12/19 0428 08/12/19 0742 08/12/19 1143  GLUCAP 114* 102* 133* 104* 127*     No results found for this or any previous visit (from the past 240 hour(s)).       Radiology Studies: No results found.      Scheduled Meds: . buPROPion  150 mg Oral Daily  . chlorhexidine  15 mL Mouth Rinse BID  . enoxaparin (LOVENOX) injection  40 mg Subcutaneous Q24H  . escitalopram  20 mg Oral Daily  . feeding supplement (ENSURE ENLIVE)  237 mL Oral BID BM  . feeding supplement (KATE FARMS STANDARD 1.4)  1,300 mL Per Tube Q24H  . mouth rinse  15 mL Mouth Rinse q12n4p  . midodrine  7.5 mg Oral TID WC  . pantoprazole sodium  40 mg Oral Daily  .  simvastatin  40 mg Oral Daily  . sodium chloride flush  3 mL Intravenous Q12H  . vitamin B-12  1,000 mcg Oral Daily   Continuous Infusions:    LOS: 4 days    Time spent: 35 minutes    Irine Seal, MD [facility]   To contact the attending provider between 7A-7P or the covering provider during after hours 7P-7A, please log into the web site [email or web address] and access using universal [place] password for that web site. If you do not have the password, please call the hospital operator.  08/12/2019, 12:58 PM

## 2019-08-12 NOTE — Plan of Care (Signed)
  Problem: Nutrition: Goal: Adequate nutrition will be maintained Outcome: Progressing   Problem: Coping: Goal: Level of anxiety will decrease Outcome: Progressing   Problem: Elimination: Goal: Will not experience complications related to bowel motility Outcome: Progressing   Problem: Activity: Goal: Risk for activity intolerance will decrease Outcome: Progressing

## 2019-08-12 NOTE — TOC Progression Note (Addendum)
Transition of Care [facility]) - Progression Note    Patient Details  Name: Dekendrick Uzelac [place] MRN: [id number] Date of Birth: [birth year]  Transition of Care [facility]) CM/SW [place], [place] Phone Number: 08/12/2019, 12:40 PM  Clinical Narrative:  Received request from [facility] for updated clinicals on patient.  Sent vis FAX. TOC will continue to follow during the course of hospitalization.  Addendum:  Received call back from [employer] indicating they would likely approve patient for short term rehab and wondering if patient had chosen facility.  I alerted Velva Harman, [facility], who is helping out on this case.  Please call [employer] to let them know facility of patient/wife's choice.      Expected Discharge Plan: [place] Barriers to Discharge: (checking to see if insurance will authorize SNF)  Expected Discharge Plan and Services Expected Discharge Plan: [place]                                               Social Determinants of Health (SDOH) Interventions    Readmission Risk Interventions No flowsheet data found.

## 2019-08-12 NOTE — Progress Notes (Signed)
08/12/2019  1544 Returned wife's call. Updated her on orthostatic v/s and that lunch had been ordered ( soup and grill cheese sandwich). She states she will be here after work to see him.

## 2019-08-12 NOTE — NC FL2 (Signed)
[place] LEVEL OF CARE SCREENING TOOL     IDENTIFICATION  Patient Name: David [name] Birthdate: Apr 14, 1945 Sex: male Admission Date (Current Location): 08/06/2019  [facility] and [place] Number:  Herbalist and Address:  [facility],  [address], [place]      Provider Number: [id number]  Attending Physician Name and Address:  Eugenie Filler, MD  Relative Name and Phone Number:       Current Level of Care: Hospital Recommended Level of Care: [place] Prior Approval Number:    Date Approved/Denied:   PASRR Number: [id number]  Discharge Plan: SNF    Current Diagnoses: Patient Active Problem List   Diagnosis Date Noted  . Orthostasis 08/07/2019  . Atypical syncope 08/06/2019  . Syncope and collapse 08/06/2019  . Goals of care, counseling/discussion 07/30/2019  . Hematemesis 07/21/2019  . Dysphagia 07/05/2019  . Dementia without behavioral disturbance ([place]) 07/05/2019  . AKI (acute kidney injury) ([place]) 07/05/2019  . Esophageal cancer ([place]) 07/05/2019  . Primary cancer of lower third of esophagus ([place]) 07/04/2019  . Major depressive disorder, recurrent episode, severe ([place]) 10/28/2017  . Generalized anxiety disorder 10/28/2017  . Dyslipidemia 07/10/2012  . Metabolic syndrome 97/05/6376  . Prostatic hypertrophy 07/10/2012  . Testosterone deficiency 07/10/2012  . Essential hypertension 06/27/2011  . MDD (major depressive disorder), recurrent episode, mild ([place]) 05/16/2011    Orientation RESPIRATION BLADDER Height & Weight     Self, Time, Situation, Place  Normal Continent Weight: 99 kg Height:     BEHAVIORAL SYMPTOMS/MOOD NEUROLOGICAL BOWEL NUTRITION STATUS      Continent Diet(regular)  AMBULATORY STATUS COMMUNICATION OF NEEDS Skin   Extensive Assist Verbally Normal                       Personal Care Assistance Level of Assistance  Bathing, Feeding, Dressing Bathing Assistance:  Limited assistance Feeding assistance: Limited assistance Dressing Assistance: Limited assistance     Functional Limitations Info  Sight, Hearing, Speech Sight Info: Adequate Hearing Info: Adequate Speech Info: Adequate    SPECIAL CARE FACTORS FREQUENCY  PT (By licensed PT)     PT Frequency: 5 x weekly              Contractures Contractures Info: Not present    Additional Factors Info  Code Status Code Status Info: FULL             Current Medications (08/12/2019):  This is the current hospital active medication list Current Facility-Administered Medications  Medication Dose Route Frequency Provider Last Rate Last Admin  . acetaminophen (TYLENOL) tablet 650 mg  650 mg Oral Q6H PRN Elwyn Reach, MD       Or  . acetaminophen (TYLENOL) suppository 650 mg  650 mg Rectal Q6H PRN Gala Romney L, MD      . buPROPion (WELLBUTRIN XL) 24 hr tablet 150 mg  150 mg Oral Daily Eugenie Filler, MD   150 mg at 08/12/19 0955  . chlorhexidine (PERIDEX) 0.12 % solution 15 mL  15 mL Mouth Rinse BID Gala Romney L, MD   15 mL at 08/12/19 0955  . enoxaparin (LOVENOX) injection 40 mg  40 mg Subcutaneous Q24H Gala Romney L, MD   40 mg at 08/11/19 2330  . escitalopram (LEXAPRO) tablet 20 mg  20 mg Oral Daily Eugenie Filler, MD   20 mg at 08/12/19 0954  . feeding supplement (ENSURE ENLIVE) (ENSURE ENLIVE) liquid  237 mL  237 mL Oral BID BM Eugenie Filler, MD   237 mL at 08/12/19 1400  . feeding supplement (KATE FARMS STANDARD 1.4) liquid 1,300 mL  1,300 mL Per Tube Q24H Eugenie Filler, MD   1,300 mL at 08/11/19 1810  . HYDROcodone-acetaminophen (NORCO/VICODIN) 5-325 MG per tablet 1 tablet  1 tablet Oral Q6H PRN Eugenie Filler, MD      . LORazepam (ATIVAN) tablet 0.5 mg  0.5 mg Oral Q6H PRN Eugenie Filler, MD   0.5 mg at 08/11/19 1612  . MEDLINE mouth rinse  15 mL Mouth Rinse q12n4p Gala Romney L, MD   15 mL at 08/12/19 1300  . midodrine (PROAMATINE) tablet  7.5 mg  7.5 mg Oral TID WC Eugenie Filler, MD   7.5 mg at 08/12/19 1300  . ondansetron (ZOFRAN) tablet 4 mg  4 mg Oral Q6H PRN Elwyn Reach, MD       Or  . ondansetron (ZOFRAN) injection 4 mg  4 mg Intravenous Q6H PRN Elwyn Reach, MD   4 mg at 08/10/19 0940  . pantoprazole sodium (PROTONIX) 40 mg/20 mL oral suspension 40 mg  40 mg Oral Daily Eugenie Filler, MD   40 mg at 08/12/19 0954  . simvastatin (ZOCOR) tablet 40 mg  40 mg Oral Daily Eugenie Filler, MD   40 mg at 08/12/19 0955  . sodium chloride flush (NS) 0.9 % injection 3 mL  3 mL Intravenous Q12H Elwyn Reach, MD   3 mL at 08/12/19 0955  . vitamin B-12 (CYANOCOBALAMIN) tablet 1,000 mcg  1,000 mcg Oral Daily Eugenie Filler, MD   1,000 mcg at 08/12/19 2774     Discharge Medications: Please see discharge summary for a list of discharge medications.  Relevant Imaging Results:  Relevant Lab Results:   Additional Information JOI:[id number]  Leeroy Cha, RN

## 2019-08-13 ENCOUNTER — Ambulatory Visit: Payer: Medicare Other

## 2019-08-13 LAB — BASIC METABOLIC PANEL
Anion gap: 11 (ref 5–15)
BUN: 17 mg/dL (ref 8–23)
CO2: 22 mmol/L (ref 22–32)
Calcium: 10.3 mg/dL (ref 8.9–10.3)
Chloride: 102 mmol/L (ref 98–111)
Creatinine, Ser: 0.84 mg/dL (ref 0.61–1.24)
GFR calc Af Amer: >60 mL/min (ref >60)
GFR calc non Af Amer: >60 mL/min (ref >60)
Glucose, Bld: 119 mg/dL — ABNORMAL HIGH (ref 70–99)
Potassium: 4.4 mmol/L (ref 3.5–5.1)
Sodium: 135 mmol/L (ref 135–145)

## 2019-08-13 LAB — GLUCOSE, CAPILLARY
Glucose-Capillary: 106 mg/dL — ABNORMAL HIGH (ref 70–99)
Glucose-Capillary: 110 mg/dL — ABNORMAL HIGH (ref 70–99)
Glucose-Capillary: 111 mg/dL — ABNORMAL HIGH (ref 70–99)
Glucose-Capillary: 121 mg/dL — ABNORMAL HIGH (ref 70–99)
Glucose-Capillary: 137 mg/dL — ABNORMAL HIGH (ref 70–99)
Glucose-Capillary: 90 mg/dL (ref 70–99)

## 2019-08-13 LAB — RESPIRATORY PANEL BY RT PCR (FLU A&B, COVID)
Influenza A by PCR: NEGATIVE
Influenza B by PCR: NEGATIVE
SARS Coronavirus 2 by RT PCR: NEGATIVE

## 2019-08-13 MED ORDER — LORAZEPAM 0.5 MG PO TABS: 0.5000 mg | ORAL_TABLET | Freq: Four times a day (QID) | ORAL | 0 refills | Status: DC | PRN

## 2019-08-13 MED ORDER — HYDROCODONE-ACETAMINOPHEN 5-325 MG PO TABS: 1.0000 | ORAL_TABLET | Freq: Four times a day (QID) | ORAL | 0 refills | Status: DC | PRN

## 2019-08-13 MED ORDER — MIDODRINE HCL 10 MG PO TABS: 10.0000 mg | ORAL_TABLET | Freq: Three times a day (TID) | ORAL | 1 refills | Status: DC

## 2019-08-13 MED ORDER — MIDODRINE HCL 5 MG PO TABS
10.0000 mg | ORAL_TABLET | Freq: Three times a day (TID) | ORAL | Status: DC
  Administered 2019-08-13 (×3): 10 mg via ORAL
  Filled 2019-08-13 (×4): qty 2

## 2019-08-13 NOTE — Progress Notes (Signed)
Discharged patient to [facility] at 2140 via [facility]

## 2019-08-13 NOTE — TOC Progression Note (Addendum)
Transition of Care [facility]) - Progression Note    Patient Details  Name: David [name] MRN: [id number] Date of Birth: [birth year]  Transition of Care [facility]) CM/SW Contact  Leeroy Cha, RN Phone Number: 08/13/2019, 9:04 AM  Clinical Narrative:    1962 ptar called for transport. Wife notified of dc. Dc summary faxed to [facility] attention: Juliann Pulse. PT WILL GO TO ROOM 103p/ NUMBER TO CALL REPORT TO IS [phone]. Approved to go to [facility] for rehab Josem Kaufmann number is [id number].  Folloow up repres.  Darnelle Bos.  Dr. Eli Hose noitified will notify wife. tct-wife would like patient transported after 34 pm today so she can be there. tct-kathy campbell- can come today before 10pm/will need new rapid covid test done/Dr. Grandville Silos notified. Expected Discharge Plan: [place] Barriers to Discharge: (checking to see if insurance will authorize SNF)  Expected Discharge Plan and Services Expected Discharge Plan: [place]                                               Social Determinants of Health (SDOH) Interventions    Readmission Risk Interventions No flowsheet data found.

## 2019-08-13 NOTE — Progress Notes (Signed)
Report  Called to [facility] at this time and given to Olam Idler LPN

## 2019-08-13 NOTE — Discharge Summary (Signed)
Physician Discharge Summary  David [name] IRS:[id number] DOB: June 21, 1945 DOA: 08/06/2019  PCP: Leighton Ruff, MD  Admit date: 08/06/2019 Discharge date: 08/13/2019  Time spent: 60 minutes  Recommendations for Outpatient Follow-up:  1. Patient will be discharged to a skilled nursing facility.  Follow-up with MD at skilled nursing facility.  Patient will need a basic metabolic profile done in 1 week to follow-up on electrolytes and renal function. 2. Follow-up with Dr. Benay Spice, oncology as scheduled on 08/15/2019. 3. Follow-up with [name], neurology as scheduled.   Discharge Diagnoses:  Principal Problem:   Syncope and collapse Active Problems:   Major depressive disorder, recurrent episode, severe (HCC)   Generalized anxiety disorder   Dyslipidemia   Essential hypertension   Primary cancer of lower third of esophagus ([place])   Dementia without behavioral disturbance ([place])   Atypical syncope   Orthostasis   Discharge Condition: Stable and improved  Diet recommendation: Soft diet  Filed Weights   08/11/19 0420 08/12/19 0425 08/13/19 0427  Weight: 103.3 kg 99 kg 98.5 kg    History of present illness:  HPI per Dr. Eddie [place] H Colden is a 74 y.o. male with medical history significant of esophageal cancer on palliative chemotherapy, hypertension, dementia, hyperlipidemia, BPH and anxiety disorder who presented to the ER with intermittent dizziness.  Patient apparently has had issues with dizziness when he gets from laying to standing position.  He almost passed out several he.  This has been going on and off for a long time but had gotten worse recently.  Patient went for his radiation therapy on day of admission, when he became diaphoretic very dizzy and agitated.  Oncology service was contacted and directed patient to the ER where he was evaluated.  No recent illness.  No issues with his meals he gets tube feedings and comfort feeds by mouth.  His lab work shows no  evidence of dehydration.  Work-up at this point was mostly uneventful.  Patient however continued to be dizzy.  He was being admitted for further work-up.  He is on medication for his dementia..  ED Course: Temperature is 98.3 blood pressure 151/95 pulse 110 respiratory 20 oxygen sats 96% on room air.  Sodium 134 potassium 4.1 chloride 104 CO2 of 20 with glucose 100 creatinine 1.1.  CBC within normal urinalysis essentially negative.  Head CT without contrast is ordered currently showing no acute findings.  Patient is being admitted with presyncope and recurrent dizziness.  Hospital Course:  1 presyncope likely secondary to orthostasis Patient presented with dizziness and noted with near syncope.  Felt initially possibly could be secondary to medication related.  Head CT done negative for any metastatic disease or acute abnormalities.  Patient noted to be orthostatic on admission.  Patient's Aricept and Namenda were held during the hospitalization as they could potentially lead to orthostatic hypotension. Per wife patient's Aricept and Namenda were discontinued in the outpatient setting due to concerns for orthostasis as well.  Patient was hydrated with IV fluids and was euvolemic.  CT head which was done was unremarkable for any acute abnormalities.  MRI head negative for any acute abnormalities.  2D echo with a normal EF of 60 to 65% with no wall motion abnormalities and normal right ventricle per cardiology (results from 2D echo not crossing over into chart).    Patient was placed on TED hose as well as an abdominal binder however continued to be orthostatic and symptomatic and as such patient was started on midodrine and  dose uptitrated to 10 mg 3 times daily with clinical improvement.  Patient will be discharged to a skilled nursing facility.  Outpatient follow-up.   2.  Dementia Aricept and Namenda were held during the hospitalization secondary to orthostasis.  Per patient's wife patient's Aricept  and Namenda were discontinued due to concerns for orthostasis.  Outpatient follow-up with neurology.   3.  History of esophageal cancer Patient noted to be on palliative radiation therapy.  Oncology informed of admission via epic and followed the patient throughout the hospitalization.  Patient will follow up with oncology in the outpatient setting as scheduled on 08/15/2019.    4.  Hypertension Patient noted to be orthostatic.  Patient was hydrated with IV fluids however noted to still be orthostatic.  TED hose were placed as well as abdominal binder.  IV fluids discontinued as patient was euvolemic.  Despite TED hose and abdominal binder patient was still orthostatic and symptomatic and as such patient was started on midodrine and dose uptitrated to 10 mg 3 times daily.  Patient improved clinically.  Patient be discharged to a skilled nursing facility.  Outpatient follow-up.   5.  Hyperlipidemia Patient maintained on home regimen of Zocor.    6.  Depression/GAD Patient maintained on home regimen of Wellbutrin, Lexapro.   7.  Moderate protein calorie malnutrition Likely secondary to esophageal cancer and poor oral intake.  Patient was on tube feeds prior to admission which was continued during the hospitalization.  Patient was started on a liquid diet which was advanced to a soft diet which was tolerated by day of discharge.  Outpatient follow-up.      Procedures:  CT head 08/06/2019  MRI brain 08/09/2019  2D echo 08/07/2019  Consultations:  Oncology notified via epic of admission/Dr. Benay Spice following  Discharge Exam: Vitals:   08/13/19 0359 08/13/19 1332  BP: 132/76 (!) 140/91  Pulse: 80 66  Resp: 16 18  Temp: 97.7 F (36.5 C) 97.9 F (36.6 C)  SpO2: 100% 100%    General: NAD Cardiovascular: RRR Respiratory: CTAB  Discharge Instructions   Discharge Instructions    Diet general   Complete by: As directed    Soft diet   Increase activity slowly   Complete by:  As directed      Allergies as of 08/13/2019      Reactions   Other    Other reaction(s): Other Sleep walk    Abilify [aripiprazole] Other (See Comments)   Short term memory loss   Lamictal [lamotrigine]    Increased agitation      Medication List    STOP taking these medications   donepezil 5 MG tablet Commonly known as: ARICEPT   memantine 10 MG tablet Commonly known as: NAMENDA     TAKE these medications   buPROPion 150 MG 24 hr tablet Commonly known as: WELLBUTRIN XL TAKE 1 TABLET ONCE DAILY.   escitalopram 20 MG tablet Commonly known as: LEXAPRO TAKE 1 TABLET ONCE DAILY.   esomeprazole 20 MG packet Commonly known as: NEXIUM Take 20 mg by mouth daily.   feeding supplement (KATE FARMS STANDARD 1.4) Liqd liquid 4 per day (1 bolus, 3 feeding pump) 80 ml/hr X 12 hrs  1820 calories/day or 4 cans/day   HYDROcodone-acetaminophen 5-325 MG tablet Commonly known as: NORCO/VICODIN Take 1 tablet by mouth every 6 (six) hours as needed for moderate pain.   LORazepam 0.5 MG tablet Commonly known as: ATIVAN Take 1 tablet (0.5 mg total) by mouth every 6 (six)  hours as needed for anxiety.   midodrine 10 MG tablet Commonly known as: PROAMATINE Take 1 tablet (10 mg total) by mouth 3 (three) times daily with meals.   prochlorperazine 5 MG tablet Commonly known as: COMPAZINE Take 1 tablet (5 mg total) by mouth every 6 (six) hours as needed for nausea or vomiting. May take orally or crush and take via tube.   simvastatin 40 MG tablet Commonly known as: ZOCOR Take 40 mg by mouth daily.   tamsulosin 0.4 MG Caps capsule Commonly known as: FLOMAX Take 0.4 mg by mouth at bedtime.   vitamin B-12 1000 MCG tablet Commonly known as: CYANOCOBALAMIN Take 1,000 mcg by mouth daily.   VITAMIN D3 PO Take 2,000 Units by mouth daily.      Allergies  Allergen Reactions  . Other     Other reaction(s): Other Sleep walk   . Abilify [Aripiprazole] Other (See Comments)    Short  term memory loss  . Lamictal [Lamotrigine]     Increased agitation   Follow-up Information    MD AT SNF Follow up.        Ladell Pier, MD Follow up on 08/15/2019.   Specialty: Oncology Why: F/U AT [facility] information: [place] [address] [phone]        Penni Bombard, MD Follow up.   Specialties: Neurology, Radiology Why: F/U AS SCHEDULED Contact information: [address] [place] [address] [phone]            The results of significant diagnostics from this hospitalization (including imaging, microbiology, ancillary and laboratory) are listed below for reference.    Significant Diagnostic Studies: CT HEAD WO CONTRAST  Result Date: 08/06/2019 CLINICAL DATA:  Recurrent syncope EXAM: CT HEAD WITHOUT CONTRAST TECHNIQUE: Contiguous axial images were obtained from the base of the skull through the vertex without intravenous contrast. COMPARISON:  05/06/2019 FINDINGS: Brain: There is no mass, hemorrhage or extra-axial collection. The appearance of the white matter is normal for the patient's age. There is generalized atrophy. Vascular: No abnormal hyperdensity of the major intracranial arteries or dural venous sinuses. No intracranial atherosclerosis. Skull: The visualized skull base, calvarium and extracranial soft tissues are normal. Sinuses/Orbits: No fluid levels or advanced mucosal thickening of the visualized paranasal sinuses. No mastoid or middle ear effusion. The orbits are normal. IMPRESSION: Generalized atrophy without acute intracranial abnormality. Electronically Signed   By: Ulyses Jarred M.D.   On: 08/06/2019 21:42   MR BRAIN WO CONTRAST  Result Date: 08/09/2019 CLINICAL DATA:  Dizziness, dehydration or hypotension. Additional history provided: Medical history significant for esophageal cancer on palliative chemotherapy, hypertension, dementia, hyperlipidemia, BPH, anxiety disorder presenting with  intermittent dizziness. EXAM: MRI HEAD WITHOUT CONTRAST TECHNIQUE: Multiplanar, multiecho pulse sequences of the brain and surrounding structures were obtained without intravenous contrast. COMPARISON:  Noncontrast head CT 08/06/2019, brain MRI 10/28/2017. FINDINGS: Brain: Please note evaluation for intracranial metastatic disease is limited on this noncontrast study. There are few scattered punctate foci of T2/FLAIR hyperintensity within the cerebral white matter, unchanged and nonspecific. Stable moderate correct atrophy with somewhat disproportionate involvement of the medial temporal lobes. There is no acute infarct. No evidence of intracranial mass. No chronic intracranial blood products. No extra-axial fluid collection. No midline shift. Vascular: Expected proximal arterial flow voids. Skull and upper cervical spine: No focal marrow lesion. Sinuses/Orbits: Visualized orbits show no acute finding. Minimal ethmoid sinus mucosal thickening. No significant mastoid effusion. IMPRESSION: 1. Please note evaluation for  intracranial metastatic disease is limited on this non-contrast exam. 2. No evidence of acute intracranial abnormality. 3. Stable moderate parenchymal atrophy with somewhat disproportionate involvement of the medial temporal lobes. 4. A few scattered punctate T2 hyperintense signal changes within the cerebral white matter are unchanged and nonspecific. 5. Minimal ethmoid sinus mucosal thickening. Electronically Signed   By: Kellie Simmering DO   On: 08/09/2019 15:38   DG ABDOMEN PEG TUBE LOCATION  Result Date: 08/05/2019 CLINICAL DATA:  Peg tube evaluation. EXAM: ABDOMEN - 1 VIEW COMPARISON:  IR gastrostomy placement 07/08/2019 FINDINGS: A single frontal view of the abdomen is provided. A PEG tube balloon is seen in the left upper quadrant projecting over the stomach. There is contrast within the stomach and proximal duodenum. No evidence of extraluminal contrast. The bowel gas pattern is  nonobstructive. IMPRESSION: PEG tube balloon projects over the stomach. Contrast is seen within the stomach and proximal duodenum. No evidence of extraluminal contrast. Electronically Signed   By: Audie Pinto M.D.   On: 08/05/2019 18:06    Microbiology: Recent Results (from the past 240 hour(s))  Respiratory Panel by RT PCR (Flu A&B, Covid) - Nasopharyngeal Swab     Status: None   Collection Time: 08/13/19 12:02 PM   Specimen: Nasopharyngeal Swab  Result Value Ref Range Status   SARS Coronavirus 2 by RT PCR NEGATIVE NEGATIVE Final    Comment: (NOTE) SARS-CoV-2 target nucleic acids are NOT DETECTED. The SARS-CoV-2 RNA is generally detectable in upper respiratoy specimens during the acute phase of infection. The lowest concentration of SARS-CoV-2 viral copies this assay can detect is 131 copies/mL. A negative result does not preclude SARS-Cov-2 infection and should not be used as the sole basis for treatment or other patient management decisions. A negative result may occur with  improper specimen collection/handling, submission of specimen other than nasopharyngeal swab, presence of viral mutation(s) within the areas targeted by this assay, and inadequate number of viral copies (<131 copies/mL). A negative result must be combined with clinical observations, patient history, and epidemiological information. The expected result is Negative. Fact Sheet for Patients:  PinkCheek.be Fact Sheet for Healthcare Providers:  GravelBags.it This test is not yet ap proved or cleared by the [place] FDA and  has been authorized for detection and/or diagnosis of SARS-CoV-2 by FDA under an Emergency Use Authorization (EUA). This EUA will remain  in effect (meaning this test can be used) for the duration of the COVID-19 declaration under Section 564(b)(1) of the Act, 21 U.S.C. section 360bbb-3(b)(1), unless the authorization is  terminated or revoked sooner.    Influenza A by PCR NEGATIVE NEGATIVE Final   Influenza B by PCR NEGATIVE NEGATIVE Final    Comment: (NOTE) The Xpert Xpress SARS-CoV-2/FLU/RSV assay is intended as an aid in  the diagnosis of influenza from Nasopharyngeal swab specimens and  should not be used as a sole basis for treatment. Nasal washings and  aspirates are unacceptable for Xpert Xpress SARS-CoV-2/FLU/RSV  testing. Fact Sheet for Patients: PinkCheek.be Fact Sheet for Healthcare Providers: GravelBags.it This test is not yet approved or cleared by the [place] FDA and  has been authorized for detection and/or diagnosis of SARS-CoV-2 by  FDA under an Emergency Use Authorization (EUA). This EUA will remain  in effect (meaning this test can be used) for the duration of the  Covid-19 declaration under Section 564(b)(1) of the Act, 21  U.S.C. section 360bbb-3(b)(1), unless the authorization is  terminated or revoked. Performed at [facility], [place] [address], [place], [place] [address]      Labs: Basic Metabolic Panel: Recent Labs  Lab 08/09/19 0528 08/10/19 1683 08/11/19 0516 08/12/19 0553 08/13/19 0602  NA 137 134* 135 134* 135  K 4.1 3.9 4.6 4.5 4.4  CL 106 105 104 102 102  CO2 24 24 26 26 22   GLUCOSE 105* 120* 135* 133* 119*  BUN 13 11 14 16 17   CREATININE 0.92 0.79 1.00 0.94 0.84  CALCIUM 9.0 8.8* 9.4 9.5 10.3  MG 2.2  --   --   --   --    Liver Function Tests: Recent Labs  Lab 08/06/19 1948 08/07/19 0537  AST 18 13*  ALT 13 11  ALKPHOS 58 52  BILITOT 1.4* 1.1  PROT 6.7 6.1*  ALBUMIN 3.8 3.4*   No results for input(s): LIPASE, AMYLASE in the last 168 hours. No results for input(s): AMMONIA in the last 168 hours. CBC: Recent Labs  Lab 08/06/19 1948 08/07/19 0537 08/08/19 0649 08/09/19 0528 08/10/19 0642  WBC 8.9 7.1 6.1 5.9 5.3  HGB 12.9* 12.2* 11.7* 11.8* 11.9*  HCT 39.0  38.5* 36.2* 36.5* 36.6*  MCV 89.7 92.5 91.9 91.9 90.8  PLT 190 146* 129* 123* 122*   Cardiac Enzymes: No results for input(s): CKTOTAL, CKMB, CKMBINDEX, TROPONINI in the last 168 hours. BNP: BNP (last 3 results) No results for input(s): BNP in the last 8760 hours.  ProBNP (last 3 results) No results for input(s): PROBNP in the last 8760 hours.  CBG: Recent Labs  Lab 08/12/19 2005 08/13/19 0019 08/13/19 0401 08/13/19 0732 08/13/19 1148  GLUCAP 102* 137* 106* 121* 111*       Signed:  Irine Seal MD.  [facility] 08/13/2019, 3:18 PM

## 2019-08-13 NOTE — Plan of Care (Signed)
  Problem: Education: Goal: Knowledge of General Education information will improve Description: Including pain rating scale, medication(s)/side effects and non-pharmacologic comfort measures Outcome: Adequate for Discharge   Problem: Health Behavior/Discharge Planning: Goal: Ability to manage health-related needs will improve Outcome: Adequate for Discharge   Problem: Clinical Measurements: Goal: Ability to maintain clinical measurements within normal limits will improve Outcome: Adequate for Discharge Goal: Will remain free from infection Outcome: Adequate for Discharge Goal: Diagnostic test results will improve Outcome: Adequate for Discharge Goal: Respiratory complications will improve Outcome: Adequate for Discharge Goal: Cardiovascular complication will be avoided Outcome: Adequate for Discharge   Problem: Activity: Goal: Risk for activity intolerance will decrease Outcome: Adequate for Discharge   Problem: Nutrition: Goal: Adequate nutrition will be maintained Outcome: Adequate for Discharge   Problem: Coping: Goal: Level of anxiety will decrease Outcome: Adequate for Discharge   Problem: Elimination: Goal: Will not experience complications related to bowel motility Outcome: Adequate for Discharge Goal: Will not experience complications related to urinary retention Outcome: Adequate for Discharge   Problem: Pain Managment: Goal: General experience of comfort will improve Outcome: Adequate for Discharge   Problem: Safety: Goal: Ability to remain free from injury will improve Outcome: Adequate for Discharge   Problem: Skin Integrity: Goal: Risk for impaired skin integrity will decrease Outcome: Adequate for Discharge   Problem: Acute Rehab PT Goals(only PT should resolve) Goal: Patient Will Transfer Sit To/From Stand Outcome: Adequate for Discharge Goal: Pt Will Perform Standing Balance Or Pre-Gait Outcome: Adequate for Discharge Goal: Pt Will  Ambulate Outcome: Adequate for Discharge

## 2019-08-13 NOTE — Progress Notes (Signed)
Attempted to call report but the phone rang continuously and was not answered.

## 2019-08-13 NOTE — Progress Notes (Signed)
Occupational Therapy Treatment Patient Details Name: David [name] MRN: [id number] DOB: 08-20-1945 Today's Date: 08/13/2019    History of present illness 74 y.o. male with medical history significant of esophageal cancer on palliative chemotherapy, hypertension, dementia, hyperlipidemia, BPH and anxiety disorder who presented to the ER with intermittent dizziness.  Patient apparently has had issues with dizziness when he gets from laying to standing position. admitted with presyncope likely secondary to orthostasis. CT head negative   OT comments  Upon arrival patient and nurse initially consent to OT however student RN then states patient was orthostatic and requests to wait for OOB activity until morning medications given. OT instruct patient in bed level exercises listed below while waiting for morning medications. Patient expresses need to urinate imminently, supervision for standing at edge of bed and managing urinal. Patient does report feeling dizzy with standing, no loss of balance noted. Patient returned to bed to wait for morning medications. Will continue to follow.   Follow Up Recommendations  Home health OT;Supervision/Assistance - 24 hour    Equipment Recommendations  None recommended by OT       Precautions / Restrictions Precautions Precautions: Fall Precaution Comments: G tube, orthostatic Restrictions Weight Bearing Restrictions: No       Mobility Bed Mobility Overal bed mobility: Needs Assistance Bed Mobility: Supine to Sit;Sit to Supine     Supine to sit: Supervision Sit to supine: Supervision      Transfers Overall transfer level: Needs assistance Equipment used: None Transfers: Sit to/from Stand Sit to Stand: Supervision         General transfer comment: supervision for safety due to dizziness, no physical assist needed    Balance Overall balance assessment: Mild deficits observed, not formally tested                                         ADL either performed or assessed with clinical judgement   ADL Overall ADL's : Needs assistance/impaired                         Toilet Transfer: Supervision/safety(urinal)   Toileting- Clothing Manipulation and Hygiene: Supervision/safety;Sit to/from stand       Functional mobility during ADLs: Supervision/safety General ADL Comments: patient with imminent need to urinate, supervision level for standing to use urinal at edge of bed               Cognition Arousal/Alertness: Awake/alert Behavior During Therapy: WFL for tasks assessed/performed Overall Cognitive Status: History of cognitive impairments - at baseline                                 General Comments: follows commands appropriately, oriented to month, year, place (did not know exact date)        Exercises Exercises: General Lower Extremity General Exercises - Lower Extremity Ankle Circles/Pumps: Both;10 reps;Supine Quad Sets: Both;10 reps;Supine Gluteal Sets: Both;10 reps;Supine Heel Slides: Both;10 reps;Supine Hip ABduction/ADduction: Both;10 reps;Supine           Pertinent Vitals/ Pain       Pain Assessment: No/denies pain         Frequency  Min 2X/week        Progress Toward Goals  OT Goals(current goals can now be found in the care plan section)  Progress  towards OT goals: Progressing toward goals  Acute Rehab OT Goals Patient Stated Goal: To demonstrate daily tasks without fatigue OT Goal Formulation: With patient Time For Goal Achievement: 08/22/19 Potential to Achieve Goals: Fair ADL Goals Pt Will Perform Grooming: standing Pt Will Perform Upper Body Bathing: Independently Pt Will Perform Lower Body Bathing: Independently Pt Will Perform Upper Body Dressing: Independently Pt Will Perform Lower Body Dressing: Independently Pt Will Transfer to Toilet: Independently Pt Will Perform Toileting - Clothing Manipulation and hygiene:  Independently Pt Will Perform Tub/Shower Transfer: with supervision  Plan Discharge plan remains appropriate       AM-PAC OT "6 Clicks" Daily Activity     Outcome Measure   Help from another person eating meals?: A Little Help from another person taking care of personal grooming?: A Little Help from another person toileting, which includes using toliet, bedpan, or urinal?: A Little Help from another person bathing (including washing, rinsing, drying)?: A Little Help from another person to put on and taking off regular upper body clothing?: A Little Help from another person to put on and taking off regular lower body clothing?: A Little 6 Click Score: 18    End of Session  OT Visit Diagnosis: Other symptoms and signs involving cognitive function;History of falling (Z91.81)   Activity Tolerance Treatment limited secondary to medical complications (Comment);Other (comment)(waiting for BP medication )   Patient Left in bed;with call bell/phone within reach;with bed alarm set           Time: [phone] OT Time Calculation (min): 21 min  Charges: OT General Charges $OT Visit: 1 Visit OT Treatments $Therapeutic Exercise: 8-22 mins  Delbert Phenix OT Pager: [place] 08/13/2019, 1:00 PM

## 2019-08-13 NOTE — Care Management Important Message (Signed)
Important Message  Patient Details IM Letter given to Roque Lias SW Case Manager to present to the Patient Name: David [name] MRN: [id number] Date of Birth: [birth year]   Medicare Important Message Given:  Yes     Kerin Salen 08/13/2019, 12:29 PM

## 2019-08-14 ENCOUNTER — Telehealth: Payer: Self-pay | Admitting: *Deleted

## 2019-08-14 ENCOUNTER — Ambulatory Visit: Payer: Medicare Other

## 2019-08-14 ENCOUNTER — Telehealth: Payer: Self-pay | Admitting: General Practice

## 2019-08-14 NOTE — Telephone Encounter (Signed)
[place] CSW Progress Notes  Patient admitted to [facility] [phone]) - for transportation/appts call Curl[facility] Dolphin, for any other issue, call main # and ask for his RN.  Tammi is aware of his appts on 5/6 and will arrange transportation for these.  Edwyna Shell, LCSW Clinical Social Worker Phone:  [phone] Cell:  [phone]

## 2019-08-14 NOTE — Progress Notes (Signed)
Spoke with [employer] Conservation officer, nature) at [facility] and she confirms that they have transportation set up for tomorrow's appointments here at [facility].

## 2019-08-14 NOTE — Telephone Encounter (Signed)
Called nurse at [facility] and confirmed they have him down to come to [facility] tomorrow at 0800.

## 2019-08-15 ENCOUNTER — Ambulatory Visit: Payer: Medicare Other

## 2019-08-15 ENCOUNTER — Inpatient Hospital Stay: Payer: Medicare Other

## 2019-08-15 ENCOUNTER — Other Ambulatory Visit: Payer: Self-pay

## 2019-08-15 ENCOUNTER — Inpatient Hospital Stay (HOSPITAL_BASED_OUTPATIENT_CLINIC_OR_DEPARTMENT_OTHER): Payer: Medicare Other | Admitting: Oncology

## 2019-08-15 ENCOUNTER — Other Ambulatory Visit: Payer: Self-pay | Admitting: *Deleted

## 2019-08-15 ENCOUNTER — Inpatient Hospital Stay: Payer: Medicare Other | Attending: Oncology

## 2019-08-15 VITALS — BP 112/82 | HR 111 | Temp 97.8°F | Resp 19 | Ht 74.0 in | Wt 214.2 lb

## 2019-08-15 DIAGNOSIS — R1310 Dysphagia, unspecified: Secondary | ICD-10-CM | POA: Diagnosis not present

## 2019-08-15 DIAGNOSIS — E860 Dehydration: Secondary | ICD-10-CM | POA: Diagnosis not present

## 2019-08-15 DIAGNOSIS — F419 Anxiety disorder, unspecified: Secondary | ICD-10-CM | POA: Diagnosis not present

## 2019-08-15 DIAGNOSIS — C159 Malignant neoplasm of esophagus, unspecified: Secondary | ICD-10-CM | POA: Diagnosis present

## 2019-08-15 DIAGNOSIS — Z931 Gastrostomy status: Secondary | ICD-10-CM | POA: Diagnosis not present

## 2019-08-15 DIAGNOSIS — G4730 Sleep apnea, unspecified: Secondary | ICD-10-CM | POA: Insufficient documentation

## 2019-08-15 DIAGNOSIS — C155 Malignant neoplasm of lower third of esophagus: Secondary | ICD-10-CM

## 2019-08-15 DIAGNOSIS — F0390 Unspecified dementia without behavioral disturbance: Secondary | ICD-10-CM | POA: Insufficient documentation

## 2019-08-15 DIAGNOSIS — Z923 Personal history of irradiation: Secondary | ICD-10-CM | POA: Diagnosis not present

## 2019-08-15 DIAGNOSIS — E785 Hyperlipidemia, unspecified: Secondary | ICD-10-CM | POA: Diagnosis not present

## 2019-08-15 DIAGNOSIS — N400 Enlarged prostate without lower urinary tract symptoms: Secondary | ICD-10-CM | POA: Diagnosis not present

## 2019-08-15 DIAGNOSIS — Z5112 Encounter for antineoplastic immunotherapy: Secondary | ICD-10-CM | POA: Diagnosis present

## 2019-08-15 DIAGNOSIS — K920 Hematemesis: Secondary | ICD-10-CM | POA: Insufficient documentation

## 2019-08-15 DIAGNOSIS — F329 Major depressive disorder, single episode, unspecified: Secondary | ICD-10-CM | POA: Diagnosis not present

## 2019-08-15 MED ORDER — ACETAMINOPHEN 325 MG PO TABS
ORAL_TABLET | ORAL | Status: AC
  Filled 2019-08-15: qty 2

## 2019-08-15 MED ORDER — SODIUM CHLORIDE 0.9 % IV SOLN
200.0000 mg | Freq: Once | INTRAVENOUS | Status: AC
  Administered 2019-08-15: 200 mg via INTRAVENOUS
  Filled 2019-08-15: qty 8

## 2019-08-15 MED ORDER — ONDANSETRON HCL 4 MG/2ML IJ SOLN
INTRAMUSCULAR | Status: AC
  Filled 2019-08-15: qty 4

## 2019-08-15 MED ORDER — SODIUM CHLORIDE 0.9 % IV SOLN
Freq: Once | INTRAVENOUS | Status: AC
  Filled 2019-08-15: qty 250

## 2019-08-15 MED ORDER — ACETAMINOPHEN 325 MG PO TABS
650.0000 mg | ORAL_TABLET | Freq: Once | ORAL | Status: AC
  Administered 2019-08-15: 650 mg via ORAL

## 2019-08-15 MED ORDER — ONDANSETRON HCL 4 MG/2ML IJ SOLN
8.0000 mg | Freq: Once | INTRAMUSCULAR | Status: AC
  Administered 2019-08-15: 8 mg via INTRAVENOUS

## 2019-08-15 MED ORDER — DEXTROSE-NACL 5-0.9 % IV SOLN: Freq: Once | INTRAVENOUS | Status: DC

## 2019-08-15 MED ORDER — LORAZEPAM 1 MG PO TABS
0.5000 mg | ORAL_TABLET | Freq: Once | ORAL | Status: AC
  Administered 2019-08-15: 0.5 mg via SUBLINGUAL

## 2019-08-15 MED ORDER — LORAZEPAM 1 MG PO TABS
ORAL_TABLET | ORAL | Status: AC
  Filled 2019-08-15: qty 1

## 2019-08-15 MED ORDER — PROCHLORPERAZINE EDISYLATE 10 MG/2ML IJ SOLN
5.0000 mg | Freq: Once | INTRAMUSCULAR | Status: AC
  Administered 2019-08-15: 5 mg via INTRAVENOUS

## 2019-08-15 NOTE — Patient Instructions (Addendum)
[facility] Discharge Instructions for Patients  Today you received the following immunotherapy agents Pembrolizumab Beryle Flock)   To help prevent nausea and vomiting after your treatment, we encourage you to take your nausea medication as prescribed.   If you develop nausea and vomiting that is not controlled by your nausea medication, call the clinic.   BELOW ARE SYMPTOMS THAT SHOULD BE REPORTED IMMEDIATELY:  *FEVER GREATER THAN 100.5 F  *CHILLS WITH OR WITHOUT FEVER  NAUSEA AND VOMITING THAT IS NOT CONTROLLED WITH YOUR NAUSEA MEDICATION  *UNUSUAL SHORTNESS OF BREATH  *UNUSUAL BRUISING OR BLEEDING  TENDERNESS IN MOUTH AND THROAT WITH OR WITHOUT PRESENCE OF ULCERS  *URINARY PROBLEMS  *BOWEL PROBLEMS  UNUSUAL RASH Items with * indicate a potential emergency and should be followed up as soon as possible.  Feel free to call the clinic should you have any questions or concerns. The clinic phone number is [phone].  Please show the [place] at check-in to the Emergency Department and triage nurse.  Pembrolizumab [facility])  injection What is this medicine? PEMBROLIZUMAB (pem broe liz ue mab) is a monoclonal antibody. It is used to treat certain types of cancer. This medicine may be used for other purposes; ask your health care provider or pharmacist if you have questions. COMMON BRAND NAME(S): Keytruda What should I tell my health care provider before I take this medicine? They need to know if you have any of these conditions:  diabetes  immune system problems  inflammatory bowel disease  liver disease  lung or breathing disease  lupus  received or scheduled to receive an organ transplant or a stem-cell transplant that uses donor stem cells  an unusual or allergic reaction to pembrolizumab, other medicines, foods, dyes, or preservatives  pregnant or trying to get pregnant  breast-feeding How should I use this medicine? This medicine  is for infusion into a vein. It is given by a health care professional in a hospital or clinic setting. A special MedGuide will be given to you before each treatment. Be sure to read this information carefully each time. Talk to your pediatrician regarding the use of this medicine in children. While this drug may be prescribed for children as young as 6 months for selected conditions, precautions do apply. Overdosage: If you think you have taken too much of this medicine contact a poison control center or emergency room at once. NOTE: This medicine is only for you. Do not share this medicine with others. What if I miss a dose? It is important not to miss your dose. Call your doctor or health care professional if you are unable to keep an appointment. What may interact with this medicine? Interactions have not been studied. Give your health care provider a list of all the medicines, herbs, non-prescription drugs, or dietary supplements you use. Also tell them if you smoke, drink alcohol, or use illegal drugs. Some items may interact with your medicine. This list may not describe all possible interactions. Give your health care provider a list of all the medicines, herbs, non-prescription drugs, or dietary supplements you use. Also tell them if you smoke, drink alcohol, or use illegal drugs. Some items may interact with your medicine. What should I watch for while using this medicine? Your condition will be monitored carefully while you are receiving this medicine. You may need blood work done while you are taking this medicine. Do not become pregnant while taking this medicine or for 4 months after stopping it.  Women should inform their doctor if they wish to become pregnant or think they might be pregnant. There is a potential for serious side effects to an unborn child. Talk to your health care professional or pharmacist for more information. Do not breast-feed an infant while taking this medicine or  for 4 months after the last dose. What side effects may I notice from receiving this medicine? Side effects that you should report to your doctor or health care professional as soon as possible:  allergic reactions like skin rash, itching or hives, swelling of the face, lips, or tongue  bloody or black, tarry  breathing problems  changes in vision  chest pain  chills  confusion  constipation  cough  diarrhea  dizziness or feeling faint or lightheaded  fast or irregular heartbeat  fever  flushing  joint pain  low blood counts - this medicine may decrease the number of white blood cells, red blood cells and platelets. You may be at increased risk for infections and bleeding.  muscle pain  muscle weakness  pain, tingling, numbness in the hands or feet  persistent headache  redness, blistering, peeling or loosening of the skin, including inside the mouth  signs and symptoms of high blood sugar such as dizziness; dry mouth; dry skin; fruity breath; nausea; stomach pain; increased hunger or thirst; increased urination  signs and symptoms of kidney injury like trouble passing urine or change in the amount of urine  signs and symptoms of liver injury like dark urine, light-colored stools, loss of appetite, nausea, right upper belly pain, yellowing of the eyes or skin  sweating  swollen lymph nodes  weight loss Side effects that usually do not require medical attention (report to your doctor or health care professional if they continue or are bothersome):  decreased appetite  hair loss  muscle pain  tiredness This list may not describe all possible side effects. Call your doctor for medical advice about side effects. You may report side effects to FDA at 1-800-FDA-1088. Where should I keep my medicine? This drug is given in a hospital or clinic and will not be stored at home. NOTE: This sheet is a summary. It may not cover all possible information. If you  have questions about this medicine, talk to your doctor, pharmacist, or health care provider.  2020 Elsevier/Gold Standard (2019-02-01 18:07:58)

## 2019-08-15 NOTE — Progress Notes (Signed)
  [facility] OFFICE PROGRESS NOTE   Diagnosis: Esophagus cancer  INTERVAL HISTORY:   [name] was discharged to a skilled nursing facility on 08/13/2019.  No recurrent syncope events.  He is tolerating liquids.  His wife is present by telephone for today's visit.  She reports he had nausea and dizziness yesterday.  She acknowledges his swallowing has improved.    Objective:  Vital signs in last 24 hours:  There were no vitals taken for this visit.    HEENT: The mucous membranes are moist, no thrush Resp: Lungs clear bilaterally Cardio: Regular rate and rhythm with an occasional pause GI: No hepatosplenomegaly, left upper quadrant gastrostomy tube site without evidence of infection Vascular: No leg edema Neuro: Alert, follows commands    Lab Results:  Lab Results  Component Value Date   WBC 5.3 08/10/2019   HGB 11.9 (L) 08/10/2019   HCT 36.6 (L) 08/10/2019   MCV 90.8 08/10/2019   PLT 122 (L) 08/10/2019   NEUTROABS 7.8 (H) 07/26/2019    CMP  Lab Results  Component Value Date   NA 135 08/13/2019   K 4.4 08/13/2019   CL 102 08/13/2019   CO2 22 08/13/2019   GLUCOSE 119 (H) 08/13/2019   BUN 17 08/13/2019   CREATININE 0.84 08/13/2019   CALCIUM 10.3 08/13/2019   PROT 6.1 (L) 08/07/2019   ALBUMIN 3.4 (L) 08/07/2019   AST 13 (L) 08/07/2019   ALT 11 08/07/2019   ALKPHOS 52 08/07/2019   BILITOT 1.1 08/07/2019   GFRNONAA >60 08/13/2019   GFRAA >60 08/13/2019    Medications: I have reviewed the patient's current medications.   Assessment/Plan: 1. Esophagus cancer ? CT 05/06/2019-circumferential thickening of the distal esophagus, mild splenomegaly, bladder wall thickening, enlarged prostate ? Upper endoscopy 06/11/2019-ulcerated mass at the lower esophagus, 36 cm from the incisors, partially obstructing, biopsy revealed atypical cells-reactive change versus poorly differentiated neoplasm, outside consultation at [facility] on this biopsy and a repeat  biopsy 06/21/2019 confirmed a poorly differentiated malignant neoplasm ? HER-2 negative, PD-L1 combined positive score -20 ? Repeat endoscopy 06/21/2019-partially obstructing esophageal tumor found in the lower third of the esophagus ? PET scan 07/05/2019-large esophageal mass with nodal involvement both above and below the diaphragm including a left periaortic node ? Palliative radiation to the esophagus mass starting 07/22/2019, completed 08/09/2019 ? Cycle 1 pembrolizumab 08/15/2019 2. Dementia 3. Anxiety/depression 4. Sleep apnea 5. BPH 6. Dysphagia secondary to #1  Gastrostomy tube placement 07/08/2019 7. Hyperlipidemia 8. Admission with hematemesis 07/21/2019 9. Admission for dehydration and pre-syncope in clinic 08/06/19     Disposition: [name] has completed palliative radiation to the esophagus.  Dysphagia has improved.  He is now living in a skilled nursing facility.  He continues to receive hydration and feedings via the gastrostomy tube.  [name] vomited in the examination room today.  There was a light brown vomitus, potentially related to morning tube feedings.  We will be sure he has nausea medicine at the skilled nursing facility.  The plan is to begin pembrolizumab treatment today.  I reviewed potential toxicities associated with pembrolizumab including the chance of a rash, diarrhea, and other autoimmune toxicities.  He agrees to proceed.  His wife was present by telephone for today's visit.  [name] will return for an office visit on 08/23/2019.  His wife will contact us in the interim if he has new symptoms.    Betsy Coder, MD  08/15/2019  9:03 AM

## 2019-08-15 NOTE — Progress Notes (Signed)
Per Dr. Benay Spice: does not require labs to treat today.

## 2019-08-15 NOTE — Progress Notes (Signed)
1000: Pt reports dizziness and nausea. VS WN. Zofran given almost one hour ago. Dr. Benay Spice notified. 5 mg compazine ordered and given per MAR. Pt's wife called skilled care facility, pt did not receive his AM medicine. 1045 Pt now reports a headache. Manuela Schwartz RN at bedside to check on pt. She was updated on pt's status.  1100 NS Bolus ordered along with Tylenol and Ativan. Given per [facility] 1130 pt denies headache or nauea, Pt reports dome dizziness 1200 Pt denies headache or nausea.

## 2019-08-16 ENCOUNTER — Telehealth: Payer: Self-pay | Admitting: *Deleted

## 2019-08-16 ENCOUNTER — Ambulatory Visit: Payer: Medicare Other

## 2019-08-16 NOTE — Telephone Encounter (Signed)
Spoke with nurse, Skyler at facility. Patient had some N/V this morning, but it was resolved with one dose of compazine. He is up in chair and doing well.

## 2019-08-19 ENCOUNTER — Ambulatory Visit: Payer: Medicare Other

## 2019-08-20 ENCOUNTER — Ambulatory Visit: Payer: Medicare Other

## 2019-08-20 ENCOUNTER — Other Ambulatory Visit: Payer: Self-pay

## 2019-08-20 ENCOUNTER — Non-Acute Institutional Stay: Payer: Medicare Other | Admitting: Hospice

## 2019-08-20 DIAGNOSIS — Z515 Encounter for palliative care: Secondary | ICD-10-CM

## 2019-08-20 DIAGNOSIS — C155 Malignant neoplasm of lower third of esophagus: Secondary | ICD-10-CM

## 2019-08-20 NOTE — Progress Notes (Addendum)
[facility] Consult Note Telephone: [phone]  Fax: [phone]  PATIENT NAME: David [name] DOB: 1945/11/09 MRN: [id number]  PRIMARY CARE PROVIDER:   Leighton Ruff, MD  REFERRING PROVIDER:Jordan Blattenberger, NP  RESPONSIBLE PARTY:  Self Contact: Spouse Teigan Sahli [phone]    RECOMMENDATIONS/PLAN:   Advance Care Planning/Goals of Care:  Visit at the request of [place] Blattenberger, NP for palliative consult. Visit consisted of building trust and discussions on Palliative Medicine as specialized medical care for people living with serious illness, aimed at facilitating better quality of life through symptoms relief, assisting with advance care plan and establishing goals of care.  Patient endorsed palliative care, affirmed that he is a Full code at this time.  He also shared he is in some emotional distress because of his diagnosis of neoplasm of lower third of esophagus.  Therapeutic listening and ample emotional support provided.  Goals of care include to maximize quality of life and symptom management.  O[facility] Curd number several times without success; number busy, no voicemail set up.  Spoke with [facility] NP, Maudie Mercury,  who reported [name] involvement in patient's care and openness to discussions on goals of care clarification/CODE STATUS. Symptom management: Patient with recent diagnosis of malignant neoplasm of lower third of esophagus.  Patient on palliative radiation therapy. Plan is to follow-up with oncology;  He has Zofran and Compazine for nausea or vomiting.  Patient with major depressive disorder currently on Lexapro bupropion; followed by psych.  Patient has lorazepam 0.5 mg every 6 hours as needed for anxiety.  Patient denied pain when he swallows and said that he is still able to eat by mouth; PEG tube in place for enteral feed supplementation.  He continues esomeprazole magnesium daily for gastroesophageal reflux  disease.  He denied pain or physical discomfort; he has hydrocodone acetaminophen 5/3 2 5  mg every 6 hours as needed for pain.  PT OT is ongoing; he endorsed weakness.  Chart review shows that patient was recently in the ED for intermittent dizziness and near syncope.  He is currently on midodrine.  He denied dizziness during visit.  Aricept and Namenda still on hold as likely contributing to dizziness/low BP, per chart review in epic.  Education provided to patient slow position changes;  on resting during the day when he needs to, and also participating in his physical therapy and Occupational Therapy as this will help to strengthen him.  He verbalized understanding and committed to participating accordingly.  Patient in no acute distress, nursing staff with no signs at this time.  Encouraged ongoing nursing care. Follow up: Palliative care will continue to follow patient for goals of care clarification and symptom management. I spent 1 hour and 48 minutes providing this consultation; time includes chart review and documentation. More than 50% of the time in this consultation was spent on coordinating communication  HISTORY OF PRESENT ILLNESS:  David [name] is a 74 y.o. year old male with multiple medical problems including malignant neoplasm of lower third of esophagus, major depressive disorder, generalized anxiety disorder, mild dementia with behavioral disturbance. Palliative Care was asked to help address goals of care.   CODE STATUS: Full  PPS: 50% HOSPICE ELIGIBILITY/DIAGNOSIS: TBD  PAST MEDICAL HISTORY:  Past Medical History:  Diagnosis Date  . Anxiety   . BPH (benign prostatic hyperplasia)   . Cancer ([place])   . Dementia ([place])   . Depression   . Hyperlipidemia   .  Hypertension   . Kidney stone   . Memory loss   . 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 Sleep apnea    CPAP  . Vitamin D deficiency     SOCIAL HX:  Social History   Tobacco Use  . Smoking status:  Never Smoker  . Smokeless tobacco: Never Used  Substance Use Topics  . Alcohol use: Yes    Alcohol/week: 1.0 - 2.0 standard drinks    Types: 1 - 2 Shots of liquor per week    Comment: 1-2 shots 4-5 x week    ALLERGIES:  Allergies  Allergen Reactions  . Other     Other reaction(s): Other Sleep walk   . Abilify [Aripiprazole] Other (See Comments)    Short term memory loss  . Lamictal [Lamotrigine]     Increased agitation     PERTINENT MEDICATIONS:  Outpatient Encounter Medications as of 08/20/2019  Medication Sig  . buPROPion (WELLBUTRIN XL) 150 MG 24 hr tablet TAKE 1 TABLET ONCE DAILY. (Patient taking differently: Take 150 mg by mouth daily. )  . Cholecalciferol (VITAMIN D3 PO) Take 2,000 Units by mouth daily.  Marland Kitchen escitalopram (LEXAPRO) 20 MG tablet TAKE 1 TABLET ONCE DAILY. (Patient taking differently: Take 20 mg by mouth daily. )  . esomeprazole (NEXIUM) 20 MG packet Take 20 mg by mouth daily.  Marland Kitchen HYDROcodone-acetaminophen (NORCO/VICODIN) 5-325 MG tablet Take 1 tablet by mouth every 6 (six) hours as needed for moderate pain.  Marland Kitchen LORazepam (ATIVAN) 0.5 MG tablet Take 1 tablet (0.5 mg total) by mouth every 6 (six) hours as needed for anxiety.  . midodrine (PROAMATINE) 10 MG tablet Take 1 tablet (10 mg total) by mouth 3 (three) times daily with meals.  . Nutritional Supplements (FEEDING SUPPLEMENT, KATE FARMS STANDARD 1.4,) LIQD liquid 4 per day (1 bolus, 3 feeding pump) 80 ml/hr X 12 hrs  1820 calories/day or 4 cans/day  . prochlorperazine (COMPAZINE) 5 MG tablet Take 1 tablet (5 mg total) by mouth every 6 (six) hours as needed for nausea or vomiting. May take orally or crush and take via tube. (Patient not taking: Reported on 08/15/2019)  . simvastatin (ZOCOR) 40 MG tablet Take 40 mg by mouth daily.  . Tamsulosin HCl (FLOMAX) 0.4 MG CAPS Take 0.4 mg by mouth at bedtime.   . vitamin B-12 (CYANOCOBALAMIN) 1000 MCG tablet Take 1,000 mcg by mouth daily.   No facility-administered  encounter medications on file as of 08/20/2019.    PHYSICAL EXAM/ROS:/ROS  General: NAD, cooperative, able to engage in meaningful conversation Cardiovascular: regular rate and rhythm; denies chest pain/discomfort Pulmonary: clear ant/post fields; normal respiratory effort Abdomen: soft, nontender, + bowel sounds GU: PEG tube in place Extremities: no edema, no joint deformities Skin: no rashes to exposed skin Neurological: Weakness but otherwise nonfocal  Teodoro Spray, NP

## 2019-08-21 ENCOUNTER — Ambulatory Visit: Payer: Medicare Other

## 2019-08-22 ENCOUNTER — Ambulatory Visit: Payer: Medicare Other

## 2019-08-22 ENCOUNTER — Encounter: Payer: Self-pay | Admitting: Nurse Practitioner

## 2019-08-22 ENCOUNTER — Inpatient Hospital Stay (HOSPITAL_BASED_OUTPATIENT_CLINIC_OR_DEPARTMENT_OTHER): Payer: Medicare Other | Admitting: Nurse Practitioner

## 2019-08-22 ENCOUNTER — Inpatient Hospital Stay: Payer: Medicare Other

## 2019-08-22 ENCOUNTER — Other Ambulatory Visit: Payer: Self-pay

## 2019-08-22 VITALS — BP 143/74 | HR 87 | Temp 98.5°F | Resp 18 | Ht 74.0 in | Wt 215.3 lb

## 2019-08-22 DIAGNOSIS — C155 Malignant neoplasm of lower third of esophagus: Secondary | ICD-10-CM | POA: Diagnosis not present

## 2019-08-22 DIAGNOSIS — Z5112 Encounter for antineoplastic immunotherapy: Secondary | ICD-10-CM | POA: Diagnosis not present

## 2019-08-22 LAB — CBC WITH DIFFERENTIAL (CANCER CENTER ONLY)
Abs Immature Granulocytes: 0.08 10*3/uL — ABNORMAL HIGH (ref 0.00–0.07)
Basophils Absolute: 0 10*3/uL (ref 0.0–0.1)
Basophils Relative: 0 %
Eosinophils Absolute: 0.2 10*3/uL (ref 0.0–0.5)
Eosinophils Relative: 2 %
HCT: 42.3 % (ref 39.0–52.0)
Hemoglobin: 14.2 g/dL (ref 13.0–17.0)
Immature Granulocytes: 1 %
Lymphocytes Relative: 8 %
Lymphs Abs: 0.8 10*3/uL (ref 0.7–4.0)
MCH: 29.3 pg (ref 26.0–34.0)
MCHC: 33.6 g/dL (ref 30.0–36.0)
MCV: 87.4 fL (ref 80.0–100.0)
Monocytes Absolute: 0.7 10*3/uL (ref 0.1–1.0)
Monocytes Relative: 7 %
Neutro Abs: 8 10*3/uL — ABNORMAL HIGH (ref 1.7–7.7)
Neutrophils Relative %: 82 %
Platelet Count: 132 10*3/uL — ABNORMAL LOW (ref 150–400)
RBC: 4.84 MIL/uL (ref 4.22–5.81)
RDW: 13.4 % (ref 11.5–15.5)
WBC Count: 9.7 10*3/uL (ref 4.0–10.5)
nRBC: 0 % (ref 0.0–0.2)

## 2019-08-22 LAB — TSH: TSH: 1.561 u[IU]/mL (ref 0.320–4.118)

## 2019-08-22 LAB — CMP (CANCER CENTER ONLY)
ALT: 17 U/L (ref 0–44)
AST: 20 U/L (ref 15–41)
Albumin: 3.5 g/dL (ref 3.5–5.0)
Alkaline Phosphatase: 66 U/L (ref 38–126)
Anion gap: 14 (ref 5–15)
BUN: 17 mg/dL (ref 8–23)
CO2: 20 mmol/L — ABNORMAL LOW (ref 22–32)
Calcium: 11 mg/dL — ABNORMAL HIGH (ref 8.9–10.3)
Chloride: 97 mmol/L — ABNORMAL LOW (ref 98–111)
Creatinine: 1.25 mg/dL — ABNORMAL HIGH (ref 0.61–1.24)
GFR, Est AFR Am: >60 mL/min (ref >60)
GFR, Estimated: 57 mL/min — ABNORMAL LOW (ref >60)
Glucose, Bld: 179 mg/dL — ABNORMAL HIGH (ref 70–99)
Potassium: 4.3 mmol/L (ref 3.5–5.1)
Sodium: 131 mmol/L — ABNORMAL LOW (ref 135–145)
Total Bilirubin: 1 mg/dL (ref 0.3–1.2)
Total Protein: 6.1 g/dL — ABNORMAL LOW (ref 6.5–8.1)

## 2019-08-22 MED ORDER — SODIUM CHLORIDE 0.9 % IV SOLN
INTRAVENOUS | Status: AC
  Filled 2019-08-22 (×2): qty 250

## 2019-08-22 MED ORDER — ONDANSETRON HCL 8 MG PO TABS: 8.0000 mg | ORAL_TABLET | Freq: Three times a day (TID) | ORAL | 0 refills | Status: AC | PRN

## 2019-08-22 NOTE — Progress Notes (Addendum)
[facility] OFFICE PROGRESS NOTE   Diagnosis: Esophagus cancer  INTERVAL HISTORY:   David [name] returns as scheduled.  He completed cycle 1 Pembrolizumab 08/15/2019.  He continues to have intermittent nausea/vomiting.  To feedings continue.  He is taking some food by mouth.  Early satiety.  No diarrhea.  No rash.  Objective:  Vital signs in last 24 hours:  Blood pressure 113/80, pulse 98, temperature 98.5 F (36.9 C), temperature source Temporal, resp. rate 18, height _0  (1.88 m), weight 215 lb 4.8 oz (97.7 kg), SpO2 100 %.    HEENT: No thrush or ulcers.  Mucous membranes appear moist. Resp: Lungs clear bilaterally. Cardio: Regular rate and rhythm. GI: Abdomen soft and nontender.  No hepatomegaly.  Left upper abdomen feeding tube in place, mild erythema at sutures. Vascular: No leg edema. Neuro: Alert.  Follows commands. Skin: No rash.   Lab Results:  Lab Results  Component Value Date   WBC 9.7 08/22/2019   HGB 14.2 08/22/2019   HCT 42.3 08/22/2019   MCV 87.4 08/22/2019   PLT 132 (L) 08/22/2019   NEUTROABS 8.0 (H) 08/22/2019    Imaging:  No results found.  Medications: I have reviewed the patient's current medications.  Assessment/Plan: 1. Esophagus cancer ? CT 05/06/2019-circumferential thickening of the distal esophagus, mild splenomegaly, bladder wall thickening, enlarged prostate ? Upper endoscopy 06/11/2019-ulcerated mass at the lower esophagus, 36 cm from the incisors, partially obstructing, biopsy revealed atypical cells-reactive change versus poorly differentiated neoplasm, outside consultation at [facility] on this biopsy and a repeat biopsy 06/21/2019 confirmed a poorly differentiated malignant neoplasm ? HER-2 negative, PD-L1 combined positive score -20 ? Repeat endoscopy 06/21/2019-partially obstructing esophageal tumor found in the lower third of the esophagus ? PET scan 07/05/2019-large esophageal mass with nodal involvement both above and  below the diaphragm including a left periaortic node ? Palliative radiation to the esophagus mass starting 07/22/2019, completed 08/09/2019 ? Cycle 1 pembrolizumab 08/15/2019 2. Dementia 3. Anxiety/depression 4. Sleep apnea 5. BPH 6. Dysphagia secondary to #1  Gastrostomy tube placement 07/08/2019 7. Hyperlipidemia 8. Admission with hematemesis 07/21/2019 9. Admission for dehydration and pre-syncope in clinic 08/06/19  Disposition: David [name] appears unchanged.  He completed cycle 1 pembrolizumab 08/15/2019.  He continues to have intermittent nausea/vomiting.  This predated the pembrolizumab.  Etiology is unclear, possibly related to the tube feedings.  We recommend to hold the tube feedings for 2 days.  After 2 days he will need to be reevaluated regarding the nausea/vomiting as well as his nutritional status and determine whether or not to resume tube feedings at a slower rate. We will communicate this to the facility nurse practitioner.  He will return for lab, follow-up, pembrolizumab in 2 weeks.  Patient seen with Dr. Benay Spice.  Spoke with NP Joelene Millin at facility regarding the above at [place] ANP/GNP-BC   08/22/2019  3:01 PM  This was a shared visit with Ned Card.  David [name] tolerated the pembrolizumab well.  He continues to have intermittent nausea and vomiting.  Dysphagia is significantly improved.  The etiology of the nausea is unclear.  This may be related to tube feedings, radiation, or the esophageal tumor.  We discussed the case with the nurse practitioner from the nursing facility.  The plan is to hold tube feedings for the next few days and reassess the nausea.  David [name] received intravenous fluids at the cancer center today.  He will be scheduled for an office visit next  week.  Julieanne Manson, MD

## 2019-08-22 NOTE — Patient Instructions (Signed)
Dehydration, Adult Dehydration is a condition in which there is not enough water or other fluids in the body. This happens when a person loses more fluids than he or she takes in. Important organs, such as the kidneys, brain, and heart, cannot function without a proper amount of fluids. Any loss of fluids from the body can lead to dehydration. Dehydration can be mild, moderate, or severe. It should be treated right away to prevent it from becoming severe. What are the causes? Dehydration may be caused by:  Conditions that cause loss of water or other fluids, such as diarrhea, vomiting, or sweating or urinating a lot.  Not drinking enough fluids, especially when you are ill or doing activities that require a lot of energy.  Other illnesses and conditions, such as fever or infection.  Certain medicines, such as medicines that remove excess fluid from the body (diuretics).  Lack of safe drinking water.  Not being able to get enough water and food. What increases the risk? The following factors may make you more likely to develop this condition:  Having a long-term (chronic) illness that has not been treated properly, such as diabetes, heart disease, or kidney disease.  Being 65 years of age or older.  Having a disability.  Living in a place that is high in altitude, where thinner, drier air causes more fluid loss.  Doing exercises that put stress on your body for a long time (endurance sports). What are the signs or symptoms? Symptoms of dehydration depend on how severe it is. Mild or moderate dehydration  Thirst.  Dry lips or dry mouth.  Dizziness or light-headedness, especially when standing up from a seated position.  Muscle cramps.  Dark urine. Urine may be the color of tea.  Less urine or tears produced than usual.  Headache. Severe dehydration  Changes in skin. Your skin may be cold and clammy, blotchy, or pale. Your skin also may not return to normal after being  lightly pinched and released.  Little or no tears, urine, or sweat.  Changes in vital signs, such as rapid breathing and low blood pressure. Your pulse may be weak or may be faster than 100 beats a minute when you are sitting still.  Other changes, such as: ? Feeling very thirsty. ? Sunken eyes. ? Cold hands and feet. ? Confusion. ? Being very tired (lethargic) or having trouble waking from sleep. ? Short-term weight loss. ? Loss of consciousness. How is this diagnosed? This condition is diagnosed based on your symptoms and a physical exam. You may have blood and urine tests to help confirm the diagnosis. How is this treated? Treatment for this condition depends on how severe it is. Treatment should be started right away. Do not wait until dehydration becomes severe. Severe dehydration is an emergency and needs to be treated in a hospital.  Mild or moderate dehydration can be treated at home. You may be asked to: ? Drink more fluids. ? Drink an oral rehydration solution (ORS). This drink helps restore proper amounts of fluids and salts and minerals in the blood (electrolytes).  Severe dehydration can be treated: ? With IV fluids. ? By correcting abnormal levels of electrolytes. This is often done by giving electrolytes through a tube that is passed through your nose and into your stomach (nasogastric tube, or NG tube). ? By treating the underlying cause of dehydration. Follow these instructions at home: Oral rehydration solution If told by your health care provider, drink an ORS:  Make   an ORS by following instructions on the package.  Start by drinking small amounts, about  cup (120 mL) every 5-10 minutes.  Slowly increase how much you drink until you have taken the amount recommended by your health care provider. Eating and drinking         Drink enough clear fluid to keep your urine pale yellow. If you were told to drink an ORS, finish the ORS first and then start slowly  drinking other clear fluids. Drink fluids such as: ? Water. Do not drink only water. Doing that can lead to hyponatremia, which is having too little salt (sodium) in the body. ? Water from ice chips you suck on. ? Fruit juice that you have added water to (diluted fruit juice). ? Low-calorie sports drinks.  Eat foods that contain a healthy balance of electrolytes, such as bananas, oranges, potatoes, tomatoes, and spinach.  Do not drink alcohol.  Avoid the following: ? Drinks that contain a lot of sugar. These include high-calorie sports drinks, fruit juice that is not diluted, and soda. ? Caffeine. ? Foods that are greasy or contain a lot of fat or sugar. General instructions  Take over-the-counter and prescription medicines only as told by your health care provider.  Do not take sodium tablets. Doing that can lead to having too much sodium in the body (hypernatremia).  Return to your normal activities as told by your health care provider. Ask your health care provider what activities are safe for you.  Keep all follow-up visits as told by your health care provider. This is important. Contact a health care provider if:  You have muscle cramps, pain, or discomfort, such as: ? Pain in your abdomen and the pain gets worse or stays in one area (localizes). ? Stiff neck.  You have a rash.  You are more irritable than usual.  You are sleepier or have a harder time waking than usual.  You feel weak or dizzy.  You feel very thirsty. Get help right away if you have:  Any symptoms of severe dehydration.  Symptoms of vomiting, such as: ? You cannot eat or drink without vomiting. ? Vomiting gets worse or does not go away. ? Vomit includes blood or green matter (bile).  Symptoms that get worse with treatment.  A fever.  A severe headache.  Problems with urination or bowel movements, such as: ? Diarrhea that gets worse or does not go away. ? Blood in your stool (feces). This  may cause stool to look black and tarry. ? Not urinating, or urinating only a small amount of very dark urine, within 6-8 hours.  Trouble breathing. These symptoms may represent a serious problem that is an emergency. Do not wait to see if the symptoms will go away. Get medical help right away. Call your local emergency services (911 in the [place] Do not drive yourself to the hospital. Summary  Dehydration is a condition in which there is not enough water or other fluids in the body. This happens when a person loses more fluids than he or she takes in.  Treatment for this condition depends on how severe it is. Treatment should be started right away. Do not wait until dehydration becomes severe.  Drink enough clear fluid to keep your urine pale yellow. If you were told to drink an oral rehydration solution (ORS), finish the ORS first and then start slowly drinking other clear fluids.  Take over-the-counter and prescription medicines only as told by your health care   provider.  Get help right away if you have any symptoms of severe dehydration. This information is not intended to replace advice given to you by your health care provider. Make sure you discuss any questions you have with your health care provider. Document Revised: 11/08/2018 Document Reviewed: 11/08/2018 Elsevier Patient Education  2020 [employer]   

## 2019-08-22 NOTE — Progress Notes (Signed)
1645: Received patient to [facility] 7 in wheelchair with peripheral IV intact to right arm. NS infusing at 500 cc/hr per pump.Patient alert and conversing. States less nauseous than before. Taking po fluids without complaint.  1735: IV infusion complete. IV in right hand d/c'd. Patient transported to spouse's vehicle via wheelchair - spouse states taking patient home for night.

## 2019-08-23 ENCOUNTER — Other Ambulatory Visit: Payer: Self-pay

## 2019-08-23 ENCOUNTER — Telehealth: Payer: Self-pay

## 2019-08-23 ENCOUNTER — Ambulatory Visit: Payer: Medicare Other

## 2019-08-23 ENCOUNTER — Other Ambulatory Visit: Payer: Self-pay | Admitting: Nurse Practitioner

## 2019-08-23 ENCOUNTER — Telehealth: Payer: Self-pay | Admitting: Oncology

## 2019-08-23 DIAGNOSIS — C155 Malignant neoplasm of lower third of esophagus: Secondary | ICD-10-CM

## 2019-08-23 MED ORDER — MIDODRINE HCL 10 MG PO TABS: 10.0000 mg | ORAL_TABLET | Freq: Three times a day (TID) | ORAL | 1 refills | Status: DC

## 2019-08-23 NOTE — Telephone Encounter (Signed)
Spoke with pt's wife regarding the medication refill she requested and to let her know that [facility] is working on the referral to advanced home care as requested. Pt's wife appreciative of call back and verbalized understanding.

## 2019-08-23 NOTE — Telephone Encounter (Signed)
Spoke with pt's wife regarding pt's referral to home health. Pt's wife now states that she wants to use [facility] instead of advanced home care. This RN informed pt's wife that MD Sherrill's office will send in the referral as requested

## 2019-08-23 NOTE — Telephone Encounter (Signed)
Scheduled apt per 5/14 sch message - wife aware of appt date and time

## 2019-08-23 NOTE — Telephone Encounter (Signed)
Home Health referral faxed to [facility]

## 2019-08-25 ENCOUNTER — Emergency Department (HOSPITAL_COMMUNITY): Payer: Medicare Other

## 2019-08-25 ENCOUNTER — Inpatient Hospital Stay (HOSPITAL_COMMUNITY)
Admission: EM | Admit: 2019-08-25 | Discharge: 2019-09-10 | DRG: 193 | Disposition: A | Discharge status: Skilled Nursing Facility | Payer: Medicare Other | Attending: Family Medicine | Admitting: Family Medicine

## 2019-08-25 ENCOUNTER — Other Ambulatory Visit: Payer: Self-pay

## 2019-08-25 ENCOUNTER — Encounter (HOSPITAL_COMMUNITY): Payer: Self-pay

## 2019-08-25 DIAGNOSIS — D696 Thrombocytopenia, unspecified: Secondary | ICD-10-CM | POA: Diagnosis present

## 2019-08-25 DIAGNOSIS — E876 Hypokalemia: Secondary | ICD-10-CM | POA: Diagnosis present

## 2019-08-25 DIAGNOSIS — F0390 Unspecified dementia without behavioral disturbance: Secondary | ICD-10-CM | POA: Diagnosis present

## 2019-08-25 DIAGNOSIS — R404 Transient alteration of awareness: Secondary | ICD-10-CM | POA: Diagnosis not present

## 2019-08-25 DIAGNOSIS — R112 Nausea with vomiting, unspecified: Secondary | ICD-10-CM

## 2019-08-25 DIAGNOSIS — Z79899 Other long term (current) drug therapy: Secondary | ICD-10-CM

## 2019-08-25 DIAGNOSIS — R3915 Urgency of urination: Secondary | ICD-10-CM | POA: Diagnosis present

## 2019-08-25 DIAGNOSIS — Z9221 Personal history of antineoplastic chemotherapy: Secondary | ICD-10-CM

## 2019-08-25 DIAGNOSIS — R55 Syncope and collapse: Secondary | ICD-10-CM

## 2019-08-25 DIAGNOSIS — J189 Pneumonia, unspecified organism: Secondary | ICD-10-CM | POA: Diagnosis not present

## 2019-08-25 DIAGNOSIS — E871 Hypo-osmolality and hyponatremia: Secondary | ICD-10-CM | POA: Diagnosis present

## 2019-08-25 DIAGNOSIS — Z931 Gastrostomy status: Secondary | ICD-10-CM

## 2019-08-25 DIAGNOSIS — F6081 Narcissistic personality disorder: Secondary | ICD-10-CM | POA: Diagnosis present

## 2019-08-25 DIAGNOSIS — G9340 Encephalopathy, unspecified: Secondary | ICD-10-CM | POA: Diagnosis present

## 2019-08-25 DIAGNOSIS — I951 Orthostatic hypotension: Secondary | ICD-10-CM

## 2019-08-25 DIAGNOSIS — Z20822 Contact with and (suspected) exposure to covid-19: Secondary | ICD-10-CM | POA: Diagnosis present

## 2019-08-25 DIAGNOSIS — I10 Essential (primary) hypertension: Secondary | ICD-10-CM | POA: Diagnosis present

## 2019-08-25 DIAGNOSIS — F329 Major depressive disorder, single episode, unspecified: Secondary | ICD-10-CM | POA: Diagnosis present

## 2019-08-25 DIAGNOSIS — D649 Anemia, unspecified: Secondary | ICD-10-CM | POA: Diagnosis present

## 2019-08-25 DIAGNOSIS — F419 Anxiety disorder, unspecified: Secondary | ICD-10-CM | POA: Diagnosis present

## 2019-08-25 DIAGNOSIS — N202 Calculus of kidney with calculus of ureter: Secondary | ICD-10-CM | POA: Diagnosis present

## 2019-08-25 DIAGNOSIS — F411 Generalized anxiety disorder: Secondary | ICD-10-CM

## 2019-08-25 DIAGNOSIS — E785 Hyperlipidemia, unspecified: Secondary | ICD-10-CM | POA: Diagnosis present

## 2019-08-25 DIAGNOSIS — K2210 Ulcer of esophagus without bleeding: Secondary | ICD-10-CM | POA: Diagnosis present

## 2019-08-25 DIAGNOSIS — C155 Malignant neoplasm of lower third of esophagus: Secondary | ICD-10-CM | POA: Diagnosis present

## 2019-08-25 DIAGNOSIS — G9341 Metabolic encephalopathy: Secondary | ICD-10-CM | POA: Diagnosis present

## 2019-08-25 DIAGNOSIS — Z923 Personal history of irradiation: Secondary | ICD-10-CM

## 2019-08-25 DIAGNOSIS — R0602 Shortness of breath: Secondary | ICD-10-CM

## 2019-08-25 DIAGNOSIS — N390 Urinary tract infection, site not specified: Secondary | ICD-10-CM | POA: Diagnosis present

## 2019-08-25 DIAGNOSIS — N401 Enlarged prostate with lower urinary tract symptoms: Secondary | ICD-10-CM | POA: Diagnosis present

## 2019-08-25 DIAGNOSIS — Z66 Do not resuscitate: Secondary | ICD-10-CM | POA: Diagnosis present

## 2019-08-25 DIAGNOSIS — R1310 Dysphagia, unspecified: Secondary | ICD-10-CM | POA: Diagnosis present

## 2019-08-25 LAB — CBC WITH DIFFERENTIAL/PLATELET
Abs Immature Granulocytes: 0.12 10*3/uL — ABNORMAL HIGH (ref 0.00–0.07)
Basophils Absolute: 0 10*3/uL (ref 0.0–0.1)
Basophils Relative: 0 %
Eosinophils Absolute: 0.2 10*3/uL (ref 0.0–0.5)
Eosinophils Relative: 2 %
HCT: 37.5 % — ABNORMAL LOW (ref 39.0–52.0)
Hemoglobin: 12.9 g/dL — ABNORMAL LOW (ref 13.0–17.0)
Immature Granulocytes: 1 %
Lymphocytes Relative: 10 %
Lymphs Abs: 1 10*3/uL (ref 0.7–4.0)
MCH: 28.9 pg (ref 26.0–34.0)
MCHC: 34.4 g/dL (ref 30.0–36.0)
MCV: 83.9 fL (ref 80.0–100.0)
Monocytes Absolute: 0.7 10*3/uL (ref 0.1–1.0)
Monocytes Relative: 7 %
Neutro Abs: 8.6 10*3/uL — ABNORMAL HIGH (ref 1.7–7.7)
Neutrophils Relative %: 80 %
Platelets: 173 10*3/uL (ref 150–400)
RBC: 4.47 MIL/uL (ref 4.22–5.81)
RDW: 13.2 % (ref 11.5–15.5)
WBC: 10.6 10*3/uL — ABNORMAL HIGH (ref 4.0–10.5)
nRBC: 0 % (ref 0.0–0.2)

## 2019-08-25 LAB — COMPREHENSIVE METABOLIC PANEL
ALT: 22 U/L (ref 0–44)
AST: 25 U/L (ref 15–41)
Albumin: 3.3 g/dL — ABNORMAL LOW (ref 3.5–5.0)
Alkaline Phosphatase: 53 U/L (ref 38–126)
Anion gap: 9 (ref 5–15)
BUN: 21 mg/dL (ref 8–23)
CO2: 23 mmol/L (ref 22–32)
Calcium: 10.8 mg/dL — ABNORMAL HIGH (ref 8.9–10.3)
Chloride: 99 mmol/L (ref 98–111)
Creatinine, Ser: 1.15 mg/dL (ref 0.61–1.24)
GFR calc Af Amer: >60 mL/min (ref >60)
GFR calc non Af Amer: >60 mL/min (ref >60)
Glucose, Bld: 110 mg/dL — ABNORMAL HIGH (ref 70–99)
Potassium: 4 mmol/L (ref 3.5–5.1)
Sodium: 131 mmol/L — ABNORMAL LOW (ref 135–145)
Total Bilirubin: 1.5 mg/dL — ABNORMAL HIGH (ref 0.3–1.2)
Total Protein: 6.2 g/dL — ABNORMAL LOW (ref 6.5–8.1)

## 2019-08-25 MED ORDER — SODIUM CHLORIDE 0.9 % IV BOLUS
1000.0000 mL | Freq: Once | INTRAVENOUS | Status: AC
  Administered 2019-08-25: 1000 mL via INTRAVENOUS

## 2019-08-25 MED ORDER — PROCHLORPERAZINE EDISYLATE 10 MG/2ML IJ SOLN
5.0000 mg | Freq: Once | INTRAMUSCULAR | Status: AC
  Administered 2019-08-25: 5 mg via INTRAVENOUS
  Filled 2019-08-25: qty 2

## 2019-08-25 MED ORDER — DIPHENHYDRAMINE HCL 50 MG/ML IJ SOLN
25.0000 mg | Freq: Once | INTRAMUSCULAR | Status: AC
  Administered 2019-08-25: 25 mg via INTRAVENOUS
  Filled 2019-08-25: qty 1

## 2019-08-25 NOTE — ED Triage Notes (Signed)
Per EMS, Pt is coming from home. Pts wife called out due to pt having nausea, and dizziness. Pt received zofran at 0800, and has a hx of nausea/dizziness. Pt A&O 4x, wife on her way.

## 2019-08-25 NOTE — ED Notes (Signed)
Pt was able to ambulate with a steady gait across the room.

## 2019-08-25 NOTE — ED Provider Notes (Signed)
[facility] DEPT Provider Note   CSN: [id number] Arrival date & time: 08/25/19  2034     History Chief Complaint  Patient presents with  . Nausea  . Dizziness    David [name] is a 74 y.o. male.  74 yo M with a cc of passing out.  Thinks it occurred while watching tv or reading a magazine.  Patient has been feeling dizzy for about 24 hours.  Has had this off and on for some time.  Denies recent medication change, denies cough, chest pain, fever, abdominal pain, headache. Dizziness worse with standing.   The history is provided by the patient.  Dizziness Quality:  Imbalance Severity:  Moderate Onset quality:  Gradual Duration:  6 months Timing:  Intermittent Progression:  Waxing and waning Chronicity:  Chronic Context: standing up   Relieved by:  Nothing Worsened by:  Nothing Ineffective treatments:  None tried Associated symptoms: no chest pain, no diarrhea, no headaches, no palpitations, no shortness of breath and no vomiting        Past Medical History:  Diagnosis Date  . Anxiety   . BPH (benign prostatic hyperplasia)   . Cancer ([place])   . Dementia ([place])   . Depression   . Hyperlipidemia   . Hypertension   . Kidney stone   . Memory loss   . 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 Sleep apnea    CPAP  . Vitamin D deficiency     Patient Active Problem List   Diagnosis Date Noted  . Orthostasis 08/07/2019  . Atypical syncope 08/06/2019  . Syncope and collapse 08/06/2019  . Goals of care, counseling/discussion 07/30/2019  . Hematemesis 07/21/2019  . Dysphagia 07/05/2019  . Dementia without behavioral disturbance ([place]) 07/05/2019  . AKI (acute kidney injury) ([place]) 07/05/2019  . Esophageal cancer ([place]) 07/05/2019  . 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 Major depressive disorder, recurrent episode, severe ([place]) 10/28/2017  . Generalized anxiety disorder 10/28/2017  . Dyslipidemia  07/10/2012  . Metabolic syndrome 24/12/7351  . Prostatic hypertrophy 07/10/2012  . Testosterone deficiency 07/10/2012  . Essential hypertension 06/27/2011  . MDD (major depressive disorder), recurrent episode, mild ([place]) 05/16/2011    Past Surgical History:  Procedure Laterality Date  . CATARACT EXTRACTION, BILATERAL  2018  . EXTERNAL EAR SURGERY     child  . EYE SURGERY Bilateral    cataract  . IR GASTROSTOMY TUBE MOD SED  07/08/2019  . right knee meniscus  06/12/2008  . right talus repair     dislocated       Family History  Problem Relation Age of Onset  . Alzheimer's disease Mother   . Depression Mother   . Cancer Father        prostate  . Thyroid disease Father   . Alzheimer's disease Maternal Grandmother   . Heart disease Maternal Grandfather   . Stroke Paternal Grandmother   . Stroke Paternal Grandfather   . Alcoholism Brother   . Autism Daughter   . Autism Maternal Uncle     Social History   Tobacco Use  . Smoking status: Never Smoker  . Smokeless tobacco: Never Used  Substance Use Topics  . Alcohol use: Yes    Alcohol/week: 1.0 - 2.0 standard drinks    Types: 1 - 2 Shots of liquor per week    Comment: 1-2 shots 4-5 x week  . Drug use: Yes    Types: Marijuana  Comment: Occasional    Home Medications Prior to Admission medications   Medication Sig Start Date End Date Taking? Authorizing Provider  buPROPion (WELLBUTRIN XL) 150 MG 24 hr tablet TAKE 1 TABLET ONCE DAILY. Patient taking differently: Take 150 mg by mouth daily.  07/18/19  Yes [name], PMHNP  Cholecalciferol (VITAMIN D3 PO) Take 2,000 Units by mouth daily.   Yes [provider]  escitalopram (LEXAPRO) 20 MG tablet TAKE 1 TABLET ONCE DAILY. Patient taking differently: Take 20 mg by mouth daily.  07/18/19  Yes [name], PMHNP  esomeprazole (NEXIUM) 20 MG packet Take 20 mg by mouth daily. 07/25/19  Yes [provider]  HYDROcodone-acetaminophen (NORCO/VICODIN)  5-325 MG tablet Take 1 tablet by mouth every 6 (six) hours as needed for moderate pain. 08/13/19  Yes Eugenie Filler, MD  LORazepam (ATIVAN) 0.5 MG tablet Take 1 tablet (0.5 mg total) by mouth every 6 (six) hours as needed for anxiety. 08/13/19  Yes Eugenie Filler, MD  midodrine (PROAMATINE) 10 MG tablet Take 1 tablet (10 mg total) by mouth 3 (three) times daily with meals. 08/23/19  Yes Ladell Pier, MD  Nutritional Supplements (FEEDING SUPPLEMENT, KATE FARMS STANDARD 1.4,) LIQD liquid 4 per day (1 bolus, 3 feeding pump) 80 ml/hr X 12 hrs  1820 calories/day or 4 cans/day 07/26/19  Yes Ladell Pier, MD  ondansetron (ZOFRAN) 8 MG tablet Take 1 tablet (8 mg total) by mouth every 8 (eight) hours as needed for nausea or vomiting. 08/22/19  Yes Owens Shark, NP  prochlorperazine (COMPAZINE) 5 MG tablet Take 1 tablet (5 mg total) by mouth every 6 (six) hours as needed for nausea or vomiting. May take orally or crush and take via tube. 07/16/19  Yes Tanner, Lyndon Code., PA-C  simvastatin (ZOCOR) 40 MG tablet Take 40 mg by mouth daily.   Yes [provider]  Tamsulosin HCl (FLOMAX) 0.4 MG CAPS Take 0.4 mg by mouth at bedtime.    Yes [provider]  vitamin B-12 (CYANOCOBALAMIN) 1000 MCG tablet Take 1,000 mcg by mouth daily.   Yes [provider]    Allergies    Other, Abilify [aripiprazole], and Lamictal [lamotrigine]  Review of Systems   Review of Systems  Constitutional: Negative for chills and fever.  HENT: Negative for congestion and facial swelling.   Eyes: Negative for discharge and visual disturbance.  Respiratory: Negative for shortness of breath.   Cardiovascular: Negative for chest pain and palpitations.  Gastrointestinal: Negative for abdominal pain, diarrhea and vomiting.  Musculoskeletal: Negative for arthralgias and myalgias.  Skin: Negative for color change and rash.  Neurological: Positive for dizziness and syncope. Negative for tremors and headaches.   Psychiatric/Behavioral: Negative for confusion and dysphoric mood.    Physical Exam Updated Vital Signs BP 134/77   Pulse 93   Temp 98.7 F (37.1 C)   Resp (!) 22   Ht 6\' 2"  (1.88 m)   Wt 97 kg   SpO2 96%   BMI 27.46 kg/m   Physical Exam Vitals and nursing note reviewed.  Constitutional:      Appearance: He is well-developed.  HENT:     Head: Normocephalic and atraumatic.  Eyes:     Pupils: Pupils are equal, round, and reactive to light.  Neck:     Vascular: No JVD.  Cardiovascular:     Rate and Rhythm: Normal rate and regular rhythm.     Heart sounds: No murmur. No friction rub. No gallop.  Pulmonary:     Effort: No respiratory distress.     Breath sounds: No wheezing.  Abdominal:     General: There is no distension.     Tenderness: There is no abdominal tenderness. There is no guarding or rebound.     Comments: PEG tube in place without significant erythema tenderness or drainage.  Musculoskeletal:        General: Normal range of motion.     Cervical back: Normal range of motion and neck supple.  Skin:    Coloration: Skin is not pale.     Findings: No rash.  Neurological:     Mental Status: He is alert and oriented to person, place, and time.     Cranial Nerves: Cranial nerves are intact.     Sensory: Sensation is intact.     Motor: Motor function is intact.     Coordination: Coordination is intact.     Comments: Benign neuro exam  Psychiatric:        Behavior: Behavior normal.     ED Results / Procedures / Treatments   Labs (all labs ordered are listed, but only abnormal results are displayed) Labs Reviewed  CBC WITH DIFFERENTIAL/PLATELET - Abnormal; Notable for the following components:      Result Value   WBC 10.6 (*)    Hemoglobin 12.9 (*)    HCT 37.5 (*)    Neutro Abs 8.6 (*)    Abs Immature Granulocytes 0.12 (*)    All other components within normal limits  COMPREHENSIVE METABOLIC PANEL - Abnormal; Notable for the following components:    Sodium 131 (*)    Glucose, Bld 110 (*)    Calcium 10.8 (*)    Total Protein 6.2 (*)    Albumin 3.3 (*)    Total Bilirubin 1.5 (*)    All other components within normal limits  CULTURE, BLOOD (ROUTINE X 2)  CULTURE, BLOOD (ROUTINE X 2)  LACTIC ACID, PLASMA  LACTIC ACID, PLASMA  URINALYSIS, ROUTINE W REFLEX MICROSCOPIC    EKG EKG Interpretation  Date/Time:  "Sunday Aug 25 2019 20:57:13 EDT Ventricular Rate:  96 PR Interval:    QRS Duration: 109 QT Interval:  390 QTC Calculation: 493 R Axis:   42 Text Interpretation: Sinus rhythm RSR' in V1 or V2, right VCD or RVH Borderline prolonged QT interval No significant change since last tracing Confirmed by ,  ([id number]) on 08/25/2019 9:28:01 PM   Radiology No results found.  Procedures Procedures (including critical care time)  Medications Ordered in ED Medications  sodium chloride 0.9 % bolus 1,000 mL (has no administration in time range)  prochlorperazine (COMPAZINE) injection 5 mg (5 mg Intravenous Given 08/25/19 2108)  diphenhydrAMINE (BENADRYL) injection 25 mg (25 mg Intravenous Given 08/25/19 2108)  sodium chloride 0.9 % bolus 1,000 mL (0 mLs Intravenous Stopped 08/25/19 2315)    ED Course  I have reviewed the triage vital signs and the nursing notes.  Pertinent labs & imaging results that were available during my care of the patient were reviewed by me and considered in my medical decision making (see chart for details).    MDM Rules/Calculators/A&P                      73"  yo M with a significant past medical history of chronic dizziness that comes in with a chief complaint of dizziness.  Patient states that he has been dizzy for about 24 hours now.  When asked what made  him come to the hospital today if he has had this off and on for some time he said he thinks he passed out.  So that happened while he was either reading a magazine or watching TV.  Was given a headache cocktail here lab work without a significant  finding.  He feels much better.  Will attempt to ambulate.  Patient was able to ambulate without issue.  Patient's family has breath.  His wife is able to provide further history.  States that he is really been confused and unable to provide a history for a while.  Is also been having some urinary urgency and has had some low-grade fevers as high as 99 at home.  No cough.  Has had some nausea and vomiting.  Had episode last night where he collapsed while he was in the bathroom.  No injury with the fall as far she can tell.  As the family is providing more of a infectious work-up will obtain a urine chest x-ray blood cultures lactate.  Give a bolus of IV fluids.  CT of the head for altered mental status with a history of cancer.   Signed out to Dr. Betsey Holiday, please see their note for further details of care in the ED.   The patients results and plan were reviewed and discussed.   Any x-rays performed were independently reviewed by myself.   Differential diagnosis were considered with the presenting HPI.  Medications  sodium chloride 0.9 % bolus 1,000 mL (has no administration in time range)  prochlorperazine (COMPAZINE) injection 5 mg (5 mg Intravenous Given 08/25/19 2108)  diphenhydrAMINE (BENADRYL) injection 25 mg (25 mg Intravenous Given 08/25/19 2108)  sodium chloride 0.9 % bolus 1,000 mL (0 mLs Intravenous Stopped 08/25/19 2315)    Vitals:   08/25/19 2130 08/25/19 2200 08/25/19 2230 08/25/19 2300  BP: (!) 145/79 (!) 150/74 (!) 150/81 134/77  Pulse: 91 93 (!) 110 93  Resp: 19 20 20  (!) 22  Temp:      SpO2: 95% 95% 94% 96%  Weight:      Height:        Final diagnoses:  Transient alteration of awareness     Final Clinical Impression(s) / ED Diagnoses Final diagnoses:  Transient alteration of awareness    Rx / DC Orders ED Discharge Orders    None       Deno Etienne, DO 08/26/19 1604

## 2019-08-26 ENCOUNTER — Inpatient Hospital Stay (HOSPITAL_COMMUNITY): Payer: Medicare Other

## 2019-08-26 ENCOUNTER — Telehealth: Payer: Self-pay | Admitting: Hospice

## 2019-08-26 ENCOUNTER — Ambulatory Visit: Payer: Medicare Other

## 2019-08-26 ENCOUNTER — Encounter (HOSPITAL_COMMUNITY): Payer: Self-pay | Admitting: Internal Medicine

## 2019-08-26 DIAGNOSIS — R3915 Urgency of urination: Secondary | ICD-10-CM | POA: Diagnosis present

## 2019-08-26 DIAGNOSIS — N390 Urinary tract infection, site not specified: Secondary | ICD-10-CM | POA: Diagnosis present

## 2019-08-26 DIAGNOSIS — F329 Major depressive disorder, single episode, unspecified: Secondary | ICD-10-CM | POA: Diagnosis present

## 2019-08-26 DIAGNOSIS — K2210 Ulcer of esophagus without bleeding: Secondary | ICD-10-CM | POA: Diagnosis present

## 2019-08-26 DIAGNOSIS — E785 Hyperlipidemia, unspecified: Secondary | ICD-10-CM | POA: Diagnosis present

## 2019-08-26 DIAGNOSIS — Z20822 Contact with and (suspected) exposure to covid-19: Secondary | ICD-10-CM | POA: Diagnosis present

## 2019-08-26 DIAGNOSIS — I951 Orthostatic hypotension: Secondary | ICD-10-CM | POA: Diagnosis present

## 2019-08-26 DIAGNOSIS — R55 Syncope and collapse: Secondary | ICD-10-CM | POA: Diagnosis not present

## 2019-08-26 DIAGNOSIS — G9340 Encephalopathy, unspecified: Secondary | ICD-10-CM | POA: Diagnosis present

## 2019-08-26 DIAGNOSIS — R112 Nausea with vomiting, unspecified: Secondary | ICD-10-CM | POA: Diagnosis not present

## 2019-08-26 DIAGNOSIS — J189 Pneumonia, unspecified organism: Secondary | ICD-10-CM | POA: Diagnosis present

## 2019-08-26 DIAGNOSIS — F0390 Unspecified dementia without behavioral disturbance: Secondary | ICD-10-CM | POA: Diagnosis present

## 2019-08-26 DIAGNOSIS — Z931 Gastrostomy status: Secondary | ICD-10-CM | POA: Diagnosis not present

## 2019-08-26 DIAGNOSIS — C155 Malignant neoplasm of lower third of esophagus: Secondary | ICD-10-CM | POA: Diagnosis present

## 2019-08-26 DIAGNOSIS — D649 Anemia, unspecified: Secondary | ICD-10-CM | POA: Diagnosis present

## 2019-08-26 DIAGNOSIS — N202 Calculus of kidney with calculus of ureter: Secondary | ICD-10-CM | POA: Diagnosis present

## 2019-08-26 DIAGNOSIS — R1310 Dysphagia, unspecified: Secondary | ICD-10-CM | POA: Diagnosis present

## 2019-08-26 DIAGNOSIS — Z66 Do not resuscitate: Secondary | ICD-10-CM | POA: Diagnosis present

## 2019-08-26 DIAGNOSIS — R404 Transient alteration of awareness: Secondary | ICD-10-CM | POA: Diagnosis present

## 2019-08-26 DIAGNOSIS — F419 Anxiety disorder, unspecified: Secondary | ICD-10-CM | POA: Diagnosis present

## 2019-08-26 DIAGNOSIS — F6081 Narcissistic personality disorder: Secondary | ICD-10-CM | POA: Diagnosis present

## 2019-08-26 DIAGNOSIS — Z9221 Personal history of antineoplastic chemotherapy: Secondary | ICD-10-CM | POA: Diagnosis not present

## 2019-08-26 DIAGNOSIS — E876 Hypokalemia: Secondary | ICD-10-CM | POA: Diagnosis present

## 2019-08-26 DIAGNOSIS — I10 Essential (primary) hypertension: Secondary | ICD-10-CM | POA: Diagnosis present

## 2019-08-26 DIAGNOSIS — R0602 Shortness of breath: Secondary | ICD-10-CM | POA: Diagnosis not present

## 2019-08-26 DIAGNOSIS — G9341 Metabolic encephalopathy: Secondary | ICD-10-CM | POA: Diagnosis present

## 2019-08-26 DIAGNOSIS — K209 Esophagitis, unspecified without bleeding: Secondary | ICD-10-CM | POA: Diagnosis not present

## 2019-08-26 DIAGNOSIS — Z79899 Other long term (current) drug therapy: Secondary | ICD-10-CM | POA: Diagnosis not present

## 2019-08-26 DIAGNOSIS — E871 Hypo-osmolality and hyponatremia: Secondary | ICD-10-CM | POA: Diagnosis present

## 2019-08-26 LAB — URINALYSIS, ROUTINE W REFLEX MICROSCOPIC
Bilirubin Urine: NEGATIVE
Glucose, UA: NEGATIVE mg/dL
Ketones, ur: NEGATIVE mg/dL
Nitrite: NEGATIVE
Protein, ur: NEGATIVE mg/dL
Specific Gravity, Urine: 1.014 (ref 1.005–1.030)
pH: 7 (ref 5.0–8.0)

## 2019-08-26 LAB — CBC
HCT: 34.2 % — ABNORMAL LOW (ref 39.0–52.0)
Hemoglobin: 11.5 g/dL — ABNORMAL LOW (ref 13.0–17.0)
MCH: 29.3 pg (ref 26.0–34.0)
MCHC: 33.6 g/dL (ref 30.0–36.0)
MCV: 87.2 fL (ref 80.0–100.0)
Platelets: 132 10*3/uL — ABNORMAL LOW (ref 150–400)
RBC: 3.92 MIL/uL — ABNORMAL LOW (ref 4.22–5.81)
RDW: 13.4 % (ref 11.5–15.5)
WBC: 7.6 10*3/uL (ref 4.0–10.5)
nRBC: 0 % (ref 0.0–0.2)

## 2019-08-26 LAB — BASIC METABOLIC PANEL
Anion gap: 5 (ref 5–15)
BUN: 16 mg/dL (ref 8–23)
CO2: 27 mmol/L (ref 22–32)
Calcium: 10.3 mg/dL (ref 8.9–10.3)
Chloride: 103 mmol/L (ref 98–111)
Creatinine, Ser: 1.2 mg/dL (ref 0.61–1.24)
GFR calc Af Amer: >60 mL/min (ref >60)
GFR calc non Af Amer: >60 mL/min (ref >60)
Glucose, Bld: 108 mg/dL — ABNORMAL HIGH (ref 70–99)
Potassium: 4 mmol/L (ref 3.5–5.1)
Sodium: 135 mmol/L (ref 135–145)

## 2019-08-26 LAB — GLUCOSE, CAPILLARY
Glucose-Capillary: 98 mg/dL (ref 70–99)
Glucose-Capillary: 144 mg/dL — ABNORMAL HIGH (ref 70–99)
Glucose-Capillary: 137 mg/dL — ABNORMAL HIGH (ref 70–99)

## 2019-08-26 LAB — LACTIC ACID, PLASMA: Lactic Acid, Venous: 0.8 mmol/L (ref 0.5–1.9)

## 2019-08-26 LAB — SARS CORONAVIRUS 2 BY RT PCR (HOSPITAL ORDER, PERFORMED IN ~~LOC~~ HOSPITAL LAB): SARS Coronavirus 2: NEGATIVE

## 2019-08-26 MED ORDER — VITAMIN B-12 1000 MCG PO TABS
1000.0000 ug | ORAL_TABLET | Freq: Every day | ORAL | Status: DC
  Administered 2019-08-26 – 2019-09-10 (×16): 1000 ug via ORAL
  Filled 2019-08-26 (×16): qty 1

## 2019-08-26 MED ORDER — SODIUM CHLORIDE 0.9 % IV SOLN
2.0000 g | Freq: Once | INTRAVENOUS | Status: AC
  Administered 2019-08-26: 2 g via INTRAVENOUS
  Filled 2019-08-26: qty 20

## 2019-08-26 MED ORDER — LORAZEPAM 2 MG/ML IJ SOLN
0.5000 mg | Freq: Once | INTRAMUSCULAR | Status: AC
  Administered 2019-08-26: 0.5 mg via INTRAVENOUS
  Filled 2019-08-26: qty 1

## 2019-08-26 MED ORDER — ESOMEPRAZOLE MAGNESIUM 20 MG PO PACK: 20.0000 mg | PACK | Freq: Every day | ORAL | Status: DC

## 2019-08-26 MED ORDER — ONDANSETRON HCL 4 MG PO TABS
4.0000 mg | ORAL_TABLET | Freq: Four times a day (QID) | ORAL | Status: DC | PRN
  Administered 2019-09-08 (×2): 4 mg via ORAL
  Filled 2019-08-26 (×2): qty 1

## 2019-08-26 MED ORDER — ENOXAPARIN SODIUM 40 MG/0.4ML ~~LOC~~ SOLN
40.0000 mg | SUBCUTANEOUS | Status: DC
  Administered 2019-08-26 – 2019-09-10 (×16): 40 mg via SUBCUTANEOUS
  Filled 2019-08-26 (×16): qty 0.4

## 2019-08-26 MED ORDER — ONDANSETRON HCL 4 MG/2ML IJ SOLN
4.0000 mg | Freq: Four times a day (QID) | INTRAMUSCULAR | Status: DC | PRN
  Administered 2019-08-28 – 2019-09-09 (×7): 4 mg via INTRAVENOUS
  Filled 2019-08-26 (×7): qty 2

## 2019-08-26 MED ORDER — OSMOLITE 1.2 CAL PO LIQD
1000.0000 mL | ORAL | Status: DC
Start: 2019-08-26 — End: 2019-08-26

## 2019-08-26 MED ORDER — SIMVASTATIN 40 MG PO TABS
40.0000 mg | ORAL_TABLET | Freq: Every day | ORAL | Status: DC
  Administered 2019-08-26 – 2019-08-29 (×4): 40 mg via ORAL
  Filled 2019-08-26 (×4): qty 1

## 2019-08-26 MED ORDER — PANTOPRAZOLE SODIUM 40 MG PO PACK
40.0000 mg | PACK | Freq: Every day | ORAL | Status: DC
  Administered 2019-08-26 – 2019-08-28 (×3): 40 mg via ORAL
  Filled 2019-08-26 (×4): qty 20

## 2019-08-26 MED ORDER — BUPROPION HCL ER (XL) 150 MG PO TB24
150.0000 mg | ORAL_TABLET | Freq: Every day | ORAL | Status: DC
  Administered 2019-08-26 – 2019-09-10 (×16): 150 mg via ORAL
  Filled 2019-08-26 (×16): qty 1

## 2019-08-26 MED ORDER — KATE FARMS STANDARD 1.4 PO LIQD
325.0000 mL | Freq: Three times a day (TID) | ORAL | Status: DC
  Filled 2019-08-26 (×2): qty 325

## 2019-08-26 MED ORDER — ESCITALOPRAM OXALATE 20 MG PO TABS
20.0000 mg | ORAL_TABLET | Freq: Every day | ORAL | Status: DC
  Administered 2019-08-26 – 2019-09-10 (×16): 20 mg via ORAL
  Filled 2019-08-26 (×16): qty 1

## 2019-08-26 MED ORDER — SODIUM CHLORIDE 0.9 % IV SOLN
2.0000 g | INTRAVENOUS | Status: AC
  Administered 2019-08-27 – 2019-08-31 (×5): 2 g via INTRAVENOUS
  Filled 2019-08-26 (×3): qty 20
  Filled 2019-08-26 (×2): qty 2

## 2019-08-26 MED ORDER — MIDODRINE HCL 5 MG PO TABS
10.0000 mg | ORAL_TABLET | Freq: Three times a day (TID) | ORAL | Status: DC
  Administered 2019-08-26 – 2019-09-10 (×42): 10 mg via ORAL
  Filled 2019-08-26 (×49): qty 2

## 2019-08-26 MED ORDER — TAMSULOSIN HCL 0.4 MG PO CAPS
0.4000 mg | ORAL_CAPSULE | Freq: Every day | ORAL | Status: DC
  Administered 2019-08-26 – 2019-09-09 (×15): 0.4 mg via ORAL
  Filled 2019-08-26 (×15): qty 1

## 2019-08-26 MED ORDER — KATE FARMS STANDARD 1.4 PO LIQD
487.0000 mL | ORAL | Status: DC
  Administered 2019-08-26: 487 mL
  Filled 2019-08-26: qty 650

## 2019-08-26 MED ORDER — LORAZEPAM 0.5 MG PO TABS
0.5000 mg | ORAL_TABLET | Freq: Four times a day (QID) | ORAL | Status: DC | PRN
  Administered 2019-08-27 – 2019-09-10 (×10): 0.5 mg via ORAL
  Filled 2019-08-26 (×11): qty 1

## 2019-08-26 MED ORDER — SODIUM CHLORIDE 0.9 % IV SOLN
500.0000 mg | INTRAVENOUS | Status: AC
  Administered 2019-08-26 – 2019-08-30 (×5): 500 mg via INTRAVENOUS
  Filled 2019-08-26 (×6): qty 500

## 2019-08-26 MED ORDER — MIDODRINE HCL 5 MG PO TABS: 10.0000 mg | ORAL_TABLET | Freq: Three times a day (TID) | ORAL | Status: DC

## 2019-08-26 NOTE — ED Provider Notes (Signed)
Patient signed out to me by Dr. Tyrone Nine to follow-up on work-up.  Patient has been feeling poorly for the last 3 nights.  Wife reports that he has been up a lot during the night with urinary urgency and frequency.  When he gets up he is very dizzy and unsteady on his feet.  He has had nausea and vomiting and feels very dizzy.  Head CT does not show any acute pathology.  Chest x-ray shows either atelectasis or possible aspiration.  He has had some vomiting so this is a possibility but he does not have any hypoxia or difficulty breathing.  Urinalysis does suggest possible infection.  With his urinary symptoms over the last couple of days and increased confusion, will treat.  Urine culture sent.  Wife reports that he has been very unsteady on his feet, very confused and she has not been able to handle him at home, will request hospitalist admission.   Orpah Greek, MD 08/26/19 [phone]

## 2019-08-26 NOTE — TOC Initial Note (Signed)
Transition of Care [facility]) - Initial/Assessment Note    Patient Details  Name: David [name] MRN: [id number] Date of Birth: 02-19-1946  [facility]) CM/SW Contact:    Trish Mage, LCSW Phone Number: 08/26/2019, 3:35 PM  Clinical Narrative:    Spoke with wife about patient.  She described substandard RN care at [facility] and subsequently pulled him out after a week.  He is back home again, getting HH PT through [facility] and 4 hours of PCS per day.  She wants him back in placement again.  Told her I would ask Dr for PT evaluation, and we can submit their report to insurance to see if they will authorize rehab again.  TOC will continue to follow during the course of hospitalization.                Expected Discharge Plan: ([place] v SNF) Barriers to Discharge: Insurance Authorization   Patient Goals and CMS Choice        Expected Discharge Plan and Services Expected Discharge Plan: ([place] v SNF)                                              Prior Living Arrangements/Services                       Activities of Daily Living Home Assistive Devices/Equipment: Eyeglasses, Other (Comment), Feeding equipment ADL Screening (condition at time of admission) Patient's cognitive ability adequate to safely complete daily activities?: No Is the patient deaf or have difficulty hearing?: No Does the patient have difficulty seeing, even when wearing glasses/contacts?: No Does the patient have difficulty concentrating, remembering, or making decisions?: Yes Patient able to express need for assistance with ADLs?: Yes Does the patient have difficulty dressing or bathing?: Yes Independently performs ADLs?: Yes (appropriate for developmental age) Does the patient have difficulty walking or climbing stairs?: Yes Weakness of Legs: None Weakness of Arms/Hands: None  Permission Sought/Granted                  Emotional Assessment              Admission  diagnosis:  Transient alteration of awareness [R40.4] Acute encephalopathy [G93.40] Patient Active Problem List   Diagnosis Date Noted  . Acute encephalopathy 08/26/2019  . Orthostasis 08/07/2019  . Atypical syncope 08/06/2019  . Syncope and collapse 08/06/2019  . Goals of care, counseling/discussion 07/30/2019  . Hematemesis 07/21/2019  . Dysphagia 07/05/2019  . Dementia without behavioral disturbance ([place]) 07/05/2019  . AKI (acute kidney injury) ([place]) 07/05/2019  . Esophageal cancer ([place]) 07/05/2019  . 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 Generalized anxiety disorder 10/28/2017  . Dyslipidemia 07/10/2012  . Metabolic syndrome 71/69/6789  . Prostatic hypertrophy 07/10/2012  . Testosterone deficiency 07/10/2012  . Essential hypertension 06/27/2011  . MDD (major depressive disorder), recurrent episode, mild ([place]) 05/16/2011   PCP:  Leighton Ruff, MD Pharmacy:   [facility], [place] [address] Phone: [phone] Fax: [phone]     Social Determinants of Health (SDOH) Interventions    Readmission Risk Interventions No flowsheet data found.

## 2019-08-26 NOTE — Telephone Encounter (Signed)
NP called [name] per her request as received by a [facility] Admin.  [name] reported that patient is currently in the hospital.  She also wanted to know what may be the next step after patient is discharged from the hospital.  She shared her commitment to caring for patient.  Therapeutic listening and ample emotional support provided.  NP assured [name] that after patient's stay in the hospital, he may be discharged to skilled facility or to home and that palliative will be happy to follow patient/family.  She was given [facility] number to call once patient is discharged.  She expressed appreciation for the call.

## 2019-08-26 NOTE — Progress Notes (Signed)
Called into room by NT for a fall in the bathroom.  Patient observed sitting on the toilet with his glasses on the floor and NT Kat kneeling in front of him holding a dressing to his right eye.  Patient lost his balance when sitting on the toilet and fell forward hitting his face and knees on the floor.  Small abrasions noted to right eye above and below, both sides of nose, and to right cheek.  Bruising noted to both knees as well.  MD contacted.  Spouse contacted.  VS stable.  Patient denies pain at this time.  Will continue to monitor

## 2019-08-26 NOTE — Progress Notes (Signed)
   08/26/19 1550  What Happened  Was fall witnessed? Yes  Who witnessed fall? Kat Roesel NT  Patients activity before fall bathroom-assisted  Point of contact head (knees)  Was patient injured? Yes  Follow Up  MD notified Hollice Gong  Time MD notified [phone]  Family notified Yes - comment  Time family notified 1550  Additional tests No  Simple treatment Dressing  Progress note created (see row info) Yes  Adult Fall Risk Assessment  Risk Factor Category (scoring not indicated) Fall has occurred during this admission (document High fall risk)  Patient Fall Risk Level High fall risk  Adult Fall Risk Interventions  Required Bundle Interventions *See Row Information* High fall risk - low, moderate, and high requirements implemented  Additional Interventions Use of appropriate toileting equipment (bedpan, BSC, etc.)  Screening for Fall Injury Risk (To be completed on HIGH fall risk patients) - Assessing Need for Low Bed  Risk For Fall Injury- Low Bed Criteria Previous fall this admission  Will Implement Low Bed and Floor Mats Yes  Screening for Fall Injury Risk (To be completed on HIGH fall risk patients who do not meet crieteria for Low Bed) - Assessing Need for Floor Mats Only  Risk For Fall Injury- Criteria for Floor Mats Confusion/dementia (+NuDESC, CIWA, TBI, etc.)  Will Implement Floor Mats Yes  Pain Assessment  Pain Scale 0-10  Pain Score 0  Neurological  Neuro (WDL) X  Level of Consciousness Alert  Orientation Level Oriented to person;Oriented to place;Disoriented to time;Disoriented to situation  Cognition Memory impairment  Speech Clear  R Hand Grip Present;Strong  L Hand Grip Present;Strong  R Foot Dorsiflexion Present;Strong  L Foot Dorsiflexion Present;Strong  R Foot Plantar Flexion Present;Strong  L Foot Plantar Flexion Present;Strong  Neuro Symptoms Forgetful  Musculoskeletal  Musculoskeletal (WDL) X  Assistive Device None  Generalized Weakness Yes  Weight  Bearing Restrictions No  Integumentary  Integumentary (WDL) X  Skin Color Appropriate for ethnicity  Skin Condition Dry  Skin Integrity Abrasion;Ecchymosis  Abrasion Location Eye;Face;Knee;Nose  Abrasion Location Orientation Right;Left  Abrasion Intervention Cleansed  Ecchymosis Location Knee  Ecchymosis Location Orientation Right;Left  Ecchymosis Intervention Cleansed

## 2019-08-26 NOTE — H&P (Addendum)
History and Physical    David [name] SFK:[id number] DOB: 1946-03-28 DOA: 08/25/2019  PCP: Leighton Ruff, MD  Patient coming from: Home.  History obtained from patient's wife.  Patient appears mildly confused.  Chief Complaint: Confusion fever and weakness.  HPI: David [name] is a 74 y.o. male with history of esophageal cancer PEG tube placement BPH dementia depression who was recently admitted for syncope on August 06, 2019 discharged on Aug 13, 2019 was observed to be increasingly weak over the last 4 to 5 days and getting more confused.  Patient also had some vomiting and patient's oncologist Dr. Learta Codding advised to stop the enteral feeds for 2 days.  And it was restarted again 2 days ago following which later in the evening patient had some vomiting patient wife also noticed some fevers around around 99 degrees.  Patient also has very frequent urination.  Did not complain of any pain or any shortness of breath.  Patient also has been having dizziness when he stands up.  Has some cough nonproductive.  Given these changes patient was brought to the ER.  ED Course: In the ER patient was alert awake oriented to his name and place moving all extremities.  CT head was unremarkable.  Labs show WBC of 10.6 hemoglobin 12.9 calcium was 10.8 sodium 131 lactic acid 0.8 EKG normal sinus rhythm.  Covid test was negative.  UA was concerning for UTI and chest x-ray shows possible infiltrates.  Cultures were obtained and patient started on antibiotics and admitted for further observation.  Review of Systems: As per HPI, rest all negative.   Past Medical History:  Diagnosis Date  . Anxiety   . BPH (benign prostatic hyperplasia)   . Cancer ([place])   . Dementia ([place])   . Depression   . Hyperlipidemia   . Hypertension   . Kidney stone   . Memory loss   . 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 Sleep apnea    CPAP  . Vitamin D deficiency     Past Surgical History:  Procedure  Laterality Date  . CATARACT EXTRACTION, BILATERAL  2018  . EXTERNAL EAR SURGERY     child  . EYE SURGERY Bilateral    cataract  . IR GASTROSTOMY TUBE MOD SED  07/08/2019  . right knee meniscus  06/12/2008  . right talus repair     dislocated     reports that he has never smoked. He has never used smokeless tobacco. He reports current alcohol use of about 1.0 - 2.0 standard drinks of alcohol per week. He reports current drug use. Drug: Marijuana.  Allergies  Allergen Reactions  . Other     Other reaction(s): Other Sleep walk   . Abilify [Aripiprazole] Other (See Comments)    Short term memory loss  . Lamictal [Lamotrigine]     Increased agitation    Family History  Problem Relation Age of Onset  . Alzheimer's disease Mother   . Depression Mother   . Cancer Father        prostate  . Thyroid disease Father   . Alzheimer's disease Maternal Grandmother   . Heart disease Maternal Grandfather   . Stroke Paternal Grandmother   . Stroke Paternal Grandfather   . Alcoholism Brother   . Autism Daughter   . Autism Maternal Uncle     Prior to Admission medications   Medication Sig Start Date End Date Taking? Authorizing Provider  buPROPion (WELLBUTRIN XL) 150 MG 24 hr tablet  TAKE 1 TABLET ONCE DAILY. Patient taking differently: Take 150 mg by mouth daily.  07/18/19  Yes [name], PMHNP  Cholecalciferol (VITAMIN D3 PO) Take 2,000 Units by mouth daily.   Yes [provider]  escitalopram (LEXAPRO) 20 MG tablet TAKE 1 TABLET ONCE DAILY. Patient taking differently: Take 20 mg by mouth daily.  07/18/19  Yes [name], PMHNP  esomeprazole (NEXIUM) 20 MG packet Take 20 mg by mouth daily. 07/25/19  Yes [provider]  HYDROcodone-acetaminophen (NORCO/VICODIN) 5-325 MG tablet Take 1 tablet by mouth every 6 (six) hours as needed for moderate pain. 08/13/19  Yes Eugenie Filler, MD  LORazepam (ATIVAN) 0.5 MG tablet Take 1 tablet (0.5 mg total) by mouth every 6 (six)  hours as needed for anxiety. 08/13/19  Yes Eugenie Filler, MD  midodrine (PROAMATINE) 10 MG tablet Take 1 tablet (10 mg total) by mouth 3 (three) times daily with meals. 08/23/19  Yes Ladell Pier, MD  Nutritional Supplements (FEEDING SUPPLEMENT, [employer]ATE [employer] STANDARD 1.4,) LIQD liquid 4 per day (1 bolus, 3 feeding pump) 80 ml/hr X 12 hrs  1820 calories/day or 4 cans/day 07/26/19  Yes Ladell Pier, MD  ondansetron (ZOFRAN) 8 MG tablet Take 1 tablet (8 mg total) by mouth every 8 (eight) hours as needed for nausea or vomiting. 08/22/19  Yes Owens Shark, NP  prochlorperazine (COMPAZINE) 5 MG tablet Take 1 tablet (5 mg total) by mouth every 6 (six) hours as needed for nausea or vomiting. May take orally or crush and take via tube. 07/16/19  Yes Tanner, Lyndon Code., PA-C  simvastatin (ZOCOR) 40 MG tablet Take 40 mg by mouth daily.   Yes [provider]  Tamsulosin HCl (FLOMAX) 0.4 MG CAPS Take 0.4 mg by mouth at bedtime.    Yes [provider]  vitamin B-12 (CYANOCOBALAMIN) 1000 MCG tablet Take 1,000 mcg by mouth daily.   Yes [provider]    Physical Exam: Constitutional: Moderately built and nourished. Vitals:   08/26/19 0030 08/26/19 0100 08/26/19 0130 08/26/19 0258  BP: (!) 143/71 (!) 134/59 (!) 111/99 (!) 149/76  Pulse: 80 85 84 87  Resp: (!) 23 17 17 20   Temp:    (!) 97.5 F (36.4 C)  TempSrc:    Oral  SpO2: 97% 96% 98% 97%  Weight:      Height:       Eyes: Anicteric no pallor. ENMT: No discharge from the ears eyes nose or mouth. Neck: No mass felt.  No neck rigidity. Respiratory: No rhonchi or crepitations. Cardiovascular: S1-S2 heard. Abdomen: Soft nontender bowel sounds present. Musculoskeletal: No edema. Skin: No rash. Neurologic: Alert awake oriented to place and person.  Moves all extremities. Psychiatric: Oriented to place and person.   Labs on Admission: I have personally reviewed following labs and imaging studies  CBC: Recent Labs   Lab 08/22/19 1350 08/25/19 2048  WBC 9.7 10.6*  NEUTROABS 8.0* 8.6*  HGB 14.2 12.9*  HCT 42.3 37.5*  MCV 87.4 83.9  PLT 132* 294   Basic Metabolic Panel: Recent Labs  Lab 08/22/19 1350 08/25/19 2048  NA 131* 131*  K 4.3 4.0  CL 97* 99  CO2 20* 23  GLUCOSE 179* 110*  BUN 17 21  CREATININE 1.25* 1.15  CALCIUM 11.0* 10.8*   GFR: Estimated Creatinine Clearance: 67.5 mL/min (by C-G formula based on SCr of 1.15 mg/dL). Liver Function Tests: Recent Labs  Lab 08/22/19 1350 08/25/19 2048  AST 20 25  ALT 17 22  ALKPHOS 66 53  BILITOT 1.0 1.5*  PROT 6.1* 6.2*  ALBUMIN 3.5 3.3*   No results for input(s): LIPASE, AMYLASE in the last 168 hours. No results for input(s): AMMONIA in the last 168 hours. Coagulation Profile: No results for input(s): INR, PROTIME in the last 168 hours. Cardiac Enzymes: No results for input(s): CKTOTAL, CKMB, CKMBINDEX, TROPONINI in the last 168 hours. BNP (last 3 results) No results for input(s): PROBNP in the last 8760 hours. HbA1C: No results for input(s): HGBA1C in the last 72 hours. CBG: No results for input(s): GLUCAP in the last 168 hours. Lipid Profile: No results for input(s): CHOL, HDL, LDLCALC, TRIG, CHOLHDL, LDLDIRECT in the last 72 hours. Thyroid Function Tests: No results for input(s): TSH, T4TOTAL, FREET4, T3FREE, THYROIDAB in the last 72 hours. Anemia Panel: No results for input(s): VITAMINB12, FOLATE, FERRITIN, TIBC, IRON, RETICCTPCT in the last 72 hours. Urine analysis:    Component Value Date/Time   COLORURINE YELLOW 08/25/2019 2300   APPEARANCEUR CLOUDY (A) 08/25/2019 2300   LABSPEC 1.014 08/25/2019 2300   PHURINE 7.0 08/25/2019 2300   GLUCOSEU NEGATIVE 08/25/2019 2300   HGBUR SMALL (A) 08/25/2019 2300   BILIRUBINUR NEGATIVE 08/25/2019 2300   KETONESUR NEGATIVE 08/25/2019 2300   PROTEINUR NEGATIVE 08/25/2019 2300   NITRITE NEGATIVE 08/25/2019 2300   LEUKOCYTESUR TRACE (A) 08/25/2019 2300   Sepsis  Labs: @LABRCNTIP (procalcitonin:4,lacticidven:4) )No results found for this or any previous visit (from the past 240 hour(s)).   Radiological Exams on Admission: DG Chest 2 View  Result Date: 08/25/2019 CLINICAL DATA:  Fever. EXAM: CHEST - 2 VIEW COMPARISON:  Chest radiograph 05/06/2019. PET CT 07/05/2019 FINDINGS: Upper normal heart size. Aortic atherosclerosis. Right paratracheal soft tissue density corresponds to adenopathy on PET. Streaky right infrahilar opacities. No focal airspace disease, pleural effusion, or pneumothorax. No acute osseous abnormalities are seen. IMPRESSION: 1. Streaky right infrahilar opacities may be atelectasis or potentially aspiration. 2. Right paratracheal adenopathy. Electronically Signed   By: Keith Rake M.D.   On: 08/25/2019 23:19   CT Head Wo Contrast  Result Date: 08/25/2019 CLINICAL DATA:  Encephalopathy, nausea, and dizziness EXAM: CT HEAD WITHOUT CONTRAST TECHNIQUE: Contiguous axial images were obtained from the base of the skull through the vertex without intravenous contrast. COMPARISON:  CT 08/06/2019 FINDINGS: Brain: No evidence of acute infarction, hemorrhage, hydrocephalus, extra-axial collection or mass lesion/mass effect. Symmetric prominence of the ventricles, cisterns and sulci compatible with parenchymal volume loss. Confluent and more patchy areas of white matter hypoattenuation are most compatible with chronic microvascular angiopathy. Vascular: Atherosclerotic calcification of the carotid siphons and intradural vertebral arteries. No hyperdense vessel. Skull: No calvarial fracture or suspicious osseous lesion. No scalp swelling or hematoma. Sinuses/Orbits: Paranasal sinuses and mastoid air cells are predominantly clear. Pneumatization of the petrous apices. Orbital structures are unremarkable aside from prior lens extractions. Other: None IMPRESSION: 1. No acute intracranial abnormality. 2. Chronic stable parenchymal volume loss and chronic  microvascular angiopathy. Electronically Signed   By: Lovena Le M.D.   On: 08/25/2019 23:32    EKG: Independently reviewed.  Normal sinus rhythm.  Assessment/Plan Principal Problem:   Acute encephalopathy Active Problems:   Primary cancer of lower third of esophagus (H[facility])   Dementia without behavioral disturbance ([place])    1. Acute encephalopathy could be from UTI and possible pneumonia for which we have obtained blood cultures and urine cultures started on empiric antibiotics.  If confusion gets worse may consider MRI brain.  Check ammonia level with  next blood draw. 2. Orthostatic changes check orthostatic in the morning if still orthostatic will need more fluids. 3. Nausea vomiting abdomen appears benign.  Closely observe.  If status further vomiting will need further imaging including scans.  Follow LFTs. 4. PEG tube feeds -patient has PEG tube feeds and takes enteral feedings through that and patient also takes chopped feeds through mouth. 5. Depression on Wellbutrin and Lexapro. 6. Mild hyponatremia follow metabolic panel. 7. Anemia follow CBC. 8. History of dementia presently off Aricept and Namenda. 9. Esophageal cancer being followed by oncologist.   DVT prophylaxis: Lovenox. Code Status: Full code confirmed with patient's wife. Family Communication: Patient's wife. Disposition Plan: To be determined. Consults called: None. Admission status: Observation.   Rise Patience MD [facility] Pager [phone].  If 7PM-7AM, please contact night-coverage [email or web address] Password [facility]  08/26/2019, 4:12 AM

## 2019-08-26 NOTE — Progress Notes (Signed)
Pt wife at bedside. Aware of pt fall and events surrounding fall and injury. Wife appreciative of care at this time.  Pt down to CT for scan of brain.  No needs or issues reported from the wife at this time.

## 2019-08-26 NOTE — Progress Notes (Signed)
Order request for tele sitter submitted to Lang Snow, FNP due to patient continuing to get out of bed, confusion, and to prevent patient from another fall and pulling out PEG tube. Spoke with Marlowe Alt, FNP via phone and received orders to give Ativan IV 0.5mg  and apply soft wrist restraints, per [facility] patient will remove stickers for tele sitter and continue to get out of bed.

## 2019-08-26 NOTE — Progress Notes (Signed)
No charge hospital FU note.  Patient admitted with AMS/fever, found to have UTI and possible PNA on empiric antibiotics. - COVID neg - cont abx - follow cultures

## 2019-08-26 NOTE — Progress Notes (Signed)
Initial Nutrition Assessment  INTERVENTION:   Resume night feed regimen: -[employer] 1.4 (1.5 cartons) @ 40 ml/hr x 12 hours via PEG (1800-0600) -Free water flushes of 100 ml every 4 hours (600 ml total)  Goal will be to increase to [employer] 1.4 (3 cartons) @ 80 ml/hr x 12 hours with 1 additional carton given as daily bolus. This provides 1809 kcals, 80 g protein and 1536 ml H2O.   NUTRITION DIAGNOSIS:   Increased nutrient needs related to cancer and cancer related treatments as evidenced by estimated needs.  GOAL:   Patient will meet greater than or equal to 90% of their needs  MONITOR:   TF tolerance, I & O's, Labs, Weight trends  REASON FOR ASSESSMENT:   Consult Enteral/tube feeding initiation and management  ASSESSMENT:   74 y.o. male with history of esophageal cancer PEG tube placement BPH dementia depression who was recently admitted for syncope on August 06, 2019 discharged on Aug 13, 2019 was observed to be increasingly weak over the last 4 to 5 days and getting more confused.  Patient also had some vomiting and patient's oncologist Dr. Learta Codding advised to stop the enteral feeds for 2 days.  And it was restarted again 2 days ago following which later in the evening patient had some vomiting patient wife also noticed some fevers around around 99 degrees.  Patient in room, confused and not able to provide any nutrition history. RN provided some background as pt has been in the hospital multiple times recently. Pt's wife told RN, that pt's oncologist had stopped pt's TF for 2 days d/t N/V. TF to be restarted today. Per oncology note 5/13, tube feeds to be resumed at slower rate.   RD to resume night feed regimen of [employer] 1.4 at half rate for 12 hours (1800-0600) to assess pt's tolerance. If tolerates can then advance and possibly add additional bolus during the day. Pt also consumes PO. Per documentation, pt consumed 100% of breakfast this morning of eggs, omelet, bacon,  applesauce (providing ~540 kcals and 24g protein). Tube feeding doesn't meet 100% of needs given that pt is able to consume some PO.  Current weight: 213 lbs. Per weight records, pt has lost 7 lbs since 4/26 (3% wt loss x 3 weeks, significant for time frame).  I/Os: +2.4L since admit UOP: 400 ml x 24 hrs  Labs reviewed. Medications: Vitamin B-12  NUTRITION - FOCUSED PHYSICAL EXAM:  No depletions noted.  Diet Order:   Diet Order            Diet regular Room service appropriate? Yes; Fluid consistency: Thin  Diet effective now              EDUCATION NEEDS:   No education needs have been identified at this time  Skin:  Skin Assessment: Reviewed RN Assessment  Last BM:  5/16  Height:   Ht Readings from Last 1 Encounters:  08/25/19 6\' 2"  (1.88 m)    Weight:   Wt Readings from Last 1 Encounters:  08/25/19 97 kg   BMI:  Body mass index is 27.46 kg/m.  Estimated Nutritional Needs:   Kcal:  2300-2500  Protein:  115-130g  Fluid:  2.3L/day   Clayton Bibles, MS, RD, LDN Inpatient Clinical Dietitian Contact information available via Amion

## 2019-08-27 ENCOUNTER — Inpatient Hospital Stay: Payer: Medicare Other | Admitting: Nurse Practitioner

## 2019-08-27 ENCOUNTER — Ambulatory Visit: Payer: Medicare Other

## 2019-08-27 ENCOUNTER — Inpatient Hospital Stay: Payer: Medicare Other

## 2019-08-27 DIAGNOSIS — G9340 Encephalopathy, unspecified: Secondary | ICD-10-CM

## 2019-08-27 LAB — GLUCOSE, CAPILLARY
Glucose-Capillary: 109 mg/dL — ABNORMAL HIGH (ref 70–99)
Glucose-Capillary: 120 mg/dL — ABNORMAL HIGH (ref 70–99)
Glucose-Capillary: 127 mg/dL — ABNORMAL HIGH (ref 70–99)
Glucose-Capillary: 127 mg/dL — ABNORMAL HIGH (ref 70–99)
Glucose-Capillary: 129 mg/dL — ABNORMAL HIGH (ref 70–99)
Glucose-Capillary: 86 mg/dL (ref 70–99)

## 2019-08-27 LAB — URINE CULTURE: Culture: <10000 — AB

## 2019-08-27 LAB — STREP PNEUMONIAE URINARY ANTIGEN: Strep Pneumo Urinary Antigen: NEGATIVE

## 2019-08-27 MED ORDER — FLUDROCORTISONE ACETATE 0.1 MG PO TABS
0.1000 mg | ORAL_TABLET | Freq: Every day | ORAL | Status: DC
  Administered 2019-08-27 – 2019-09-02 (×7): 0.1 mg via ORAL
  Filled 2019-08-27 (×7): qty 1

## 2019-08-27 MED ORDER — KATE FARMS STANDARD 1.4 PO LIQD
650.0000 mL | ORAL | Status: DC
  Administered 2019-08-27: 325 mL
  Administered 2019-08-28: 650 mL
  Filled 2019-08-27 (×2): qty 650

## 2019-08-27 MED ORDER — SODIUM CHLORIDE 0.9 % IV SOLN: INTRAVENOUS | Status: DC

## 2019-08-27 NOTE — Progress Notes (Signed)
Manufacturing engineer [facility]) Hospital Liaison Note  This RN was asked by Dr. Rodena Piety to contact pt's spouse David [name]. TOC Rodney made aware.  Pt is currently a palliative care pt with [facility] community palliative care program. This RN spoke with David [name] by phone to initiate conversation about hospice vs palliative care.  David [name] verbalizes desire to review goals of care with Dr Benay Spice and Dr. Rodena Piety, requests referring to bereavement counselor for daughter with Autism.  Plan agreed on for this RN to contact Windy Canny, [facility] palliative NP, to make her aware.  Hospital Liaison team will contact [place] on Thursday, 08/29/19, to follow-up unless earlier contact is requested.  Thank you for the opportunity to participate in this pt's care.  Domenic Moras, BSN, [facility] [employer] (in [place]) [phone] (24h on call)

## 2019-08-27 NOTE — Progress Notes (Addendum)
HEMATOLOGY-ONCOLOGY PROGRESS NOTE  SUBJECTIVE: The patient was brought to the hospital secondary to confusion, fever, and weakness.  Had a fall last evening when walking to the bathroom.  Head injury just above the right eye.  Steri-Strips intact.  Has a few small abrasions on the right side of his cheek.  CT of the head did not show any evidence of bleeding.  Reports mild fatigue this morning.  Denies nausea.  No vomiting.  States he has no difficulty swallowing this morning.  Reports mild intermittent dizziness.  Chest x-ray on admission showed possible infiltrates and he was placed on antibiotics.  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     PHYSICAL EXAMINATION:  Vitals:   08/26/19 2004 08/27/19 0401  BP: 105/76 133/69  Pulse: 97 91  Resp: 18 19  Temp: 98.5 F (36.9 C) 98.5 F (36.9 C)  SpO2: 99% 94%   Filed Weights   08/25/19 2046 08/27/19 0500  Weight: 97 kg 101.6 kg    Intake/Output from previous day: 05/17 0701 - 05/18 0700 In: 983.8 [P.O.:450; IV Piggyback:533.8] Out: 1000 [Urine:1000]  GENERAL:alert, no distress and comfortable OROPHARYNX: No thrush or mucositis LUNGS: clear to auscultation and percussion with normal breathing effort HEART: regular rate & rhythm and no murmurs and no lower extremity edema ABDOMEN: Positive bowel sounds, soft, nontender, left upper abdomen feeding tube in place  NEURO: alert and follows commands  LABORATORY DATA:  I have reviewed the data as listed CMP Latest Ref Rng & Units 08/26/2019 08/25/2019 08/22/2019  Glucose 70 - 99 mg/dL 108(H) 110(H) 179(H)  BUN 8 - 23 mg/dL _0 Creatinine 0.61 - 1.24  mg/dL 1.20 1.15 1.25(H)  Sodium 135 - 145 mmol/L 135 131(L) 131(L)  Potassium 3.5 - 5.1 mmol/L 4.0 4.0 4.3  Chloride 98 - 111 mmol/L 103 99 97(L)  CO2 22 - 32 mmol/L 27 23 20(L)  Calcium 8.9 - 10.3 mg/dL 10.3 10.8(H) 11.0(H)  Total Protein 6.5 - 8.1 g/dL - 6.2(L) 6.1(L)  Total Bilirubin 0.3 - 1.2 mg/dL - 1.5(H) 1.0  Alkaline Phos 38 - 126 U/L - 53 66  AST 15 - 41 U/L - 25 20  ALT 0 - 44 U/L - 22 17    Lab Results  Component Value Date   WBC 7.6 08/26/2019   HGB 11.5 (L) 08/26/2019   HCT 34.2 (L) 08/26/2019   MCV 87.2 08/26/2019   PLT 132 (L) 08/26/2019   NEUTROABS 8.6 (H) 08/25/2019    DG Chest 2 View  Result Date: 08/25/2019 CLINICAL DATA:  Fever. EXAM: CHEST - 2 VIEW COMPARISON:  Chest radiograph 05/06/2019. PET CT 07/05/2019 FINDINGS: Upper normal heart size. Aortic atherosclerosis. Right paratracheal soft tissue density corresponds to adenopathy on PET. Streaky right infrahilar opacities. No focal airspace disease, pleural effusion, or pneumothorax. No acute osseous abnormalities are seen. IMPRESSION: 1. Streaky right infrahilar opacities may be atelectasis or potentially aspiration. 2. Right paratracheal adenopathy. Electronically Signed   By: Keith Rake M.D.   On: 08/25/2019 23:19   CT HEAD WO CONTRAST  Result Date: 08/26/2019 CLINICAL DATA:  Facial trauma, fall EXAM: CT HEAD WITHOUT CONTRAST TECHNIQUE: Contiguous  axial images were obtained from the base of the skull through the vertex without intravenous contrast. COMPARISON:  08/25/2019 FINDINGS: Brain: There is atrophy and chronic small vessel disease changes. No acute intracranial abnormality. Specifically, no hemorrhage, hydrocephalus, mass lesion, acute infarction, or significant intracranial injury. Vascular: No hyperdense vessel or unexpected calcification. Skull: No acute calvarial abnormality. Sinuses/Orbits: Visualized paranasal sinuses and mastoids clear. Orbital soft tissues unremarkable. Other: None IMPRESSION:  Atrophy, chronic microvascular disease. No acute intracranial abnormality. Electronically Signed   By: Rolm Baptise M.D.   On: 08/26/2019 18:31   CT Head Wo Contrast  Result Date: 08/25/2019 CLINICAL DATA:  Encephalopathy, nausea, and dizziness EXAM: CT HEAD WITHOUT CONTRAST TECHNIQUE: Contiguous axial images were obtained from the base of the skull through the vertex without intravenous contrast. COMPARISON:  CT 08/06/2019 FINDINGS: Brain: No evidence of acute infarction, hemorrhage, hydrocephalus, extra-axial collection or mass lesion/mass effect. Symmetric prominence of the ventricles, cisterns and sulci compatible with parenchymal volume loss. Confluent and more patchy areas of white matter hypoattenuation are most compatible with chronic microvascular angiopathy. Vascular: Atherosclerotic calcification of the carotid siphons and intradural vertebral arteries. No hyperdense vessel. Skull: No calvarial fracture or suspicious osseous lesion. No scalp swelling or hematoma. Sinuses/Orbits: Paranasal sinuses and mastoid air cells are predominantly clear. Pneumatization of the petrous apices. Orbital structures are unremarkable aside from prior lens extractions. Other: None IMPRESSION: 1. No acute intracranial abnormality. 2. Chronic stable parenchymal volume loss and chronic microvascular angiopathy. Electronically Signed   By: Lovena Le M.D.   On: 08/25/2019 23:32   CT HEAD WO CONTRAST  Result Date: 08/06/2019 CLINICAL DATA:  Recurrent syncope EXAM: CT HEAD WITHOUT CONTRAST TECHNIQUE: Contiguous axial images were obtained from the base of the skull through the vertex without intravenous contrast. COMPARISON:  05/06/2019 FINDINGS: Brain: There is no mass, hemorrhage or extra-axial collection. The appearance of the white matter is normal for the patient's age. There is generalized atrophy. Vascular: No abnormal hyperdensity of the major intracranial arteries or dural venous sinuses. No intracranial  atherosclerosis. Skull: The visualized skull base, calvarium and extracranial soft tissues are normal. Sinuses/Orbits: No fluid levels or advanced mucosal thickening of the visualized paranasal sinuses. No mastoid or middle ear effusion. The orbits are normal. IMPRESSION: Generalized atrophy without acute intracranial abnormality. Electronically Signed   By: Ulyses Jarred M.D.   On: 08/06/2019 21:42   MR BRAIN WO CONTRAST  Result Date: 08/09/2019 CLINICAL DATA:  Dizziness, dehydration or hypotension. Additional history provided: Medical history significant for esophageal cancer on palliative chemotherapy, hypertension, dementia, hyperlipidemia, BPH, anxiety disorder presenting with intermittent dizziness. EXAM: MRI HEAD WITHOUT CONTRAST TECHNIQUE: Multiplanar, multiecho pulse sequences of the brain and surrounding structures were obtained without intravenous contrast. COMPARISON:  Noncontrast head CT 08/06/2019, brain MRI 10/28/2017. FINDINGS: Brain: Please note evaluation for intracranial metastatic disease is limited on this noncontrast study. There are few scattered punctate foci of T2/FLAIR hyperintensity within the cerebral white matter, unchanged and nonspecific. Stable moderate correct atrophy with somewhat disproportionate involvement of the medial temporal lobes. There is no acute infarct. No evidence of intracranial mass. No chronic intracranial blood products. No extra-axial fluid collection. No midline shift. Vascular: Expected proximal arterial flow voids. Skull and upper cervical spine: No focal marrow lesion. Sinuses/Orbits: Visualized orbits show no acute finding. Minimal ethmoid sinus mucosal thickening. No significant mastoid effusion. IMPRESSION: 1. Please note evaluation for intracranial metastatic disease is limited on this non-contrast exam. 2. No evidence of acute intracranial abnormality. 3. Stable moderate parenchymal atrophy  with somewhat disproportionate involvement of the medial  temporal lobes. 4. A few scattered punctate T2 hyperintense signal changes within the cerebral white matter are unchanged and nonspecific. 5. Minimal ethmoid sinus mucosal thickening. Electronically Signed   By: Kellie Simmering DO   On: 08/09/2019 15:38   DG ABDOMEN PEG TUBE LOCATION  Result Date: 08/05/2019 CLINICAL DATA:  Peg tube evaluation. EXAM: ABDOMEN - 1 VIEW COMPARISON:  IR gastrostomy placement 07/08/2019 FINDINGS: A single frontal view of the abdomen is provided. A PEG tube balloon is seen in the left upper quadrant projecting over the stomach. There is contrast within the stomach and proximal duodenum. No evidence of extraluminal contrast. The bowel gas pattern is nonobstructive. IMPRESSION: PEG tube balloon projects over the stomach. Contrast is seen within the stomach and proximal duodenum. No evidence of extraluminal contrast. Electronically Signed   By: Audie Pinto M.D.   On: 08/05/2019 18:06    ASSESSMENT AND PLAN: 1. Esophagus cancer ? CT 05/06/2019-circumferential thickening of the distal esophagus, mild splenomegaly, bladder wall thickening, enlarged prostate ? Upper endoscopy 06/11/2019-ulcerated mass at the lower esophagus, 36 cm from the incisors, partially obstructing, biopsy revealed atypical cells-reactive change versus poorly differentiated neoplasm, outside consultation at [facility] on this biopsy and a repeat biopsy 06/21/2019 confirmed a poorly differentiated malignant neoplasm ? HER-2 negative, PD-L1 combined positive score -20 ? Repeat endoscopy 06/21/2019-partially obstructing esophageal tumor found in the lower third of the esophagus ? PET scan 07/05/2019-large esophageal mass with nodal involvement both above and below the diaphragm including a left periaortic node ? Palliative radiation to the esophagus mass starting 07/22/2019, completed 08/09/2019 ? Cycle 1 pembrolizumab 08/15/2019 2. Dementia 3. Anxiety/depression 4. Sleep apnea 5. BPH 6. Dysphagia secondary to  #1  Gastrostomy tube placement 07/08/2019 7. Hyperlipidemia 8. Admission with hematemesis 07/21/2019 9. Admission for dehydration and pre-syncope in clinic 08/06/19  David [name] appears stable.  Still reports some mild, intermittent dizziness.  He is not having any problems with dysphagia or nausea this morning.  TOC note reviewed and trying to seek SNF placement if possible and if not home with home health.  He has mild anemia and thrombocytopenia which is stable.  Recommendations: 1.  Obtain orthostatic vital signs today 2.  PT/OT evaluation 3.  Antibiotics per hospitalist for treatment of possible pneumonia 4.  Hold tube feedings for recurrent nausea and advance diet as tolerated 5.  Consider holding Flomax and beginning trial of Florinef   LOS: 1 day   Mikey Bussing, DNP, AGPCNP-BC, AOCNP 08/27/19 David [name] was admitted with recurrent vomiting and orthostasis.  He appears well this morning.  The etiology of the nausea remains unclear.  It is possible the nausea is related to tube feedings, gastroesophageal cancer, or radiation.  The nausea predates pembrolizumab and would not be a typical toxicity associated with this agent.  The plan is to continue outpatient treatment with pembrolizumab next week.  David [name] will most likely require skilled nursing facility placement at discharge.  I discussed the case with his wife today.  Julieanne Manson, MD

## 2019-08-27 NOTE — Progress Notes (Signed)
RN called pt's spouse to provide update. Spouse made aware that pt was increasingly confused overnight and at risk of self harm, Ativan & soft restraints were ordered but have since been removed. Advised spouse pt is currently in waist belt and planning to implement 1:1 safety sitter. Spouse was understanding and verbalized concern bringing pt home if his dementia appears to be worsening. Spouse became tearful at potential plan for discharge to long term care facility. Spouse states she is currently at work but plans to visit pt after 3pm today. Spouse requested to be contacted by Dr. Benay Spice for update and social work for assistance in finding long term care facility. Dr. Benay Spice paged. Social work notified.

## 2019-08-27 NOTE — Progress Notes (Signed)
PROGRESS NOTE    David [name]  ZJI:[id number] DOB: Mar 14, 1946 DOA: 08/25/2019 PCP: Leighton Ruff, MD   Brief Narrative:74 y.o. male with history of esophageal cancer PEG tube placement BPH dementia depression who was recently admitted for syncope on August 06, 2019 discharged on Aug 13, 2019 was observed to be increasingly weak over the last 4 to 5 days and getting more confused.  Patient also had some vomiting and patient's oncologist Dr. Learta Codding advised to stop the enteral feeds for 2 days.  And it was restarted again 2 days ago following which later in the evening patient had some vomiting patient wife also noticed some fevers around around 99 degrees.  Patient also has very frequent urination.  Did not complain of any pain or any shortness of breath.  Patient also has been having dizziness when he stands up.  Has some cough nonproductive.  Given these changes patient was brought to the ER.  ED Course: In the ER patient was alert awake oriented to his name and place moving all extremities.  CT head was unremarkable.  Labs show WBC of 10.6 hemoglobin 12.9 calcium was 10.8 sodium 131 lactic acid 0.8 EKG normal sinus rhythm.  Covid test was negative.  UA was concerning for UTI and chest x-ray shows possible infiltrates.  Cultures were obtained and patient started on antibiotics and admitted for further observation.  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  Chest x-ray findings consistent with infiltrates.  Patient admitted with cough and confusion.  Urine culture shows no growth.  Patient still confused though better than yesterday per staff.  On Rocephin and azithromycin.  #2  Nausea and vomiting appears to have resolved.  #3 orthostatic patient is profoundly orthostatic with a blood pressure of  150/80 laying down to 100/68 on standing.  Start normal saline.  #4 status post PEG tube-continue feeds.  #5 depression on Wellbutrin and Lexapro  #6 history of dementia worsening  #7 history of esophageal cancer followed by Dr. Benay Spice.  #8 goals of care discussed with patient's wife, has living will at home.  CODE STATUS changed to DNR which is appropriate for the patient.  She also expressed a desire to speak with hospice.  I have discussed with [facility] who will contact her.     Nutrition Problem: Increased nutrient needs Etiology: cancer and cancer related treatments     Signs/Symptoms: estimated needs    Interventions: Tube feeding  Estimated body mass index is 28.76 kg/m as calculated from the following:   Height as of this encounter: 6\' 2"  (1.88 m).   Weight as of this encounter: 101.6 kg.  DVT prophylaxis: lovenox Code Status: dnr Family Communication: dw wife Disposition Plan:  Status is: Inpatient   Dispo: The patient is from: home              Anticipated d/c is to: snf              Anticipated d/c date is: unknown              Patient currently is not medically stable to d/c.   Consultants: none  Procedures:none Antimicrobials:rocephin and azithromycin  Subjective: Patient resting in bed   Objective: Vitals:   08/26/19 1557 08/26/19 2004 08/27/19 0401 08/27/19 0500  BP: (!) 154/90 105/76 133/69  Pulse: 84 97 91   Resp: (!) 21 18 19    Temp: (!) 97.5 F (36.4 C) 98.5 F (36.9 C) 98.5 F (36.9 C)   TempSrc: Oral Oral Oral   SpO2: 100% 99% 94%   Weight:    101.6 kg  Height:        Intake/Output Summary (Last 24 hours) at 08/27/2019 1227 Last data filed at 08/27/2019 1150 Gross per 24 hour  Intake 773.83 ml  Output 900 ml  Net -126.17 ml   Filed Weights   08/25/19 2046 08/27/19 0500  Weight: 97 kg 101.6 kg    Examination:  General exam: Appears awake asking for urinal Respiratory system: Clear to auscultation.  Respiratory effort normal. Cardiovascular system: S1 & S2 heard, RRR. No JVD, murmurs, rubs, gallops or clicks. No pedal edema. Gastrointestinal system: Abdomen is nondistended, soft and nontender. No organomegaly or masses felt. Normal bowel sounds heard.  PEG tube in place Central nervous system awake, confused extremities: Symmetric 5 x 5 power. Skin: No rashes, lesions or ulcers Psychiatry: Unable to assess    Data Reviewed: I have personally reviewed following labs and imaging studies  CBC: Recent Labs  Lab 08/22/19 1350 08/25/19 2048 08/26/19 0450  WBC 9.7 10.6* 7.6  NEUTROABS 8.0* 8.6*  --   HGB 14.2 12.9* 11.5*  HCT 42.3 37.5* 34.2*  MCV 87.4 83.9 87.2  PLT 132* 173 712*   Basic Metabolic Panel: Recent Labs  Lab 08/22/19 1350 08/25/19 2048 08/26/19 0450  NA 131* 131* 135  K 4.3 4.0 4.0  CL 97* 99 103  CO2 20* 23 27  GLUCOSE 179* 110* 108*  BUN 17 21 16   CREATININE 1.25* 1.15 1.20  CALCIUM 11.0* 10.8* 10.3   GFR: Estimated Creatinine Clearance: 70.8 mL/min (by C-G formula based on SCr of 1.2 mg/dL). Liver Function Tests: Recent Labs  Lab 08/22/19 1350 08/25/19 2048  AST 20 25  ALT 17 22  ALKPHOS 66 53  BILITOT 1.0 1.5*  PROT 6.1* 6.2*  ALBUMIN 3.5 3.3*   No results for input(s): LIPASE, AMYLASE in the last 168 hours. No results for input(s): AMMONIA in the last 168 hours. Coagulation Profile: No results for input(s): INR, PROTIME in the last 168 hours. Cardiac Enzymes: No results for input(s): CKTOTAL, CKMB, CKMBINDEX, TROPONINI in the last 168 hours. BNP (last 3 results) No results for input(s): PROBNP in the last 8760 hours. HbA1C: No results for input(s): HGBA1C in the last 72 hours. CBG: Recent Labs  Lab 08/26/19 2004 08/26/19 2326 08/27/19 0350 08/27/19 0811 08/27/19 1154  GLUCAP 144* 137* 129* 127* 120*   Lipid Profile: No results for input(s): CHOL, HDL, LDLCALC, TRIG, CHOLHDL, LDLDIRECT in the last 72 hours. Thyroid Function  Tests: No results for input(s): TSH, T4TOTAL, FREET4, T3FREE, THYROIDAB in the last 72 hours. Anemia Panel: No results for input(s): VITAMINB12, FOLATE, FERRITIN, TIBC, IRON, RETICCTPCT in the last 72 hours. Sepsis Labs: Recent Labs  Lab 08/25/19 2355  LATICACIDVEN 0.8    Recent Results (from the past 240 hour(s))  Blood culture (routine x 2)     Status: None (Preliminary result)   Collection Time: 08/25/19 11:00 PM   Specimen: BLOOD  Result Value Ref Range Status   Specimen Description   Final    BLOOD LEFT ANTECUBITAL Performed at [place] [facility] [facility] [address] [place], [place] [address]    Special Requests   Final    BOTTLES DRAWN AEROBIC AND ANAEROBIC Blood Culture adequate volume Performed at  [facility], [place] [address], [place], [place] [address]    Culture   Final    NO GROWTH 1 DAY Performed at [facility], [place] [address] [place], [place] [address]    Report Status PENDING  Incomplete  Blood culture (routine x 2)     Status: None (Preliminary result)   Collection Time: 08/25/19 11:04 PM   Specimen: BLOOD  Result Value Ref Range Status   Specimen Description   Final    BLOOD LEFT HAND Performed at [place] [address], [place], [place] [address]    Special Requests   Final    BOTTLES DRAWN AEROBIC AND ANAEROBIC Blood Culture adequate volume Performed at [place] [address], [place], [place] [address]    Culture   Final    NO GROWTH 1 DAY Performed at [facility], [place] [address] [place], [place] [address]    Report Status PENDING  Incomplete  Urine Culture     Status: Abnormal   Collection Time: 08/26/19 12:58 AM   Specimen: Urine, Random  Result Value Ref Range Status   Specimen Description   Final    URINE, RANDOM Performed at [place] [facility] [address] [place], [place] [address]    Special Requests   Final    NONE Performed  at [facility], [place] [address] [place], [place] [address]    Culture (A)  Final    <10,000 COLONIES/mL INSIGNIFICANT GROWTH Performed at [facility] [facility] [address], [place], [place] [address]    Report Status 08/27/2019 FINAL  Final  SARS Coronavirus 2 by RT PCR (hospital order, performed in [facility] hospital lab) Nasopharyngeal Nasopharyngeal Swab     Status: None   Collection Time: 08/26/19  1:06 AM   Specimen: Nasopharyngeal Swab  Result Value Ref Range Status   SARS Coronavirus 2 NEGATIVE NEGATIVE Final    Comment: (NOTE) SARS-CoV-2 target nucleic acids are NOT DETECTED. The SARS-CoV-2 RNA is generally detectable in upper and lower respiratory specimens during the acute phase of infection. The lowest concentration of SARS-CoV-2 viral copies this assay can detect is 250 copies / mL. A negative result does not preclude SARS-CoV-2 infection and should not be used as the sole basis for treatment or other patient management decisions.  A negative result may occur with improper specimen collection / handling, submission of specimen other than nasopharyngeal swab, presence of viral mutation(s) within the areas targeted by this assay, and inadequate number of viral copies (<250 copies / mL). A negative result must be combined with clinical observations, patient history, and epidemiological information. Fact Sheet for Patients:   StrictlyIdeas.no Fact Sheet for Healthcare Providers: BankingDealers.co.za This test is not yet approved or cleared  by the [place] FDA and has been authorized for detection and/or diagnosis of SARS-CoV-2 by FDA under an Emergency Use Authorization (EUA).  This EUA will remain in effect (meaning this test can be used) for the duration of the COVID-19 declaration under Section 564(b)(1) of the Act, 21 U.S.C. section 360bbb-3(b)(1), unless the authorization is terminated  or revoked sooner. Performed at [facility], [place] [address] [place], [place] [address]          Radiology Studies: DG Chest 2 View  Result Date: 08/25/2019 CLINICAL DATA:  Fever. EXAM: CHEST - 2 VIEW COMPARISON:  Chest radiograph 05/06/2019. PET CT 07/05/2019 FINDINGS: Upper normal heart size. Aortic atherosclerosis.  Right paratracheal soft tissue density corresponds to adenopathy on PET. Streaky right infrahilar opacities. No focal airspace disease, pleural effusion, or pneumothorax. No acute osseous abnormalities are seen. IMPRESSION: 1. Streaky right infrahilar opacities may be atelectasis or potentially aspiration. 2. Right paratracheal adenopathy. Electronically Signed   By: Keith Rake M.D.   On: 08/25/2019 23:19   CT HEAD WO CONTRAST  Result Date: 08/26/2019 CLINICAL DATA:  Facial trauma, fall EXAM: CT HEAD WITHOUT CONTRAST TECHNIQUE: Contiguous axial images were obtained from the base of the skull through the vertex without intravenous contrast. COMPARISON:  08/25/2019 FINDINGS: Brain: There is atrophy and chronic small vessel disease changes. No acute intracranial abnormality. Specifically, no hemorrhage, hydrocephalus, mass lesion, acute infarction, or significant intracranial injury. Vascular: No hyperdense vessel or unexpected calcification. Skull: No acute calvarial abnormality. Sinuses/Orbits: Visualized paranasal sinuses and mastoids clear. Orbital soft tissues unremarkable. Other: None IMPRESSION: Atrophy, chronic microvascular disease. No acute intracranial abnormality. Electronically Signed   By: Rolm Baptise M.D.   On: 08/26/2019 18:31   CT Head Wo Contrast  Result Date: 08/25/2019 CLINICAL DATA:  Encephalopathy, nausea, and dizziness EXAM: CT HEAD WITHOUT CONTRAST TECHNIQUE: Contiguous axial images were obtained from the base of the skull through the vertex without intravenous contrast. COMPARISON:  CT 08/06/2019 FINDINGS: Brain: No evidence of  acute infarction, hemorrhage, hydrocephalus, extra-axial collection or mass lesion/mass effect. Symmetric prominence of the ventricles, cisterns and sulci compatible with parenchymal volume loss. Confluent and more patchy areas of white matter hypoattenuation are most compatible with chronic microvascular angiopathy. Vascular: Atherosclerotic calcification of the carotid siphons and intradural vertebral arteries. No hyperdense vessel. Skull: No calvarial fracture or suspicious osseous lesion. No scalp swelling or hematoma. Sinuses/Orbits: Paranasal sinuses and mastoid air cells are predominantly clear. Pneumatization of the petrous apices. Orbital structures are unremarkable aside from prior lens extractions. Other: None IMPRESSION: 1. No acute intracranial abnormality. 2. Chronic stable parenchymal volume loss and chronic microvascular angiopathy. Electronically Signed   By: Lovena Le M.D.   On: 08/25/2019 23:32        Scheduled Meds: . buPROPion  150 mg Oral Daily  . enoxaparin (LOVENOX) injection  40 mg Subcutaneous Q24H  . escitalopram  20 mg Oral Daily  . feeding supplement ([employer] STANDARD 1.4)  650 mL Per Tube Q24H  . midodrine  10 mg Oral TID WC  . pantoprazole sodium  40 mg Oral Daily  . simvastatin  40 mg Oral Daily  . tamsulosin  0.4 mg Oral QHS  . vitamin B-12  1,000 mcg Oral Daily   Continuous Infusions: . azithromycin 500 mg (08/27/19 0453)  . cefTRIAXone (ROCEPHIN)  IV 2 g (08/27/19 0229)     LOS: 1 day     Georgette Shell, MD 08/27/2019, 12:27 PM

## 2019-08-27 NOTE — TOC Progression Note (Signed)
Transition of Care [facility]) - Progression Note    Patient Details  Name: David [name] MRN: [id number] Date of Birth: 03-04-1946  Transition of Care [facility]) CM/SW [place], [place] Phone Number: 08/27/2019, 2:44 PM  Clinical Narrative:   PT note seen, appreciated.  Information sent to insurance for authorization for SNF request. TOC will continue to follow during the course of hospitalization.     Expected Discharge Plan: ([place] v SNF) Barriers to Discharge: Insurance Authorization  Expected Discharge Plan and Services Expected Discharge Plan: ([place] v SNF)                                               Social Determinants of Health (SDOH) Interventions    Readmission Risk Interventions No flowsheet data found.

## 2019-08-27 NOTE — Progress Notes (Signed)
Nutrition Follow-up  INTERVENTION:   Advance night feed regimen: -[employer] 1.4 (2 cartons) @ 55 ml/hr x 12 hours via PEG (1800-0600) -Free water flushes of 100 ml every 4 hours (600 ml total)  Goal will be to increase to [employer] 1.4 (3 cartons) @ 80 ml/hr x 12 hours with 1 additional carton given as daily bolus. This provides 1809 kcals, 80 g protein and 1536 ml H2O.   NUTRITION DIAGNOSIS:   Increased nutrient needs related to cancer and cancer related treatments as evidenced by estimated needs.  Ongoing.  GOAL:   Patient will meet greater than or equal to 90% of their needs  Progressing.  MONITOR:   TF tolerance, I & O's, Labs, Weight trends  ASSESSMENT:   74 y.o. male with history of esophageal cancer PEG tube placement BPH dementia depression who was recently admitted for syncope on August 06, 2019 discharged on Aug 13, 2019 was observed to be increasingly weak over the last 4 to 5 days and getting more confused.  Patient also had some vomiting and patient's oncologist Dr. Learta Codding advised to stop the enteral feeds for 2 days.  And it was restarted again 2 days ago following which later in the evening patient had some vomiting patient wife also noticed some fevers around around 99 degrees.  Patient continues to be confused. Pt had fall 5/17 per chart review.  Patient with no N/V following resumption of night regimen. Will advance tonight to 2 cartons at 55 ml/hr. If tolerates can then advance to goal tomorrow night.   Admission weight: 213 lbs. Current weight: 223 lbs.  I/Os: +2L since admit  UOP: 1 L x 24 hrs  Medications: Vitamin B-12 Labs reviewed: CBGs: 127-129  Diet Order:   Diet Order            Diet regular Room service appropriate? Yes; Fluid consistency: Thin  Diet effective now              EDUCATION NEEDS:   No education needs have been identified at this time  Skin:  Skin Assessment: Reviewed RN Assessment  Last BM:  5/16  Height:   Ht  Readings from Last 1 Encounters:  08/25/19 6\' 2"  (1.88 m)    Weight:   Wt Readings from Last 1 Encounters:  08/27/19 101.6 kg    Ideal Body Weight:     BMI:  Body mass index is 28.76 kg/m.  Estimated Nutritional Needs:   Kcal:  2300-2500  Protein:  115-130g  Fluid:  2.3L/day  Clayton Bibles, MS, RD, LDN Inpatient Clinical Dietitian Contact information available via Amion

## 2019-08-27 NOTE — Progress Notes (Signed)
Patient dropped his eye glasses on the floor and broke the eye glass frame. Glasses and lens are both laying on the patients table in his room.

## 2019-08-27 NOTE — Evaluation (Signed)
Physical Therapy Evaluation Patient Details Name: David [name] MRN: [id number] DOB: [birth year] Today's Date: 08/27/2019   History of Present Illness  74 y.o. male with medical history significant of esophageal cancer on palliative chemotherapy, hypertension, dementia, hyperlipidemia, BPH and anxiety disorder and admitted with AMS/fever, found to have UTI and possible PNA .  Pt with recent admission for presyncope likely secondary to orthostasis.  Clinical Impression  Pt admitted with above diagnosis.  Pt currently with functional limitations due to the deficits listed below (see PT Problem List). Pt will benefit from skilled PT to increase their independence and safety with mobility to allow discharge to the venue listed below.  Pt limited by orthostatic BP, BP also dropped after marching in place for 1 minute.      08/27/19 1115  Vital Signs  BP Location Right Arm  BP Method Automatic  Patient Position (if appropriate) Orthostatic Vitals  Orthostatic Lying   BP- Lying 150/80  Pulse- Lying 90  Orthostatic Sitting  BP- Sitting 148/78  Pulse- Sitting 97  Orthostatic Standing at 0 minutes  BP- Standing at 0 minutes 100/68  Pulse- Standing at 0 minutes 104  Orthostatic Standing at 3 minutes  BP- Standing at 3 minutes 131/81  Pulse- Standing at 3 minutes 110   BP after marching in place for approx 1 minute: 118/66 mmHg, HR 113 bpm     Follow Up Recommendations Home health PT;Supervision/Assistance - 24 hour    Equipment Recommendations  None recommended by PT    Recommendations for Other Services       Precautions / Restrictions Precautions Precautions: Fall Precaution Comments: G tube, orthostatic      Mobility  Bed Mobility Overal bed mobility: Needs Assistance Bed Mobility: Supine to Sit;Sit to Supine     Supine to sit: Supervision Sit to supine: Supervision   General bed mobility comments: for safety  Transfers Overall transfer level: Needs  assistance Equipment used: None Transfers: Sit to/from Stand Sit to Stand: Min assist         General transfer comment: assist to steady with rise from bed, posterior lean against bed with standing; orthostatic with standing, improved after 3 minutes however dropped again with marching in place for one minute so deferred ambulation at this time (pt with fall in bathroom last night)  Ambulation/Gait             General Gait Details: deferred d/t BP  Stairs            Wheelchair Mobility    Modified Rankin (Stroke Patients Only)       Balance Overall balance assessment: Mild deficits observed, not formally tested                                           Pertinent Vitals/Pain Pain Assessment: No/denies pain    Home Living Family/patient expects to be discharged to:: Private residence Living Arrangements: Alone Available Help at Discharge: Family;Available PRN/intermittently Type of Home: Other(Comment)(Condo) Home Access: Stairs to enter   Entrance Stairs-Number of Steps: 2nd floor unit Home Layout: One level Home Equipment: None Additional Comments: Reports his wife - who he is separated from - assists him with keeping things in order and managing his appointments.    Prior Function Level of Independence: Independent         Comments: Reports independence with adls and ambulation.  Hand Dominance        Extremity/Trunk Assessment        Lower Extremity Assessment Lower Extremity Assessment: Overall WFL for tasks assessed    Cervical / Trunk Assessment Cervical / Trunk Assessment: Normal  Communication   Communication: No difficulties  Cognition Arousal/Alertness: Awake/alert Behavior During Therapy: WFL for tasks assessed/performed Overall Cognitive Status: History of cognitive impairments - at baseline                                 General Comments: pleasant, reports he is coming out of his  "fog"      General Comments      Exercises     Assessment/Plan    PT Assessment Patient needs continued PT services  PT Problem List Decreased strength;Decreased mobility;Decreased knowledge of use of DME;Decreased activity tolerance;Decreased balance;Cardiopulmonary status limiting activity;Decreased safety awareness       PT Treatment Interventions Gait training;Therapeutic exercise;DME instruction;Therapeutic activities;Functional mobility training;Stair training;Balance training;Patient/family education    PT Goals (Current goals can be found in the Care Plan section)  Acute Rehab PT Goals PT Goal Formulation: With patient Time For Goal Achievement: 09/17/19 Potential to Achieve Goals: Good    Frequency Min 3X/week   Barriers to discharge        Co-evaluation               AM-PAC PT "6 Clicks" Mobility  Outcome Measure Help needed turning from your back to your side while in a flat bed without using bedrails?: None Help needed moving from lying on your back to sitting on the side of a flat bed without using bedrails?: None Help needed moving to and from a bed to a chair (including a wheelchair)?: A Little Help needed standing up from a chair using your arms (e.g., wheelchair or bedside chair)?: A Little Help needed to walk in hospital room?: A Little Help needed climbing 3-5 steps with a railing? : A Little 6 Click Score: 20    End of Session Equipment Utilized During Treatment: Gait belt Activity Tolerance: Treatment limited secondary to medical complications (Comment)(orthostatic) Patient left: in bed;with call bell/phone within reach;with bed alarm set;with nursing/sitter in room;with restraints reapplied Nurse Communication: Mobility status PT Visit Diagnosis: Difficulty in walking, not elsewhere classified (R26.2)    Time: 2841-3244 PT Time Calculation (min) (ACUTE ONLY): 31 min   Charges:   PT Evaluation $PT Eval Low Complexity: 1 Low PT  Treatments $Therapeutic Activity: 8-22 mins       Jannette Spanner PT, DPT Acute Rehabilitation Services Office: [phone]  Trena Platt 08/27/2019, 12:22 PM

## 2019-08-28 ENCOUNTER — Ambulatory Visit: Payer: Medicare Other

## 2019-08-28 ENCOUNTER — Inpatient Hospital Stay (HOSPITAL_COMMUNITY): Payer: Medicare Other

## 2019-08-28 DIAGNOSIS — C155 Malignant neoplasm of lower third of esophagus: Secondary | ICD-10-CM

## 2019-08-28 LAB — CBC WITH DIFFERENTIAL/PLATELET
Abs Immature Granulocytes: 0.06 10*3/uL (ref 0.00–0.07)
Basophils Absolute: 0 10*3/uL (ref 0.0–0.1)
Basophils Relative: 0 %
Eosinophils Absolute: 0.2 10*3/uL (ref 0.0–0.5)
Eosinophils Relative: 3 %
HCT: 35.5 % — ABNORMAL LOW (ref 39.0–52.0)
Hemoglobin: 12.1 g/dL — ABNORMAL LOW (ref 13.0–17.0)
Immature Granulocytes: 1 %
Lymphocytes Relative: 12 %
Lymphs Abs: 0.9 10*3/uL (ref 0.7–4.0)
MCH: 29.9 pg (ref 26.0–34.0)
MCHC: 34.1 g/dL (ref 30.0–36.0)
MCV: 87.7 fL (ref 80.0–100.0)
Monocytes Absolute: 0.5 10*3/uL (ref 0.1–1.0)
Monocytes Relative: 7 %
Neutro Abs: 5.8 10*3/uL (ref 1.7–7.7)
Neutrophils Relative %: 77 %
Platelets: 139 10*3/uL — ABNORMAL LOW (ref 150–400)
RBC: 4.05 MIL/uL — ABNORMAL LOW (ref 4.22–5.81)
RDW: 13.4 % (ref 11.5–15.5)
WBC: 7.4 10*3/uL (ref 4.0–10.5)
nRBC: 0 % (ref 0.0–0.2)

## 2019-08-28 LAB — COMPREHENSIVE METABOLIC PANEL
ALT: 29 U/L (ref 0–44)
AST: 33 U/L (ref 15–41)
Albumin: 2.9 g/dL — ABNORMAL LOW (ref 3.5–5.0)
Alkaline Phosphatase: 51 U/L (ref 38–126)
Anion gap: 10 (ref 5–15)
BUN: 9 mg/dL (ref 8–23)
CO2: 22 mmol/L (ref 22–32)
Calcium: 9.9 mg/dL (ref 8.9–10.3)
Chloride: 101 mmol/L (ref 98–111)
Creatinine, Ser: 0.98 mg/dL (ref 0.61–1.24)
GFR calc Af Amer: >60 mL/min (ref >60)
GFR calc non Af Amer: >60 mL/min (ref >60)
Glucose, Bld: 123 mg/dL — ABNORMAL HIGH (ref 70–99)
Potassium: 3.1 mmol/L — ABNORMAL LOW (ref 3.5–5.1)
Sodium: 133 mmol/L — ABNORMAL LOW (ref 135–145)
Total Bilirubin: 0.9 mg/dL (ref 0.3–1.2)
Total Protein: 5.5 g/dL — ABNORMAL LOW (ref 6.5–8.1)

## 2019-08-28 LAB — GLUCOSE, CAPILLARY
Glucose-Capillary: 114 mg/dL — ABNORMAL HIGH (ref 70–99)
Glucose-Capillary: 105 mg/dL — ABNORMAL HIGH (ref 70–99)
Glucose-Capillary: 102 mg/dL — ABNORMAL HIGH (ref 70–99)
Glucose-Capillary: 105 mg/dL — ABNORMAL HIGH (ref 70–99)
Glucose-Capillary: 96 mg/dL (ref 70–99)

## 2019-08-28 LAB — PHOSPHORUS: Phosphorus: 2.1 mg/dL — ABNORMAL LOW (ref 2.5–4.6)

## 2019-08-28 LAB — MAGNESIUM: Magnesium: 1.9 mg/dL (ref 1.7–2.4)

## 2019-08-28 MED ORDER — SODIUM CHLORIDE (PF) 0.9 % IJ SOLN
INTRAMUSCULAR | Status: AC
  Filled 2019-08-28: qty 50

## 2019-08-28 MED ORDER — IOHEXOL 300 MG/ML  SOLN
100.0000 mL | Freq: Once | INTRAMUSCULAR | Status: AC | PRN
  Administered 2019-08-28: 100 mL via INTRAVENOUS

## 2019-08-28 MED ORDER — POTASSIUM PHOSPHATES 15 MMOLE/5ML IV SOLN
20.0000 mmol | Freq: Once | INTRAVENOUS | Status: AC
  Administered 2019-08-28: 20 mmol via INTRAVENOUS
  Filled 2019-08-28: qty 6.67

## 2019-08-28 MED ORDER — IOHEXOL 9 MG/ML PO SOLN
ORAL | Status: AC
  Filled 2019-08-28: qty 1000

## 2019-08-28 MED ORDER — POTASSIUM CHLORIDE CRYS ER 20 MEQ PO TBCR
40.0000 meq | EXTENDED_RELEASE_TABLET | Freq: Once | ORAL | Status: AC
  Administered 2019-08-28: 40 meq via ORAL
  Filled 2019-08-28: qty 2

## 2019-08-28 MED ORDER — PANTOPRAZOLE SODIUM 40 MG PO PACK
40.0000 mg | PACK | Freq: Two times a day (BID) | ORAL | Status: DC
  Administered 2019-08-28 – 2019-09-10 (×25): 40 mg via ORAL
  Filled 2019-08-28 (×28): qty 20

## 2019-08-28 NOTE — Progress Notes (Signed)
PROGRESS NOTE    David [name]  RDE:[id number] DOB: [birth year] DOA: 08/25/2019 PCP: Leighton Ruff, MD   Brief Narrative:  HPI per Dr. Gean Birchwood on 08/25/19 David [name] is a 74 y.o. male with history of esophageal cancer PEG tube placement BPH dementia depression who was recently admitted for syncope on August 06, 2019 discharged on Aug 13, 2019 was observed to be increasingly weak over the last 4 to 5 days and getting more confused.  Patient also had some vomiting and patient's oncologist Dr. Learta Codding advised to stop the enteral feeds for 2 days.  And it was restarted again 2 days ago following which later in the evening patient had some vomiting patient wife also noticed some fevers around around 99 degrees.  Patient also has very frequent urination.  Did not complain of any pain or any shortness of breath.  Patient also has been having dizziness when he stands up.  Has some cough nonproductive.  Given these changes patient was brought to the ER.  ED Course: In the ER patient was alert awake oriented to his name and place moving all extremities.  CT head was unremarkable.  Labs show WBC of 10.6 hemoglobin 12.9 calcium was 10.8 sodium 131 lactic acid 0.8 EKG normal sinus rhythm.  Covid test was negative.  UA was concerning for UTI and chest x-ray shows possible infiltrates.  Cultures were obtained and patient started on antibiotics and admitted for further observation.  **Interim History  Continues to have some nausea and vomiting.  Medical oncology is consulted, and continue to hold tube feedings and diet as tolerated.  We will continue IV fluids for now.  Patient still continues to be orthostatic and repeat orthostatic vital signs are still pending.  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    -Chest x-ray findings consistent with infiltrates.  Patient admitted with cough and confusion.   -Urine culture shows no growth.  Patient still confused though better than yesterday per staff.   -On Rocephin and azithromycin. -Repeat CXR in the AM   Nausea and Vomiting  -Persistent -C/w Antiemetics -Check CT Abd/Pelvis w/ Contrast -Discussed with GI and if continues to Vomit they will formally see in the AM   Orthostatic Hypotension -Patient is profoundly orthostatic with a blood pressure of 150/80 laying down to 100/68 on standing yesterday.  -Start normal saline. -Repeat orthostatic vital signs in the a.m.  Status post PEG tube -Hold feeds now that he continues to Vomit.  Depression  -On Wellbutrin and Lexapro  Normocytic Anemia -Hgb/Hct now is 12.1/35.5 and up from 11.5/34.4 -Check Anemia Panel in the AM -Continue to Monitor for S/Sx of Bleeding; No Overt Bleeding noted -Repeat CBC in AM   Thrombocytopenia -Patient's Platelet Count is now 139,000 -Continue to Monitor and Trend; Currently no overt bleeding noted -Repeat CBC in AM   Hypokalemia -Mild at 3.1 -Replete with po KCl 40 mEQ x1 and IV K Phos 20 mmol -Continue to Monitor and Replete as Necessary -Repeat CMP in QAM  Hypophosphatemia -Patient's Phos Level is now 2.1 -Replete with IV KPhos 20 mmol -Continue to Monitor and Repelte as Necessary -Repeat Phos Level in the AM  History of Dementia  -Worsening  History of Esophageal Cancer  -followed by Dr. Benay Spice and is being seen inpatient  Goals of Care -Dr. Rodena Piety  discussed with patient's wife, has living will at home.   -CODE STATUS changed to DNR which is appropriate for the patient.   -She also expressed a desire to speak with hospice.  I have discussed with [facility] who will contact her.  DVT prophylaxis:  Code Status: DO NOT RESUSCITATE  Family Communication: Discussed with Wife via the Telephone Disposition Plan: Pending  further evaluation given his continued Nausea and Vomiting  Status is: Inpatient  Remains inpatient appropriate because:Ongoing diagnostic testing needed not appropriate for outpatient work up and IV treatments appropriate due to intensity of illness or inability to take PO   Dispo: The patient is from: Home              Anticipated d/c is to: TBD              Anticipated d/c date is: 2 days              Patient currently is not medically stable to d/c.   Consultants:   Medical Oncology   Procedures:  None   Antimicrobials:  Anti-infectives (From admission, onward)   Start     Dose/Rate Route Frequency Ordered Stop   08/27/19 0200  cefTRIAXone (ROCEPHIN) 2 g in sodium chloride 0.9 % 100 mL IVPB     2 g 200 mL/hr over 30 Minutes Intravenous Every 24 hours 08/26/19 0411 09/01/19 0159   08/26/19 0500  azithromycin (ZITHROMAX) 500 mg in sodium chloride 0.9 % 250 mL IVPB     500 mg 250 mL/hr over 60 Minutes Intravenous Every 24 hours 08/26/19 0411 08/31/19 0459   08/26/19 0115  cefTRIAXone (ROCEPHIN) 2 g in sodium chloride 0.9 % 100 mL IVPB     2 g 200 mL/hr over 30 Minutes Intravenous  Once 08/26/19 0106 08/26/19 0244     Subjective: Seen and examined at bedside he is doing okay but then started having some episodes of vomiting.  No chest pain, lightheadedness or dizziness.  No other concerns or complaints at this time.  Medical oncology recommends obtaining a GI consult but in the interim we will obtain a CT of the abdomen pelvis first  Objective: Vitals:   08/27/19 2025 08/28/19 0410 08/28/19 0416 08/28/19 1357  BP: (!) 154/76 130/80  (!) 145/79  Pulse: 86 80  61  Resp: 19 17  16   Temp: 98.2 F (36.8 C) 98.4 F (36.9 C)  98.3 F (36.8 C)  TempSrc: Oral Oral  Oral  SpO2: 99% 98%  100%  Weight:   101.1 kg   Height:        Intake/Output Summary (Last 24 hours) at 08/28/2019 1614 Last data filed at 08/28/2019 1519 Gross per 24 hour  Intake 2977.55 ml  Output 2000 ml   Net 977.55 ml   Filed Weights   08/25/19 2046 08/27/19 0500 08/28/19 0416  Weight: 97 kg 101.6 kg 101.1 kg   Examination: Physical Exam:  Constitutional: WN/WD overweight Caucasian male currently in NAD and appears calm  but does appear a little  Eyes: Lids and conuncomfortablejunctivae normal, sclerae anicteric  ENMT: External Ears, Nose appear normal. Grossly normal hearing. Mucous membranes are moist.  Neck: Appears normal, supple, no cervical masses, normal ROM, no appreciable thyromegaly; no JVD Respiratory: Diminished to auscultation bilaterally, no wheezing, rales, rhonchi or crackles. Normal respiratory effort and patient is not tachypenic. No accessory muscle use.  Unlabored breathing Cardiovascular: RRR, no murmurs / rubs / gallops. S1 and S2 auscultated. No extremity edema.  Abdomen: Soft, not really-tender, distended secondary body mass. Bowel sounds positive.  PEG tube is in place GU: Deferred. Musculoskeletal: No clubbing / cyanosis of digits/nails. No joint deformity upper and lower extremities.  Skin: No rashes, lesions, ulcers on his skin evaluation. No induration; Warm and dry.  Neurologic: CN 2-12 grossly intact with no focal deficits.  Romberg sign cerebellar reflexes not assessed.  Psychiatric: Slightly impaired judgment and insight. Normal mood and appropriate affect.   Data Reviewed: I have personally reviewed following labs and imaging studies  CBC: Recent Labs  Lab 08/22/19 1350 08/25/19 2048 08/26/19 0450 08/28/19 0924  WBC 9.7 10.6* 7.6 7.4  NEUTROABS 8.0* 8.6*  --  5.8  HGB 14.2 12.9* 11.5* 12.1*  HCT 42.3 37.5* 34.2* 35.5*  MCV 87.4 83.9 87.2 87.7  PLT 132* 173 132* 678*   Basic Metabolic Panel: Recent Labs  Lab 08/22/19 1350 08/25/19 2048 08/26/19 0450 08/28/19 0924  NA 131* 131* 135 133*  K 4.3 4.0 4.0 3.1*  CL 97* 99 103 101  CO2 20* 23 27 22   GLUCOSE 179* 110* 108* 123*  BUN 17 21 16 9   CREATININE 1.25* 1.15 1.20 0.98  CALCIUM  11.0* 10.8* 10.3 9.9  MG  --   --   --  1.9  PHOS  --   --   --  2.1*   GFR: Estimated Creatinine Clearance: 86.5 mL/min (by C-G formula based on SCr of 0.98 mg/dL). Liver Function Tests: Recent Labs  Lab 08/22/19 1350 08/25/19 2048 08/28/19 0924  AST 20 25 33  ALT 17 22 29   ALKPHOS 66 53 51  BILITOT 1.0 1.5* 0.9  PROT 6.1* 6.2* 5.5*  ALBUMIN 3.5 3.3* 2.9*   No results for input(s): LIPASE, AMYLASE in the last 168 hours. No results for input(s): AMMONIA in the last 168 hours. Coagulation Profile: No results for input(s): INR, PROTIME in the last 168 hours. Cardiac Enzymes: No results for input(s): CKTOTAL, CKMB, CKMBINDEX, TROPONINI in the last 168 hours. BNP (last 3 results) No results for input(s): PROBNP in the last 8760 hours. HbA1C: No results for input(s): HGBA1C in the last 72 hours. CBG: Recent Labs  Lab 08/27/19 1942 08/27/19 2330 08/28/19 0411 08/28/19 0743 08/28/19 1115  GLUCAP 127* 86 114* 105* 102*   Lipid Profile: No results for input(s): CHOL, HDL, LDLCALC, TRIG, CHOLHDL, LDLDIRECT in the last 72 hours. Thyroid Function Tests: No results for input(s): TSH, T4TOTAL, FREET4, T3FREE, THYROIDAB in the last 72 hours. Anemia Panel: No results for input(s): VITAMINB12, FOLATE, FERRITIN, TIBC, IRON, RETICCTPCT in the last 72 hours. Sepsis Labs: Recent Labs  Lab 08/25/19 2355  LATICACIDVEN 0.8    Recent Results (from the past 240 hour(s))  Blood culture (routine x 2)     Status: None (Preliminary result)   Collection Time: 08/25/19 11:00 PM   Specimen: BLOOD  Result Value Ref Range Status   Specimen Description   Final    BLOOD LEFT ANTECUBITAL Performed at [facility] [address] [place], [place] [address]    Special Requests   Final    BOTTLES DRAWN AEROBIC AND ANAEROBIC Blood Culture adequate volume Performed at [facility] [address], [place], [place] [address]    Culture   Final    NO GROWTH  2 DAYS Performed at [facility] [address], [place], [place] [address]    Report Status PENDING  Incomplete  Blood culture (routine x 2)  Status: None (Preliminary result)   Collection Time: 08/25/19 11:04 PM   Specimen: BLOOD  Result Value Ref Range Status   Specimen Description   Final    BLOOD LEFT HAND Performed at [facility] [address], [place], [place] [address]    Special Requests   Final    BOTTLES DRAWN AEROBIC AND ANAEROBIC Blood Culture adequate volume Performed at [facility] [address] [place], [place] [address]    Culture   Final    NO GROWTH 2 DAYS Performed at [facility] [address] [place], [place] [address]    Report Status PENDING  Incomplete  Urine Culture     Status: Abnormal   Collection Time: 08/26/19 12:58 AM   Specimen: Urine, Random  Result Value Ref Range Status   Specimen Description   Final    URINE, RANDOM Performed at [facility] [address] [place], [place] [address]    Special Requests   Final    NONE Performed at [facility], [place] [address], [place], [place] [address]    Culture (A)  Final    <10,000 COLONIES/mL INSIGNIFICANT GROWTH Performed at [facility] [address], [place], [place] [address]    Report Status 08/27/2019 FINAL  Final  SARS Coronavirus 2 by RT PCR (hospital order, performed in [facility] hospital lab) Nasopharyngeal Nasopharyngeal Swab     Status: None   Collection Time: 08/26/19  1:06 AM   Specimen: Nasopharyngeal Swab  Result Value Ref Range Status   SARS Coronavirus 2 NEGATIVE NEGATIVE Final    Comment: (NOTE) SARS-CoV-2 target nucleic acids are NOT DETECTED. The SARS-CoV-2 RNA is generally detectable in upper and lower respiratory specimens during the acute phase of infection. The lowest concentration of SARS-CoV-2 viral copies this assay can detect is 250 copies / mL. A  negative result does not preclude SARS-CoV-2 infection and should not be used as the sole basis for treatment or other patient management decisions.  A negative result may occur with improper specimen collection / handling, submission of specimen other than nasopharyngeal swab, presence of viral mutation(s) within the areas targeted by this assay, and inadequate number of viral copies (<250 copies / mL). A negative result must be combined with clinical observations, patient history, and epidemiological information. Fact Sheet for Patients:   StrictlyIdeas.no Fact Sheet for Healthcare Providers: BankingDealers.co.za This test is not yet approved or cleared  by the [place] FDA and has been authorized for detection and/or diagnosis of SARS-CoV-2 by FDA under an Emergency Use Authorization (EUA).  This EUA will remain in effect (meaning this test can be used) for the duration of the COVID-19 declaration under Section 564(b)(1) of the Act, 21 U.S.C. section 360bbb-3(b)(1), unless the authorization is terminated or revoked sooner. Performed at [facility], [place] [address] [place], [place] [address]     RN Pressure Injury Documentation:     Estimated body mass index is 28.62 kg/m as calculated from the following:   Height as of this encounter: 6\' 2"  (1.88 m).   Weight as of this encounter: 101.1 kg.  Malnutrition Type:  Nutrition Problem: Increased nutrient needs Etiology: cancer and cancer related treatments   Malnutrition Characteristics:  Signs/Symptoms: estimated needs   Nutrition Interventions:  Interventions: Tube feeding   Radiology Studies: CT HEAD WO CONTRAST  Result Date: 08/26/2019 CLINICAL DATA:  Facial trauma, fall EXAM: CT HEAD WITHOUT CONTRAST TECHNIQUE: Contiguous  axial images were obtained from the base of the skull through the vertex without intravenous contrast. COMPARISON:  08/25/2019  FINDINGS: Brain: There is atrophy and chronic small vessel disease changes. No acute intracranial abnormality. Specifically, no hemorrhage, hydrocephalus, mass lesion, acute infarction, or significant intracranial injury. Vascular: No hyperdense vessel or unexpected calcification. Skull: No acute calvarial abnormality. Sinuses/Orbits: Visualized paranasal sinuses and mastoids clear. Orbital soft tissues unremarkable. Other: None IMPRESSION: Atrophy, chronic microvascular disease. No acute intracranial abnormality. Electronically Signed   By: Rolm Baptise M.D.   On: 08/26/2019 18:31   Scheduled Meds: . buPROPion  150 mg Oral Daily  . enoxaparin (LOVENOX) injection  40 mg Subcutaneous Q24H  . escitalopram  20 mg Oral Daily  . fludrocortisone  0.1 mg Oral Daily  . midodrine  10 mg Oral TID WC  . pantoprazole sodium  40 mg Oral Daily  . simvastatin  40 mg Oral Daily  . tamsulosin  0.4 mg Oral QHS  . vitamin B-12  1,000 mcg Oral Daily   Continuous Infusions: . sodium chloride 100 mL/hr at 08/27/19 1321  . azithromycin 500 mg (08/28/19 0451)  . cefTRIAXone (ROCEPHIN)  IV 2 g (08/28/19 0205)  . potassium PHOSPHATE IVPB (in mmol) 20 mmol (08/28/19 1519)    LOS: 2 days   Kerney Elbe, DO [facility] PAGER is on [place]  If 7PM-7AM, please contact night-coverage [email or web address]

## 2019-08-28 NOTE — Progress Notes (Signed)
Nutrition Follow-up  INTERVENTION:   Will monitor for plan following GI consult  NUTRITION DIAGNOSIS:   Increased nutrient needs related to cancer and cancer related treatments as evidenced by estimated needs.  Ongoing.  GOAL:   Patient will meet greater than or equal to 90% of their needs  Not meeting.  MONITOR:   TF tolerance, I & O's, Labs, Weight trends  ASSESSMENT:   74 y.o. male with history of esophageal cancer PEG tube placement BPH dementia depression who was recently admitted for syncope on August 06, 2019 discharged on Aug 13, 2019 was observed to be increasingly weak over the last 4 to 5 days and getting more confused.  Patient also had some vomiting and patient's oncologist Dr. Learta Codding advised to stop the enteral feeds for 2 days.  And it was restarted again 2 days ago following which later in the evening patient had some vomiting patient wife also noticed some fevers around around 99 degrees.  Patient with multiple episodes of vomiting this morning following breakfast. Spoke with bedside sitter, states emesis was a whitish/gray color, states she did not think it looked like tube feeding or food that he had just consumed. Pt had french toast and bacon.  Per oncology note, tube feeding on hold and recommends GI consult.  Will monitor for plan going forward and provide recommendations at that time.  Admission weight: 213 lbs. Current weight: 223 lbs.  I/Os: +3.2L since admit UOP: 1850 ml x 24 hrs  Medications: Vitamin B-12, K-Phos, IV Zofran Labs reviewed:  CBGs: 102-105 Low Na, K, Phos  Diet Order:   Diet Order            Diet regular Room service appropriate? Yes; Fluid consistency: Thin  Diet effective now              EDUCATION NEEDS:   No education needs have been identified at this time  Skin:  Skin Assessment: Reviewed RN Assessment  Last BM:  5/19- type 7  Height:   Ht Readings from Last 1 Encounters:  08/25/19 6\' 2"  (1.88 m)     Weight:   Wt Readings from Last 1 Encounters:  08/28/19 101.1 kg    Ideal Body Weight:     BMI:  Body mass index is 28.62 kg/m.  Estimated Nutritional Needs:   Kcal:  2300-2500  Protein:  115-130g  Fluid:  2.3L/day  Clayton Bibles, MS, RD, LDN Inpatient Clinical Dietitian Contact information available via Amion

## 2019-08-28 NOTE — NC FL2 (Addendum)
[place] LEVEL OF CARE SCREENING TOOL     IDENTIFICATION  Patient Name: David [name] Birthdate: January 13, 1946 Sex: male Admission Date (Current Location): 08/25/2019  [facility] and [place] Number:  Herbalist and Address:  [facility],  [place], [place]      Provider Number: [id number]  Attending Physician Name and Address:  Kerney Elbe, DO  Relative Name and Phone Number:  [name] Tejada, wife,[phone]    Current Level of Care: Hospital Recommended Level of Care: [place] Prior Approval Number:    Date Approved/Denied:   PASRR Number: [id number]  Discharge Plan: SNF    Current Diagnoses: Patient Active Problem List   Diagnosis Date Noted  . Acute encephalopathy 08/26/2019  . Orthostasis 08/07/2019  . Atypical syncope 08/06/2019  . Syncope and collapse 08/06/2019  . Goals of care, counseling/discussion 07/30/2019  . Hematemesis 07/21/2019  . Dysphagia 07/05/2019  . Dementia without behavioral disturbance ([place]) 07/05/2019  . AKI (acute kidney injury) ([place]) 07/05/2019  . Esophageal cancer ([place]) 07/05/2019  . Primary cancer of lower third of esophagus ([place]) 07/04/2019  . Major depressive disorder, recurrent episode, severe ([place]) 10/28/2017  . Generalized anxiety disorder 10/28/2017  . Dyslipidemia 07/10/2012  . Metabolic syndrome 71/69/6789  . Prostatic hypertrophy 07/10/2012  . Testosterone deficiency 07/10/2012  . Essential hypertension 06/27/2011  . MDD (major depressive disorder), recurrent episode, mild ([place]) 05/16/2011    Orientation RESPIRATION BLADDER Height & Weight     Self, Place  Normal External catheter Weight: 101.1 kg Height:  6\' 2"  (188 cm)  BEHAVIORAL SYMPTOMS/MOOD NEUROLOGICAL BOWEL NUTRITION STATUS  (none) (none) Continent Diet(see d/c summary)  AMBULATORY STATUS COMMUNICATION OF NEEDS Skin   Extensive Assist Verbally Normal                        Personal Care Assistance Level of Assistance  Bathing, Feeding, Dressing Bathing Assistance: Limited assistance Feeding assistance: Maximum assistance(PEG tube) Dressing Assistance: Limited assistance     Functional Limitations Info  Sight Sight Info: Adequate Hearing Info: Adequate Speech Info: Adequate    SPECIAL CARE FACTORS FREQUENCY  PT (By licensed PT), OT (By licensed OT)     PT Frequency: 5X/W OT Frequency: 5X/W            Contractures Contractures Info: Not present    Additional Factors Info  Code Status, Allergies Code Status Info: DNR Allergies Info: Abilify, Lamictal           Current Medications (08/28/2019):  This is the current hospital active medication list Current Facility-Administered Medications  Medication Dose Route Frequency Provider Last Rate Last Admin  . 0.9 %  sodium chloride infusion   Intravenous Continuous Georgette Shell, MD 100 mL/hr at 08/27/19 1321 New Bag at 08/27/19 1321  . azithromycin (ZITHROMAX) 500 mg in sodium chloride 0.9 % 250 mL IVPB  500 mg Intravenous Q24H Rise Patience, MD 250 mL/hr at 08/28/19 0451 500 mg at 08/28/19 0451  . buPROPion (WELLBUTRIN XL) 24 hr tablet 150 mg  150 mg Oral Daily Rise Patience, MD   150 mg at 08/28/19 1020  . cefTRIAXone (ROCEPHIN) 2 g in sodium chloride 0.9 % 100 mL IVPB  2 g Intravenous Q24H Rise Patience, MD 200 mL/hr at 08/28/19 0205 2 g at 08/28/19 0205  . enoxaparin (LOVENOX) injection 40 mg  40 mg Subcutaneous Q24H Rise Patience, MD   40 mg at 08/28/19  [id number]  . escitalopram (LEXAPRO) tablet 20 mg  20 mg Oral Daily Rise Patience, MD   20 mg at 08/28/19 1020  . fludrocortisone (FLORINEF) tablet 0.1 mg  0.1 mg Oral Daily Georgette Shell, MD   0.1 mg at 08/28/19 1019  . LORazepam (ATIVAN) tablet 0.5 mg  0.5 mg Oral Q6H PRN Rise Patience, MD   0.5 mg at 08/27/19 0020  . midodrine (PROAMATINE) tablet 10 mg  10 mg Oral TID WC Rise Patience, MD   10 mg at 08/28/19 0946  . ondansetron (ZOFRAN) tablet 4 mg  4 mg Oral Q6H PRN Rise Patience, MD       Or  . ondansetron [facility]) injection 4 mg  4 mg Intravenous Q6H PRN Rise Patience, MD   4 mg at 08/28/19 0843  . pantoprazole sodium (PROTONIX) 40 mg/20 mL oral suspension 40 mg  40 mg Oral Daily Rise Patience, MD   40 mg at 08/28/19 1020  . potassium PHOSPHATE 20 mmol in dextrose 5 % 500 mL infusion  20 mmol Intravenous Once Sheikh, Omair Latif, DO      . simvastatin (ZOCOR) tablet 40 mg  40 mg Oral Daily Rise Patience, MD   40 mg at 08/28/19 1019  . tamsulosin (FLOMAX) capsule 0.4 mg  0.4 mg Oral QHS Rise Patience, MD   0.4 mg at 08/27/19 2132  . vitamin B-12 (CYANOCOBALAMIN) tablet 1,000 mcg  1,000 mcg Oral Daily Rise Patience, MD   1,000 mcg at 08/28/19 1019     Discharge Medications: Please see discharge summary for a list of discharge medications.  Relevant Imaging Results:  Relevant Lab Results:   Additional Information [id number]  Trish Mage, LCSW

## 2019-08-28 NOTE — Progress Notes (Signed)
HEMATOLOGY-ONCOLOGY PROGRESS NOTE  SUBJECTIVE: He is alert this morning.  No pain, nausea, or dysphagia.  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     PHYSICAL EXAMINATION:  Vitals:   08/28/19 0410 08/28/19 1357  BP: 130/80 (!) 145/79  Pulse: 80 61  Resp: 17 16  Temp: 98.4 F (36.9 C) 98.3 F (36.8 C)  SpO2: 98% 100%   Filed Weights   08/25/19 2046 08/27/19 0500 08/28/19 0416  Weight: 213 lb 13.5 oz (97 kg) 223 lb 15.8 oz (101.6 kg) 222 lb 14.2 oz (101.1 kg)    Intake/Output from previous day: 05/18 0701 - 05/19 0700 In: 984.6 [P.O.:720; I.V.:264.6] Out: 2225 [Urine:2225]  ABDOMEN:  soft, nontender, left upper abdomen feeding tube in place  NEURO: alert and follows commands  LABORATORY DATA:  I have reviewed the data as listed CMP Latest Ref Rng & Units 08/28/2019 08/26/2019 08/25/2019  Glucose 70 - 99 mg/dL 123(H) 108(H) 110(H)  BUN 8 - 23 mg/dL _0 Creatinine 0.61 - 1.24 mg/dL 0.98 1.20 1.15  Sodium 135 - 145 mmol/L 133(L) 135 131(L)  Potassium 3.5 - 5.1 mmol/L 3.1(L) 4.0 4.0  Chloride 98 - 111 mmol/L 101 103 99  CO2 22 - 32 mmol/L _1 Calcium 8.9 - 10.3 mg/dL 9.9 10.3 10.8(H)  Total Protein 6.5 - 8.1 g/dL 5.5(L) - 6.2(L)  Total Bilirubin 0.3 - 1.2 mg/dL 0.9 - 1.5(H)  Alkaline Phos 38 - 126 U/L 51 - 53  AST 15 - 41 U/L 33 - 25  ALT 0 - 44 U/L 29 - 22    Lab Results  Component Value Date   WBC 7.4 08/28/2019   HGB 12.1 (L) 08/28/2019   HCT 35.5 (L) 08/28/2019   MCV 87.7 08/28/2019   PLT 139 (L) 08/28/2019   NEUTROABS 5.8 08/28/2019    DG Chest 2 View  Result Date: 08/25/2019 CLINICAL DATA:  Fever. EXAM: CHEST -  2 VIEW COMPARISON:  Chest radiograph 05/06/2019. PET CT 07/05/2019 FINDINGS: Upper normal heart size. Aortic atherosclerosis. Right paratracheal soft tissue density corresponds to adenopathy on PET. Streaky right infrahilar opacities. No focal airspace disease, pleural effusion, or pneumothorax. No acute osseous abnormalities are seen. IMPRESSION: 1. Streaky right infrahilar opacities may be atelectasis or potentially aspiration. 2. Right paratracheal adenopathy. Electronically Signed   By: Keith Rake M.D.   On: 08/25/2019 23:19   CT HEAD WO CONTRAST  Result Date: 08/26/2019 CLINICAL DATA:  Facial trauma, fall EXAM: CT HEAD WITHOUT CONTRAST TECHNIQUE: Contiguous axial images were obtained from the base of the skull through the vertex without intravenous contrast. COMPARISON:  08/25/2019 FINDINGS: Brain: There is atrophy and chronic small vessel disease changes. No acute intracranial abnormality. Specifically, no hemorrhage, hydrocephalus, mass lesion, acute infarction, or significant intracranial injury. Vascular: No hyperdense vessel or unexpected calcification. Skull: No acute calvarial abnormality. Sinuses/Orbits: Visualized paranasal sinuses and mastoids clear. Orbital soft tissues unremarkable. Other: None IMPRESSION: Atrophy, chronic microvascular disease. No acute intracranial abnormality. Electronically Signed   By: Rolm Baptise M.D.   On: 08/26/2019 18:31   CT Head Wo Contrast  Result Date: 08/25/2019 CLINICAL DATA:  Encephalopathy, nausea,  and dizziness EXAM: CT HEAD WITHOUT CONTRAST TECHNIQUE: Contiguous axial images were obtained from the base of the skull through the vertex without intravenous contrast. COMPARISON:  CT 08/06/2019 FINDINGS: Brain: No evidence of acute infarction, hemorrhage, hydrocephalus, extra-axial collection or mass lesion/mass effect. Symmetric prominence of the ventricles, cisterns and sulci compatible with parenchymal volume loss. Confluent and more patchy areas of  white matter hypoattenuation are most compatible with chronic microvascular angiopathy. Vascular: Atherosclerotic calcification of the carotid siphons and intradural vertebral arteries. No hyperdense vessel. Skull: No calvarial fracture or suspicious osseous lesion. No scalp swelling or hematoma. Sinuses/Orbits: Paranasal sinuses and mastoid air cells are predominantly clear. Pneumatization of the petrous apices. Orbital structures are unremarkable aside from prior lens extractions. Other: None IMPRESSION: 1. No acute intracranial abnormality. 2. Chronic stable parenchymal volume loss and chronic microvascular angiopathy. Electronically Signed   By: Lovena Le M.D.   On: 08/25/2019 23:32   CT HEAD WO CONTRAST  Result Date: 08/06/2019 CLINICAL DATA:  Recurrent syncope EXAM: CT HEAD WITHOUT CONTRAST TECHNIQUE: Contiguous axial images were obtained from the base of the skull through the vertex without intravenous contrast. COMPARISON:  05/06/2019 FINDINGS: Brain: There is no mass, hemorrhage or extra-axial collection. The appearance of the white matter is normal for the patient's age. There is generalized atrophy. Vascular: No abnormal hyperdensity of the major intracranial arteries or dural venous sinuses. No intracranial atherosclerosis. Skull: The visualized skull base, calvarium and extracranial soft tissues are normal. Sinuses/Orbits: No fluid levels or advanced mucosal thickening of the visualized paranasal sinuses. No mastoid or middle ear effusion. The orbits are normal. IMPRESSION: Generalized atrophy without acute intracranial abnormality. Electronically Signed   By: Ulyses Jarred M.D.   On: 08/06/2019 21:42   MR BRAIN WO CONTRAST  Result Date: 08/09/2019 CLINICAL DATA:  Dizziness, dehydration or hypotension. Additional history provided: Medical history significant for esophageal cancer on palliative chemotherapy, hypertension, dementia, hyperlipidemia, BPH, anxiety disorder presenting with  intermittent dizziness. EXAM: MRI HEAD WITHOUT CONTRAST TECHNIQUE: Multiplanar, multiecho pulse sequences of the brain and surrounding structures were obtained without intravenous contrast. COMPARISON:  Noncontrast head CT 08/06/2019, brain MRI 10/28/2017. FINDINGS: Brain: Please note evaluation for intracranial metastatic disease is limited on this noncontrast study. There are few scattered punctate foci of T2/FLAIR hyperintensity within the cerebral white matter, unchanged and nonspecific. Stable moderate correct atrophy with somewhat disproportionate involvement of the medial temporal lobes. There is no acute infarct. No evidence of intracranial mass. No chronic intracranial blood products. No extra-axial fluid collection. No midline shift. Vascular: Expected proximal arterial flow voids. Skull and upper cervical spine: No focal marrow lesion. Sinuses/Orbits: Visualized orbits show no acute finding. Minimal ethmoid sinus mucosal thickening. No significant mastoid effusion. IMPRESSION: 1. Please note evaluation for intracranial metastatic disease is limited on this non-contrast exam. 2. No evidence of acute intracranial abnormality. 3. Stable moderate parenchymal atrophy with somewhat disproportionate involvement of the medial temporal lobes. 4. A few scattered punctate T2 hyperintense signal changes within the cerebral white matter are unchanged and nonspecific. 5. Minimal ethmoid sinus mucosal thickening. Electronically Signed   By: Kellie Simmering DO   On: 08/09/2019 15:38   DG ABDOMEN PEG TUBE LOCATION  Result Date: 08/05/2019 CLINICAL DATA:  Peg tube evaluation. EXAM: ABDOMEN - 1 VIEW COMPARISON:  IR gastrostomy placement 07/08/2019 FINDINGS: A single frontal view of the abdomen is provided. A PEG tube balloon is seen in the left upper quadrant projecting over the stomach. There is contrast within the stomach and proximal duodenum.  No evidence of extraluminal contrast. The bowel gas pattern is  nonobstructive. IMPRESSION: PEG tube balloon projects over the stomach. Contrast is seen within the stomach and proximal duodenum. No evidence of extraluminal contrast. Electronically Signed   By: Audie Pinto M.D.   On: 08/05/2019 18:06    ASSESSMENT AND PLAN: 1. Esophagus cancer ? CT 05/06/2019-circumferential thickening of the distal esophagus, mild splenomegaly, bladder wall thickening, enlarged prostate ? Upper endoscopy 06/11/2019-ulcerated mass at the lower esophagus, 36 cm from the incisors, partially obstructing, biopsy revealed atypical cells-reactive change versus poorly differentiated neoplasm, outside consultation at [facility] on this biopsy and a repeat biopsy 06/21/2019 confirmed a poorly differentiated malignant neoplasm ? HER-2 negative, PD-L1 combined positive score -20 ? Repeat endoscopy 06/21/2019-partially obstructing esophageal tumor found in the lower third of the esophagus ? PET scan 07/05/2019-large esophageal mass with nodal involvement both above and below the diaphragm including a left periaortic node ? Palliative radiation to the esophagus mass starting 07/22/2019, completed 08/09/2019 ? Cycle 1 pembrolizumab 08/15/2019 2. Dementia 3. Anxiety/depression 4. Sleep apnea 5. BPH 6. Dysphagia secondary to #1  Gastrostomy tube placement 07/08/2019 7. Hyperlipidemia 8. Admission with hematemesis 07/21/2019 9. Admission for dehydration and pre-syncope in clinic 08/06/19 10. Recurrent nausea and vomiting-etiology unclear   Mr. Kasparian appeared unchanged when I saw him early this morning.  I was contacted by the floor are in later this morning with a report that he had multiple episodes of emesis.  The etiology of his nausea and vomiting is unclear.  This may be related to tube feedings or another etiology.  The nausea predated pembrolizumab treatment.  I recommend holding the tube feedings and considering a GI evaluation.    Recommendations: 1.  Hold tube feedings 2.   Diet as tolerated, continue IV fluids 3.  Continue Florinef, midodrine, and support stockings for the orthostasis, repeat orthostatic vital signs tomorrow 4.  Plan for cycle 2 pembrolizumab next week 5.  GI consult for persistent vomiting   LOS: 2 days   Betsy Coder, MD 08/28/19

## 2019-08-28 NOTE — Progress Notes (Signed)
RN notified pt vomiting. Pt has had 5 episode of vomiting, whitish color emesis since breakfast. Zofran administered. Oncologist notified. Will continue to monitor.

## 2019-08-28 NOTE — TOC Progression Note (Signed)
Transition of Care [facility]) - Progression Note    Patient Details  Name: David [name] MRN: [id number] Date of Birth: 02/25/1946  Transition of Care [facility] CM/SW [place], [place] Phone Number: 08/28/2019, 4:40 PM  Clinical Narrative:   Felipa Evener back from insurance who is ready to authorize SNF, but not willing to do so until they have destination identified.  Called wife to go over bed offers.  She said no to [place], wants to investigate the other 2 further.  Stated she would be prepared to make a decision in the AM.  Please call Kim at [facility] at [phone] Option 3 with decision. TOC will continue to follow during the course of hospitalization.     Expected Discharge Plan: Skilled Nursing Facility Barriers to Discharge: Other (comment)(SNF pending wife's decision)  Expected Discharge Plan and Services Expected Discharge Plan: [place]                                               Social Determinants of Health (SDOH) Interventions    Readmission Risk Interventions No flowsheet data found.

## 2019-08-29 ENCOUNTER — Inpatient Hospital Stay (HOSPITAL_COMMUNITY): Payer: Medicare Other

## 2019-08-29 LAB — COMPREHENSIVE METABOLIC PANEL
ALT: 42 U/L (ref 0–44)
AST: 42 U/L — ABNORMAL HIGH (ref 15–41)
Albumin: 2.8 g/dL — ABNORMAL LOW (ref 3.5–5.0)
Alkaline Phosphatase: 52 U/L (ref 38–126)
Anion gap: 8 (ref 5–15)
BUN: 6 mg/dL — ABNORMAL LOW (ref 8–23)
CO2: 25 mmol/L (ref 22–32)
Calcium: 9.9 mg/dL (ref 8.9–10.3)
Chloride: 98 mmol/L (ref 98–111)
Creatinine, Ser: 0.85 mg/dL (ref 0.61–1.24)
GFR calc Af Amer: >60 mL/min (ref >60)
GFR calc non Af Amer: >60 mL/min (ref >60)
Glucose, Bld: 113 mg/dL — ABNORMAL HIGH (ref 70–99)
Potassium: 3.1 mmol/L — ABNORMAL LOW (ref 3.5–5.1)
Sodium: 131 mmol/L — ABNORMAL LOW (ref 135–145)
Total Bilirubin: 0.6 mg/dL (ref 0.3–1.2)
Total Protein: 5.3 g/dL — ABNORMAL LOW (ref 6.5–8.1)

## 2019-08-29 LAB — CBC WITH DIFFERENTIAL/PLATELET
Abs Immature Granulocytes: 0.07 10*3/uL (ref 0.00–0.07)
Basophils Absolute: 0 10*3/uL (ref 0.0–0.1)
Basophils Relative: 1 %
Eosinophils Absolute: 0.2 10*3/uL (ref 0.0–0.5)
Eosinophils Relative: 3 %
HCT: 33.6 % — ABNORMAL LOW (ref 39.0–52.0)
Hemoglobin: 11.1 g/dL — ABNORMAL LOW (ref 13.0–17.0)
Immature Granulocytes: 1 %
Lymphocytes Relative: 10 %
Lymphs Abs: 0.7 10*3/uL (ref 0.7–4.0)
MCH: 29.1 pg (ref 26.0–34.0)
MCHC: 33 g/dL (ref 30.0–36.0)
MCV: 88 fL (ref 80.0–100.0)
Monocytes Absolute: 0.5 10*3/uL (ref 0.1–1.0)
Monocytes Relative: 8 %
Neutro Abs: 5.1 10*3/uL (ref 1.7–7.7)
Neutrophils Relative %: 77 %
Platelets: 140 10*3/uL — ABNORMAL LOW (ref 150–400)
RBC: 3.82 MIL/uL — ABNORMAL LOW (ref 4.22–5.81)
RDW: 13.4 % (ref 11.5–15.5)
WBC: 6.6 10*3/uL (ref 4.0–10.5)
nRBC: 0 % (ref 0.0–0.2)

## 2019-08-29 LAB — PHOSPHORUS: Phosphorus: 3.1 mg/dL (ref 2.5–4.6)

## 2019-08-29 LAB — GLUCOSE, CAPILLARY
Glucose-Capillary: 104 mg/dL — ABNORMAL HIGH (ref 70–99)
Glucose-Capillary: 105 mg/dL — ABNORMAL HIGH (ref 70–99)
Glucose-Capillary: 107 mg/dL — ABNORMAL HIGH (ref 70–99)
Glucose-Capillary: 109 mg/dL — ABNORMAL HIGH (ref 70–99)
Glucose-Capillary: 95 mg/dL (ref 70–99)
Glucose-Capillary: 96 mg/dL (ref 70–99)
Glucose-Capillary: 99 mg/dL (ref 70–99)

## 2019-08-29 LAB — LEGIONELLA PNEUMOPHILA SEROGP 1 UR AG: L. pneumophila Serogp 1 Ur Ag: NEGATIVE

## 2019-08-29 LAB — MAGNESIUM: Magnesium: 1.8 mg/dL (ref 1.7–2.4)

## 2019-08-29 MED ORDER — SODIUM CHLORIDE 0.9 % IV BOLUS
1000.0000 mL | Freq: Once | INTRAVENOUS | Status: AC
  Administered 2019-08-29: 1000 mL via INTRAVENOUS

## 2019-08-29 MED ORDER — POTASSIUM CHLORIDE CRYS ER 20 MEQ PO TBCR
40.0000 meq | EXTENDED_RELEASE_TABLET | Freq: Two times a day (BID) | ORAL | Status: AC
  Administered 2019-08-29 (×2): 40 meq via ORAL
  Filled 2019-08-29 (×2): qty 2

## 2019-08-29 MED ORDER — SACCHAROMYCES BOULARDII 250 MG PO CAPS
250.0000 mg | ORAL_CAPSULE | Freq: Two times a day (BID) | ORAL | Status: DC
  Administered 2019-08-29 – 2019-09-10 (×26): 250 mg via ORAL
  Filled 2019-08-29 (×26): qty 1

## 2019-08-29 MED ORDER — SODIUM CHLORIDE 0.9 % IV SOLN: INTRAVENOUS | Status: AC

## 2019-08-29 MED ORDER — MAGNESIUM SULFATE 2 GM/50ML IV SOLN
2.0000 g | Freq: Once | INTRAVENOUS | Status: AC
  Administered 2019-08-29: 2 g via INTRAVENOUS
  Filled 2019-08-29: qty 50

## 2019-08-29 NOTE — Progress Notes (Addendum)
HEMATOLOGY-ONCOLOGY PROGRESS NOTE  SUBJECTIVE: He is alert this morning.  Has some pain in his left arm at IV infiltration site.  Had some diarrhea overnight and GI panel has been ordered.  Has been placed on contact precautions.  No pain, nausea, or dysphagia.  Sitter is at the bedside.  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     PHYSICAL EXAMINATION:  Vitals:   08/28/19 2113 08/29/19 0623  BP: 132/78 129/71  Pulse: 62 75  Resp: 18 17  Temp: 99.1 F (37.3 C) 99 F (37.2 C)  SpO2: 100% 100%   Filed Weights   08/27/19 0500 08/28/19 0416 08/29/19 0623  Weight: 101.6 kg 101.1 kg 100.3 kg    Intake/Output from previous day: 05/19 0701 - 05/20 0700 In: 2835.6 [P.O.:238; I.V.:2147.6; IV Piggyback:450] Out: 1400 [Urine:1400]  ABDOMEN:  soft, nontender, left upper abdomen feeding tube in place  NEURO: alert and follows commands  LABORATORY DATA:  I have reviewed the data as listed CMP Latest Ref Rng & Units 08/29/2019 08/28/2019 08/26/2019  Glucose 70 - 99 mg/dL 113(H) 123(H) 108(H)  BUN 8 - 23 mg/dL 6(L) 9 16  Creatinine 0.61 - 1.24 mg/dL 0.85 0.98 1.20  Sodium 135 - 145 mmol/L 131(L) 133(L) 135  Potassium 3.5 - 5.1 mmol/L 3.1(L) 3.1(L) 4.0  Chloride 98 - 111 mmol/L 98 101 103  CO2 22 - 32 mmol/L _0 Calcium 8.9 - 10.3 mg/dL 9.9 9.9 10.3  Total Protein 6.5 - 8.1 g/dL 5.3(L) 5.5(L) -  Total Bilirubin 0.3 - 1.2 mg/dL 0.6 0.9 -  Alkaline Phos 38 - 126 U/L 52 51 -  AST 15 - 41 U/L 42(H) 33 -  ALT 0 - 44 U/L 42 29 -    Lab Results  Component Value Date   WBC 6.6 08/29/2019   HGB 11.1 (L) 08/29/2019   HCT 33.6 (L) 08/29/2019   MCV  88.0 08/29/2019   PLT 140 (L) 08/29/2019   NEUTROABS 5.1 08/29/2019    DG Chest 2 View  Result Date: 08/25/2019 CLINICAL DATA:  Fever. EXAM: CHEST - 2 VIEW COMPARISON:  Chest radiograph 05/06/2019. PET CT 07/05/2019 FINDINGS: Upper normal heart size. Aortic atherosclerosis. Right paratracheal soft tissue density corresponds to adenopathy on PET. Streaky right infrahilar opacities. No focal airspace disease, pleural effusion, or pneumothorax. No acute osseous abnormalities are seen. IMPRESSION: 1. Streaky right infrahilar opacities may be atelectasis or potentially aspiration. 2. Right paratracheal adenopathy. Electronically Signed   By: Keith Rake M.D.   On: 08/25/2019 23:19   DG Abd 1 View  Result Date: 08/29/2019 CLINICAL DATA:  Intractable nausea and vomiting. History of esophageal cancer. EXAM: ABDOMEN - 1 VIEW COMPARISON:  08/05/2019 abdominal film and CT scan 08/28/2019 FINDINGS: There is scattered contrast in the right colon and rectum. No findings for obstruction or perforation. IMPRESSION: No findings for bowel obstruction. Electronically Signed   By: Marijo Sanes M.D.   On: 08/29/2019 08:05   CT HEAD WO CONTRAST  Result Date: 08/26/2019 CLINICAL DATA:  Facial trauma, fall EXAM: CT HEAD WITHOUT CONTRAST TECHNIQUE: Contiguous axial images were obtained from the base of the skull through the  vertex without intravenous contrast. COMPARISON:  08/25/2019 FINDINGS: Brain: There is atrophy and chronic small vessel disease changes. No acute intracranial abnormality. Specifically, no hemorrhage, hydrocephalus, mass lesion, acute infarction, or significant intracranial injury. Vascular: No hyperdense vessel or unexpected calcification. Skull: No acute calvarial abnormality. Sinuses/Orbits: Visualized paranasal sinuses and mastoids clear. Orbital soft tissues unremarkable. Other: None IMPRESSION: Atrophy, chronic microvascular disease. No acute intracranial abnormality. Electronically Signed    By: Rolm Baptise M.D.   On: 08/26/2019 18:31   CT Head Wo Contrast  Result Date: 08/25/2019 CLINICAL DATA:  Encephalopathy, nausea, and dizziness EXAM: CT HEAD WITHOUT CONTRAST TECHNIQUE: Contiguous axial images were obtained from the base of the skull through the vertex without intravenous contrast. COMPARISON:  CT 08/06/2019 FINDINGS: Brain: No evidence of acute infarction, hemorrhage, hydrocephalus, extra-axial collection or mass lesion/mass effect. Symmetric prominence of the ventricles, cisterns and sulci compatible with parenchymal volume loss. Confluent and more patchy areas of white matter hypoattenuation are most compatible with chronic microvascular angiopathy. Vascular: Atherosclerotic calcification of the carotid siphons and intradural vertebral arteries. No hyperdense vessel. Skull: No calvarial fracture or suspicious osseous lesion. No scalp swelling or hematoma. Sinuses/Orbits: Paranasal sinuses and mastoid air cells are predominantly clear. Pneumatization of the petrous apices. Orbital structures are unremarkable aside from prior lens extractions. Other: None IMPRESSION: 1. No acute intracranial abnormality. 2. Chronic stable parenchymal volume loss and chronic microvascular angiopathy. Electronically Signed   By: Lovena Le M.D.   On: 08/25/2019 23:32   CT HEAD WO CONTRAST  Result Date: 08/06/2019 CLINICAL DATA:  Recurrent syncope EXAM: CT HEAD WITHOUT CONTRAST TECHNIQUE: Contiguous axial images were obtained from the base of the skull through the vertex without intravenous contrast. COMPARISON:  05/06/2019 FINDINGS: Brain: There is no mass, hemorrhage or extra-axial collection. The appearance of the white matter is normal for the patient's age. There is generalized atrophy. Vascular: No abnormal hyperdensity of the major intracranial arteries or dural venous sinuses. No intracranial atherosclerosis. Skull: The visualized skull base, calvarium and extracranial soft tissues are normal.  Sinuses/Orbits: No fluid levels or advanced mucosal thickening of the visualized paranasal sinuses. No mastoid or middle ear effusion. The orbits are normal. IMPRESSION: Generalized atrophy without acute intracranial abnormality. Electronically Signed   By: Ulyses Jarred M.D.   On: 08/06/2019 21:42   MR BRAIN WO CONTRAST  Result Date: 08/09/2019 CLINICAL DATA:  Dizziness, dehydration or hypotension. Additional history provided: Medical history significant for esophageal cancer on palliative chemotherapy, hypertension, dementia, hyperlipidemia, BPH, anxiety disorder presenting with intermittent dizziness. EXAM: MRI HEAD WITHOUT CONTRAST TECHNIQUE: Multiplanar, multiecho pulse sequences of the brain and surrounding structures were obtained without intravenous contrast. COMPARISON:  Noncontrast head CT 08/06/2019, brain MRI 10/28/2017. FINDINGS: Brain: Please note evaluation for intracranial metastatic disease is limited on this noncontrast study. There are few scattered punctate foci of T2/FLAIR hyperintensity within the cerebral white matter, unchanged and nonspecific. Stable moderate correct atrophy with somewhat disproportionate involvement of the medial temporal lobes. There is no acute infarct. No evidence of intracranial mass. No chronic intracranial blood products. No extra-axial fluid collection. No midline shift. Vascular: Expected proximal arterial flow voids. Skull and upper cervical spine: No focal marrow lesion. Sinuses/Orbits: Visualized orbits show no acute finding. Minimal ethmoid sinus mucosal thickening. No significant mastoid effusion. IMPRESSION: 1. Please note evaluation for intracranial metastatic disease is limited on this non-contrast exam. 2. No evidence of acute intracranial abnormality. 3. Stable moderate parenchymal atrophy with somewhat disproportionate involvement of the medial temporal lobes. 4. A few  scattered punctate T2 hyperintense signal changes within the cerebral white matter  are unchanged and nonspecific. 5. Minimal ethmoid sinus mucosal thickening. Electronically Signed   By: Kellie Simmering DO   On: 08/09/2019 15:38   CT ABDOMEN PELVIS W CONTRAST  Result Date: 08/28/2019 CLINICAL DATA:  Nausea and vomiting, history of esophageal cancer EXAM: CT ABDOMEN AND PELVIS WITH CONTRAST TECHNIQUE: Multidetector CT imaging of the abdomen and pelvis was performed using the standard protocol following bolus administration of intravenous contrast. CONTRAST:  156m OMNIPAQUE IOHEXOL 300 MG/ML  SOLN COMPARISON:  05/06/2019, 07/05/2019 FINDINGS: Lower chest: Circumferential wall thickening of the distal esophagus again noted, consistent with known esophageal cancer. Overall, significant improvement since prior PET scan. No acute airspace disease.  Trace right pleural effusion. Hepatobiliary: Minimal gallbladder sludge. No calcified gallstones or acute cholecystitis. Liver is unremarkable. Pancreas: Unremarkable. No pancreatic ductal dilatation or surrounding inflammatory changes. Spleen: Spleen is enlarged measuring 15.8 cm in craniocaudal length, grossly stable. Adrenals/Urinary Tract: There is a nonobstructing 8 mm distal left ureteral calculus. Punctate 3 mm nonobstructing right renal calculus is noted. No obstructive uropathy within either kidney. The kidneys enhance normally and symmetrically. The adrenals are unremarkable. Bladder is moderately distended without focal abnormality. Stomach/Bowel: No bowel obstruction or ileus. Normal appendix right lower quadrant. Percutaneous gastrostomy tube is seen within the gastric lumen. Vascular/Lymphatic: There is progressive retroperitoneal adenopathy. Left para-aortic adenopathy measures up to 16 mm in short axis reference image 31. The lymphadenopathy at the celiac axis and within the gastrohepatic ligament on prior PET scan are no longer identified. Minimal atherosclerosis of the aorta unchanged. Reproductive: Prostate is mildly enlarged but stable.  Other: No abdominal wall hernia or abnormality. No abdominopelvic ascites. Musculoskeletal: No acute or destructive bony lesions. Reconstructed images demonstrate no additional findings. IMPRESSION: 1. Persistent distal esophageal mass consistent with known esophageal cancer. Overall improvement since recent PET scan. 2. Progressive retroperitoneal lymphadenopathy, with new and enlarging lymph nodes as above. The previously seen celiac and gastrohepatic ligament adenopathy has resolved. 3. Stable splenomegaly. 4. Trace right pleural effusion. 5. Stable bilateral urinary tract calculi without obstruction. Electronically Signed   By: MRanda NgoM.D.   On: 08/28/2019 21:15   DG ABDOMEN PEG TUBE LOCATION  Result Date: 08/05/2019 CLINICAL DATA:  Peg tube evaluation. EXAM: ABDOMEN - 1 VIEW COMPARISON:  IR gastrostomy placement 07/08/2019 FINDINGS: A single frontal view of the abdomen is provided. A PEG tube balloon is seen in the left upper quadrant projecting over the stomach. There is contrast within the stomach and proximal duodenum. No evidence of extraluminal contrast. The bowel gas pattern is nonobstructive. IMPRESSION: PEG tube balloon projects over the stomach. Contrast is seen within the stomach and proximal duodenum. No evidence of extraluminal contrast. Electronically Signed   By: NAudie PintoM.D.   On: 08/05/2019 18:06    ASSESSMENT AND PLAN: 1. Esophagus cancer ? CT 05/06/2019-circumferential thickening of the distal esophagus, mild splenomegaly, bladder wall thickening, enlarged prostate ? Upper endoscopy 06/11/2019-ulcerated mass at the lower esophagus, 36 cm from the incisors, partially obstructing, biopsy revealed atypical cells-reactive change versus poorly differentiated neoplasm, [facility] biopsy 06/21/2019 confirmed a poorly differentiated malignant neoplasm ? HER-2 negative, PD-L1 combined positive score -20 ? Repeat endoscopy  06/21/2019-partially obstructing esophageal tumor found in the lower third of the esophagus ? PET scan 07/05/2019-large esophageal mass with nodal involvement both above and below the diaphragm including a left periaortic node ? Palliative radiation to  the esophagus mass starting 07/22/2019, completed 08/09/2019 ? Cycle 1 pembrolizumab 08/15/2019 2. Dementia 3. Anxiety/depression 4. Sleep apnea 5. BPH 6. Dysphagia secondary to #1  Gastrostomy tube placement 07/08/2019 7. Hyperlipidemia 8. Admission with hematemesis 07/21/2019 9. Admission for dehydration and pre-syncope in clinic 08/06/19 10. Recurrent nausea and vomiting-etiology unclear   Mr. Lavigne appears unchanged.  His nausea and vomiting seem to improve with stopping his tube feedings.  He has developed some diarrhea this morning which may be related to recent [facility] but more likely due to tube feedings.  Hopefully, this will improve since the tube feedings have been stopped.  Would consider GI evaluation if nausea and vomiting recur.  Recommendations: 1.  Continue to hold tube feedings 2.  Diet as tolerated, calorie count, record I's/O's 3.  Continue Florinef, midodrine, and support stockings for the orthostasis, will repeat orthostatic vital signs today 4.  Plan for cycle 2 pembrolizumab next week 5.  GI consult recommended if he has recurrent nausea and vomiting 6.  Skilled nursing facility placement 7.  Please call oncology as needed   LOS: 3 days   Mikey Bussing 08/29/19  [name] was interviewed and examined.  No further nausea and vomiting.  I suspect the loose stool is related to tube feedings.  I doubt this represents toxicity from the pembrolizumab.  He will need skilled nursing facility placement at discharge.  I discussed the case with Dr. Alfredia Ferguson.  I recommend holding tube feedings and assessing whether he can maintain his hydration/nutrition by mouth.  Outpatient follow-up is scheduled at the Cancer center.

## 2019-08-29 NOTE — Progress Notes (Signed)
David [name] calls stating she looked up Zocor and it has side effects of extreme skeletal muscle weakness and dark urine.  She would like Dr. Benay Spice to stop his Zocor.  I spoke with Dr. Benay Spice he agrees.

## 2019-08-29 NOTE — Progress Notes (Signed)
PROGRESS NOTE    David [name]  INO:[id number] DOB: 05/29/1945 DOA: 08/25/2019 PCP: Leighton Ruff, MD   Brief Narrative:  HPI per Dr. Gean Birchwood on 08/25/19 David [name] is a 74 y.o. male with history of esophageal cancer PEG tube placement BPH dementia depression who was recently admitted for syncope on August 06, 2019 discharged on Aug 13, 2019 was observed to be increasingly weak over the last 4 to 5 days and getting more confused.  Patient also had some vomiting and patient's oncologist Dr. Learta Codding advised to stop the enteral feeds for 2 days.  And it was restarted again 2 days ago following which later in the evening patient had some vomiting patient wife also noticed some fevers around around 99 degrees.  Patient also has very frequent urination.  Did not complain of any pain or any shortness of breath.  Patient also has been having dizziness when he stands up.  Has some cough nonproductive.  Given these changes patient was brought to the ER.  ED Course: In the ER patient was alert awake oriented to his name and place moving all extremities.  CT head was unremarkable.  Labs show WBC of 10.6 hemoglobin 12.9 calcium was 10.8 sodium 131 lactic acid 0.8 EKG normal sinus rhythm.  Covid test was negative.  UA was concerning for UTI and chest x-ray shows possible infiltrates.  Cultures were obtained and patient started on antibiotics and admitted for further observation.  **Interim History  Continued to have some nausea and vomiting this is improved today as CT abdomen pelvis was unremarkable and KUB was unremarkable as well.  Likely his nausea vomiting is related to the tube feedings and this has not been held or stopped.  In the interim we will obtain a calorie count release 40 hours to see how the patient adequately takes his nutrition and..  Medical oncology is consulted, and continue to hold tube feedings and diet as tolerated.  We will continue IV fluids for now.  Patient still  continues to be orthostatic and repeat orthostatic vital signs show that he is still orthostatic so will try an abdominal binder and also give the patient a 1 L fluid bolus in addition to continuing IV fluid hydration.  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  Patient admitted with cough and confusion.   -Urine culture shows no growth.  Patient still confused though better than yesterday per staff however oncology feels that he is at his baseline given that he does have dementia.   -On Rocephin and azithromycin. -Repeat CXR in the AM   Nausea and Vomiting  -Persistent but has now improved -C/w Antiemetics -Check CT Abd/Pelvis w/ Contrast and showed persistent distal esophageal mass consistent with known esophageal cancer and also did show progressive retroperitoneal lymphadenopathy with new enlarging lymph nodes; the previous celiac and gastrohepatic ligament adenopathy had now resolved and he does have some stable splenomegaly as well as a trace right pleural effusion.  Patient also did have some stable bilateral urinary tract calculi without obstruction -Discussed with GI and if continues to Vomit they will formally see however since he has stopped vomiting we will hold off the GI consult for now  Orthostatic Hypotension -Patient is profoundly orthostatic with a blood pressure of  150/80 laying down to 100/68 on standing the day before yesterday and continues to be orthostatic today -Start normal saline and will give a bolus of normal saline. -Repeat orthostatic vital signs in the a.m. -Continue with midodrine, Florinef, TED hose, and will order an abdominal binder.  Unfortunately we do not carry droxidopa however we would  likely try that in the outpatient setting  Status post PEG tube -Hold feeds now that he continues to Vomit. -We will obtain a calorie count and have consulted nutrition for further evaluation  Depression  -On Wellbutrin and Lexapro  Normocytic Anemia -Hgb/Hct now is 11.1/33.6 -Check Anemia Panel in the AM -Continue to Monitor for S/Sx of Bleeding; No Overt Bleeding noted -Repeat CBC in AM   Thrombocytopenia -Patient's Platelet Count is now 140,000 -Continue to Monitor and Trend; Currently no overt bleeding noted -Repeat CBC in AM   Hypokalemia -Mild at 3.1 -Replete with po KCl 40 mEQ x2 again  -Continue to Monitor and Replete as Necessary -Repeat CMP in QAM  Hypophosphatemia -Patient's Phos Level is now 3.1 -Replete with IV KPhos 20 mmol -Continue to Monitor and Repelte as Necessary -Repeat Phos Level in the AM   History of Dementia  -Worsening; Oncology feels he is at his baseline -He continues to complain of being "confused and groggy" -Place on Delirium Precautions -Has a 1:1 Safety Sitter   History of Esophageal Cancer  -followed by Dr. Benay Spice and is being seen inpatient -Appreciate Dr. Gearldine Shown evaluation  Goals of Care -Dr. Rodena Piety discussed with patient's wife, has living will at home.   -CODE STATUS changed to DNR which is appropriate for the patient.   -She also expressed a desire to speak with hospice.  I have discussed with [facility] hosp[facility] who will contact her.  DVT prophylaxis: Apparent 40 mg subcu every 24 Code Status: DO NOT RESUSCITATE  Family Communication: Discussed with Wife via the Telephone twice today Disposition Plan: Pending further improvement of his orthostatic hypotension  Status is: Inpatient  Remains inpatient appropriate because:Ongoing diagnostic testing needed not appropriate for outpatient work up and IV treatments appropriate due to intensity of illness or inability to take PO; Will get a Calorie Count  Dispo: The  patient is from: Home              Anticipated d/c is to: SNF              Anticipated d/c date is: 2 days              Patient currently is not medically stable to d/c. given continued Orthostasis    Consultants:   Medical Oncology   Procedures:  None   Antimicrobials:  Anti-infectives (From admission, onward)   Start     Dose/Rate Route Frequency Ordered Stop   08/27/19 0200  cefTRIAXone (ROCEPHIN) 2 g in sodium chloride 0.9 % 100 mL IVPB     2 g 200 mL/hr over 30 Minutes Intravenous Every 24 hours 08/26/19 0411 09/01/19 0159   08/26/19 0500  azithromycin (ZITHROMAX) 500 mg in sodium chloride 0.9 % 250 mL IVPB     500 mg 250 mL/hr over 60 Minutes Intravenous Every 24 hours 08/26/19 0411 08/31/19 0459   08/26/19 0115  cefTRIAXone (ROCEPHIN) 2 g in sodium chloride 0.9 % 100 mL IVPB     2 g 200 mL/hr over 30 Minutes Intravenous  Once 08/26/19 0106 08/26/19 0244     Subjective: Seen and examined at bedside and he is pleasantly demented still  and complaining of pain "groggy" as well as "confused".  Denies any chest pain, lightheadedness or dizziness.  Had an episode of diarrhea this morning.  No further nausea or vomiting.  No other concerns or complaints at this time and updated the wife twice today.  Objective: Vitals:   08/29/19 1637 08/29/19 1812 08/29/19 1814 08/29/19 1818  BP: (!) 148/90 (!) 158/85 (!) 122/104 137/80  Pulse: 76 88 82 78  Resp: 16     Temp: 98.7 F (37.1 C)     TempSrc: Oral     SpO2: 100%     Weight:      Height:        Intake/Output Summary (Last 24 hours) at 08/29/2019 1935 Last data filed at 08/29/2019 1834 Gross per 24 hour  Intake 340 ml  Output 1600 ml  Net -1260 ml   Filed Weights   08/27/19 0500 08/28/19 0416 08/29/19 0623  Weight: 101.6 kg 101.1 kg 100.3 kg   Examination: Physical Exam:  Constitutional: WN/WD overweight Caucasian male currently in no acute distress appears calm but does appear little uncomfortable. Eyes: Lids and  conjunctivae normal, sclerae anicteric  ENMT: External Ears, Nose appear normal. Grossly normal hearing.  Neck: Appears normal, supple, no cervical masses, normal ROM, no appreciable thyromegaly: No JVD Respiratory: Diminished to auscultation bilaterally, no wheezing, rales, rhonchi or crackles. Normal respiratory effort and patient is not tachypenic. No accessory muscle use.  Unlabored breathing Cardiovascular: RRR, no murmurs / rubs / gallops. S1 and S2 auscultated. No extremity edema. 2+ pedal pulses. No carotid bruits.  Abdomen: Soft, non-tender, distended secondary to body habitus no masses palpated. Bowel sounds positive.  GU: Deferred. Musculoskeletal: No clubbing / cyanosis of digits/nails. Normal strength and muscle tone.  Skin: No rashes, lesions, ulcers or limited skin evaluation. No induration; Warm and dry.  Neurologic: CN 2-12 grossly intact with no focal deficits.  Romberg sign and cerebellar reflexes not assessed.  Psychiatric: Continues to have slightly impaired judgment and insight. Alert and oriented x 3. Normal mood and appropriate affect.   Data Reviewed: I have personally reviewed following labs and imaging studies  CBC: Recent Labs  Lab 08/25/19 2048 08/26/19 0450 08/28/19 0924 08/29/19 0549  WBC 10.6* 7.6 7.4 6.6  NEUTROABS 8.6*  --  5.8 5.1  HGB 12.9* 11.5* 12.1* 11.1*  HCT 37.5* 34.2* 35.5* 33.6*  MCV 83.9 87.2 87.7 88.0  PLT 173 132* 139* 426*   Basic Metabolic Panel: Recent Labs  Lab 08/25/19 2048 08/26/19 0450 08/28/19 0924 08/29/19 0549  NA 131* 135 133* 131*  K 4.0 4.0 3.1* 3.1*  CL 99 103 101 98  CO2 23 27 22 25   GLUCOSE 110* 108* 123* 113*  BUN 21 16 9  6*  CREATININE 1.15 1.20 0.98 0.85  CALCIUM 10.8* 10.3 9.9 9.9  MG  --   --  1.9 1.8  PHOS  --   --  2.1* 3.1   GFR: Estimated Creatinine Clearance: 99.3 mL/min (by C-G formula based on SCr of 0.85 mg/dL). Liver Function Tests: Recent Labs  Lab 08/25/19 2048 08/28/19 0924  08/29/19 0549  AST 25 33 42*  ALT 22 29 42  ALKPHOS 53 51 52  BILITOT 1.5* 0.9 0.6  PROT 6.2* 5.5* 5.3*  ALBUMIN 3.3* 2.9* 2.8*   No results for input(s): LIPASE, AMYLASE in the last 168 hours. No results for input(s): AMMONIA in the last 168 hours. Coagulation Profile: No results for input(s): INR, PROTIME in the last 168 hours. Cardiac  Enzymes: No results for input(s): CKTOTAL, CKMB, CKMBINDEX, TROPONINI in the last 168 hours. BNP (last 3 results) No results for input(s): PROBNP in the last 8760 hours. HbA1C: No results for input(s): HGBA1C in the last 72 hours. CBG: Recent Labs  Lab 08/29/19 0634 08/29/19 0809 08/29/19 1243 08/29/19 1626 08/29/19 1629  GLUCAP 107* 105* 104* 99 96   Lipid Profile: No results for input(s): CHOL, HDL, LDLCALC, TRIG, CHOLHDL, LDLDIRECT in the last 72 hours. Thyroid Function Tests: No results for input(s): TSH, T4TOTAL, FREET4, T3FREE, THYROIDAB in the last 72 hours. Anemia Panel: No results for input(s): VITAMINB12, FOLATE, FERRITIN, TIBC, IRON, RETICCTPCT in the last 72 hours. Sepsis Labs: Recent Labs  Lab 08/25/19 2355  LATICACIDVEN 0.8    Recent Results (from the past 240 hour(s))  Blood culture (routine x 2)     Status: None (Preliminary result)   Collection Time: 08/25/19 11:00 PM   Specimen: BLOOD  Result Value Ref Range Status   Specimen Description   Final    BLOOD LEFT ANTECUBITAL Performed at [facility] [address], [place], [place] [address]    Special Requests   Final    BOTTLES DRAWN AEROBIC AND ANAEROBIC Blood Culture adequate volume Performed at [facility] [address] [place], [place] [address]    Culture   Final    NO GROWTH 3 DAYS Performed at [facility], [place] [address], [place], [place] [address]    Report Status PENDING  Incomplete  Blood culture (routine x 2)     Status: None (Preliminary result)   Collection Time: 08/25/19 11:04 PM   Specimen:  BLOOD  Result Value Ref Range Status   Specimen Description   Final    BLOOD LEFT HAND Performed at [facility] [address] [place], [place] [address]    Special Requests   Final    BOTTLES DRAWN AEROBIC AND ANAEROBIC Blood Culture adequate volume Performed at [facility] [address], [place], [place] [address]    Culture   Final    NO GROWTH 3 DAYS Performed at [facility], [place] [address], [place], [place] [address]    Report Status PENDING  Incomplete  Urine Culture     Status: Abnormal   Collection Time: 08/26/19 12:58 AM   Specimen: Urine, Random  Result Value Ref Range Status   Specimen Description   Final    URINE, RANDOM Performed at [facility] [address], [place], [place] [address]    Special Requests   Final    NONE Performed at [facility], [place] [address] [place], [place] [address]    Culture (A)  Final    <10,000 COLONIES/mL INSIGNIFICANT GROWTH Performed at [facility] [address], [place], [place] [address]    Report Status 08/27/2019 FINAL  Final  SARS Coronavirus 2 by RT PCR (hospital order, performed in [facility] hospital lab) Nasopharyngeal Nasopharyngeal Swab     Status: None   Collection Time: 08/26/19  1:06 AM   Specimen: Nasopharyngeal Swab  Result Value Ref Range Status   SARS Coronavirus 2 NEGATIVE NEGATIVE Final    Comment: (NOTE) SARS-CoV-2 target nucleic acids are NOT DETECTED. The SARS-CoV-2 RNA is generally detectable in upper and lower respiratory specimens during the acute phase of infection. The lowest concentration of SARS-CoV-2 viral copies this assay can detect is 250 copies / mL. A negative result does  not preclude SARS-CoV-2 infection and should not be used as the sole basis for treatment or other patient management decisions.  A negative result may occur with improper specimen collection / handling, submission of specimen  other than nasopharyngeal swab, presence of viral mutation(s) within the areas targeted by this assay, and inadequate number of viral copies (<250 copies / mL). A negative result must be combined with clinical observations, patient history, and epidemiological information. Fact Sheet for Patients:   StrictlyIdeas.no Fact Sheet for Healthcare Providers: BankingDealers.co.za This test is not yet approved or cleared  by the [place] FDA and has been authorized for detection and/or diagnosis of SARS-CoV-2 by FDA under an Emergency Use Authorization (EUA).  This EUA will remain in effect (meaning this test can be used) for the duration of the COVID-19 declaration under Section 564(b)(1) of the Act, 21 U.S.C. section 360bbb-3(b)(1), unless the authorization is terminated or revoked sooner. Performed at [facility], [place] [address] [place], [place] [address]     RN Pressure Injury Documentation:     Estimated body mass index is 28.39 kg/m as calculated from the following:   Height as of this encounter: 6\' 2"  (4.40 m).   Weight as of this encounter: 100.3 kg.  Malnutrition Type:  Nutrition Problem: Increased nutrient needs Etiology: cancer and cancer related treatments   Malnutrition Characteristics:  Signs/Symptoms: estimated needs   Nutrition Interventions:  Interventions: Tube feeding   Radiology Studies: DG Abd 1 View  Result Date: 08/29/2019 CLINICAL DATA:  Intractable nausea and vomiting. History of esophageal cancer. EXAM: ABDOMEN - 1 VIEW COMPARISON:  08/05/2019 abdominal film and CT scan 08/28/2019 FINDINGS: There is scattered contrast in the right colon and rectum. No findings for obstruction or perforation. IMPRESSION: No findings for bowel obstruction. Electronically Signed   By: Marijo Sanes M.D.   On: 08/29/2019 08:05   CT ABDOMEN PELVIS W CONTRAST  Result Date: 08/28/2019 CLINICAL DATA:   Nausea and vomiting, history of esophageal cancer EXAM: CT ABDOMEN AND PELVIS WITH CONTRAST TECHNIQUE: Multidetector CT imaging of the abdomen and pelvis was performed using the standard protocol following bolus administration of intravenous contrast. CONTRAST:  172mL OMNIPAQUE IOHEXOL 300 MG/ML  SOLN COMPARISON:  05/06/2019, 07/05/2019 FINDINGS: Lower chest: Circumferential wall thickening of the distal esophagus again noted, consistent with known esophageal cancer. Overall, significant improvement since prior PET scan. No acute airspace disease.  Trace right pleural effusion. Hepatobiliary: Minimal gallbladder sludge. No calcified gallstones or acute cholecystitis. Liver is unremarkable. Pancreas: Unremarkable. No pancreatic ductal dilatation or surrounding inflammatory changes. Spleen: Spleen is enlarged measuring 15.8 cm in craniocaudal length, grossly stable. Adrenals/Urinary Tract: There is a nonobstructing 8 mm distal left ureteral calculus. Punctate 3 mm nonobstructing right renal calculus is noted. No obstructive uropathy within either kidney. The kidneys enhance normally and symmetrically. The adrenals are unremarkable. Bladder is moderately distended without focal abnormality. Stomach/Bowel: No bowel obstruction or ileus. Normal appendix right lower quadrant. Percutaneous gastrostomy tube is seen within the gastric lumen. Vascular/Lymphatic: There is progressive retroperitoneal adenopathy. Left para-aortic adenopathy measures up to 16 mm in short axis reference image 31. The lymphadenopathy at the celiac axis and within the gastrohepatic ligament on prior PET scan are no longer identified. Minimal atherosclerosis of the aorta unchanged. Reproductive: Prostate is mildly enlarged but stable. Other: No abdominal wall hernia or abnormality. No abdominopelvic ascites. Musculoskeletal: No acute or destructive bony lesions. Reconstructed images demonstrate no additional findings. IMPRESSION: 1. Persistent distal  esophageal mass consistent with known  esophageal cancer. Overall improvement since recent PET scan. 2. Progressive retroperitoneal lymphadenopathy, with new and enlarging lymph nodes as above. The previously seen celiac and gastrohepatic ligament adenopathy has resolved. 3. Stable splenomegaly. 4. Trace right pleural effusion. 5. Stable bilateral urinary tract calculi without obstruction. Electronically Signed   By: Randa Ngo M.D.   On: 08/28/2019 21:15   Scheduled Meds: . buPROPion  150 mg Oral Daily  . enoxaparin (LOVENOX) injection  40 mg Subcutaneous Q24H  . escitalopram  20 mg Oral Daily  . fludrocortisone  0.1 mg Oral Daily  . midodrine  10 mg Oral TID WC  . pantoprazole sodium  40 mg Oral BID  . potassium chloride  40 mEq Oral BID  . saccharomyces boulardii  250 mg Oral BID  . tamsulosin  0.4 mg Oral QHS  . vitamin B-12  1,000 mcg Oral Daily   Continuous Infusions: . sodium chloride    . azithromycin 500 mg (08/29/19 1194)  . cefTRIAXone (ROCEPHIN)  IV 2 g (08/29/19 0114)  . magnesium sulfate bolus IVPB    . sodium chloride      LOS: 3 days   Kerney Elbe, DO [facility] PAGER is on [place]  If 7PM-7AM, please contact night-coverage [email or web address]

## 2019-08-29 NOTE — Plan of Care (Signed)
  Problem: Education: Goal: Knowledge of General Education information will improve Description: Including pain rating scale, medication(s)/side effects and non-pharmacologic comfort measures Outcome: Not Progressing Note: Patient with confusion. Continue to reorient patient and provide reassurance.   Problem: Elimination: Goal: Will not experience complications related to urinary retention Outcome: Progressing   Problem: Pain Managment: Goal: General experience of comfort will improve Outcome: Progressing   Problem: Safety: Goal: Ability to remain free from injury will improve Outcome: Progressing   Problem: Skin Integrity: Goal: Risk for impaired skin integrity will decrease Outcome: Progressing

## 2019-08-29 NOTE — Care Management Important Message (Signed)
Important Message  Patient Details IM Letter given to Shari Heritage SW Case Manager to present to the Patient Name: David [name] MRN: [id number] Date of Birth: 11-15-1945   Medicare Important Message Given:  Yes     Kerin Salen 08/29/2019, 10:23 AM

## 2019-08-29 NOTE — Progress Notes (Addendum)
Physical Therapy Treatment Patient Details Name: David [name] MRN: [id number] DOB: Aug 18, 1945 Today's Date: 08/29/2019    History of Present Illness 74 y.o. male with medical history significant of esophageal cancer on palliative chemotherapy, hypertension, dementia, hyperlipidemia, BPH and anxiety disorder and admitted with AMS/fever, found to have UTI and possible PNA .  Pt with recent admission for presyncope likely secondary to orthostasis.    PT Comments    Patient remains limited with mobility by symptomatic orthostatic hypotension. He required assist today with bed mobility and after sitting EOB BP remained stable. Min assist required for sit<>stand and RW used for support to steady. Pt became dizzy, and lightheaded after rising. After ~3-4 minutes pt became slightly diaphoretic and reported increased dizziness. Min assist required to return to supine and pt's BP improved once back in bed. Acute PT will continue to follow pt and progress as able. If pt remains medically stable to progress mobility with gait he may be able to return home with assist from family; if he remains limited he may benefit from short SNF placement for further therapy services. Acute PT will follow as able.  Orthostatic VS for the past 24 hrs:  BP- Lying Pulse- Lying BP- Sitting Pulse- Sitting BP- Standing at 0 minutes Pulse- Standing at 0 minutes BP- Standing at 3 minutes Pulse- Standing at 3 minutes  08/29/19 1758 142/74 67 149/84 88 108/62 97 98/66 96   Vitals after returning to bed  Vitals with BMI 08/29/2019 (1818) 08/29/2019 (1814) 4/69/6295 (2841)  Systolic 324 401 027  Diastolic 80 253 85  Pulse 78 82 88  Position Reverse trendelenburg Supine/flat Semi-supine (HOB elevated to eat/drink)  Some encounter information is confidential and restricted. Go to Review Flowsheets activity to see all data.        Follow Up Recommendations  Home health PT;Supervision/Assistance - 24 hour;SNF(HHPT vs SNF  pending progress, pt's family does not want SNF )     Equipment Recommendations  None recommended by PT    Recommendations for Other Services       Precautions / Restrictions Precautions Precautions: Fall Precaution Comments: G tube, orthostatic Restrictions Weight Bearing Restrictions: No    Mobility  Bed Mobility Overal bed mobility: Needs Assistance Bed Mobility: Supine to Sit;Sit to Supine     Supine to sit: Min assist;HOB elevated Sit to supine: Min assist;HOB elevated   General bed mobility comments: pt with some trouble sequencing bed mobility to sit up EOB, assist required to pivot LE's off EOB and raise trunk. suspect this is because pt was rushing to sit and use the urinal. Assist required to bring LE's into bed to return to supine due to fatigue and weakness due to symptomatic hypotension.  Transfers Overall transfer level: Needs assistance Equipment used: Rolling walker (2 wheeled) Transfers: Sit to/from Stand Sit to Stand: Min assist         General transfer comment: assist to initiate and complete power up. assist to steady in standing. pt able to position urinal in standing to void bladder.  Ambulation/Gait          Stairs             Wheelchair Mobility    Modified Rankin (Stroke Patients Only)       Balance Overall balance assessment: Needs assistance Sitting-balance support: Feet supported Sitting balance-Leahy Scale: Good     Standing balance support: Bilateral upper extremity supported Standing balance-Leahy Scale: Fair  Cognition Arousal/Alertness: Awake/alert Behavior During Therapy: WFL for tasks assessed/performed Overall Cognitive Status: History of cognitive impairments - at baseline          General Comments: pt with history of dementia and memory loss. much improved today with awareness and memory. pt oriented to self, place, situation. he is able to remeber therapist and techs names and  follows commands consistently. Pt holding converstaion and answers all questions appropriately.       Exercises      General Comments General comments (skin integrity, edema, etc.): BP assessed throughout mobility. Pt maintained BP from supine to sit however had significant drop in standing at 0 m inutes and larger drop in BP at ~3-4 minutes standing. Pt required assist to clean due to BM in standing and returned to supine. BP icnresaed with pt positioned in trendelenburg and stayed within normal range as he was raised to more upright position gradually. See vitals for details.      Pertinent Vitals/Pain Pain Assessment: No/denies pain           PT Goals (current goals can now be found in the care plan section) Acute Rehab PT Goals Patient Stated Goal: To demonstrate daily tasks without fatigue PT Goal Formulation: With patient Time For Goal Achievement: 09/17/19 Potential to Achieve Goals: Good Progress towards PT goals: Not progressing toward goals - comment(pt has been limited by orthostatic hypotension)    Frequency    Min 3X/week      PT Plan Current plan remains appropriate       AM-PAC PT "6 Clicks" Mobility   Outcome Measure  Help needed turning from your back to your side while in a flat bed without using bedrails?: A Little Help needed moving from lying on your back to sitting on the side of a flat bed without using bedrails?: A Little Help needed moving to and from a bed to a chair (including a wheelchair)?: A Little Help needed standing up from a chair using your arms (e.g., wheelchair or bedside chair)?: A Little Help needed to walk in hospital room?: A Lot Help needed climbing 3-5 steps with a railing? : A Lot 6 Click Score: 16    End of Session Equipment Utilized During Treatment: Gait belt Activity Tolerance: Treatment limited secondary to medical complications (Comment)(symptomatic orthostatic hypotension) Patient left: in bed;with call bell/phone  within reach;with bed alarm set;with nursing/sitter in room Nurse Communication: Mobility status PT Visit Diagnosis: Difficulty in walking, not elsewhere classified (R26.2)     Time: 6010-9323 PT Time Calculation (min) (ACUTE ONLY): 38 min  Charges:  $Therapeutic Activity: 38-52 mins                    Verner Mould, DPT Physical Therapist with [facility] [phone]  08/29/2019 7:39 PM

## 2019-08-29 NOTE — TOC Progression Note (Signed)
Transition of Care [facility]) - Progression Note    Patient Details  Name: David [name] MRN: [id number] Date of Birth: May 03, 1945  Transition of Care [facility]) CM/SW Contact  Servando Snare, [place] Phone Number: 08/29/2019, 1:22 PM  Clinical Narrative:   Patient has authorization through end of business day tomorrow.     Expected Discharge Plan: Skilled Nursing Facility Barriers to Discharge: Other (comment)(SNF pending wife's decision)  Expected Discharge Plan and Services Expected Discharge Plan: [place]                                               Social Determinants of Health (SDOH) Interventions    Readmission Risk Interventions No flowsheet data found.

## 2019-08-29 NOTE — TOC Transition Note (Signed)
Transition of Care [facility]) - CM/SW Discharge Note   Patient Details  Name: Geremiah Fussell Cristiano MRN: [id number] Date of Birth: 1945-09-19  Transition of Care [facility]) CM/SW Contact:  Servando Snare, LCSW Phone Number: 08/29/2019, 10:04 AM   Clinical Narrative:   Pt wife called and expressed concerns about facilities available to pt. She is not willing to send patient to facility options currently available. Spouse request that LCSW follow up with additional facilities. LCSW will reach out to facilities per spouse request.       Barriers to Discharge: Other (comment)(SNF pending wife's decision)   Patient Goals and CMS Choice        Discharge Placement                       Discharge Plan and Services                                     Social Determinants of Health (SDOH) Interventions     Readmission Risk Interventions No flowsheet data found.

## 2019-08-30 LAB — CBC WITH DIFFERENTIAL/PLATELET
Abs Immature Granulocytes: 0.1 10*3/uL — ABNORMAL HIGH (ref 0.00–0.07)
Basophils Absolute: 0 10*3/uL (ref 0.0–0.1)
Basophils Relative: 0 %
Eosinophils Absolute: 0.1 10*3/uL (ref 0.0–0.5)
Eosinophils Relative: 2 %
HCT: 34.6 % — ABNORMAL LOW (ref 39.0–52.0)
Hemoglobin: 11.8 g/dL — ABNORMAL LOW (ref 13.0–17.0)
Immature Granulocytes: 2 %
Lymphocytes Relative: 10 %
Lymphs Abs: 0.7 10*3/uL (ref 0.7–4.0)
MCH: 29.2 pg (ref 26.0–34.0)
MCHC: 34.1 g/dL (ref 30.0–36.0)
MCV: 85.6 fL (ref 80.0–100.0)
Monocytes Absolute: 0.5 10*3/uL (ref 0.1–1.0)
Monocytes Relative: 7 %
Neutro Abs: 5.4 10*3/uL (ref 1.7–7.7)
Neutrophils Relative %: 79 %
Platelets: 159 10*3/uL (ref 150–400)
RBC: 4.04 MIL/uL — ABNORMAL LOW (ref 4.22–5.81)
RDW: 13.2 % (ref 11.5–15.5)
WBC: 6.8 10*3/uL (ref 4.0–10.5)
nRBC: 0 % (ref 0.0–0.2)

## 2019-08-30 LAB — GLUCOSE, CAPILLARY
Glucose-Capillary: 102 mg/dL — ABNORMAL HIGH (ref 70–99)
Glucose-Capillary: 104 mg/dL — ABNORMAL HIGH (ref 70–99)
Glucose-Capillary: 107 mg/dL — ABNORMAL HIGH (ref 70–99)
Glucose-Capillary: 120 mg/dL — ABNORMAL HIGH (ref 70–99)
Glucose-Capillary: 159 mg/dL — ABNORMAL HIGH (ref 70–99)
Glucose-Capillary: 96 mg/dL (ref 70–99)

## 2019-08-30 LAB — MAGNESIUM: Magnesium: 2.2 mg/dL (ref 1.7–2.4)

## 2019-08-30 LAB — COMPREHENSIVE METABOLIC PANEL
ALT: 52 U/L — ABNORMAL HIGH (ref 0–44)
AST: 45 U/L — ABNORMAL HIGH (ref 15–41)
Albumin: 2.9 g/dL — ABNORMAL LOW (ref 3.5–5.0)
Alkaline Phosphatase: 51 U/L (ref 38–126)
Anion gap: 6 (ref 5–15)
BUN: 5 mg/dL — ABNORMAL LOW (ref 8–23)
CO2: 22 mmol/L (ref 22–32)
Calcium: 10 mg/dL (ref 8.9–10.3)
Chloride: 103 mmol/L (ref 98–111)
Creatinine, Ser: 0.84 mg/dL (ref 0.61–1.24)
GFR calc Af Amer: >60 mL/min (ref >60)
GFR calc non Af Amer: >60 mL/min (ref >60)
Glucose, Bld: 109 mg/dL — ABNORMAL HIGH (ref 70–99)
Potassium: 3.3 mmol/L — ABNORMAL LOW (ref 3.5–5.1)
Sodium: 131 mmol/L — ABNORMAL LOW (ref 135–145)
Total Bilirubin: 1 mg/dL (ref 0.3–1.2)
Total Protein: 5.6 g/dL — ABNORMAL LOW (ref 6.5–8.1)

## 2019-08-30 LAB — PHOSPHORUS: Phosphorus: 2.1 mg/dL — ABNORMAL LOW (ref 2.5–4.6)

## 2019-08-30 MED ORDER — KATE FARMS STANDARD 1.4 PO LIQD
325.0000 mL | Freq: Two times a day (BID) | ORAL | Status: DC
  Administered 2019-08-30 – 2019-09-01 (×3): 325 mL via ORAL
  Filled 2019-08-30 (×8): qty 325

## 2019-08-30 MED ORDER — SODIUM CHLORIDE 0.9 % IV SOLN: INTRAVENOUS | Status: DC

## 2019-08-30 MED ORDER — SODIUM CHLORIDE 0.9 % IV BOLUS: 500.0000 mL | Freq: Once | INTRAVENOUS | Status: DC

## 2019-08-30 MED ORDER — SODIUM CHLORIDE 0.9 % IV SOLN: INTRAVENOUS | Status: AC

## 2019-08-30 MED ORDER — SODIUM CHLORIDE 0.9 % IV BOLUS
250.0000 mL | Freq: Once | INTRAVENOUS | Status: AC
  Administered 2019-08-30: 250 mL via INTRAVENOUS

## 2019-08-30 MED ORDER — POTASSIUM PHOSPHATES 15 MMOLE/5ML IV SOLN
30.0000 mmol | Freq: Once | INTRAVENOUS | Status: AC
  Administered 2019-08-30: 30 mmol via INTRAVENOUS
  Filled 2019-08-30: qty 10

## 2019-08-30 NOTE — Consult Note (Signed)
Referring Provider: Dr. Raiford Noble Primary Care Physician:  Leighton Ruff, MD Primary Gastroenterologist:  Dr. Cristina Gong  Reason for Consultation:  Emesis, esophageal cancer  HPI: David [name] is a 74 y.o. male with history of esophageal cancer (diagnosed 06/2019, underwent palliative radiation 07/22/19-08/09/19, pembrolizumab started 08/15/19, has PEG tube) presenting with emesis.  Patient was brought to the hospital 08/26/2019 for confusion, recent fever, and emesis.  Spoke with patient's wife, and he had increased vomiting on 5/10.  Improved on different tube feeds until 5/15 when he had fever and vomiting.  Patient's wife states emesis is clear with the tube feeding mixed in, no hematemesis.  Emesis seems to be triggered by tube feeds.  It is unclear whether or not patient has had persistent emesis versus spitting up mucus from the esophagus over the past few days. He further denies any abdominal pain, changes in bowel movements, melena, hematochezia.   He has been able to swallow liquids and solids, had some bacon for breakfast.    Per RN, he had an episode of very small volume emesis this morning but has mostly been spitting up "frothy" mucus from his esophagus.  On exam today, patient appears oriented and is able to tell me location, situation, date, and president.  However, he is unable to recall recent symptoms, thus history was obtained from patient's wife.  Records reviewed: -CT 08/28/19: Persistent distal esophageal mass consistent with known esophageal cancer. Overall improvement since recent PET scan. Progressive retroperitoneal lymphadenopathy, with new and enlarging lymph nodes as above. The previously seen celiac and gastrohepatic ligament adenopathy has resolved.  Stable splenomegaly. -Last EGD was 06/21/2019 for hematemesis which demonstrated a partially obstructing esophageal mass (bx: poorly differentiated malignant neoplasm) -EGD 06/11/19 revealed esophageal mass though bx at  the time was not definitive for neoplasm  Past Medical History:  Diagnosis Date  . Anxiety   . BPH (benign prostatic hyperplasia)   . Cancer ([place])   . Dementia ([place])   . Depression   . Hyperlipidemia   . Hypertension   . Kidney stone   . Memory loss   . 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 Sleep apnea    CPAP  . Vitamin D deficiency     Past Surgical History:  Procedure Laterality Date  . CATARACT EXTRACTION, BILATERAL  2018  . EXTERNAL EAR SURGERY     child  . EYE SURGERY Bilateral    cataract  . IR GASTROSTOMY TUBE MOD SED  07/08/2019  . right knee meniscus  06/12/2008  . right talus repair     dislocated    Prior to Admission medications   Medication Sig Start Date End Date Taking? Authorizing Provider  buPROPion (WELLBUTRIN XL) 150 MG 24 hr tablet TAKE 1 TABLET ONCE DAILY. Patient taking differently: Take 150 mg by mouth daily.  07/18/19  Yes [name], PMHNP  Cholecalciferol (VITAMIN D3 PO) Take 2,000 Units by mouth daily.   Yes [provider]  escitalopram (LEXAPRO) 20 MG tablet TAKE 1 TABLET ONCE DAILY. Patient taking differently: Take 20 mg by mouth daily.  07/18/19  Yes [name], PMHNP  esomeprazole (NEXIUM) 20 MG packet Take 20 mg by mouth daily. 07/25/19  Yes [provider]  HYDROcodone-acetaminophen (NORCO/VICODIN) 5-325 MG tablet Take 1 tablet by mouth every 6 (six) hours as needed for moderate pain. 08/13/19  Yes Eugenie Filler, MD  LORazepam (ATIVAN) 0.5 MG tablet Take 1 tablet (0.5 mg total) by mouth every 6 (six) hours as needed  for anxiety. 08/13/19  Yes Eugenie Filler, MD  midodrine (PROAMATINE) 10 MG tablet Take 1 tablet (10 mg total) by mouth 3 (three) times daily with meals. 08/23/19  Yes Ladell Pier, MD  Nutritional Supplements (FEEDING SUPPLEMENT, KATE FARMS STANDARD 1.4,) LIQD liquid 4 per day (1 bolus, 3 feeding pump) 80 ml/hr X 12 hrs  1820 calories/day or 4 cans/day 07/26/19  Yes Ladell Pier,  MD  ondansetron (ZOFRAN) 8 MG tablet Take 1 tablet (8 mg total) by mouth every 8 (eight) hours as needed for nausea or vomiting. 08/22/19  Yes Owens Shark, NP  prochlorperazine (COMPAZINE) 5 MG tablet Take 1 tablet (5 mg total) by mouth every 6 (six) hours as needed for nausea or vomiting. May take orally or crush and take via tube. 07/16/19  Yes Tanner, Lyndon Code., PA-C  simvastatin (ZOCOR) 40 MG tablet Take 40 mg by mouth daily.   Yes [provider]  Tamsulosin HCl (FLOMAX) 0.4 MG CAPS Take 0.4 mg by mouth at bedtime.    Yes [provider]  vitamin B-12 (CYANOCOBALAMIN) 1000 MCG tablet Take 1,000 mcg by mouth daily.   Yes [provider]    Scheduled Meds: . buPROPion  150 mg Oral Daily  . enoxaparin (LOVENOX) injection  40 mg Subcutaneous Q24H  . escitalopram  20 mg Oral Daily  . feeding supplement (KATE FARMS STANDARD 1.4)  325 mL Oral BID BM  . fludrocortisone  0.1 mg Oral Daily  . midodrine  10 mg Oral TID WC  . pantoprazole sodium  40 mg Oral BID  . saccharomyces boulardii  250 mg Oral BID  . tamsulosin  0.4 mg Oral QHS  . vitamin B-12  1,000 mcg Oral Daily   Continuous Infusions: . sodium chloride 75 mL/hr at 08/30/19 0903  . cefTRIAXone (ROCEPHIN)  IV 2 g (08/30/19 0223)  . potassium PHOSPHATE IVPB (in mmol) 30 mmol (08/30/19 0933)   PRN Meds:.LORazepam, ondansetron **OR** ondansetron (ZOFRAN) IV  Allergies as of 08/25/2019 - Review Complete 08/25/2019  Allergen Reaction Noted  . Other  10/10/2018  . Abilify [aripiprazole] Other (See Comments) 09/11/2018  . Lamictal [lamotrigine]  10/27/2017    Family History  Problem Relation Age of Onset  . Alzheimer's disease Mother   . Depression Mother   . Cancer Father        prostate  . Thyroid disease Father   . Alzheimer's disease Maternal Grandmother   . Heart disease Maternal Grandfather   . Stroke Paternal Grandmother   . Stroke Paternal Grandfather   . Alcoholism Brother   . Autism Daughter    . Autism Maternal Uncle     Social History   Socioeconomic History  . Marital status: Legally Separated    Spouse name: [name]  . Number of children: 2  . Years of education: 31  . 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 Smoking status: Never Smoker  . Smokeless tobacco: Never Used  Substance and Sexual Activity  . Alcohol use: Yes    Alcohol/week: 1.0 - 2.0 standard drinks    Types: 1 - 2 Shots of liquor per week    Comment: 1-2 shots 4-5 x week  . Drug use: Yes    Types: Marijuana    Comment: Occasional  . Sexual activity: Never  Other Topics Concern  . Not on file  Social History Narrative   Lives alone  retired.  Education: BS, MS Phd chemist.  Separated 8 yrs a/o spring 2020.  Adult children son, out of home, and daughter, adult with autistic spectrum, lives with estranged wife.  2 gks.  Mother died at age 80, of COVID, in the [place] outbreak -- emotionally destabilizing event late May/early June.        Cokes 0-3 daily as reported by nursing Sept 2019.  Gets medications via blister pack prepared by pharmacy a/o spring 2020, with generally good compliance but some mixing up of times and dates.   Social Determinants of Health   Financial Resource Strain:   . Difficulty of Paying Living Expenses:   Food Insecurity:   . Worried About Charity fundraiser in the Last Year:   . Arboriculturist in the Last Year:   Transportation Needs:   . Film/video editor (Medical):   Marland Kitchen Lack of Transportation (Non-Medical):   Physical Activity:   . Days of Exercise per Week:   . Minutes of Exercise per Session:   Stress:   . Feeling of Stress :   Social Connections:   . Frequency of Communication with Friends and Family:   . Frequency of Social Gatherings with Friends and Family:   . Attends Religious Services:   . Active Member of Clubs or Organizations:   . Attends Archivist Meetings:   Marland Kitchen  Marital Status:   Intimate Partner Violence:   . Fear of Current or Ex-Partner:   . Emotionally Abused:   Marland Kitchen Physically Abused:   . Sexually Abused:     Review of Systems: Limited by dementia/recent encephalopathy Review of Systems  Cardiovascular: Negative for chest pain and palpitations.  Gastrointestinal: Positive for vomiting. Negative for abdominal pain, blood in stool, constipation, diarrhea, heartburn, melena and nausea.   Physical Exam: Vital signs: Vitals:   08/29/19 2146 08/30/19 0534  BP: (!) 151/81 (!) 149/83  Pulse: 76 74  Resp:    Temp: 99 F (37.2 C) 97.9 F (36.6 C)  SpO2: 100% 99%   Last BM Date: 08/29/19 Physical Exam  Constitutional: He is oriented to person, place, and time. He appears well-developed and well-nourished. No distress.  HENT:  Head: Normocephalic and atraumatic.  Eyes: Conjunctivae and EOM are normal. No scleral icterus.  Cardiovascular: Normal rate, regular rhythm and normal heart sounds.  Pulmonary/Chest: Effort normal and breath sounds normal. No respiratory distress.  Abdominal: Soft. Bowel sounds are normal. He exhibits no distension and no mass. There is no abdominal tenderness. There is no rebound and no guarding.  Musculoskeletal:        General: No deformity or edema.     Cervical back: Normal range of motion and neck supple.  Neurological: He is alert and oriented to person, place, and time.  Skin: Skin is warm and dry.  Psychiatric: He has a normal mood and affect. His behavior is normal.    GI:  Lab Results: Recent Labs    08/28/19 0924 08/29/19 0549 08/30/19 0540  WBC 7.4 6.6 6.8  HGB 12.1* 11.1* 11.8*  HCT 35.5* 33.6* 34.6*  PLT 139* 140* 159   BMET Recent Labs    08/28/19 0924 08/29/19 0549 08/30/19 0540  NA 133* 131* 131*  K 3.1* 3.1* 3.3*  CL 101 98 103  CO2 22 25 22   GLUCOSE 123* 113* 109*  BUN 9 6* 5*  CREATININE 0.98 0.85 0.84  CALCIUM 9.9 9.9 10.0   LFT Recent Labs    08/30/19  0540  PROT  5.6*  ALBUMIN 2.9*  AST 45*  ALT 52*  ALKPHOS 51  BILITOT 1.0   PT/INR No results for input(s): LABPROT, INR in the last 72 hours.   Studies/Results: DG Abd 1 View  Result Date: 08/29/2019 CLINICAL DATA:  Intractable nausea and vomiting. History of esophageal cancer. EXAM: ABDOMEN - 1 VIEW COMPARISON:  08/05/2019 abdominal film and CT scan 08/28/2019 FINDINGS: There is scattered contrast in the right colon and rectum. No findings for obstruction or perforation. IMPRESSION: No findings for bowel obstruction. Electronically Signed   By: Marijo Sanes M.D.   On: 08/29/2019 08:05   CT ABDOMEN PELVIS W CONTRAST  Result Date: 08/28/2019 CLINICAL DATA:  Nausea and vomiting, history of esophageal cancer EXAM: CT ABDOMEN AND PELVIS WITH CONTRAST TECHNIQUE: Multidetector CT imaging of the abdomen and pelvis was performed using the standard protocol following bolus administration of intravenous contrast. CONTRAST:  122mL OMNIPAQUE IOHEXOL 300 MG/ML  SOLN COMPARISON:  05/06/2019, 07/05/2019 FINDINGS: Lower chest: Circumferential wall thickening of the distal esophagus again noted, consistent with known esophageal cancer. Overall, significant improvement since prior PET scan. No acute airspace disease.  Trace right pleural effusion. Hepatobiliary: Minimal gallbladder sludge. No calcified gallstones or acute cholecystitis. Liver is unremarkable. Pancreas: Unremarkable. No pancreatic ductal dilatation or surrounding inflammatory changes. Spleen: Spleen is enlarged measuring 15.8 cm in craniocaudal length, grossly stable. Adrenals/Urinary Tract: There is a nonobstructing 8 mm distal left ureteral calculus. Punctate 3 mm nonobstructing right renal calculus is noted. No obstructive uropathy within either kidney. The kidneys enhance normally and symmetrically. The adrenals are unremarkable. Bladder is moderately distended without focal abnormality. Stomach/Bowel: No bowel obstruction or ileus. Normal appendix right  lower quadrant. Percutaneous gastrostomy tube is seen within the gastric lumen. Vascular/Lymphatic: There is progressive retroperitoneal adenopathy. Left para-aortic adenopathy measures up to 16 mm in short axis reference image 31. The lymphadenopathy at the celiac axis and within the gastrohepatic ligament on prior PET scan are no longer identified. Minimal atherosclerosis of the aorta unchanged. Reproductive: Prostate is mildly enlarged but stable. Other: No abdominal wall hernia or abnormality. No abdominopelvic ascites. Musculoskeletal: No acute or destructive bony lesions. Reconstructed images demonstrate no additional findings. IMPRESSION: 1. Persistent distal esophageal mass consistent with known esophageal cancer. Overall improvement since recent PET scan. 2. Progressive retroperitoneal lymphadenopathy, with new and enlarging lymph nodes as above. The previously seen celiac and gastrohepatic ligament adenopathy has resolved. 3. Stable splenomegaly. 4. Trace right pleural effusion. 5. Stable bilateral urinary tract calculi without obstruction. Electronically Signed   By: Randa Ngo M.D.   On: 08/28/2019 21:15    Impression Emesis, possibly related to tube feeds, and esophageal cancer -Currently tolerating PO diet -Hgb 11.8 stable  Plan: -Proceed with EGD tomorrow to reevaluate esophageal mass and evaluate for radiation damage.  This will allow [place] to make further decisions regarding diet and tube feeds. -We thoroughly discussed the procedure to includes benefits and risks (including but not limited to bleeding infection, perforation, anesthesia) with both the patient and his wife.  Patient and his wife gave verbal consent to proceed with the EGD with Dr. Penelope Coop.  Verbal consent was obtained from patient's wife [name] Cervi via phone.  [facility] will follow.   LOS: 4 days   Salley Slaughter  PA-C 08/30/2019, 10:09 AM  Contact #  [phone]

## 2019-08-30 NOTE — Progress Notes (Signed)
PT Cancellation Note  Patient Details Name: David [name] MRN: [id number] DOB: November 13, 1945   Cancelled Treatment:     Pt working with Occupational Therapy attempting OOB to recliner but unable to tolerate due to BP's.  Will attempt to see another day as schedule permits.   Rica Koyanagi  PTA Acute  Rehabilitation Services Pager      [phone] Office      [phone]

## 2019-08-30 NOTE — Progress Notes (Signed)
PROGRESS NOTE    David [name]  OZD:[id number] DOB: [birth year] DOA: 08/25/2019 PCP: Leighton Ruff, MD   Brief Narrative:  HPI per Dr. Gean Birchwood on 08/25/19 David [name] is a 74 y.o. male with history of esophageal cancer PEG tube placement BPH dementia depression who was recently admitted for syncope on August 06, 2019 discharged on Aug 13, 2019 was observed to be increasingly weak over the last 4 to 5 days and getting more confused.  Patient also had some vomiting and patient's oncologist Dr. Learta Codding advised to stop the enteral feeds for 2 days.  And it was restarted again 2 days ago following which later in the evening patient had some vomiting patient wife also noticed some fevers around around 99 degrees.  Patient also has very frequent urination.  Did not complain of any pain or any shortness of breath.  Patient also has been having dizziness when he stands up.  Has some cough nonproductive.  Given these changes patient was brought to the ER.  ED Course: In the ER patient was alert awake oriented to his name and place moving all extremities.  CT head was unremarkable.  Labs show WBC of 10.6 hemoglobin 12.9 calcium was 10.8 sodium 131 lactic acid 0.8 EKG normal sinus rhythm.  Covid test was negative.  UA was concerning for UTI and chest x-ray shows possible infiltrates.  Cultures were obtained and patient started on antibiotics and admitted for further observation.  **Interim History  Continued to have some nausea and vomiting this is improved today as CT abdomen pelvis was unremarkable and KUB was unremarkable as well.  Likely his nausea vomiting is related to the tube feedings and this has not been held or stopped.  In the interim we will obtain a calorie count release 40 hours to see how the patient adequately takes his nutrition and..  Medical oncology is consulted, and continue to hold tube feedings and diet as tolerated.  We will continue IV fluids for now.  Patient still  continues to be orthostatic and repeat orthostatic vital signs show that he is still orthostatic so will try an abdominal binder and also give the patient a 1 L fluid bolus in addition to continuing IV fluid hydration.  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  Patient admitted with cough and confusion.   -Urine culture shows no growth.  Patient still confused though better than yesterday per staff however oncology feels that he is at his baseline given that he does have dementia.   -On Rocephin and azithromycin.  Today is day 6 of antibiotics -Repeat CXR in the AM   Nausea and Vomiting  -Persistent but has now improved yesterday but worsened again today -C/w Antiemetics -Check CT Abd/Pelvis w/ Contrast and showed persistent distal esophageal mass consistent with known esophageal cancer and also did show progressive retroperitoneal lymphadenopathy with new enlarging lymph nodes; the previous celiac and gastrohepatic ligament adenopathy had now resolved and he does have some stable splenomegaly as well as a trace right pleural effusion.  Patient also did have some stable bilateral urinary tract calculi without obstruction -KUB was unremarkable -We will have GI formally consult since he continued to vomit today and they are going to pursue endoscopy tomorrow to evaluate his esophagus  and help with diet recommendations based on the radiation damage to his residual cancer  Orthostatic Hypotension -Patient is profoundly orthostatic yesterday and symptomatic and was not attempted today given that he was nauseous -Start normal saline and will give a bolus of normal saline; we will continue IV fluid hydration -Repeat orthostatic  vital signs in the a.m. -Continue with midodrine, Florinef, TED hose, and will order an abdominal binder.  Unfortunately we do not carry droxidopa however we would likely try that in the outpatient setting  Status post PEG tube -Hold feeds now that he continues to Vomit. -We will obtain a calorie count and have consulted nutrition for further evaluation; calorie count has been initiated today and is going be continued tomorrow  Depression  -On Wellbutrin and Lexapro  Normocytic Anemia -Hgb/Hct now is 11.8/34.6 -Check Anemia Panel in the AM -Continue to Monitor for S/Sx of Bleeding; No Overt Bleeding noted -Repeat CBC in AM   Thrombocytopenia -Patient's Platelet Count is now 159,000 and improved -Continue to Monitor and Trend; Currently no overt bleeding noted -Repeat CBC in AM   Abnormal LFTs -Slightly worsening as AST went from 33 -> 42 -> 45 -ALT went from 29 -> 42 -> 52 -Continue to Monitor Carefully and if Necessary will obtain RUQ U/S and Acute Heptatitis Panel -Continue to Monitor and Trend Hepatic Fxn Panel -Repeat CMP in the AM   Hypokalemia -Mild at 3.3 -Replete with po KCl 40 mEQ x2 yesterday; Replete IV KPhos 30 mmol today -Continue to Monitor and Replete as Necessary -Repeat CMP in QAM  Hypophosphatemia -Patient's Phos Level is now 2.1 -Replete with IV KPhos 30 mmol -Continue to Monitor and Repelte as Necessary -Repeat Phos Level in the AM   History of Dementia  -Worsening; Oncology feels he is at his baseline -He continues to complain of being "confused and groggy" -Place on Delirium Precautions -Has a 1:1 Safety Sitter   History of Esophageal Cancer  -followed by Dr. Benay Spice and is being seen inpatient -Appreciate Dr. Gearldine Shown evaluation -EGD in the AM   Goals of Care -Dr. Rodena Piety discussed with patient's wife, has living will at home.   -CODE STATUS changed to DNR which is appropriate for the patient.   -She also expressed a desire to speak  with hospice.  I have discussed with [facility] who will contact her.  DVT prophylaxis: Apparent 40 mg subcu every 24 Code Status: DO NOT RESUSCITATE  Family Communication: Discussed with Wife via the Telephone twice today Disposition Plan: Pending further improvement of his orthostatic hypotension and evaulation and management of his Nausea   Status is: Inpatient  Remains inpatient appropriate because:Ongoing diagnostic testing needed not appropriate for outpatient work up and IV treatments appropriate due to intensity of illness or inability to take PO; Will get a Calorie Count  Dispo: The patient is from: Home              Anticipated d/c is to: SNF              Anticipated d/c date is: 2 days              Patient currently is not medically stable to d/c. given continued Orthostasis    Consultants:   Medical Oncology  Gastroenterology    Procedures:  EGD to be done tomorrow   Antimicrobials:  Anti-infectives (From admission, onward)   Start     Dose/Rate Route Frequency Ordered Stop   08/27/19 0200  cefTRIAXone (ROCEPHIN) 2 g in  sodium chloride 0.9 % 100 mL IVPB     2 g 200 mL/hr over 30 Minutes Intravenous Every 24 hours 08/26/19 0411 09/01/19 0159   08/26/19 0500  azithromycin (ZITHROMAX) 500 mg in sodium chloride 0.9 % 250 mL IVPB     500 mg 250 mL/hr over 60 Minutes Intravenous Every 24 hours 08/26/19 0411 08/30/19 0520   08/26/19 0115  cefTRIAXone (ROCEPHIN) 2 g in sodium chloride 0.9 % 100 mL IVPB     2 g 200 mL/hr over 30 Minutes Intravenous  Once 08/26/19 0106 08/26/19 0244     Subjective: Seen and examined at bedside and he remains pleasantly demented and complaining of doing "okay" and still remains "confused."  Nursing states that he is vomiting this morning.  He has had no more diarrheal episodes and his diarrhea is no longer loose or watery.  He started having coughing episodes this morning.  Denies any other concerns or claims at this  time.  Objective: Vitals:   08/30/19 1415 08/30/19 1815 08/30/19 1818 08/30/19 1820  BP: 138/84 139/81 125/72 109/63  Pulse: 78 87 95 (!) 106  Resp: 18 20 20    Temp: 98.9 F (37.2 C) (!) 97.5 F (36.4 C) 97.6 F (36.4 C)   TempSrc: Oral Oral Oral   SpO2: 99% 100% 100% 99%  Weight:      Height:        Intake/Output Summary (Last 24 hours) at 08/30/2019 1950 Last data filed at 08/30/2019 1744 Gross per 24 hour  Intake 920 ml  Output 1310 ml  Net -390 ml   Filed Weights   08/28/19 0416 08/29/19 0623 08/30/19 0454  Weight: 101.1 kg 100.3 kg 103.4 kg   Examination: Physical Exam:  Constitutional: WN/WD Asian male currently no acute distress appears slight anxious and does appear uncomfortable coughing a little bit Eyes: Lids and conjunctivae normal, sclerae anicteric  ENMT: External Ears, Nose appear normal. Grossly normal hearing.  Neck: Appears normal, supple, no cervical masses, normal ROM, no appreciable thyromegaly; no JVD Respiratory: Diminished to auscultation bilaterally with coarse breath sounds, no wheezing, rales, rhonchi or crackles. Normal respiratory effort and patient is not tachypenic. No accessory muscle use.  Unlabored breathing Cardiovascular: RRR, no murmurs / rubs / gallops. S1 and S2 auscultated.  Trace extremity edema Abdomen: Soft, non-tender, distended secondary to body habitus.Bowel sounds positive.  GU: Deferred. Musculoskeletal: No clubbing / cyanosis of digits/nails. No joint deformity upper and lower extremities.   Skin: No rashes, lesions, ulcers. No induration; Warm and dry.  Neurologic: CN 2-12 grossly intact with no focal deficits. Romberg sign and cerebellar reflexes not assessed.  Psychiatric:.  Judgment and insight. Alert and oriented x 2.  Anxious mood and appropriate affect.   Data Reviewed: I have personally reviewed following labs and imaging studies  CBC: Recent Labs  Lab 08/25/19 2048 08/26/19 0450 08/28/19 0924 08/29/19 0549  08/30/19 0540  WBC 10.6* 7.6 7.4 6.6 6.8  NEUTROABS 8.6*  --  5.8 5.1 5.4  HGB 12.9* 11.5* 12.1* 11.1* 11.8*  HCT 37.5* 34.2* 35.5* 33.6* 34.6*  MCV 83.9 87.2 87.7 88.0 85.6  PLT 173 132* 139* 140* 629   Basic Metabolic Panel: Recent Labs  Lab 08/25/19 2048 08/26/19 0450 08/28/19 0924 08/29/19 0549 08/30/19 0540  NA 131* 135 133* 131* 131*  K 4.0 4.0 3.1* 3.1* 3.3*  CL 99 103 101 98 103  CO2 23 27 22 25 22   GLUCOSE 110* 108* 123* 113* 109*  BUN 21 16 9  6* 5*  CREATININE 1.15 1.20 0.98 0.85 0.84  CALCIUM 10.8* 10.3 9.9 9.9 10.0  MG  --   --  1.9 1.8 2.2  PHOS  --   --  2.1* 3.1 2.1*   GFR: Estimated Creatinine Clearance: 102 mL/min (by C-G formula based on SCr of 0.84 mg/dL). Liver Function Tests: Recent Labs  Lab 08/25/19 2048 08/28/19 0924 08/29/19 0549 08/30/19 0540  AST 25 33 42* 45*  ALT 22 29 42 52*  ALKPHOS 53 51 52 51  BILITOT 1.5* 0.9 0.6 1.0  PROT 6.2* 5.5* 5.3* 5.6*  ALBUMIN 3.3* 2.9* 2.8* 2.9*   No results for input(s): LIPASE, AMYLASE in the last 168 hours. No results for input(s): AMMONIA in the last 168 hours. Coagulation Profile: No results for input(s): INR, PROTIME in the last 168 hours. Cardiac Enzymes: No results for input(s): CKTOTAL, CKMB, CKMBINDEX, TROPONINI in the last 168 hours. BNP (last 3 results) No results for input(s): PROBNP in the last 8760 hours. HbA1C: No results for input(s): HGBA1C in the last 72 hours. CBG: Recent Labs  Lab 08/30/19 0035 08/30/19 0358 08/30/19 0748 08/30/19 1202 08/30/19 1612  GLUCAP 104* 96 102* 159* 120*   Lipid Profile: No results for input(s): CHOL, HDL, LDLCALC, TRIG, CHOLHDL, LDLDIRECT in the last 72 hours. Thyroid Function Tests: No results for input(s): TSH, T4TOTAL, FREET4, T3FREE, THYROIDAB in the last 72 hours. Anemia Panel: No results for input(s): VITAMINB12, FOLATE, FERRITIN, TIBC, IRON, RETICCTPCT in the last 72 hours. Sepsis Labs: Recent Labs  Lab 08/25/19 2355  LATICACIDVEN  0.8    Recent Results (from the past 240 hour(s))  Blood culture (routine x 2)     Status: None (Preliminary result)   Collection Time: 08/25/19 11:00 PM   Specimen: BLOOD  Result Value Ref Range Status   Specimen Description   Final    BLOOD LEFT ANTECUBITAL Performed at [facility] [address], [place], [place] [address]    Special Requests   Final    BOTTLES DRAWN AEROBIC AND ANAEROBIC Blood Culture adequate volume Performed at [facility] [address], [place], [place] [address]    Culture   Final    NO GROWTH 4 DAYS Performed at [facility], [place] [address] [place], [place] [address]    Report Status PENDING  Incomplete  Blood culture (routine x 2)     Status: None (Preliminary result)   Collection Time: 08/25/19 11:04 PM   Specimen: BLOOD  Result Value Ref Range Status   Specimen Description   Final    BLOOD LEFT HAND Performed at [facility] [address] [place], [place] [address]    Special Requests   Final    BOTTLES DRAWN AEROBIC AND ANAEROBIC Blood Culture adequate volume Performed at [facility] [address], [place], [place] [address]    Culture   Final    NO GROWTH 4 DAYS Performed at [facility], [place] [address] [place], [place] [address]    Report Status PENDING  Incomplete  Urine Culture     Status: Abnormal   Collection Time: 08/26/19 12:58 AM   Specimen: Urine, Random  Result Value Ref Range Status   Specimen Description   Final    URINE, RANDOM Performed at [facility] [address] [place], [place] [address]    Special Requests   Final    NONE Performed at [facility], [address] [place], [place] [address]    Culture (A)  Final    <10,000 COLONIES/mL INSIGNIFICANT GROWTH Performed at [facility] [address] [place], [place] [address]    Report Status 08/27/2019 FINAL  Final   SARS Coronavirus 2 by RT PCR (hospital order, performed in [facility] hospital lab) Nasopharyngeal Nasopharyngeal Swab     Status: None   Collection Time: 08/26/19  1:06 AM   Specimen: Nasopharyngeal Swab  Result Value Ref Range Status   SARS Coronavirus 2 NEGATIVE NEGATIVE Final    Comment: (NOTE) SARS-CoV-2 target nucleic acids are NOT DETECTED. The SARS-CoV-2 RNA is generally detectable in upper and lower respiratory specimens during the acute phase of infection. The lowest concentration of SARS-CoV-2 viral copies this assay can detect is 250 copies / mL. A negative result does not preclude SARS-CoV-2 infection and should not be used as the sole basis for treatment or other patient management decisions.  A negative result may occur with improper specimen collection / handling, submission of specimen other than nasopharyngeal swab, presence of viral mutation(s) within the areas targeted by this assay, and inadequate number of viral copies (<250 copies / mL). A negative result must be combined with clinical observations, patient history, and epidemiological information. Fact Sheet for Patients:   StrictlyIdeas.no Fact Sheet for Healthcare Providers: BankingDealers.co.za This test is not yet approved or cleared  by the [place] FDA and has been authorized for detection and/or diagnosis of SARS-CoV-2 by FDA under an Emergency Use Authorization (EUA).  This EUA will remain in effect (meaning this test can be used) for the duration of the COVID-19 declaration under Section 564(b)(1) of the Act, 21 U.S.C. section 360bbb-3(b)(1), unless the authorization is terminated or revoked sooner. Performed at [facility], [place] [address], [place], [place] [address]     RN Pressure Injury Documentation:     Estimated body mass index is 29.27 kg/m as calculated from the following:   Height as of this encounter: 6\' 2"  (1.88  m).   Weight as of this encounter: 103.4 kg.  Malnutrition Type:  Nutrition Problem: Increased nutrient needs Etiology: cancer and cancer related treatments   Malnutrition Characteristics:  Signs/Symptoms: estimated needs   Nutrition Interventions:  Interventions: [place])   Radiology Studies: DG Abd 1 View  Result Date: 08/29/2019 CLINICAL DATA:  Intractable nausea and vomiting. History of esophageal cancer. EXAM: ABDOMEN - 1 VIEW COMPARISON:  08/05/2019 abdominal film and CT scan 08/28/2019 FINDINGS: There is scattered contrast in the right colon and rectum. No findings for obstruction or perforation. IMPRESSION: No findings for bowel obstruction. Electronically Signed   By: Marijo Sanes M.D.   On: 08/29/2019 08:05   CT ABDOMEN PELVIS W CONTRAST  Result Date: 08/28/2019 CLINICAL DATA:  Nausea and vomiting, history of esophageal cancer EXAM: CT ABDOMEN AND PELVIS WITH CONTRAST TECHNIQUE: Multidetector CT imaging of the abdomen and pelvis was performed using the standard protocol following bolus administration of intravenous contrast. CONTRAST:  174mL OMNIPAQUE IOHEXOL 300 MG/ML  SOLN COMPARISON:  05/06/2019, 07/05/2019 FINDINGS: Lower chest: Circumferential wall thickening of the distal esophagus again noted, consistent with known esophageal cancer. Overall, significant improvement since prior PET scan. No acute airspace disease.  Trace right pleural effusion. Hepatobiliary: Minimal gallbladder sludge. No calcified gallstones or acute cholecystitis. Liver is unremarkable. Pancreas: Unremarkable. No pancreatic ductal dilatation or surrounding inflammatory changes. Spleen: Spleen is enlarged measuring 15.8 cm in craniocaudal length, grossly stable. Adrenals/Urinary Tract: There is a nonobstructing  8 mm distal left ureteral calculus. Punctate 3 mm nonobstructing right renal calculus is noted. No obstructive uropathy within either kidney. The kidneys enhance normally and symmetrically. The  adrenals are unremarkable. Bladder is moderately distended without focal abnormality. Stomach/Bowel: No bowel obstruction or ileus. Normal appendix right lower quadrant. Percutaneous gastrostomy tube is seen within the gastric lumen. Vascular/Lymphatic: There is progressive retroperitoneal adenopathy. Left para-aortic adenopathy measures up to 16 mm in short axis reference image 31. The lymphadenopathy at the celiac axis and within the gastrohepatic ligament on prior PET scan are no longer identified. Minimal atherosclerosis of the aorta unchanged. Reproductive: Prostate is mildly enlarged but stable. Other: No abdominal wall hernia or abnormality. No abdominopelvic ascites. Musculoskeletal: No acute or destructive bony lesions. Reconstructed images demonstrate no additional findings. IMPRESSION: 1. Persistent distal esophageal mass consistent with known esophageal cancer. Overall improvement since recent PET scan. 2. Progressive retroperitoneal lymphadenopathy, with new and enlarging lymph nodes as above. The previously seen celiac and gastrohepatic ligament adenopathy has resolved. 3. Stable splenomegaly. 4. Trace right pleural effusion. 5. Stable bilateral urinary tract calculi without obstruction. Electronically Signed   By: Randa Ngo M.D.   On: 08/28/2019 21:15   Scheduled Meds: . buPROPion  150 mg Oral Daily  . enoxaparin (LOVENOX) injection  40 mg Subcutaneous Q24H  . escitalopram  20 mg Oral Daily  . feeding supplement (K[employer]MS STANDARD 1.4)  325 mL Oral BID BM  . fludrocortisone  0.1 mg Oral Daily  . midodrine  10 mg Oral TID WC  . pantoprazole sodium  40 mg Oral BID  . saccharomyces boulardii  250 mg Oral BID  . tamsulosin  0.4 mg Oral QHS  . vitamin B-12  1,000 mcg Oral Daily   Continuous Infusions: . sodium chloride 75 mL/hr at 08/30/19 0903  . sodium chloride    . cefTRIAXone (ROCEPHIN)  IV 2 g (08/30/19 0223)    LOS: 4 days   Kerney Elbe, DO [facility] PAGER is on [place]  If 7PM-7AM, please contact night-coverage [email or web address]

## 2019-08-30 NOTE — Progress Notes (Signed)
Nutrition Follow-up  INTERVENTION:   -Calorie Count per MD 5/21 & 5/22  -[employer] 1.4 BID PO, each provides 455 kcals and 20g protein  NUTRITION DIAGNOSIS:   Increased nutrient needs related to cancer and cancer related treatments as evidenced by estimated needs.  Ongoing.  GOAL:   Patient will meet greater than or equal to 90% of their needs  Not meeting.  MONITOR:   PO intake, supplemental acceptance, I & O's, Labs, Weight trends  REASON FOR ASSESSMENT:   Consult Assessment of nutrition requirement/status, Calorie Count  ASSESSMENT:   74 y.o. male with history of esophageal cancer PEG tube placement BPH dementia depression who was recently admitted for syncope on August 06, 2019 discharged on Aug 13, 2019 was observed to be increasingly weak over the last 4 to 5 days and getting more confused.  Patient also had some vomiting and patient's oncologist Dr. Learta Codding advised to stop the enteral feeds for 2 days.  And it was restarted again 2 days ago following which later in the evening patient had some vomiting patient wife also noticed some fevers around around 99 degrees.  **RD working remotely**  Patient continues to have loose stools. Pt receiving IV Rocephin and IV Zythromax. Pt having some emesis as well. TF has been off since 5/19. Pt mainly consuming PO. Per MD note, calorie count ordered to assess PO intakes. Will order [employer] as PO supplement to provide additional protein and kcals.  Per PO documentation on 5/20: B: 50% consumed (~450 kcals and 12g protein) L: 0% consumed D: nothing documented  Admission weight: 213 lbs. Current weight: 228 lbs.  I/Os: -381 ml since admit UOP: 2.9L x 24 hrs  Medications: Florastor, Vitamin B-12, Zofran, K-Phos infusion Labs reviewed: CBGs: 96-102 Low Na, K, Phos Mg WNL  Diet Order:   Diet Order            Diet regular Room service appropriate? Yes; Fluid consistency: Thin  Diet effective now               EDUCATION NEEDS:   No education needs have been identified at this time  Skin:  Skin Assessment: Reviewed RN Assessment  Last BM:  5/20- type 7  Height:   Ht Readings from Last 1 Encounters:  08/25/19 6\' 2"  (1.88 m)    Weight:   Wt Readings from Last 1 Encounters:  08/30/19 103.4 kg    Ideal Body Weight:     BMI:  Body mass index is 29.27 kg/m.  Estimated Nutritional Needs:   Kcal:  2300-2500  Protein:  115-130g  Fluid:  2.3L/day  Clayton Bibles, MS, RD, LDN Inpatient Clinical Dietitian Contact information available via Amion

## 2019-08-30 NOTE — Evaluation (Signed)
Occupational Therapy Evaluation Patient Details Name: David [name] MRN: [id number] DOB: 14-Apr-1945 Today's Date: 08/30/2019    History of Present Illness 74 y.o. male with medical history significant of esophageal cancer on palliative chemotherapy, hypertension, dementia, hyperlipidemia, BPH and anxiety disorder and admitted with AMS/fever, found to have UTI and possible PNA .  Pt with recent admission for presyncope likely secondary to orthostasis.   Clinical Impression   [name] is a 74 year old man admitted to hospital with acute encephalopathy who presents with normal ROM and strength of upper extremities, bilateral hand tremors, RLE tremor, complaints of dizziness and impaired activity tolerance with standing resulting in impaired ability to perform baseline ambulation and ADLs. Patient has a history of chronic dizziness. Patient will benefit from skilled OT services in order to improve participation and safety in functional mobility and ADLs. Patient would benefit from either 24/7 assistance at home or short term rehab at discharge.     Follow Up Recommendations  SNF;Supervision/Assistance - 24 hour    Equipment Recommendations  None recommended by OT    Recommendations for Other Services       Precautions / Restrictions Precautions Precaution Comments: G tube, orthostatic, Patient fell in bathroom, safety sittin in room. Restrictions Weight Bearing Restrictions: No      Mobility Bed Mobility   Bed Mobility: Supine to Sit;Sit to Supine       Sit to supine: Supervision;HOB elevated   General bed mobility comments: Patient able to transfer to side of the bed using bed rail. Ambulat  Transfers                 General transfer comment: Ambulation limited today due to complaints of dizziness and in abundance of caution due to recent fall. Min guard for safety. Tremor noted in patient's RLE. Patient reports increased dizziness with standing and fatigue  after limited activity. Patinet returned to bed with supervision.    Balance Overall balance assessment: Needs assistance Sitting-balance support: Feet supported Sitting balance-Leahy Scale: Good     Standing balance support: No upper extremity supported Standing balance-Leahy Scale: Fair                             ADL either performed or assessed with clinical judgement   ADL   Eating/Feeding: Independent   Grooming: Sitting;Set up;Oral care;Wash/dry face;Wash/dry hands   Upper Body Bathing: Set up;Sitting   Lower Body Bathing: Set up;Supervison/ safety   Upper Body Dressing : Set up;Supervision/safety   Lower Body Dressing: Set up;Supervision/safety;Min guard;Sit to/from stand Lower Body Dressing Details (indicate cue type and reason): Patient donned boxers sitting at side of bed. cga provided due to patinet's history of falls and chronic dizziness.               General ADL Comments: Did not perform ambulation to bathroom or toileting. Patient cga/min guard for all standing and ambulation (by the side of bed) for safety due to chronic complaints of dizziness and hx of falls.     Vision Baseline Vision/History: Wears glasses Wears Glasses: Reading only Vision Assessment?: No apparent visual deficits     Perception     Praxis      Pertinent Vitals/Pain Pain Assessment: No/denies pain     Hand Dominance     Extremity/Trunk Assessment Upper Extremity Assessment Upper Extremity Assessment: RUE deficits/detail;LUE deficits/detail RUE Deficits / Details: Good strength in RUE. Tremor noted in right hand. Reports  tremors have been present. LUE Deficits / Details: Good strength in LUE. Tremor noted in left hand. Patient reports tremors have been present.   Lower Extremity Assessment Lower Extremity Assessment: RLE deficits/detail RLE Deficits / Details: Tremor noted in RLE.       Communication Communication Communication: No difficulties    Cognition Arousal/Alertness: Awake/alert Behavior During Therapy: WFL for tasks assessed/performed Overall Cognitive Status: History of cognitive impairments - at baseline                                 General Comments: Patient has history of dementia. He reports he is at [facility]. Patient pleasant and agreeable to activity. Follows all commands. Questionable insight into deficits.   General Comments       Exercises     Shoulder Instructions      Home Living Family/patient expects to be discharged to:: Private residence Living Arrangements: Alone Available Help at Discharge: Family;Available PRN/intermittently Type of Home: Other(Comment)(Condo) Home Access: Stairs to enter Entrance Stairs-Number of Steps: 37m floor unit   Home Layout: One level     Bathroom Shower/Tub: Tub/shower unit;Walk-in shower   Bathroom Toilet: Standard Bathroom Accessibility: Yes   Home Equipment: None   Additional Comments: Reports his wife - who he is separated from - assists him with keeping things in order and managing his appointments.      Prior Functioning/Environment Level of Independence: Independent        Comments: Reports independence with adls and ambulation and not using a device to ambulate.  Per case management note patient getting in home personal care services Mon - Thurs from 9 - 1.        OT Problem List: Decreased activity tolerance;Decreased safety awareness;Impaired balance (sitting and/or standing)      OT Treatment/Interventions: Self-care/ADL training;Therapeutic activities;Patient/family education    OT Goals(Current goals can be found in the care plan section) Acute Rehab OT Goals Patient Stated Goal: did not state Time For Goal Achievement: 09/13/19 Potential to Achieve Goals: Fair  OT Frequency: Min 2X/week   Barriers to D/C:            Co-evaluation              AM-PAC OT "6 Clicks" Daily Activity     Outcome  Measure Help from another person eating meals?: None Help from another person taking care of personal grooming?: A Little Help from another person toileting, which includes using toliet, bedpan, or urinal?: A Little Help from another person bathing (including washing, rinsing, drying)?: A Little Help from another person to put on and taking off regular upper body clothing?: A Little Help from another person to put on and taking off regular lower body clothing?: A Little 6 Click Score: 19   End of Session Nurse Communication: Mobility status  Activity Tolerance: Patient limited by fatigue;Other (comment)(dizziness) Patient left: in chair;with nursing/sitter in room;with call bell/phone within reach;with bed alarm set  OT Visit Diagnosis: Other symptoms and signs involving cognitive function;History of falling (Z91.81);Repeated falls (R29.6);Dizziness and giddiness (R42)                Time: [address]-9629OT Time Calculation (min): 16 min Charges:  OT General Charges $OT Visit: 1 Visit OT Evaluation $OT Eval Low Complexity: 1 Low  [place], OTR/L [place] Office [phone]  VLenward Chancellor5/21/2021, 1:05 PM

## 2019-08-30 NOTE — Evaluation (Signed)
Clinical/Bedside Swallow Evaluation Patient Details  Name: David [name] MRN: [id number] Date of Birth: Aug 19, 1945  Today's Date: 08/30/2019 Time: SLP Start Time (ACUTE ONLY): 0954 SLP Stop Time (ACUTE ONLY): 1030 SLP Time Calculation (min) (ACUTE ONLY): 36 min  Past Medical History:  Past Medical History:  Diagnosis Date  . Anxiety   . BPH (benign prostatic hyperplasia)   . Cancer ([place])   . Dementia ([place])   . Depression   . Hyperlipidemia   . Hypertension   . Kidney stone   . Memory loss   . 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 Sleep apnea    CPAP  . Vitamin D deficiency    Past Surgical History:  Past Surgical History:  Procedure Laterality Date  . CATARACT EXTRACTION, BILATERAL  2018  . EXTERNAL EAR SURGERY     child  . EYE SURGERY Bilateral    cataract  . IR GASTROSTOMY TUBE MOD SED  07/08/2019  . right knee meniscus  06/12/2008  . right talus repair     dislocated   HPI:  74yo male admitted 08/25/19 with confusion, fever, and weakness. PMH: anxiety, HLD, HTN, kidney stone, narcissistic personality disorder, OSA, esophageal cancer, PEG tube, BPH, dementia, depression. adm 4/27-08/13/2019. CXR = streaky right infrahilar opacities - atelectasis vs aspiration, right paratracheal adenopathy.   Assessment / Plan / Recommendation Clinical Impression  Pt presents with adequate natural dentition, functional oral motor strength and ROM. Pt reports no difficulty with food/liquid going down - the difficulty is with it STAYING down, due to esophageal dysphagia. Pt accepted trials of thin liquid, puree, and solid textures. No overt s/s aspiration observed following any trial, however, silent aspiration cannot be detected at bedside. Pt was seen in March for BSE, with similar presentation. Pt primary risk for aspiration is likely related to esophageal issues. Recommend continuing with regular diet and thin liquids to allow pt full range of choice, as pt is aware of  textures he can handle, and those that are problematic. MD arrived to consult with pt - further esophageal workup is being planned. SLP will follow up once esophageal study results are available to continue education with pt.    SLP Visit Diagnosis: Dysphagia, unspecified (R13.10)    Aspiration Risk  Moderate aspiration risk    Diet Recommendation Regular;Thin liquid   Liquid Administration via: Straw;Cup Medication Administration: (as tolerated - PEG or PO per pt preference) Supervision: Patient able to self feed Compensations: Slow rate;Small sips/bites Postural Changes: Seated upright at 90 degrees;Remain upright for at least 30 minutes after po intake    Other  Recommendations Oral Care Recommendations: Oral care BID   Follow up Recommendations None      Frequency and Duration min 1 x/week  1 week;2 weeks       Prognosis Prognosis for Safe Diet Advancement: Good      Swallow Study   General Date of Onset: 08/25/19 HPI: 74yo male admitted 08/25/19 with confusion, fever, and weakness. PMH: anxiety, HLD, HTN, kidney stone, narcissistic personality disorder, OSA, esophageal cancer, PEG tube, BPH, dementia, depression. adm 4/27-08/13/2019. CXR = streaky right infrahilar opacities - atelectasis vs aspiration, right paratracheal adenopathy. Type of Study: Bedside Swallow Evaluation Previous Swallow Assessment: BSE 07/06/2019 = nons/s aspiration puree/thin; increased risk for aspiration post-prandially secondary to esophageal dysfunction Diet Prior to this Study: Regular;Thin liquids Temperature Spikes Noted: No Respiratory Status: Room air History of Recent Intubation: No Behavior/Cognition: Alert;Cooperative;Pleasant mood Oral Cavity Assessment: Within Functional Limits Oral Care Completed by  SLP: No Oral Cavity - Dentition: Adequate natural dentition Vision: Functional for self-feeding Self-Feeding Abilities: Able to feed self Patient Positioning: Upright in bed Baseline  Vocal Quality: Low vocal intensity Volitional Cough: Weak Volitional Swallow: Able to elicit    Oral/Motor/Sensory Function Overall Oral Motor/Sensory Function: Within functional limits   Ice Chips Ice chips: Not tested   Thin Liquid Thin Liquid: Within functional limits Presentation: Straw    Nectar Thick Nectar Thick Liquid: Not tested   Honey Thick Honey Thick Liquid: Not tested   Puree Puree: Within functional limits Presentation: Self Fed;Spoon   Solid     Solid: Within functional limits Presentation: [place] [facility], [facility], [place] Speech Language Pathologist Office: [phone] Pager: [phone]  Shonna Chock 08/30/2019,10:41 AM

## 2019-08-30 NOTE — Progress Notes (Addendum)
Patient seen and examined and discussed with the hospital team multiple times and discussed with our PA multiple times please see her note for details and he is familiar to our service and his hospital computer chart reviewed and his case discussed with speech therapy as well and he actually has no complaints now and it is hard to know if he is truly vomiting versus just spitting up a little and I think we should proceed with an endoscopy to reevaluate his esophagus and help delineate what exactly he should be eating and if significant radiation damage consider liquids only and supplement with PEG tube feeds and will ask my partner Dr. Penelope Coop to proceed tomorrow

## 2019-08-31 ENCOUNTER — Encounter (HOSPITAL_COMMUNITY)
Admission: EM | Disposition: A | Discharge status: Skilled Nursing Facility | Payer: Self-pay | Source: Home / Self Care | Attending: Internal Medicine

## 2019-08-31 ENCOUNTER — Inpatient Hospital Stay (HOSPITAL_COMMUNITY): Payer: Medicare Other | Admitting: Certified Registered Nurse Anesthetist

## 2019-08-31 ENCOUNTER — Inpatient Hospital Stay (HOSPITAL_COMMUNITY): Payer: Medicare Other

## 2019-08-31 DIAGNOSIS — K209 Esophagitis, unspecified without bleeding: Secondary | ICD-10-CM

## 2019-08-31 HISTORY — PX: ESOPHAGOGASTRODUODENOSCOPY: SHX5428

## 2019-08-31 HISTORY — PX: BIOPSY: SHX5522

## 2019-08-31 LAB — COMPREHENSIVE METABOLIC PANEL
ALT: 47 U/L — ABNORMAL HIGH (ref 0–44)
AST: 34 U/L (ref 15–41)
Albumin: 2.9 g/dL — ABNORMAL LOW (ref 3.5–5.0)
Alkaline Phosphatase: 51 U/L (ref 38–126)
Anion gap: 7 (ref 5–15)
BUN: 6 mg/dL — ABNORMAL LOW (ref 8–23)
CO2: 22 mmol/L (ref 22–32)
Calcium: 10.2 mg/dL (ref 8.9–10.3)
Chloride: 104 mmol/L (ref 98–111)
Creatinine, Ser: 0.96 mg/dL (ref 0.61–1.24)
GFR calc Af Amer: >60 mL/min (ref >60)
GFR calc non Af Amer: >60 mL/min (ref >60)
Glucose, Bld: 128 mg/dL — ABNORMAL HIGH (ref 70–99)
Potassium: 3.3 mmol/L — ABNORMAL LOW (ref 3.5–5.1)
Sodium: 133 mmol/L — ABNORMAL LOW (ref 135–145)
Total Bilirubin: 0.8 mg/dL (ref 0.3–1.2)
Total Protein: 5.5 g/dL — ABNORMAL LOW (ref 6.5–8.1)

## 2019-08-31 LAB — RETICULOCYTES
Immature Retic Fract: 18.5 % — ABNORMAL HIGH (ref 2.3–15.9)
RBC.: 4.12 MIL/uL — ABNORMAL LOW (ref 4.22–5.81)
Retic Count, Absolute: 119.9 10*3/uL (ref 19.0–186.0)
Retic Ct Pct: 2.9 % (ref 0.4–3.1)

## 2019-08-31 LAB — IRON AND TIBC
Iron: 29 ug/dL — ABNORMAL LOW (ref 45–182)
Saturation Ratios: 11 % — ABNORMAL LOW (ref 17.9–39.5)
TIBC: 265 ug/dL (ref 250–450)
UIBC: 236 ug/dL

## 2019-08-31 LAB — GLUCOSE, CAPILLARY
Glucose-Capillary: 121 mg/dL — ABNORMAL HIGH (ref 70–99)
Glucose-Capillary: 122 mg/dL — ABNORMAL HIGH (ref 70–99)
Glucose-Capillary: 127 mg/dL — ABNORMAL HIGH (ref 70–99)
Glucose-Capillary: 114 mg/dL — ABNORMAL HIGH (ref 70–99)
Glucose-Capillary: 110 mg/dL — ABNORMAL HIGH (ref 70–99)
Glucose-Capillary: 112 mg/dL — ABNORMAL HIGH (ref 70–99)

## 2019-08-31 LAB — CBC WITH DIFFERENTIAL/PLATELET
Abs Immature Granulocytes: 0.1 10*3/uL — ABNORMAL HIGH (ref 0.00–0.07)
Basophils Absolute: 0 10*3/uL (ref 0.0–0.1)
Basophils Relative: 0 %
Eosinophils Absolute: 0.1 10*3/uL (ref 0.0–0.5)
Eosinophils Relative: 1 %
HCT: 36.6 % — ABNORMAL LOW (ref 39.0–52.0)
Hemoglobin: 12.1 g/dL — ABNORMAL LOW (ref 13.0–17.0)
Immature Granulocytes: 1 %
Lymphocytes Relative: 10 %
Lymphs Abs: 0.9 10*3/uL (ref 0.7–4.0)
MCH: 28.8 pg (ref 26.0–34.0)
MCHC: 33.1 g/dL (ref 30.0–36.0)
MCV: 87.1 fL (ref 80.0–100.0)
Monocytes Absolute: 0.5 10*3/uL (ref 0.1–1.0)
Monocytes Relative: 6 %
Neutro Abs: 7.7 10*3/uL (ref 1.7–7.7)
Neutrophils Relative %: 82 %
Platelets: 160 10*3/uL (ref 150–400)
RBC: 4.2 MIL/uL — ABNORMAL LOW (ref 4.22–5.81)
RDW: 13.7 % (ref 11.5–15.5)
WBC: 9.3 10*3/uL (ref 4.0–10.5)
nRBC: 0 % (ref 0.0–0.2)

## 2019-08-31 LAB — CULTURE, BLOOD (ROUTINE X 2)
Culture: NO GROWTH
Culture: NO GROWTH
Special Requests: ADEQUATE
Special Requests: ADEQUATE

## 2019-08-31 LAB — FERRITIN: Ferritin: 225 ng/mL (ref 24–336)

## 2019-08-31 LAB — VITAMIN B12: Vitamin B-12: 1478 pg/mL — ABNORMAL HIGH (ref 180–914)

## 2019-08-31 LAB — PHOSPHORUS: Phosphorus: 3.3 mg/dL (ref 2.5–4.6)

## 2019-08-31 LAB — MAGNESIUM: Magnesium: 2 mg/dL (ref 1.7–2.4)

## 2019-08-31 LAB — FOLATE: Folate: 5.9 ng/mL — ABNORMAL LOW (ref >5.9)

## 2019-08-31 SURGERY — EGD (ESOPHAGOGASTRODUODENOSCOPY)
Anesthesia: Monitor Anesthesia Care

## 2019-08-31 MED ORDER — SUCRALFATE 1 GM/10ML PO SUSP
1.0000 g | Freq: Three times a day (TID) | ORAL | Status: DC
  Administered 2019-08-31 – 2019-09-10 (×41): 1 g via ORAL
  Filled 2019-08-31 (×41): qty 10

## 2019-08-31 MED ORDER — LACTATED RINGERS IV SOLN
INTRAVENOUS | Status: AC | PRN
  Administered 2019-08-31: 1000 mL via INTRAVENOUS

## 2019-08-31 MED ORDER — POTASSIUM CHLORIDE 10 MEQ/100ML IV SOLN
10.0000 meq | INTRAVENOUS | Status: AC
  Administered 2019-08-31 (×4): 10 meq via INTRAVENOUS
  Filled 2019-08-31 (×4): qty 100

## 2019-08-31 MED ORDER — PROPOFOL 500 MG/50ML IV EMUL
INTRAVENOUS | Status: DC | PRN
  Administered 2019-08-31: 125 ug/kg/min via INTRAVENOUS

## 2019-08-31 MED ORDER — PROPOFOL 500 MG/50ML IV EMUL
INTRAVENOUS | Status: AC
  Filled 2019-08-31: qty 50

## 2019-08-31 MED ORDER — POTASSIUM CHLORIDE CRYS ER 20 MEQ PO TBCR: 40.0000 meq | EXTENDED_RELEASE_TABLET | Freq: Two times a day (BID) | ORAL | Status: DC

## 2019-08-31 MED ORDER — SODIUM PHOSPHATES 45 MMOLE/15ML IV SOLN
30.0000 mmol | Freq: Once | INTRAVENOUS | Status: AC
  Administered 2019-08-31: 30 mmol via INTRAVENOUS
  Filled 2019-08-31: qty 10

## 2019-08-31 MED ORDER — POLYSACCHARIDE IRON COMPLEX 150 MG PO CAPS
150.0000 mg | ORAL_CAPSULE | Freq: Every day | ORAL | Status: DC
  Administered 2019-08-31 – 2019-09-10 (×11): 150 mg via ORAL
  Filled 2019-08-31 (×11): qty 1

## 2019-08-31 MED ORDER — PROPOFOL 10 MG/ML IV BOLUS
INTRAVENOUS | Status: DC | PRN
  Administered 2019-08-31: 20 mg via INTRAVENOUS

## 2019-08-31 NOTE — Plan of Care (Signed)
  Problem: Health Behavior/Discharge Planning: Goal: Ability to manage health-related needs will improve Outcome: Not Progressing Problem: Health Behavior/Discharge Planning: Goal: Ability to manage health-related needs will improve Outcome: Not Progressing   Problem: Clinical Measurements: Goal: Ability to maintain clinical measurements within normal limits will improve Outcome: Progressing   Problem: Activity: Goal: Risk for activity intolerance will decrease Outcome: Progressing   Problem: Coping: Goal: Level of anxiety will decrease Outcome: Progressing   Problem: Coping: Goal: Level of anxiety will decrease Outcome: Progressing

## 2019-08-31 NOTE — Progress Notes (Signed)
PROGRESS NOTE    David [name]  VXB:[id number] DOB: April 26, 1945 DOA: 08/25/2019 PCP: Leighton Ruff, MD   Brief Narrative:  HPI per Dr. Gean Birchwood on 08/25/19 David [name] is a 74 y.o. male with history of esophageal cancer PEG tube placement BPH dementia depression who was recently admitted for syncope on August 06, 2019 discharged on Aug 13, 2019 was observed to be increasingly weak over the last 4 to 5 days and getting more confused.  Patient also had some vomiting and patient's oncologist Dr. Learta Codding advised to stop the enteral feeds for 2 days.  And it was restarted again 2 days ago following which later in the evening patient had some vomiting patient wife also noticed some fevers around around 99 degrees.  Patient also has very frequent urination.  Did not complain of any pain or any shortness of breath.  Patient also has been having dizziness when he stands up.  Has some cough nonproductive.  Given these changes patient was brought to the ER.  ED Course: In the ER patient was alert awake oriented to his name and place moving all extremities.  CT head was unremarkable.  Labs show WBC of 10.6 hemoglobin 12.9 calcium was 10.8 sodium 131 lactic acid 0.8 EKG normal sinus rhythm.  Covid test was negative.  UA was concerning for UTI and chest x-ray shows possible infiltrates.  Cultures were obtained and patient started on antibiotics and admitted for further observation.  **Interim History  Continued to have some nausea and vomiting. CT abdomen pelvis was unremarkable and KUB was unremarkable as well. Nausea and vomiting had improved but worsened again so GI was consulted for further evaluation In the interim we will obtain a calorie count for 48hours to see how the patient adequately takes his nutrition.  Medical oncology is consulted, and continue to hold tube feedings and diet as tolerated.  We will continue IV fluids for now.  Patient still continues to be orthostatic and repeat  orthostatic vital signs show that he is still orthostatic so will try an abdominal binder and also give the patient a 1 L fluid bolus in addition to continuing IV fluid hydration.  ED there is esophagitis cells found and this was ulcerative and circumferential and biopsies were taken with a cold forceps.  PEG tube looks good per GI and there is no obvious esophageal mass noted on the EGD this time.  GI recommended starting the patient on clear liquid diet and resuming tube feedings and starting PPI and Carafate slurry.  We will continue fluid hydration and will continue to monitor his orthostatics carefully.  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  Patient admitted with cough and confusion.   -Urine culture shows no growth.  Patient still confused though better than yesterday per staff however oncology feels that he is at his baseline given that he does have dementia.   -On Rocephin and azithromycin.  Today is day 6 of antibiotics -Repeat CXR this AM showed "Normal heart size. Convexity of the upper right mediastinum from adenopathy by PET CT. There is no edema, consolidation, effusion, or pneumothorax."  Nausea and Vomiting in the setting of Esophagitis  -Persistent but has now improved yesterday but worsened again  so GI was consulted as below -C/w Antiemetics -Check CT Abd/Pelvis w/ Contrast and showed persistent distal esophageal mass consistent with known esophageal cancer and also did show progressive retroperitoneal lymphadenopathy with new enlarging lymph nodes; the previous celiac and gastrohepatic ligament adenopathy had now resolved and he does have some stable splenomegaly  as well as a trace right pleural effusion.  Patient also did have some stable bilateral urinary tract calculi without obstruction -KUB was unremarkable -We will have GI formally consult since he continued to vomit today and yesterday and they are going to pursue endoscopy tomorrow to evaluate his esophagus and help with diet recommendations based on the radiation damage to his residual cancer -He underwent an EGD which showed esophagitis to 35 to 45 cm from the incisors and was ulcerative and circumferential Plan GI recommending clear liquid diet and advancing as tolerated as well as Carafate slurry and continue PPI.  Dr. Penelope Coop believes it is from the radiation and thinks that it will take some time to heal.  He recommends resuming tube feedings and will do so slowly  Orthostatic Hypotension -Patient is profoundly orthostatic yesterday and symptomatic and was not attempted today given that he went for an EGD -Start normal saline and will give a bolus of normal saline; we will continue IV fluid hydration -Repeat orthostatic vital signs in the a.m. -Continue with midodrine, Florinef, TED hose, and will order an abdominal binder.  Unfortunately we do not carry droxidopa however we would likely try that in the outpatient setting -We will need to monitor carefully  Status post PEG tube -Hold feeds now that he continues to Vomit but the vomiting is likely from his esophagitis. -We will obtain a calorie count and have consulted nutrition for further evaluation; calorie count has been initiated yesterday and will be continued through today -Resume PEG feedings slowly and will consult Nutrition for re-evaluation  Depression  -On Wellbutrin and Lexapro  Normocytic Anemia -Hgb/Hct now is 12.1/36.6 -Check Anemia Panel and showed an iron level of 29, U IBC of 236, TIBC 265, saturation ratios of 11%, ferritin of 225, folate level 5.9, vitamin B12 1478 -Start Iron supplementation with Niferex 150 mg po  Daily  -Continue to Monitor for S/Sx of Bleeding; No Overt Bleeding noted -Repeat CBC in AM   Thrombocytopenia -Patient's Platelet Count is now 160,000 and improved -Continue to Monitor and Trend; Currently no overt bleeding noted -Repeat CBC in AM   Abnormal LFTs -Slightly worsening as AST went from 33 -> 42 -> 45 -> 34 -ALT went from 29 -> 42 -> 52 -> 47 -Continue to Monitor Carefully and if Necessary will obtain RUQ U/S and Acute Heptatitis Panel -Continue to Monitor and Trend Hepatic Fxn Panel -Repeat CMP in the AM   Hypokalemia -Mild at 3.3 -Replete with IV potassium chloride 40 mEq x 1 -Continue to Monitor and Replete as Necessary -Repeat CMP in QAM  Hypophosphatemia -Patient's Phos Level is now 3.3 -Replete with IV sodium phosphate 30 mmol -Continue to Monitor and Repelte as Necessary -Repeat Phos Level in the AM   Hyponatremia -Continue with IV fluid hydration with normal saline as well as repletion with IV sodium phosphate 30 mmol -Continue to monitor and replete as necessary -Repeat CMP in the a.m.  History of Dementia  -Worsening; Oncology feels he is at his baseline -He continues to complain of being "confused and groggy" -Place on Delirium Precautions -Has a 1:1 Safety Sitter   History of Esophageal Cancer  -followed by Dr.  Sherrill and is being seen inpatient -Appreciate Dr. Gearldine Shown evaluation -EGD this a.m. showed no esophageal mass noted  Goals of Care -Dr. Rodena Piety discussed with patient's wife, has living will at home.   -CODE STATUS changed to DNR which is appropriate for the patient.   -She also expressed a desire to speak with hospice.  I have discussed with [facility] who will contact her.  DVT prophylaxis: Apparent 40 mg subcu every 24 Code Status: DO NOT RESUSCITATE  Family Communication: Discussed with Wife at bedside Disposition Plan: Pending further improvement of his orthostatic hypotension and evaulation and management of  his Nausea   Status is: Inpatient  Remains inpatient appropriate because:Ongoing diagnostic testing needed not appropriate for outpatient work up and IV treatments appropriate due to intensity of illness or inability to take PO; Will get a Calorie Count  Dispo: The patient is from: Home              Anticipated d/c is to: SNF              Anticipated d/c date is: 2 days              Patient currently is not medically stable to d/c. given continued Orthostasis    Consultants:   Medical Oncology  Gastroenterology    Procedures:  EGD     Esophagitis was found 35 to 45 cm from the incisors. This was ulcerative       and circumferential. Biopsies were taken with a cold forceps for       histology. There was no obvious mass seen. No stricture.      No gross lesions were noted in the entire examined stomach. There is a       PEG TUBE which looks good.      The examined duodenum was normal. Impression:               - Esophagitis. Biopsied. NO OBVIOUS ESOPHAGEAL MASS                           - No gross lesions in the stomach.                           - Normal examined duodenum.   Antimicrobials:  Anti-infectives (From admission, onward)   Start     Dose/Rate Route Frequency Ordered Stop   08/27/19 0200  cefTRIAXone (ROCEPHIN) 2 g in sodium chloride 0.9 % 100 mL IVPB     2 g 200 mL/hr over 30 Minutes Intravenous Every 24 hours 08/26/19 0411 08/31/19 0233   08/26/19 0500  azithromycin (ZITHROMAX) 500 mg in sodium chloride 0.9 % 250 mL IVPB     500 mg 250 mL/hr over 60 Minutes Intravenous Every 24 hours 08/26/19 0411 08/30/19 0520   08/26/19 0115  cefTRIAXone (ROCEPHIN) 2 g in sodium chloride 0.9 % 100 mL IVPB     2 g 200 mL/hr over 30 Minutes Intravenous  Once 08/26/19 0106 08/26/19 0244     Subjective: Seen and examined at bedside and he remains pleasantly demented but was more interactive states his wife is at bedside.  He knew who his wife was and who the president was and  what year and month we were in.  He felt a little drowsy.  Had nausea and vomiting early this morning.  No other concerns or plans at this time.  Objective: Vitals:  08/31/19 0919 08/31/19 1028 08/31/19 1040 08/31/19 1323  BP: 131/69 (!) 156/80 (!) 160/82 127/81  Pulse: 89 82 81 73  Resp: 15 18 16 18   Temp: 98.7 F (37.1 C) 98.7 F (37.1 C)  98.3 F (36.8 C)  TempSrc: Axillary Axillary  Oral  SpO2: 99% 100% 100% 97%  Weight: 103.4 kg     Height:        Intake/Output Summary (Last 24 hours) at 08/31/2019 1619 Last data filed at 08/31/2019 1604 Gross per 24 hour  Intake 1488 ml  Output 750 ml  Net 738 ml   Filed Weights   08/29/19 0623 08/30/19 0454 08/31/19 0919  Weight: 100.3 kg 103.4 kg 103.4 kg   Examination: Physical Exam:  Constitutional: WN/WD Caucasian male currently in NAD and is just come back from his EGD appears calm and resting. Eyes: Lids and conjunctivae normal; sclerae anicteric  ENMT: External Ears, Nose appear normal. Grossly normal hearing.  Neck: Appears normal, supple, no cervical masses, normal ROM, no appreciable thyromegaly; no JVD Respiratory: Clear to auscultation bilaterally, no wheezing, rales, rhonchi or crackles. Normal respiratory effort and patient is not tachypenic. No accessory muscle use.  Supplemental oxygen via nasal cannula as he had just come back from his EGD Cardiovascular: RRR, no murmurs / rubs / gallops. S1 and S2 auscultated.  Minimal extremity edema.  Abdomen: Soft, non-tender, distended secondary body habitus.  PEG tube is in place. Bowel sounds positive.  GU: Deferred. Musculoskeletal: No clubbing / cyanosis of digits/nails. No joint deformity upper and lower extremities.  Skin: No rashes, lesions, ulcers. No induration; Warm and dry.  Neurologic: CN 2-12 grossly intact with no focal deficits. Romberg sign and cerebellar reflexes not assessed.  Psychiatric: Normal judgment and insight. Alert and oriented x 3. Pleasant mood and  appropriate affect.   Data Reviewed: I have personally reviewed following labs and imaging studies  CBC: Recent Labs  Lab 08/25/19 2048 08/25/19 2048 08/26/19 0450 08/28/19 0924 08/29/19 0549 08/30/19 0540 08/31/19 0539  WBC 10.6*   < > 7.6 7.4 6.6 6.8 9.3  NEUTROABS 8.6*  --   --  5.8 5.1 5.4 7.7  HGB 12.9*   < > 11.5* 12.1* 11.1* 11.8* 12.1*  HCT 37.5*   < > 34.2* 35.5* 33.6* 34.6* 36.6*  MCV 83.9   < > 87.2 87.7 88.0 85.6 87.1  PLT 173   < > 132* 139* 140* 159 160   < > = values in this interval not displayed.   Basic Metabolic Panel: Recent Labs  Lab 08/26/19 0450 08/28/19 0924 08/29/19 0549 08/30/19 0540 08/31/19 0539  NA 135 133* 131* 131* 133*  K 4.0 3.1* 3.1* 3.3* 3.3*  CL 103 101 98 103 104  CO2 27 22 25 22 22   GLUCOSE 108* 123* 113* 109* 128*  BUN 16 9 6* 5* 6*  CREATININE 1.20 0.98 0.85 0.84 0.96  CALCIUM 10.3 9.9 9.9 10.0 10.2  MG  --  1.9 1.8 2.2 2.0  PHOS  --  2.1* 3.1 2.1* 3.3   GFR: Estimated Creatinine Clearance: 89.2 mL/min (by C-G formula based on SCr of 0.96 mg/dL). Liver Function Tests: Recent Labs  Lab 08/25/19 2048 08/28/19 0924 08/29/19 0549 08/30/19 0540 08/31/19 0539  AST 25 33 42* 45* 34  ALT 22 29 42 52* 47*  ALKPHOS 53 51 52 51 51  BILITOT 1.5* 0.9 0.6 1.0 0.8  PROT 6.2* 5.5* 5.3* 5.6* 5.5*  ALBUMIN 3.3* 2.9* 2.8* 2.9* 2.9*   No  results for input(s): LIPASE, AMYLASE in the last 168 hours. No results for input(s): AMMONIA in the last 168 hours. Coagulation Profile: No results for input(s): INR, PROTIME in the last 168 hours. Cardiac Enzymes: No results for input(s): CKTOTAL, CKMB, CKMBINDEX, TROPONINI in the last 168 hours. BNP (last 3 results) No results for input(s): PROBNP in the last 8760 hours. HbA1C: No results for input(s): HGBA1C in the last 72 hours. CBG: Recent Labs  Lab 08/30/19 2000 08/31/19 0154 08/31/19 0410 08/31/19 0803 08/31/19 1129  GLUCAP 107* 121* 122* 127* 114*   Lipid Profile: No results  for input(s): CHOL, HDL, LDLCALC, TRIG, CHOLHDL, LDLDIRECT in the last 72 hours. Thyroid Function Tests: No results for input(s): TSH, T4TOTAL, FREET4, T3FREE, THYROIDAB in the last 72 hours. Anemia Panel: Recent Labs    08/31/19 0539  VITAMINB12 1,478*  FOLATE 5.9*  FERRITIN 225  TIBC 265  IRON 29*  RETICCTPCT 2.9   Sepsis Labs: Recent Labs  Lab 08/25/19 2355  LATICACIDVEN 0.8    Recent Results (from the past 240 hour(s))  Blood culture (routine x 2)     Status: None   Collection Time: 08/25/19 11:00 PM   Specimen: BLOOD  Result Value Ref Range Status   Specimen Description   Final    BLOOD LEFT ANTECUBITAL Performed at [facility] [address] [place], [place] [address]    Special Requests   Final    BOTTLES DRAWN AEROBIC AND ANAEROBIC Blood Culture adequate volume Performed at [facility] [address], [place], [place] [address]    Culture   Final    NO GROWTH 5 DAYS Performed at [facility], [place] [address], [place], [place] [address]    Report Status 08/31/2019 FINAL  Final  Blood culture (routine x 2)     Status: None   Collection Time: 08/25/19 11:04 PM   Specimen: BLOOD  Result Value Ref Range Status   Specimen Description   Final    BLOOD LEFT HAND Performed at [facility] [address], [place], [place] [address]    Special Requests   Final    BOTTLES DRAWN AEROBIC AND ANAEROBIC Blood Culture adequate volume Performed at [facility] [address] [place], [place] [address]    Culture   Final    NO GROWTH 5 DAYS Performed at [facility], [place] [address], [place], [place] [address]    Report Status 08/31/2019 FINAL  Final  Urine Culture     Status: Abnormal   Collection Time: 08/26/19 12:58 AM   Specimen: Urine, Random  Result Value Ref Range Status   Specimen Description   Final    URINE, RANDOM Performed at [facility] [address], [place], [place] [address]    Special Requests   Final    NONE Performed at [facility], [place] [address] [place], [place] [address]    Culture (A)  Final    <10,000 COLONIES/mL INSIGNIFICANT GROWTH Performed at [facility] [address] [place], [place] [address]    Report Status 08/27/2019 FINAL  Final  SARS Coronavirus 2 by RT PCR (hospital order, performed in [facility] hospital lab) Nasopharyngeal Nasopharyngeal Swab     Status: None   Collection Time: 08/26/19  1:06 AM   Specimen: Nasopharyngeal Swab  Result Value Ref Range Status   SARS Coronavirus 2 NEGATIVE NEGATIVE Final    Comment: (NOTE)  SARS-CoV-2 target nucleic acids are NOT DETECTED. The SARS-CoV-2 RNA is generally detectable in upper and lower respiratory specimens during the acute phase of infection. The lowest concentration of SARS-CoV-2 viral copies this assay can detect is 250 copies / mL. A negative result does not preclude SARS-CoV-2 infection and should not be used as the sole basis for treatment or other patient management decisions.  A negative result may occur with improper specimen collection / handling, submission of specimen other than nasopharyngeal swab, presence of viral mutation(s) within the areas targeted by this assay, and inadequate number of viral copies (<250 copies / mL). A negative result must be combined with clinical observations, patient history, and epidemiological information. Fact Sheet for Patients:   StrictlyIdeas.no Fact Sheet for Healthcare Providers: BankingDealers.co.za This test is not yet approved or cleared  by the [place] FDA and has been authorized for detection and/or diagnosis of SARS-CoV-2 by FDA under an Emergency Use Authorization (EUA).  This EUA will remain in effect (meaning this test can be used) for the duration of the COVID-19 declaration under Section 564(b)(1) of the  Act, 21 U.S.C. section 360bbb-3(b)(1), unless the authorization is terminated or revoked sooner. Performed at [facility], [place] [address], [place], [place] [address]     RN Pressure Injury Documentation:     Estimated body mass index is 29.27 kg/m as calculated from the following:   Height as of this encounter: 6\' 2"  (1.88 m).   Weight as of this encounter: 103.4 kg.  Malnutrition Type:  Nutrition Problem: Increased nutrient needs Etiology: cancer and cancer related treatments   Malnutrition Characteristics:  Signs/Symptoms: estimated needs   Nutrition Interventions:  Interventions: [place])   Radiology Studies: DG CHEST PORT 1 VIEW  Result Date: 08/31/2019 CLINICAL DATA:  Shortness of breath EXAM: PORTABLE CHEST 1 VIEW COMPARISON:  08/25/2019 FINDINGS: Normal heart size. Convexity of the upper right mediastinum from adenopathy by PET CT. There is no edema, consolidation, effusion, or pneumothorax. IMPRESSION: 1. No acute finding. 2. Known thoracic adenopathy. Electronically Signed   By: Monte Fantasia M.D.   On: 08/31/2019 07:34   Scheduled Meds: . buPROPion  150 mg Oral Daily  . enoxaparin (LOVENOX) injection  40 mg Subcutaneous Q24H  . escitalopram  20 mg Oral Daily  . feeding supplement (KATE FARMS STANDARD 1.4)  325 mL Oral BID BM  . fludrocortisone  0.1 mg Oral Daily  . midodrine  10 mg Oral TID WC  . pantoprazole sodium  40 mg Oral BID  . saccharomyces boulardii  250 mg Oral BID  . sucralfate  1 g Oral TID WC & HS  . tamsulosin  0.4 mg Oral QHS  . vitamin B-12  1,000 mcg Oral Daily   Continuous Infusions: . sodium chloride 75 mL/hr at 08/30/19 2144  . sodium phosphate  Dextrose 5% IVPB 30 mmol (08/31/19 1550)    LOS: 5 days   Kerney Elbe, DO [facility] PAGER is on [place]  If 7PM-7AM, please contact night-coverage [email or web address]

## 2019-08-31 NOTE — Progress Notes (Addendum)
Patient calm during the night and able to sleep. Medicated for nausea/vomitting x 2. Nothing by mouth since midnight. No other complaints or distress noted.

## 2019-08-31 NOTE — Transfer of Care (Signed)
Immediate Anesthesia Transfer of Care Note  Patient: David [name]  Procedure(s) Performed: ESOPHAGOGASTRODUODENOSCOPY (EGD) (N/A ) BIOPSY  Patient Location: PACU  Anesthesia Type:MAC  Level of Consciousness: sedated  Airway & Oxygen Therapy: Patient Spontanous Breathing and Patient connected to face mask oxygen  Post-op Assessment: Report given to RN and Post -op Vital signs reviewed and stable  Post vital signs: Reviewed and stable  Last Vitals:  Vitals Value Taken Time  BP    Temp    Pulse 82 08/31/19 1028  Resp 18 08/31/19 1028  SpO2 100 % 08/31/19 1028  Vitals shown include unvalidated device data.  Last Pain:  Vitals:   08/31/19 0919  TempSrc: Axillary  PainSc: 3          Complications: No apparent anesthesia complications

## 2019-08-31 NOTE — Anesthesia Postprocedure Evaluation (Signed)
Anesthesia Post Note  Patient: Nehemiah Massed Dobler  Procedure(s) Performed: ESOPHAGOGASTRODUODENOSCOPY (EGD) (N/A ) BIOPSY     Patient location during evaluation: Endoscopy Anesthesia Type: MAC Level of consciousness: awake and alert Pain management: pain level controlled Vital Signs Assessment: post-procedure vital signs reviewed and stable Respiratory status: spontaneous breathing, nonlabored ventilation, respiratory function stable and patient connected to nasal cannula oxygen Cardiovascular status: stable and blood pressure returned to baseline Postop Assessment: no apparent nausea or vomiting Anesthetic complications: no    Last Vitals:  Vitals:   08/31/19 1028 08/31/19 1040  BP: (!) 156/80 (!) 160/82  Pulse: 82 81  Resp: 18 16  Temp: 37.1 C   SpO2: 100% 100%    Last Pain:  Vitals:   08/31/19 1130  TempSrc:   PainSc: 0-No pain                 Ayush Boulet,W. EDMOND

## 2019-08-31 NOTE — Anesthesia Preprocedure Evaluation (Addendum)
Anesthesia Evaluation  Patient identified by MRN, date of birth, ID band Patient awake    Reviewed: Allergy & Precautions, H&P , NPO status , Patient's Chart, lab work & pertinent test results  Airway Mallampati: III  TM Distance: >3 FB Neck ROM: Full    Dental no notable dental hx. (+) Teeth Intact, Dental Advisory Given   Pulmonary sleep apnea ,    Pulmonary exam normal breath sounds clear to auscultation       Cardiovascular hypertension,  Rhythm:Regular Rate:Normal     Neuro/Psych Anxiety Depression Dementia negative neurological ROS     GI/Hepatic negative GI ROS, Neg liver ROS, No N/V today   Endo/Other  negative endocrine ROS  Renal/GU negative Renal ROS  negative genitourinary   Musculoskeletal   Abdominal   Peds  Hematology negative hematology ROS (+)   Anesthesia Other Findings   Reproductive/Obstetrics negative OB ROS                           Anesthesia Physical Anesthesia Plan  ASA: III  Anesthesia Plan: MAC   Post-op Pain Management:    Induction: Intravenous  PONV Risk Score and Plan: 1 and Propofol infusion and Treatment may vary due to age or medical condition  Airway Management Planned: Nasal Cannula  Additional Equipment:   Intra-op Plan:   Post-operative Plan:   Informed Consent: I have reviewed the patients History and Physical, chart, labs and discussed the procedure including the risks, benefits and alternatives for the proposed anesthesia with the patient or authorized representative who has indicated his/her understanding and acceptance.   Patient has DNR.  Discussed DNR with patient and Suspend DNR.   Dental advisory given  Plan Discussed with: CRNA  Anesthesia Plan Comments:        Anesthesia Quick Evaluation

## 2019-08-31 NOTE — H&P (Signed)
Patient seen in the endoscopy department this morning.  He is here to have an EGD.  He has a history of esophageal cancer.  We are updating his EGD.  Physical  No distress  Heart regular  Abdomen soft  Lungs clear  Impression esophageal cancer  Plan EGD

## 2019-08-31 NOTE — Op Note (Signed)
[facility] Patient Name: David [name] Procedure Date: 08/31/2019 MRN: [id number] Attending MD: Wonda Horner , MD Date of Birth: 11/16/1945 CSN: [id number] Age: 74 Admit Type: Inpatient Procedure:                Upper GI endoscopy Indications:              Vomiting. History of esophageal cancer post                            radiation. Providers:                Wonda Horner, MD, Josie Dixon, RN, Janeece Agee, Technician Referring MD:              Medicines:                Propofol per Anesthesia Complications:            No immediate complications. Estimated Blood Loss:     Estimated blood loss was minimal. Procedure:                Pre-Anesthesia Assessment:                           - Prior to the procedure, a History and Physical                            was performed, and patient medications and                            allergies were reviewed. The patient's tolerance of                            previous anesthesia was also reviewed. The risks                            and benefits of the procedure and the sedation                            options and risks were discussed with the patient.                            All questions were answered, and informed consent                            was obtained. Prior Anticoagulants: The patient has                            taken no previous anticoagulant or antiplatelet                            agents. ASA Grade Assessment: III - A patient with  severe systemic disease. After reviewing the risks                            and benefits, the patient was deemed in                            satisfactory condition to undergo the procedure.                           After obtaining informed consent, the endoscope was                            passed under direct vision. Throughout the                            procedure, the patient's blood pressure,  pulse, and                            oxygen saturations were monitored continuously. The                            [id number]-[id number] ([id number]) Olympus gastroscope was                            introduced through the mouth, and advanced to the                            second part of duodenum. The upper GI endoscopy was                            accomplished without difficulty. The patient                            tolerated the procedure well. Scope In: Scope Out: Findings:      Esophagitis was found 35 to 45 cm from the incisors. This was ulcerative       and circumferential. Biopsies were taken with a cold forceps for       histology. There was no obvious mass seen. No stricture.      No gross lesions were noted in the entire examined stomach. There is a       PEG TUBE which looks good.      The examined duodenum was normal. Impression:               - Esophagitis. Biopsied. NO OBVIOUS ESOPHAGEAL MASS                           - No gross lesions in the stomach.                           - Normal examined duodenum. Moderate Sedation:      . Recommendation:           - Advance diet as tolerated.                           -  Continue present medications. Procedure Code(s):        --- Professional ---                           [phone], Esophagogastroduodenoscopy, flexible,                            transoral; with biopsy, single or multiple Diagnosis Code(s):        --- Professional ---                           K20.90, Esophagitis, unspecified without bleeding                           R11.10, Vomiting, unspecified CPT copyright 2019 American Medical Association. All rights reserved. The codes documented in this report are preliminary and upon coder review may  be revised to meet current compliance requirements. Wonda Horner, MD 08/31/2019 10:40:01 AM This report has been signed electronically. Number of Addenda: 0

## 2019-08-31 NOTE — Progress Notes (Signed)
See EGD report.  I will try Carafate slurry on him.  Try to feed via PEG tube for now.  If vomiting persists consider short-term TPN.

## 2019-09-01 ENCOUNTER — Other Ambulatory Visit: Payer: Self-pay | Admitting: Oncology

## 2019-09-01 ENCOUNTER — Other Ambulatory Visit: Payer: Self-pay

## 2019-09-01 LAB — COMPREHENSIVE METABOLIC PANEL
ALT: 49 U/L — ABNORMAL HIGH (ref 0–44)
AST: 38 U/L (ref 15–41)
Albumin: 3 g/dL — ABNORMAL LOW (ref 3.5–5.0)
Alkaline Phosphatase: 54 U/L (ref 38–126)
Anion gap: 9 (ref 5–15)
BUN: 6 mg/dL — ABNORMAL LOW (ref 8–23)
CO2: 25 mmol/L (ref 22–32)
Calcium: 10.7 mg/dL — ABNORMAL HIGH (ref 8.9–10.3)
Chloride: 102 mmol/L (ref 98–111)
Creatinine, Ser: 0.81 mg/dL (ref 0.61–1.24)
GFR calc Af Amer: >60 mL/min (ref >60)
GFR calc non Af Amer: >60 mL/min (ref >60)
Glucose, Bld: 104 mg/dL — ABNORMAL HIGH (ref 70–99)
Potassium: 3.2 mmol/L — ABNORMAL LOW (ref 3.5–5.1)
Sodium: 136 mmol/L (ref 135–145)
Total Bilirubin: 0.8 mg/dL (ref 0.3–1.2)
Total Protein: 5.5 g/dL — ABNORMAL LOW (ref 6.5–8.1)

## 2019-09-01 LAB — GLUCOSE, CAPILLARY
Glucose-Capillary: 102 mg/dL — ABNORMAL HIGH (ref 70–99)
Glucose-Capillary: 103 mg/dL — ABNORMAL HIGH (ref 70–99)
Glucose-Capillary: 106 mg/dL — ABNORMAL HIGH (ref 70–99)
Glucose-Capillary: 87 mg/dL (ref 70–99)
Glucose-Capillary: 90 mg/dL (ref 70–99)
Glucose-Capillary: 93 mg/dL (ref 70–99)
Glucose-Capillary: 98 mg/dL (ref 70–99)

## 2019-09-01 LAB — PHOSPHORUS: Phosphorus: 3.2 mg/dL (ref 2.5–4.6)

## 2019-09-01 LAB — CBC WITH DIFFERENTIAL/PLATELET
Abs Immature Granulocytes: 0.07 10*3/uL (ref 0.00–0.07)
Basophils Absolute: 0 10*3/uL (ref 0.0–0.1)
Basophils Relative: 0 %
Eosinophils Absolute: 0.1 10*3/uL (ref 0.0–0.5)
Eosinophils Relative: 1 %
HCT: 35.3 % — ABNORMAL LOW (ref 39.0–52.0)
Hemoglobin: 11.7 g/dL — ABNORMAL LOW (ref 13.0–17.0)
Immature Granulocytes: 1 %
Lymphocytes Relative: 11 %
Lymphs Abs: 0.8 10*3/uL (ref 0.7–4.0)
MCH: 29.4 pg (ref 26.0–34.0)
MCHC: 33.1 g/dL (ref 30.0–36.0)
MCV: 88.7 fL (ref 80.0–100.0)
Monocytes Absolute: 0.5 10*3/uL (ref 0.1–1.0)
Monocytes Relative: 8 %
Neutro Abs: 5.3 10*3/uL (ref 1.7–7.7)
Neutrophils Relative %: 79 %
Platelets: 141 10*3/uL — ABNORMAL LOW (ref 150–400)
RBC: 3.98 MIL/uL — ABNORMAL LOW (ref 4.22–5.81)
RDW: 13.7 % (ref 11.5–15.5)
WBC: 6.8 10*3/uL (ref 4.0–10.5)
nRBC: 0 % (ref 0.0–0.2)

## 2019-09-01 LAB — MAGNESIUM: Magnesium: 2 mg/dL (ref 1.7–2.4)

## 2019-09-01 MED ORDER — POTASSIUM CHLORIDE 10 MEQ/100ML IV SOLN
10.0000 meq | INTRAVENOUS | Status: AC
  Administered 2019-09-01 (×5): 10 meq via INTRAVENOUS
  Filled 2019-09-01 (×5): qty 100

## 2019-09-01 MED ORDER — SODIUM CHLORIDE 0.9 % IV BOLUS
1000.0000 mL | Freq: Once | INTRAVENOUS | Status: AC
  Administered 2019-09-01: 1000 mL via INTRAVENOUS

## 2019-09-01 MED ORDER — POTASSIUM CHLORIDE IN NACL 40-0.9 MEQ/L-% IV SOLN
INTRAVENOUS | Status: DC
  Administered 2019-09-01 – 2019-09-06 (×6): 75 mL/h via INTRAVENOUS
  Filled 2019-09-01 (×8): qty 1000

## 2019-09-01 MED ORDER — ACETAMINOPHEN 325 MG PO TABS
650.0000 mg | ORAL_TABLET | Freq: Four times a day (QID) | ORAL | Status: DC | PRN
  Administered 2019-09-06 – 2019-09-10 (×4): 650 mg via ORAL
  Filled 2019-09-01 (×5): qty 2

## 2019-09-01 NOTE — Progress Notes (Signed)
PROGRESS NOTE    Gaspard Isbell Pryor  HEN:[id number] DOB: [birth year] DOA: 08/25/2019 PCP: Leighton Ruff, MD   Brief Narrative:  HPI per Dr. Gean Birchwood on 08/25/19 Wylee Dorantes Schaffert is a 74 y.o. male with history of esophageal cancer PEG tube placement BPH dementia depression who was recently admitted for syncope on August 06, 2019 discharged on Aug 13, 2019 was observed to be increasingly weak over the last 4 to 5 days and getting more confused.  Patient also had some vomiting and patient's oncologist Dr. Learta Codding advised to stop the enteral feeds for 2 days.  And it was restarted again 2 days ago following which later in the evening patient had some vomiting patient wife also noticed some fevers around around 99 degrees.  Patient also has very frequent urination.  Did not complain of any pain or any shortness of breath.  Patient also has been having dizziness when he stands up.  Has some cough nonproductive.  Given these changes patient was brought to the ER.  ED Course: In the ER patient was alert awake oriented to his name and place moving all extremities.  CT head was unremarkable.  Labs show WBC of 10.6 hemoglobin 12.9 calcium was 10.8 sodium 131 lactic acid 0.8 EKG normal sinus rhythm.  Covid test was negative.  UA was concerning for UTI and chest x-ray shows possible infiltrates.  Cultures were obtained and patient started on antibiotics and admitted for further observation.  **Interim History  Continued to have some nausea and vomiting. CT abdomen pelvis was unremarkable and KUB was unremarkable as well. Nausea and vomiting had improved but worsened again so GI was consulted for further evaluation In the interim we will obtain a calorie count for 48hours to see how the patient adequately takes his nutrition.  Medical oncology is consulted, and continue to hold tube feedings and diet as tolerated.  We will continue IV fluids for now.  Patient still continues to be orthostatic and repeat  orthostatic vital signs show that he is still orthostatic so will try an abdominal binder and also give the patient a 1 L fluid bolus in addition to continuing IV fluid hydration.  EGD done showed Esophagitis was ulcerative and circumferential and biopsies were taken with a cold forceps.  PEG tube looks good per GI and there is no obvious esophageal mass noted on the EGD this time.  GI recommended starting the patient on clear liquid diet and resuming tube feedings and starting PPI and Carafate slurry.  We will continue fluid hydration and will continue to monitor his orthostatics carefully.  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  Patient admitted with cough and confusion.   -Urine culture shows no growth.  Patient still confused though better than yesterday per staff however oncology feels that he is at his baseline given that he does have dementia.   -On Rocephin and azithromycin.  Completed Abx now -Repeat CXR on 5/22 AM showed "Normal heart size. Convexity of the upper right mediastinum from adenopathy by PET CT. There is no edema, consolidation, effusion, or pneumothorax."  Nausea and Vomiting in the setting of Esophagitis  -Persistent but has now improved yesterday but worsened again so GI was consulted as below -  C/w Antiemetics -Check CT Abd/Pelvis w/ Contrast and showed persistent distal esophageal mass consistent with known esophageal cancer and also did show progressive retroperitoneal lymphadenopathy with new enlarging lymph nodes; the previous celiac and gastrohepatic ligament adenopathy had now resolved and he does have some stable splenomegaly as well as a trace right  pleural effusion.  Patient also did have some stable bilateral urinary tract calculi without obstruction -KUB was unremarkable -We will have GI formally consult since he continued to vomit today and yesterday and they are going to pursue endoscopy tomorrow to evaluate his esophagus and help with diet recommendations based on the radiation damage to his residual cancer -He underwent an EGD which showed esophagitis to 35 to 45 cm from the incisors and was ulcerative and circumferential -GI recommending clear liquid diet and advancing as tolerated as well as Carafate slurry and continue PPI.  Dr. Penelope Coop believes it is from the radiation and thinks that it will take some time to heal.  He recommends resuming tube feedings and will do so slowly but will have dietary adjust  -Currently getting Dillard [place] BID   Orthostatic Hypotension -Patient is profoundly orthostatic still; BP went from 164/92 lying -> 131/79 sitting -> 88/57 Standing -Started normal saline + 40 mEQ of KCl and will give a bolus of normal saline 1 Liter again; we will continue IV fluid hydration -Repeat orthostatic vital signs showed he was extremely orthostatic -May need a Neurology or Cardiology Consult to see why he remains this Orthostatic  -Continue with midodrine, Florinef, TED hose, and will order an abdominal binder.  Unfortunately we do not carry droxidopa however we would likely try that in the outpatient setting -We will need to monitor carefully and instead of Knee High TEDs we are ordering Thigh High  Status post PEG tube -Hold feeds now that he continues to Vomit but the vomiting is likely from his esophagitis. -We will obtain a calorie count and have consulted nutrition for further evaluation; calorie count has been initiated yesterday and will be continued through today -Resume PEG feedings slowly and will consult Nutrition for re-evaluation and resumption   Depression  -On Wellbutrin and Lexapro  Normocytic  Anemia -Hgb/Hct now is 11.7/35.3 -Check Anemia Panel and showed an iron level of 29, U IBC of 236, TIBC 265, saturation ratios of 11%, ferritin of 225, folate level 5.9, vitamin B12 1478 -Start Iron supplementation with Niferex 150 mg po Daily  -Continue to Monitor for S/Sx of Bleeding; No Overt Bleeding noted -Repeat CBC in AM   Thrombocytopenia -Patient's Platelet Count had improved to 160,000 and is now 141,000 -Continue to Monitor and Trend; Currently no overt bleeding noted -Repeat CBC in AM   Abnormal LFTs -Slightly worsening as AST went from 33 -> 42 -> 45 -> 34 -> 38 -ALT went from 29 -> 42 -> 52 -> 47 -> 49 -Continue to Monitor Carefully and if Necessary will obtain RUQ U/S and Acute Heptatitis Panel -Continue to Monitor and Trend Hepatic Fxn Panel -Repeat CMP in the AM   Hypokalemia -Mild at 3.2 -Replete with IV potassium chloride 40 mEq x 1 again; Started Fluid with NS + 40 mEQ KCl at 75 mL/hr -Continue to Monitor and Replete as Necessary -Repeat CMP in QAM  Hypophosphatemia -Patient's Phos Level is now 3.2 -Continue to Monitor and Repelte as Necessary -Repeat Phos Level in the AM   Hyponatremia -Continue with IV fluid hydration with normal saline and added KCl; Na+ is 136 -Continue to monitor and  replete as necessary -Repeat CMP in the a.m.  History of Dementia  -Worsening; Oncology feels he is at his baseline -He continues to complain of being "confused and groggy" -Place on Delirium Precautions -Has a 1:1 Safety Sitter   History of Esophageal Cancer  -followed by Dr. Benay Spice and is being seen inpatient -Appreciate Dr. Gearldine Shown evaluation -EGD yesterday a.m. showed no esophageal mass noted  Goals of Care -Dr. Rodena Piety discussed with patient's wife, has living will at home.   -CODE STATUS changed to DNR which is appropriate for the patient.   -She also expressed a desire to speak with hospice.  Dr. Zigmund Daniel discussed with [facility] hosp[facility] who will  contact her.  DVT prophylaxis: Apparent 40 mg subcu every 24 Code Status: DO NOT RESUSCITATE  Family Communication: Discussed with Wife over the Telephone Disposition Plan: Pending further improvement of his orthostatic hypotension and evaulation and management of his Nausea; Still profoundly orthostatic   Status is: Inpatient  Remains inpatient appropriate because:Ongoing diagnostic testing needed not appropriate for outpatient work up and IV treatments appropriate due to intensity of illness or inability to take PO; Will get a Calorie Count  Dispo: The patient is from: Home              Anticipated d/c is to: SNF              Anticipated d/c date is: 2 days              Patient currently is not medically stable to d/c. given continued Orthostasis    Consultants:   Medical Oncology  Gastroenterology    Procedures:  EGD     Esophagitis was found 35 to 45 cm from the incisors. This was ulcerative       and circumferential. Biopsies were taken with a cold forceps for       histology. There was no obvious mass seen. No stricture.      No gross lesions were noted in the entire examined stomach. There is a       PEG TUBE which looks good.      The examined duodenum was normal. Impression:               - Esophagitis. Biopsied. NO OBVIOUS ESOPHAGEAL MASS                           - No gross lesions in the stomach.                           - Normal examined duodenum.   Antimicrobials:  Anti-infectives (From admission, onward)   Start     Dose/Rate Route Frequency Ordered Stop   08/27/19 0200  cefTRIAXone (ROCEPHIN) 2 g in sodium chloride 0.9 % 100 mL IVPB     2 g 200 mL/hr over 30 Minutes Intravenous Every 24 hours 08/26/19 0411 08/31/19 0233   08/26/19 0500  azithromycin (ZITHROMAX) 500 mg in sodium chloride 0.9 % 250 mL IVPB     500 mg 250 mL/hr over 60 Minutes Intravenous Every 24 hours 08/26/19 0411 08/30/19 0520   08/26/19 0115  cefTRIAXone (ROCEPHIN) 2 g in sodium  chloride 0.9 % 100 mL IVPB     2 g 200 mL/hr over 30 Minutes Intravenous  Once 08/26/19 0106 08/26/19 0244     Subjective: Seen and examined at bedside and he was resting with no issues and  then complained of a headache later on. No CP or SOB. Was fatigued. Nursing states that his BP Dropped significantly again. No vomiting today. No other concerns or complaints at this time.   Objective: Vitals:   09/01/19 0928 09/01/19 0931 09/01/19 1400 09/01/19 1817  BP: 131/79  (!) 150/94 140/81  Pulse: 88  73 83  Resp: 19  17 20   Temp:   97.8 F (36.6 C) (!) 97.4 F (36.3 C)  TempSrc:   Oral Oral  SpO2: 100% 100% 99% 100%  Weight:      Height:        Intake/Output Summary (Last 24 hours) at 09/01/2019 1825 Last data filed at 09/01/2019 1725 Gross per 24 hour  Intake 120 ml  Output 1425 ml  Net -1305 ml   Filed Weights   08/30/19 0454 08/31/19 0919 09/01/19 0428  Weight: 103.4 kg 103.4 kg 101.2 kg   Examination: Physical Exam:  Constitutional: WN/WD Caucasian male in NAD and appears calm  Eyes: Lids and conjunctivae normal, sclerae anicteric; Has a cut above Left eye from fall  ENMT: External Ears, Nose appear normal. Grossly normal hearing.  Neck: Appears normal, supple, no cervical masses, normal ROM, no appreciable thyromegaly; no JVD Respiratory: Diminished to auscultation bilaterally, no wheezing, rales, rhonchi or crackles. Normal respiratory effort and patient is not tachypenic. No accessory muscle use. Unlabored breathing  Cardiovascular: RRR, no murmurs / rubs / gallops. S1 and S2 auscultated.  Abdomen: Soft, non-tender, Distended 2/2 body habitus. Bowel sounds positive x4. PEG in place GU: Deferred. Musculoskeletal: No clubbing / cyanosis of digits/nails. No joint deformity upper and lower extremities.  Skin: No rashes, lesions, ulcers on a limited skin evalution. No induration; Warm and dry.  Neurologic: CN 2-12 grossly intact with no focal deficits. Romberg sign and  cerebellar reflexes not assessed.  Psychiatric: Normal judgment and insight. Alert and oriented x 3. Normal mood and appropriate affect.   Data Reviewed: I have personally reviewed following labs and imaging studies  CBC: Recent Labs  Lab 08/28/19 0924 08/29/19 0549 08/30/19 0540 08/31/19 0539 09/01/19 0543  WBC 7.4 6.6 6.8 9.3 6.8  NEUTROABS 5.8 5.1 5.4 7.7 5.3  HGB 12.1* 11.1* 11.8* 12.1* 11.7*  HCT 35.5* 33.6* 34.6* 36.6* 35.3*  MCV 87.7 88.0 85.6 87.1 88.7  PLT 139* 140* 159 160 967*   Basic Metabolic Panel: Recent Labs  Lab 08/28/19 0924 08/29/19 0549 08/30/19 0540 08/31/19 0539 09/01/19 0543  NA 133* 131* 131* 133* 136  K 3.1* 3.1* 3.3* 3.3* 3.2*  CL 101 98 103 104 102  CO2 22 25 22 22 25   GLUCOSE 123* 113* 109* 128* 104*  BUN 9 6* 5* 6* 6*  CREATININE 0.98 0.85 0.84 0.96 0.81  CALCIUM 9.9 9.9 10.0 10.2 10.7*  MG 1.9 1.8 2.2 2.0 2.0  PHOS 2.1* 3.1 2.1* 3.3 3.2   GFR: Estimated Creatinine Clearance: 104.7 mL/min (by C-G formula based on SCr of 0.81 mg/dL). Liver Function Tests: Recent Labs  Lab 08/28/19 0924 08/29/19 0549 08/30/19 0540 08/31/19 0539 09/01/19 0543  AST 33 42* 45* 34 38  ALT 29 42 52* 47* 49*  ALKPHOS 51 52 51 51 54  BILITOT 0.9 0.6 1.0 0.8 0.8  PROT 5.5* 5.3* 5.6* 5.5* 5.5*  ALBUMIN 2.9* 2.8* 2.9* 2.9* 3.0*   No results for input(s): LIPASE, AMYLASE in the last 168 hours. No results for input(s): AMMONIA in the last 168 hours. Coagulation Profile: No results for input(s): INR, PROTIME in the last  168 hours. Cardiac Enzymes: No results for input(s): CKTOTAL, CKMB, CKMBINDEX, TROPONINI in the last 168 hours. BNP (last 3 results) No results for input(s): PROBNP in the last 8760 hours. HbA1C: No results for input(s): HGBA1C in the last 72 hours. CBG: Recent Labs  Lab 09/01/19 0020 09/01/19 0410 09/01/19 0755 09/01/19 1144 09/01/19 1612  GLUCAP 103* 98 106* 102* 87   Lipid Profile: No results for input(s): CHOL, HDL,  LDLCALC, TRIG, CHOLHDL, LDLDIRECT in the last 72 hours. Thyroid Function Tests: No results for input(s): TSH, T4TOTAL, FREET4, T3FREE, THYROIDAB in the last 72 hours. Anemia Panel: Recent Labs    08/31/19 0539  VITAMINB12 1,478*  FOLATE 5.9*  FERRITIN 225  TIBC 265  IRON 29*  RETICCTPCT 2.9   Sepsis Labs: Recent Labs  Lab 08/25/19 2355  LATICACIDVEN 0.8    Recent Results (from the past 240 hour(s))  Blood culture (routine x 2)     Status: None   Collection Time: 08/25/19 11:00 PM   Specimen: BLOOD  Result Value Ref Range Status   Specimen Description   Final    BLOOD LEFT ANTECUBITAL Performed at [facility] [address], [place], [place] [address]    Special Requests   Final    BOTTLES DRAWN AEROBIC AND ANAEROBIC Blood Culture adequate volume Performed at [facility] [address], [place], [place] [address]    Culture   Final    NO GROWTH 5 DAYS Performed at [facility], [place] [address] [place], [place] [address]    Report Status 08/31/2019 FINAL  Final  Blood culture (routine x 2)     Status: None   Collection Time: 08/25/19 11:04 PM   Specimen: BLOOD  Result Value Ref Range Status   Specimen Description   Final    BLOOD LEFT HAND Performed at [facility] [address], [place], [place] [address]    Special Requests   Final    BOTTLES DRAWN AEROBIC AND ANAEROBIC Blood Culture adequate volume Performed at [facility] [address] [place], [place] [address]    Culture   Final    NO GROWTH 5 DAYS Performed at [facility], [place] [address] [place], [place] [address]    Report Status 08/31/2019 FINAL  Final  Urine Culture     Status: Abnormal   Collection Time: 08/26/19 12:58 AM   Specimen: Urine, Random  Result Value Ref Range Status   Specimen Description   Final    URINE, RANDOM Performed at [facility] [address]  [place], [place] [address]    Special Requests   Final    NONE Performed at [facility], [place] [address] [place], [place] [address]    Culture (A)  Final    <10,000 COLONIES/mL INSIGNIFICANT GROWTH Performed at [place] [place] [facility] [address], [place],  [address]    Report Status 08/27/2019 FINAL  Final  SARS Coronavirus 2 by RT PCR (hospital order, performed in [facility] hospital lab) Nasopharyngeal Nasopharyngeal Swab     Status: None   Collection Time: 08/26/19  1:06 AM   Specimen: Nasopharyngeal Swab  Result Value Ref Range Status   SARS Coronavirus 2 NEGATIVE NEGATIVE Final    Comment: (NOTE) SARS-CoV-2 target nucleic acids are NOT DETECTED. The SARS-CoV-2 RNA is generally detectable in upper and lower respiratory specimens during the acute phase of infection. The lowest concentration of SARS-CoV-2 viral  copies this assay can detect is 250 copies / mL. A negative result does not preclude SARS-CoV-2 infection and should not be used as the sole basis for treatment or other patient management decisions.  A negative result may occur with improper specimen collection / handling, submission of specimen other than nasopharyngeal swab, presence of viral mutation(s) within the areas targeted by this assay, and inadequate number of viral copies (<250 copies / mL). A negative result must be combined with clinical observations, patient history, and epidemiological information. Fact Sheet for Patients:   StrictlyIdeas.no Fact Sheet for Healthcare Providers: BankingDealers.co.za This test is not yet approved or cleared  by the [place] FDA and has been authorized for detection and/or diagnosis of SARS-CoV-2 by FDA under an Emergency Use Authorization (EUA).  This EUA will remain in effect (meaning this test can be used) for the duration of the COVID-19 declaration under Section 564(b)(1) of the Act, 21  U.S.C. section 360bbb-3(b)(1), unless the authorization is terminated or revoked sooner. Performed at [facility], [place] [address], [place], [place] [address]     RN Pressure Injury Documentation:     Estimated body mass index is 28.63 kg/m as calculated from the following:   Height as of this encounter: 6\' 2"  (1.88 m).   Weight as of this encounter: 101.2 kg.  Malnutrition Type:  Nutrition Problem: Increased nutrient needs Etiology: cancer and cancer related treatments   Malnutrition Characteristics:  Signs/Symptoms: estimated needs   Nutrition Interventions:  Interventions: Dillard [place])   Radiology Studies: DG CHEST PORT 1 VIEW  Result Date: 08/31/2019 CLINICAL DATA:  Shortness of breath EXAM: PORTABLE CHEST 1 VIEW COMPARISON:  08/25/2019 FINDINGS: Normal heart size. Convexity of the upper right mediastinum from adenopathy by PET CT. There is no edema, consolidation, effusion, or pneumothorax. IMPRESSION: 1. No acute finding. 2. Known thoracic adenopathy. Electronically Signed   By: Monte Fantasia M.D.   On: 08/31/2019 07:34   Scheduled Meds: . buPROPion  150 mg Oral Daily  . enoxaparin (LOVENOX) injection  40 mg Subcutaneous Q24H  . escitalopram  20 mg Oral Daily  . feeding supplement (KATE FARMS STANDARD 1.4)  325 mL Oral BID BM  . fludrocortisone  0.1 mg Oral Daily  . iron polysaccharides  150 mg Oral Daily  . midodrine  10 mg Oral TID WC  . pantoprazole sodium  40 mg Oral BID  . saccharomyces boulardii  250 mg Oral BID  . sucralfate  1 g Oral TID WC & HS  . tamsulosin  0.4 mg Oral QHS  . vitamin B-12  1,000 mcg Oral Daily   Continuous Infusions: . 0.9 % NaCl with KCl 40 mEq / L 75 mL/hr (09/01/19 1014)    LOS: 6 days   Kerney Elbe, DO [facility] PAGER is on [place]  If 7PM-7AM, please contact night-coverage [email or web address]

## 2019-09-02 ENCOUNTER — Encounter: Payer: Self-pay | Admitting: *Deleted

## 2019-09-02 DIAGNOSIS — I951 Orthostatic hypotension: Secondary | ICD-10-CM

## 2019-09-02 DIAGNOSIS — R0602 Shortness of breath: Secondary | ICD-10-CM

## 2019-09-02 DIAGNOSIS — R112 Nausea with vomiting, unspecified: Secondary | ICD-10-CM

## 2019-09-02 LAB — GLUCOSE, CAPILLARY
Glucose-Capillary: 100 mg/dL — ABNORMAL HIGH (ref 70–99)
Glucose-Capillary: 101 mg/dL — ABNORMAL HIGH (ref 70–99)
Glucose-Capillary: 101 mg/dL — ABNORMAL HIGH (ref 70–99)
Glucose-Capillary: 106 mg/dL — ABNORMAL HIGH (ref 70–99)
Glucose-Capillary: 121 mg/dL — ABNORMAL HIGH (ref 70–99)
Glucose-Capillary: 141 mg/dL — ABNORMAL HIGH (ref 70–99)

## 2019-09-02 LAB — CBC WITH DIFFERENTIAL/PLATELET
Abs Immature Granulocytes: 0.07 10*3/uL (ref 0.00–0.07)
Basophils Absolute: 0 10*3/uL (ref 0.0–0.1)
Basophils Relative: 1 %
Eosinophils Absolute: 0.1 10*3/uL (ref 0.0–0.5)
Eosinophils Relative: 1 %
HCT: 34.7 % — ABNORMAL LOW (ref 39.0–52.0)
Hemoglobin: 11.4 g/dL — ABNORMAL LOW (ref 13.0–17.0)
Immature Granulocytes: 1 %
Lymphocytes Relative: 10 %
Lymphs Abs: 0.7 10*3/uL (ref 0.7–4.0)
MCH: 28.6 pg (ref 26.0–34.0)
MCHC: 32.9 g/dL (ref 30.0–36.0)
MCV: 87.2 fL (ref 80.0–100.0)
Monocytes Absolute: 0.5 10*3/uL (ref 0.1–1.0)
Monocytes Relative: 8 %
Neutro Abs: 5.2 10*3/uL (ref 1.7–7.7)
Neutrophils Relative %: 79 %
Platelets: 153 10*3/uL (ref 150–400)
RBC: 3.98 MIL/uL — ABNORMAL LOW (ref 4.22–5.81)
RDW: 13.7 % (ref 11.5–15.5)
WBC: 6.6 10*3/uL (ref 4.0–10.5)
nRBC: 0 % (ref 0.0–0.2)

## 2019-09-02 LAB — TSH: TSH: 1.948 u[IU]/mL (ref 0.350–4.500)

## 2019-09-02 LAB — COMPREHENSIVE METABOLIC PANEL
ALT: 45 U/L — ABNORMAL HIGH (ref 0–44)
AST: 31 U/L (ref 15–41)
Albumin: 3 g/dL — ABNORMAL LOW (ref 3.5–5.0)
Alkaline Phosphatase: 53 U/L (ref 38–126)
Anion gap: 9 (ref 5–15)
BUN: 7 mg/dL — ABNORMAL LOW (ref 8–23)
CO2: 23 mmol/L (ref 22–32)
Calcium: 10.5 mg/dL — ABNORMAL HIGH (ref 8.9–10.3)
Chloride: 101 mmol/L (ref 98–111)
Creatinine, Ser: 0.99 mg/dL (ref 0.61–1.24)
GFR calc Af Amer: >60 mL/min (ref >60)
GFR calc non Af Amer: >60 mL/min (ref >60)
Glucose, Bld: 103 mg/dL — ABNORMAL HIGH (ref 70–99)
Potassium: 3.5 mmol/L (ref 3.5–5.1)
Sodium: 133 mmol/L — ABNORMAL LOW (ref 135–145)
Total Bilirubin: 1.2 mg/dL (ref 0.3–1.2)
Total Protein: 5.4 g/dL — ABNORMAL LOW (ref 6.5–8.1)

## 2019-09-02 LAB — PHOSPHORUS: Phosphorus: 2.6 mg/dL (ref 2.5–4.6)

## 2019-09-02 LAB — MAGNESIUM: Magnesium: 1.9 mg/dL (ref 1.7–2.4)

## 2019-09-02 LAB — CORTISOL-AM, BLOOD: Cortisol - AM: 19.9 ug/dL (ref 6.7–22.6)

## 2019-09-02 MED ORDER — SODIUM CHLORIDE 0.9 % IV BOLUS
1000.0000 mL | Freq: Once | INTRAVENOUS | Status: AC
  Administered 2019-09-02: 1000 mL via INTRAVENOUS

## 2019-09-02 MED ORDER — FLUDROCORTISONE ACETATE 0.1 MG PO TABS: 0.1000 mg | ORAL_TABLET | Freq: Two times a day (BID) | ORAL | Status: DC

## 2019-09-02 MED ORDER — FLUDROCORTISONE ACETATE 0.1 MG PO TABS
0.2000 mg | ORAL_TABLET | Freq: Every day | ORAL | Status: DC
  Administered 2019-09-03 – 2019-09-10 (×8): 0.2 mg via ORAL
  Filled 2019-09-02 (×8): qty 2

## 2019-09-02 MED ORDER — KATE FARMS STANDARD 1.4 PO LIQD
480.0000 mL | Freq: Two times a day (BID) | ORAL | Status: DC
  Filled 2019-09-02 (×2): qty 650

## 2019-09-02 MED ORDER — DROXIDOPA 100 MG PO CAPS
100.0000 mg | ORAL_CAPSULE | Freq: Three times a day (TID) | ORAL | Status: DC
  Administered 2019-09-03 – 2019-09-10 (×20): 100 mg via ORAL
  Filled 2019-09-02 (×24): qty 1

## 2019-09-02 MED ORDER — KATE FARMS STANDARD 1.4 PO LIQD
480.0000 mL | ORAL | Status: DC
  Administered 2019-09-02: 480 mL
  Filled 2019-09-02 (×2): qty 650

## 2019-09-02 MED ORDER — ALUM & MAG HYDROXIDE-SIMETH 200-200-20 MG/5ML PO SUSP
30.0000 mL | ORAL | Status: DC | PRN
  Administered 2019-09-02: 30 mL via ORAL
  Filled 2019-09-02: qty 30

## 2019-09-02 MED ORDER — POTASSIUM CHLORIDE 10 MEQ/100ML IV SOLN
10.0000 meq | INTRAVENOUS | Status: AC
  Administered 2019-09-02 – 2019-09-03 (×4): 10 meq via INTRAVENOUS
  Filled 2019-09-02 (×4): qty 100

## 2019-09-02 NOTE — Progress Notes (Signed)
PROGRESS NOTE    David [name]  BVQ:[id number] DOB: 27-Dec-1945 DOA: 08/25/2019 PCP: Leighton Ruff, MD   Brief Narrative:  HPI per Dr. Gean Birchwood on 08/25/19 David [name] is a 74 y.o. male with history of esophageal cancer PEG tube placement BPH dementia depression who was recently admitted for syncope on August 06, 2019 discharged on Aug 13, 2019 was observed to be increasingly weak over the last 4 to 5 days and getting more confused.  Patient also had some vomiting and patient's oncologist Dr. Learta Codding advised to stop the enteral feeds for 2 days.  And it was restarted again 2 days ago following which later in the evening patient had some vomiting patient wife also noticed some fevers around around 99 degrees.  Patient also has very frequent urination.  Did not complain of any pain or any shortness of breath.  Patient also has been having dizziness when he stands up.  Has some cough nonproductive.  Given these changes patient was brought to the ER.  ED Course: In the ER patient was alert awake oriented to his name and place moving all extremities.  CT head was unremarkable.  Labs show WBC of 10.6 hemoglobin 12.9 calcium was 10.8 sodium 131 lactic acid 0.8 EKG normal sinus rhythm.  Covid test was negative.  UA was concerning for UTI and chest x-ray shows possible infiltrates.  Cultures were obtained and patient started on antibiotics and admitted for further observation.  **Interim History  Continued to have some nausea and vomiting. CT abdomen pelvis was unremarkable and KUB was unremarkable as well. Nausea and vomiting had improved but worsened again so GI was consulted for further evaluation In the interim we will obtain a calorie count for 48hours to see how the patient adequately takes his nutrition.  Medical oncology is consulted, and continue to hold tube feedings and diet as tolerated.  We will continue IV fluids for now.  Patient still continues to be orthostatic and repeat  orthostatic vital signs show that he is still orthostatic so will try an abdominal binder and also give the patient a 1 L fluid bolus in addition to continuing IV fluid hydration. [place] of his Significant Orthostatic Hypotension Cardiology was consulted. Cardiology witnessed the patient having orthostatic vital signs and they feel that he has a mixed picture of neurocardiogenic syncope as well as orthostatic hypotension they are recommending starting the patient on Northera 100 mg p.o. 3 times daily given his refractory orthostatic hypotension.  After much discussion with the clinical pharmacist cardiology was able to obtain Northera and will be available for [place] to give to the patient tomorrow  EGD done showed Esophagitis was ulcerative and circumferential and biopsies were taken with a cold forceps.  PEG tube looks good per GI and there is no obvious esophageal mass noted on the EGD this time.  GI recommended starting the patient on clear liquid diet and resuming tube feedings and starting PPI and Carafate slurry.  We will continue fluid hydration and will continue to monitor his orthostatics carefully.  GI recommending advancing diet as tolerated and is now on a good diet and will continue to advance to a soft diet.  We will follow up on his biopsy results.  Nausea vomiting is improving.  We will reinitiate tube feedings at a low rate and slowly advance.  They are recommending addition back and neck edema recommend if able to tolerate 24-hour feeds.  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  Patient admitted with cough and confusion.   -Urine  culture shows no growth.  Patient still confused though better than yesterday per staff however oncology feels that he is at his baseline given that he does have dementia.   -On Rocephin and azithromycin.  Completed Abx now -Repeat CXR on 5/22 AM showed "Normal heart size. Convexity of the upper right mediastinum from adenopathy by PET CT. There is no edema, consolidation, effusion, or pneumothorax." -His donepezil and his memantine are being held  Nausea and Vomiting in the setting of Esophagitis, improving -Persistent but has now improved yesterday but worsened again so GI was consulted as below -C/w Antiemetics -Check CT Abd/Pelvis w/ Contrast and showed persistent distal esophageal mass consistent with known esophageal cancer and also did show progressive retroperitoneal lymphadenopathy with new enlarging lymph nodes; the previous celiac and gastrohepatic ligament adenopathy had now resolved and he does have some stable splenomegaly as well as a trace right pleural effusion.  Patient also did have some stable bilateral urinary tract calculi without obstruction -KUB was unremarkable -We will have GI formally consult since he continued to vomit today and yesterday and they are going to pursue endoscopy tomorrow to evaluate his esophagus and help with diet recommendations based on the radiation damage to his residual cancer -He underwent an EGD which showed esophagitis to 35 to 45 cm from the incisors and was ulcerative and circumferential -GI recommending clear liquid diet and advancing as tolerated as well as Carafate slurry and continue PPI.  Dr. Penelope Coop believes it is from the radiation and thinks that it will take some time to heal.  He recommends resuming tube feedings and will do so slowly but will have dietary adjust; dietitian has reinitiated tube feedings and his diet is being advanced to a full liquid diet today and likely to a soft diet tomorrow -Currently getting [employer]  BID;  Orthostatic Hypotension, persistent -Patient is profoundly orthostatic still; BP went from 164/92 lying -> 131/79 sitting -> 88/57 Standing yesterday -Started normal saline + 40 mEQ of KCl and will give a bolus of normal saline 1 Liter again (Got 1 yesterday); we will continue IV fluid hydration -Repeat orthostatic vital signs showed he was extremely orthostatic -Cardiology was consulted for further evaluation and they are recommending Northera given that he has a mixed neurocardiogenic picture -Continue with midodrine, Florinef, TED hose, and will order an abdominal binder.  Unfortunately we do not carry droxidopa and it was an issue to get inpatient however after much discussion with the cardiology PA and between the clinical pharmacist we will obtain it for tomorrow, outside pharmacy and it will cost the patient $86 at discharge -We will need to monitor carefully and instead of Knee High TEDs we are ordering Thigh High -Appreciate cardiology evaluation recommendations  Status post PEG tube -Hold feeds now that he continues to Vomit but the vomiting is likely from his esophagitis. -We will obtain a calorie count and have consulted nutrition for further evaluation; calorie count has been initiated yesterday and will be continued through today -Resume PEG feedings slowly and will consult Nutrition for re-evaluation and resumption   Depression  -On Wellbutrin and Lexapro  Normocytic Anemia -  Hgb/Hct now is 11.4/34.7 -Check Anemia Panel and showed an iron level of 29, U IBC of 236, TIBC 265, saturation ratios of 11%, ferritin of 225, folate level 5.9, vitamin B12 1478 -Start Iron supplementation with Niferex 150 mg po Daily  -Continue to Monitor for S/Sx of Bleeding; No Overt Bleeding noted -Repeat CBC in AM   Thrombocytopenia -Patient's Platelet Count had improved to 160,000 and is now 141,000 -Continue to Monitor and Trend; Currently no overt bleeding noted -Repeat CBC in AM    Abnormal LFTs -AST went from 33 -> 42 -> 45 -> 34 -> 38 -> 31 -ALT went from 29 -> 42 -> 52 -> 47 -> 49 -> 45 -Continue to Monitor Carefully and if Necessary will obtain RUQ U/S and Acute Heptatitis Panel -Continue to Monitor and Trend Hepatic Fxn Panel -Repeat CMP in the AM   Hypokalemia -Mild at 3.5 -Replete with IV potassium chloride 40 mEq x 1 again; Started Fluid with NS + 40 mEQ KCl at 75 mL/hr -Continue to Monitor and Replete as Necessary -Repeat CMP in QAM  Hypophosphatemia -Patient's Phos Level is now 3.2 -Continue to Monitor and Repelte as Necessary -Repeat Phos Level in the AM   Hyponatremia -Continue with IV fluid hydration with normal saline and added KCl; Na+ was 136 and dropped again and is 133 -Continue to monitor and replete as necessary -Repeat CMP in the a.m.  History of Dementia  -Worsening; Oncology feels he is at his baseline -He continues to complain of being "confused and groggy" -Place on Delirium Precautions -Has a 1:1 Safety Sitter  -Home meds are being held  History of Esophageal Cancer  -followed by Dr. Benay Spice and is being seen inpatient -Appreciate Dr. Gearldine Shown evaluation -EGD showed no esophageal mass noted  Goals of Care -Dr. Rodena Piety discussed with patient's wife, has living will at home.   -CODE STATUS changed to DNR which is appropriate for the patient.   -She also expressed a desire to speak with hospice.  Dr. Zigmund Daniel discussed with [facility] hosp[facility] who will contact her.  DVT prophylaxis: Apparent 40 mg subcu every 24 Code Status: DO NOT RESUSCITATE  Family Communication: Called Wife to update her but she did not pick up the Telephone today  Disposition Plan: Pending further improvement of his orthostatic hypotension and evaulation and management of his Nausea; Still profoundly orthostatic   Status is: Inpatient  Remains inpatient appropriate because:Ongoing diagnostic testing needed not appropriate for outpatient work up  and IV treatments appropriate due to intensity of illness or inability to take PO; Will get a Calorie Count  Dispo: The patient is from: Home              Anticipated d/c is to: SNF              Anticipated d/c date is: 2 days              Patient currently is not medically stable to d/c. given continued Orthostasis    Consultants:   Medical Oncology  Gastroenterology    Procedures:  EGD     Esophagitis was found 35 to 45 cm from the incisors. This was ulcerative       and circumferential. Biopsies were taken with a cold forceps for       histology. There was no obvious mass seen. No stricture.      No gross lesions were noted in the entire examined stomach. There is a       PEG  TUBE which looks good.      The examined duodenum was normal. Impression:               - Esophagitis. Biopsied. NO OBVIOUS ESOPHAGEAL MASS                           - No gross lesions in the stomach.                           - Normal examined duodenum.   Antimicrobials:  Anti-infectives (From admission, onward)   Start     Dose/Rate Route Frequency Ordered Stop   08/27/19 0200  cefTRIAXone (ROCEPHIN) 2 g in sodium chloride 0.9 % 100 mL IVPB     2 g 200 mL/hr over 30 Minutes Intravenous Every 24 hours 08/26/19 0411 08/31/19 0233   08/26/19 0500  azithromycin (ZITHROMAX) 500 mg in sodium chloride 0.9 % 250 mL IVPB     500 mg 250 mL/hr over 60 Minutes Intravenous Every 24 hours 08/26/19 0411 08/30/19 0520   08/26/19 0115  cefTRIAXone (ROCEPHIN) 2 g in sodium chloride 0.9 % 100 mL IVPB     2 g 200 mL/hr over 30 Minutes Intravenous  Once 08/26/19 0106 08/26/19 0244     Subjective: Seen and examined at bedside and is fatigued appearing. No nausea or vomiting. Still continues to be orthostatic. States that he is sleepy. No other concerns or complaints at this time.   Objective: Vitals:   09/01/19 1817 09/01/19 2020 09/02/19 0356 09/02/19 1304  BP: 140/81 (!) 149/85 139/82 (!) 150/92  Pulse: 83 79  78 83  Resp: 20 20 19 18   Temp: (!) 97.4 F (36.3 C) 97.9 F (36.6 C) 97.6 F (36.4 C) 97.8 F (36.6 C)  TempSrc: Oral  Oral Oral  SpO2: 100% 100% 98%   Weight:   100.2 kg   Height:        Intake/Output Summary (Last 24 hours) at 09/02/2019 1735 Last data filed at 09/02/2019 1610 Gross per 24 hour  Intake 611.3 ml  Output --  Net 611.3 ml   Filed Weights   08/31/19 0919 09/01/19 0428 09/02/19 0356  Weight: 103.4 kg 101.2 kg 100.2 kg   Examination: Physical Exam:  Constitutional: Caucasian male currently in no acute distress appears calm and fatigued Eyes: Lids and conjunctivae normal, sclerae anicteric; as a cut above his left eye ENMT: External Ears, Nose appear normal. Grossly normal hearing. Neck: Appears normal, supple, no cervical masses, normal ROM, no appreciable thyromegaly; no JVD Respiratory: Diminished to auscultation bilaterally, no wheezing, rales, rhonchi or crackles. Normal respiratory effort and patient is not tachypenic. No accessory muscle use. Unlabored breathing Cardiovascular: RRR, no murmurs / rubs / gallops. S1 and S2 auscultated.  Abdomen: Soft, non-tender, distended secondary body habitus. Bowel sounds present x4. PEG in place.  GU: Deferred. Musculoskeletal: No clubbing / cyanosis of digits/nails. No joint deformity upper and lower extremities.  Skin: No rashes, lesions, ulcers on limited skin evaluation. No induration; Warm and dry.  Neurologic: CN 2-12 grossly intact with no focal deficits. Romberg sign and cerebellar reflexes not assessed.  Psychiatric: Normal judgment and insight. Slightly somnolent and drowsy but when he is awake he is and oriented x 3. Fatigued appearing but has a normal mood and appropriate affect.   Data Reviewed: I have personally reviewed following labs and imaging studies  CBC: Recent Labs  Lab 08/29/19  9629 08/30/19 0540 08/31/19 0539 09/01/19 0543 09/02/19 0457  WBC 6.6 6.8 9.3 6.8 6.6  NEUTROABS 5.1 5.4 7.7 5.3  5.2  HGB 11.1* 11.8* 12.1* 11.7* 11.4*  HCT 33.6* 34.6* 36.6* 35.3* 34.7*  MCV 88.0 85.6 87.1 88.7 87.2  PLT 140* 159 160 141* 528   Basic Metabolic Panel: Recent Labs  Lab 08/29/19 0549 08/30/19 0540 08/31/19 0539 09/01/19 0543 09/02/19 0457  NA 131* 131* 133* 136 133*  K 3.1* 3.3* 3.3* 3.2* 3.5  CL 98 103 104 102 101  CO2 25 22 22 25 23   GLUCOSE 113* 109* 128* 104* 103*  BUN 6* 5* 6* 6* 7*  CREATININE 0.85 0.84 0.96 0.81 0.99  CALCIUM 9.9 10.0 10.2 10.7* 10.5*  MG 1.8 2.2 2.0 2.0 1.9  PHOS 3.1 2.1* 3.3 3.2 2.6   GFR: Estimated Creatinine Clearance: 85.3 mL/min (by C-G formula based on SCr of 0.99 mg/dL). Liver Function Tests: Recent Labs  Lab 08/29/19 0549 08/30/19 0540 08/31/19 0539 09/01/19 0543 09/02/19 0457  AST 42* 45* 34 38 31  ALT 42 52* 47* 49* 45*  ALKPHOS 52 51 51 54 53  BILITOT 0.6 1.0 0.8 0.8 1.2  PROT 5.3* 5.6* 5.5* 5.5* 5.4*  ALBUMIN 2.8* 2.9* 2.9* 3.0* 3.0*   No results for input(s): LIPASE, AMYLASE in the last 168 hours. No results for input(s): AMMONIA in the last 168 hours. Coagulation Profile: No results for input(s): INR, PROTIME in the last 168 hours. Cardiac Enzymes: No results for input(s): CKTOTAL, CKMB, CKMBINDEX, TROPONINI in the last 168 hours. BNP (last 3 results) No results for input(s): PROBNP in the last 8760 hours. HbA1C: No results for input(s): HGBA1C in the last 72 hours. CBG: Recent Labs  Lab 09/01/19 2337 09/02/19 0400 09/02/19 0745 09/02/19 1134 09/02/19 1727  GLUCAP 90 100* 101* 101* 106*   Lipid Profile: No results for input(s): CHOL, HDL, LDLCALC, TRIG, CHOLHDL, LDLDIRECT in the last 72 hours. Thyroid Function Tests: Recent Labs    09/02/19 0457  TSH 1.948   Anemia Panel: Recent Labs    08/31/19 0539  VITAMINB12 1,478*  FOLATE 5.9*  FERRITIN 225  TIBC 265  IRON 29*  RETICCTPCT 2.9   Sepsis Labs: No results for input(s): PROCALCITON, LATICACIDVEN in the last 168 hours.  Recent Results (from  the past 240 hour(s))  Blood culture (routine x 2)     Status: None   Collection Time: 08/25/19 11:00 PM   Specimen: BLOOD  Result Value Ref Range Status   Specimen Description   Final    BLOOD LEFT ANTECUBITAL Performed at [facility] [address], [place], [place] [address]    Special Requests   Final    BOTTLES DRAWN AEROBIC AND ANAEROBIC Blood Culture adequate volume Performed at [facility] [address] [place], [place] [address]    Culture   Final    NO GROWTH 5 DAYS Performed at [facility], [place] [address] [place], [place] [address]    Report Status 08/31/2019 FINAL  Final  Blood culture (routine x 2)     Status: None   Collection Time: 08/25/19 11:04 PM   Specimen: BLOOD  Result Value Ref Range Status   Specimen Description   Final    BLOOD LEFT HAND Performed at [facility] [address], [place], [place] [address]    Special Requests   Final    BOTTLES DRAWN AEROBIC AND ANAEROBIC Blood Culture adequate volume Performed  at [facility], [place] [address] [place], [place] [address]    Culture   Final    NO GROWTH 5 DAYS Performed at [facility], [place] [address] [place], [place] [address]    Report Status 08/31/2019 FINAL  Final  Urine Culture     Status: Abnormal   Collection Time: 08/26/19 12:58 AM   Specimen: Urine, Random  Result Value Ref Range Status   Specimen Description   Final    URINE, RANDOM Performed at [facility] [place] [address], [place], [place] [address]    Special Requests   Final    NONE Performed at [facility], [place] [address], [place], [place] [address]    Culture (A)  Final    <10,000 COLONIES/mL INSIGNIFICANT GROWTH Performed at [place] [address], [place], [place] [address]    Report Status 08/27/2019 FINAL  Final  SARS Coronavirus 2 by RT PCR (hospital order, performed in [facility] hospital lab) Nasopharyngeal Nasopharyngeal Swab     Status: None   Collection Time: 08/26/19  1:06 AM   Specimen: Nasopharyngeal Swab  Result Value Ref Range Status   SARS Coronavirus 2 NEGATIVE NEGATIVE Final    Comment: (NOTE) SARS-CoV-2 target nucleic acids are NOT DETECTED. The SARS-CoV-2 RNA is generally detectable in upper and lower respiratory specimens during the acute phase of infection. The lowest concentration of SARS-CoV-2 viral copies this assay can detect is 250 copies / mL. A negative result does not preclude SARS-CoV-2 infection and should not be used as the sole basis for treatment or other patient management decisions.  A negative result may occur with improper specimen collection / handling, submission of specimen other than nasopharyngeal swab, presence of viral mutation(s) within the areas targeted by this assay, and inadequate number of viral copies (<250 copies / mL). A negative result must be combined with clinical observations, patient history, and epidemiological information. Fact Sheet for Patients:   StrictlyIdeas.no Fact Sheet for Healthcare Providers: BankingDealers.co.za This test is not yet approved or cleared  by the [place] FDA and has been authorized for detection and/or diagnosis of SARS-CoV-2 by FDA under an Emergency Use Authorization (EUA).  This EUA will remain in effect (meaning this test can be used) for the duration of the COVID-19 declaration under Section 564(b)(1) of the Act, 21 U.S.C. section 360bbb-3(b)(1), unless the authorization is terminated or revoked sooner. Performed at [facility], [place] [address], [place], [place] [address]     RN Pressure Injury Documentation:     Estimated body mass index is 28.37 kg/m as calculated from the following:   Height as of this encounter: 6\' 2"  (1.88 m).   Weight as of this encounter: 100.2 kg.  Malnutrition  Type:  Nutrition Problem: Increased nutrient needs Etiology: cancer and cancer related treatments   Malnutrition Characteristics:  Signs/Symptoms: estimated needs   Nutrition Interventions:  Interventions: [place])   Radiology Studies: No results found. Scheduled Meds: . buPROPion  150 mg Oral Daily  . [START ON 09/03/2019] Droxidopa  100 mg Oral TID  . enoxaparin (LOVENOX) injection  40 mg Subcutaneous Q24H  . escitalopram  20 mg Oral Daily  . feeding supplement (KATE FARMS STANDARD 1.4)  480 mL Per Tube Q24H  . [START ON 09/03/2019] fludrocortisone  0.2 mg Oral Daily  . iron polysaccharides  150 mg Oral Daily  . midodrine  10 mg Oral TID WC  . pantoprazole  sodium  40 mg Oral BID  . saccharomyces boulardii  250 mg Oral BID  . sucralfate  1 g Oral TID WC & HS  . tamsulosin  0.4 mg Oral QHS  . vitamin B-12  1,000 mcg Oral Daily   Continuous Infusions: . 0.9 % NaCl with KCl 40 mEq / L 75 mL/hr (09/01/19 2004)    LOS: 7 days   Kerney Elbe, DO [facility] PAGER is on [place]  If 7PM-7AM, please contact night-coverage [email or web address]

## 2019-09-02 NOTE — Progress Notes (Signed)
[facility] Progress Note  David [name] 74 y.o. Apr 05, 1946  CC:  Esophagitis  Subjective: Patient feels well today.  Denies any nausea or abdominal pain.  Continues to cough up some phlegm.  ROS : Review of Systems  Respiratory: Positive for cough. Negative for shortness of breath.   Gastrointestinal: Positive for nausea. Negative for abdominal pain, blood in stool, constipation, diarrhea, heartburn, melena and vomiting.    Objective: Vital signs in last 24 hours: Vitals:   09/01/19 2020 09/02/19 0356  BP: (!) 149/85 139/82  Pulse: 79 78  Resp: 20 19  Temp: 97.9 F (36.6 C) 97.6 F (36.4 C)  SpO2: 100% 98%    Physical Exam:  General:  Alert, cooperative, no distress, appears stated age  Head:  Normocephalic, without obvious abnormality, atraumatic  Eyes:  Anicteric sclera, EOMs intact  Lungs:   Clear to auscultation bilaterally, respirations unlabored  Heart:  Regular rate and rhythm, S1/S2 normal  Abdomen:   Soft, non-tender, normoactive bowel sounds,  no guarding or peritoneal signs   Extremities: Extremities normal, atraumatic, no  edema  Pulses: 2+ and symmetric    Lab Results: Recent Labs    09/01/19 0543 09/02/19 0457  NA 136 133*  K 3.2* 3.5  CL 102 101  CO2 25 23  GLUCOSE 104* 103*  BUN 6* 7*  CREATININE 0.81 0.99  CALCIUM 10.7* 10.5*  MG 2.0 1.9  PHOS 3.2 2.6   Recent Labs    09/01/19 0543 09/02/19 0457  AST 38 31  ALT 49* 45*  ALKPHOS 54 53  BILITOT 0.8 1.2  PROT 5.5* 5.4*  ALBUMIN 3.0* 3.0*   Recent Labs    09/01/19 0543 09/02/19 0457  WBC 6.8 6.6  NEUTROABS 5.3 5.2  HGB 11.7* 11.4*  HCT 35.3* 34.7*  MCV 88.7 87.2  PLT 141* 153   No results for input(s): LABPROT, INR in the last 72 hours.    Assessment: Esophagitis, history of esophageal cancer EGD 08/31/19 showed Esophagitis (biopsy pending) but NO OBVIOUS ESOPHAGEAL MASS  Plan: Await pathology Continue Protonix BID Continue Carafate TID and HS Full  liquid diet ordered.  Advance diet as tolerated.  [facility] will sign off.  Please contact us if we can be of any further assistance during this hospital stay.  Salley Slaughter PA-C 09/02/2019, 11:53 AM  Contact #  [phone]

## 2019-09-02 NOTE — Consult Note (Signed)
Cardiology Consultation:   Patient ID: David [name] MRN: [id number]; DOB: 08-11-1945  Admit date: 08/25/2019 Date of Consult: 09/02/2019  Primary Care Provider: Leighton Ruff, MD Primary Cardiologist: New - Dr. Oval Linsey Primary Electrophysiologist:  None    Patient Profile:   David [name] is a 74 y.o. male with a hx of esophageal cancer, dementia, depression and history of syncope who is being seen today for the evaluation of orthostatic hypotension at the request of Dr. Alfredia Ferguson.  History of Present Illness:   David [name] is a 74 year old male with past medical history of esophageal cancer, dementia, depression and history of syncope.  He does not have any known cardiac history.  He apparently was seen by Dr. Melvern Banker during hospitalization in 2004, however record was not available to me and patient does not remember if he was diagnosed with any cardiac issue.  He has not been seen by cardiology service since.  Patient was diagnosed with esophageal cancer in January 2021 after CT imaging noted circumferential thickening of the distal esophagus.  Upper endoscopy performed on 06/11/2019 showed ulcerated mass at the lower esophagus, biopsy revealed atypical cell which suggest reactive changes versus poorly differentiated neoplasm.  Repeat endoscopy performed on 06/21/2019 showed partially obstructing esophageal tumor in the lower third of the esophagus.  PET scan on 07/05/2019 showed a large esophageal mass with nodal involvement both above and below the diaphragm.  Palliative radiation was pursued for 2 weeks in the second half of April 2021.  He was also placed on pembrolizumab in early May 2021.  Patient was first diagnosed with orthostatic hypotension on 08/06/2019 when he presented to his oncology visit and subsequently had significant dizziness, heart rate dropped down to the 40s and gradually came up to the 70s prior to vomiting.  There was also reports of diaphoresis and irregular heartbeat.   IV fluid was started in the clinic prior to his subsequent hospitalization.  He apparently passed out several times.  He was admitted from 4/27-5/4 for syncope and dehydration.  His Aricept and Namenda were held during the hospitalization due to concern that they could lead to orthostatic hypotension.  CT was unremarkable.  MRI was negative for acute abnormality.  Echocardiogram showed normal EF 60 to 65% without wall motion abnormality, normal right ventricle.  He was placed on TED hose and abdominal binder however continue to be orthostatic.  Midodrine was started during the admission and it uptitrated to 10 mg 3 times a day.  He was aggressively hydrated with IV fluid however continued to be orthostatic.  He was eventually discharged to a skilled nursing facility.  Patient was readmitted to the hospital on 08/26/2019 with persistent nausea and vomiting.  He had increasing confusion and frequent urination.  Patient also reportedly had some low-grade fever as well.  He is extremely dizzy when he tried to stand up.  During this admission, CT of the head was repeated which remained unremarkable.  Additional work-up showed possible pneumonia and a urinary tract infection.  Patient was treated with antibiotic.  Acute encephalopathy gradually improved.  Nausea vomiting initially got better however later worsened.  GI service was consulted, EGD demonstrated esophagitis likely related to recent radiation therapy.  Albumin was initially low however gradually improving over time as well.  Despite progressive hydration, midodrine, Florinef and TED hose, patient continued to have significant orthostatic hypotension.  Cardiology service was consulted to evaluate the cause behind orthostatic hypotension.  Talking with the patient, he denies any recent chest pain  or shortness of breath.   Past Medical History:  Diagnosis Date  . Anxiety   . BPH (benign prostatic hyperplasia)   . Cancer ([place])   . Dementia ([place])   .  Depression   . Hyperlipidemia   . Hypertension   . Kidney stone   . Memory loss   . 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 Sleep apnea    CPAP  . Vitamin D deficiency     Past Surgical History:  Procedure Laterality Date  . CATARACT EXTRACTION, BILATERAL  2018  . EXTERNAL EAR SURGERY     child  . EYE SURGERY Bilateral    cataract  . IR GASTROSTOMY TUBE MOD SED  07/08/2019  . right knee meniscus  06/12/2008  . right talus repair     dislocated     Home Medications:  Prior to Admission medications   Medication Sig Start Date End Date Taking? Authorizing Provider  buPROPion (WELLBUTRIN XL) 150 MG 24 hr tablet TAKE 1 TABLET ONCE DAILY. Patient taking differently: Take 150 mg by mouth daily.  07/18/19  Yes [name], PMHNP  Cholecalciferol (VITAMIN D3 PO) Take 2,000 Units by mouth daily.   Yes [provider]  escitalopram (LEXAPRO) 20 MG tablet TAKE 1 TABLET ONCE DAILY. Patient taking differently: Take 20 mg by mouth daily.  07/18/19  Yes [name], PMHNP  esomeprazole (NEXIUM) 20 MG packet Take 20 mg by mouth daily. 07/25/19  Yes [provider]  HYDROcodone-acetaminophen (NORCO/VICODIN) 5-325 MG tablet Take 1 tablet by mouth every 6 (six) hours as needed for moderate pain. 08/13/19  Yes Eugenie Filler, MD  LORazepam (ATIVAN) 0.5 MG tablet Take 1 tablet (0.5 mg total) by mouth every 6 (six) hours as needed for anxiety. 08/13/19  Yes Eugenie Filler, MD  midodrine (PROAMATINE) 10 MG tablet Take 1 tablet (10 mg total) by mouth 3 (three) times daily with meals. 08/23/19  Yes Ladell Pier, MD  Nutritional Supplements (FEEDING SUPPLEMENT, KATE FARMS STANDARD 1.4,) LIQD liquid 4 per day (1 bolus, 3 feeding pump) 80 ml/hr X 12 hrs  1820 calories/day or 4 cans/day 07/26/19  Yes Ladell Pier, MD  ondansetron (ZOFRAN) 8 MG tablet Take 1 tablet (8 mg total) by mouth every 8 (eight) hours as needed for nausea or vomiting. 08/22/19  Yes Owens Shark, NP  prochlorperazine (COMPAZINE) 5 MG tablet Take 1 tablet (5 mg total) by mouth every 6 (six) hours as needed for nausea or vomiting. May take orally or crush and take via tube. 07/16/19  Yes Tanner, Lyndon Code., PA-C  simvastatin (ZOCOR) 40 MG tablet Take 40 mg by mouth daily.   Yes [provider]  Tamsulosin HCl (FLOMAX) 0.4 MG CAPS Take 0.4 mg by mouth at bedtime.    Yes [provider]  vitamin B-12 (CYANOCOBALAMIN) 1000 MCG tablet Take 1,000 mcg by mouth daily.   Yes [provider]    Inpatient Medications: Scheduled Meds: . buPROPion  150 mg Oral Daily  . enoxaparin (LOVENOX) injection  40 mg Subcutaneous Q24H  . escitalopram  20 mg Oral Daily  . feeding supplement (KATE FARMS STANDARD 1.4)  325 mL Oral BID BM  . fludrocortisone  0.1 mg Oral Daily  . iron polysaccharides  150 mg Oral Daily  . midodrine  10 mg Oral TID WC  . pantoprazole sodium  40 mg Oral BID  . saccharomyces boulardii  250 mg Oral BID  . sucralfate  1 g Oral  TID WC & HS  . tamsulosin  0.4 mg Oral QHS  . vitamin B-12  1,000 mcg Oral Daily   Continuous Infusions: . 0.9 % NaCl with KCl 40 mEq / L 75 mL/hr (09/01/19 2004)   PRN Meds: acetaminophen, LORazepam, ondansetron **OR** ondansetron (ZOFRAN) IV  Allergies:    Allergies  Allergen Reactions  . Other     Other reaction(s): Other Sleep walk   . Abilify [Aripiprazole] Other (See Comments)    Short term memory loss  . Lamictal [Lamotrigine]     Increased agitation    Social History:   Social History   Socioeconomic History  . Marital status: Legally Separated    Spouse name: [name]  . Number of children: 2  . Years of education: 26  . 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 Smoking status: Never Smoker  . Smokeless tobacco: Never Used  Substance and Sexual Activity  . Alcohol use: Yes    Alcohol/week: 1.0 - 2.0 standard drinks    Types: 1 - 2  Shots of liquor per week    Comment: 1-2 shots 4-5 x week  . Drug use: Yes    Types: Marijuana    Comment: Occasional  . Sexual activity: Never  Other Topics Concern  . Not on file  Social History Narrative   Lives alone retired.  Education: BS, MS Phd chemist.  Separated 8 yrs a/o spring 2020.  Adult children son, out of home, and daughter, adult with autistic spectrum, lives with estranged wife.  2 gks.  Mother died at age 62, of COVID, in the [place] outbreak -- emotionally destabilizing event late May/early June.        Cokes 0-3 daily as reported by nursing Sept 2019.  Gets medications via blister pack prepared by pharmacy a/o spring 2020, with generally good compliance but some mixing up of times and dates.   Social Determinants of Health   Financial Resource Strain:   . Difficulty of Paying Living Expenses:   Food Insecurity:   . Worried About Charity fundraiser in the Last Year:   . Arboriculturist in the Last Year:   Transportation Needs:   . Film/video editor (Medical):   Marland Kitchen Lack of Transportation (Non-Medical):   Physical Activity:   . Days of Exercise per Week:   . Minutes of Exercise per Session:   Stress:   . Feeling of Stress :   Social Connections:   . Frequency of Communication with Friends and Family:   . Frequency of Social Gatherings with Friends and Family:   . Attends Religious Services:   . Active Member of Clubs or Organizations:   . Attends Archivist Meetings:   Marland Kitchen Marital Status:   Intimate Partner Violence:   . Fear of Current or Ex-Partner:   . Emotionally Abused:   Marland Kitchen Physically Abused:   . Sexually Abused:     Family History:    Family History  Problem Relation Age of Onset  . Alzheimer's disease Mother   . Depression Mother   . Cancer Father        prostate  . Thyroid disease Father   . Alzheimer's disease Maternal Grandmother   . Heart disease Maternal Grandfather   . Stroke Paternal Grandmother   . Stroke Paternal  Grandfather   . Alcoholism Brother   . Autism Daughter   . Autism  Maternal Uncle      ROS:  Please see the history of present illness.   All other ROS reviewed and negative.     Physical Exam/Data:   Vitals:   09/01/19 1400 09/01/19 1817 09/01/19 2020 09/02/19 0356  BP: (!) 150/94 140/81 (!) 149/85 139/82  Pulse: 73 83 79 78  Resp: 17 20 20 19   Temp: 97.8 F (36.6 C) (!) 97.4 F (36.3 C) 97.9 F (36.6 C) 97.6 F (36.4 C)  TempSrc: Oral Oral  Oral  SpO2: 99% 100% 100% 98%  Weight:    100.2 kg  Height:        Intake/Output Summary (Last 24 hours) at 09/02/2019 0803 Last data filed at 09/02/2019 0332 Gross per 24 hour  Intake 731.3 ml  Output 1200 ml  Net -468.7 ml   Last 3 Weights 09/02/2019 09/01/2019 08/31/2019  Weight (lbs) 221 lb 223 lb 227 lb 15.3 oz  Weight (kg) 100.245 kg 101.152 kg 103.4 kg  Some encounter information is confidential and restricted. Go to Review Flowsheets activity to see all data.     Body mass index is 28.37 kg/m.  General:  Well nourished, well developed, in no acute distress HEENT: normal Lymph: no adenopathy Neck: no JVD Endocrine:  No thryomegaly Vascular: No carotid bruits; FA pulses 2+ bilaterally without bruits  Cardiac:  normal S1, S2; RRR; no murmur  Lungs:  clear to auscultation bilaterally, no wheezing, rhonchi or rales  Abd: soft, nontender, no hepatomegaly  Ext: no edema Musculoskeletal:  No deformities, BUE and BLE strength normal and equal Skin: warm and dry  Neuro:  CNs 2-12 intact, no focal abnormalities noted Psych:  Normal affect   EKG:  The EKG was personally reviewed and demonstrates: Normal sinus rhythm without significant ST-T wave changes Telemetry:  Telemetry was personally reviewed and demonstrates: Normal sinus rhythm, no significant ventricular ectopy.  Relevant CV Studies:  Echocardiogram obtained on 08/07/2019, full report was not available.  Verbal report was given to hospitalist during the  admission.  Laboratory Data:  High Sensitivity Troponin:  No results for input(s): TROPONINIHS in the last 720 hours.   Chemistry Recent Labs  Lab 08/31/19 0539 09/01/19 0543 09/02/19 0457  NA 133* 136 133*  K 3.3* 3.2* 3.5  CL 104 102 101  CO2 22 25 23   GLUCOSE 128* 104* 103*  BUN 6* 6* 7*  CREATININE 0.96 0.81 0.99  CALCIUM 10.2 10.7* 10.5*  GFRNONAA >60 >60 >60  GFRAA >60 >60 >60  ANIONGAP 7 9 9     Recent Labs  Lab 08/31/19 0539 09/01/19 0543 09/02/19 0457  PROT 5.5* 5.5* 5.4*  ALBUMIN 2.9* 3.0* 3.0*  AST 34 38 31  ALT 47* 49* 45*  ALKPHOS 51 54 53  BILITOT 0.8 0.8 1.2   Hematology Recent Labs  Lab 08/31/19 0539 09/01/19 0543 09/02/19 0457  WBC 9.3 6.8 6.6  RBC 4.20*  4.12* 3.98* 3.98*  HGB 12.1* 11.7* 11.4*  HCT 36.6* 35.3* 34.7*  MCV 87.1 88.7 87.2  MCH 28.8 29.4 28.6  MCHC 33.1 33.1 32.9  RDW 13.7 13.7 13.7  PLT 160 141* 153   BNPNo results for input(s): BNP, PROBNP in the last 168 hours.  DDimer No results for input(s): DDIMER in the last 168 hours.   Radiology/Studies:  DG CHEST PORT 1 VIEW  Result Date: 08/31/2019 CLINICAL DATA:  Shortness of breath EXAM: PORTABLE CHEST 1 VIEW COMPARISON:  08/25/2019 FINDINGS: Normal heart size. Convexity of the upper right mediastinum from adenopathy  by PET CT. There is no edema, consolidation, effusion, or pneumothorax. IMPRESSION: 1. No acute finding. 2. Known thoracic adenopathy. Electronically Signed   By: Monte Fantasia M.D.   On: 08/31/2019 07:34   {   Assessment and Plan:   1. Orthostatic hypotension:   -Patient was admitted for significant orthostatic hypotension and multiple syncope in late April 2021.  He was aggressively hydrated and was started on midodrine 10 mg 3 times daily, TED hose and abdominal binder and was subsequently discharged  -He returned during this hospitalization due to altered mental status, low-grade fever, dizziness and feeling of passing out.  -Altered mental status has  improved after he was treated for pneumonia.  -During the previous hospitalization, there was some concern that his orthostatic hypotension was triggered by medication.  Aricept and Namenda were discontinued.  -Recent CT of abdomen reviewed, he does have calcified lesion attached to the aortic wall in the abdomen, however this is spotty at best.  -On arrival, there was some report of frequent urination.  It is not clear to me if I/O is documented accurately enough to evaluate for diabetes insipidus.  He was treated for urinary tract infection which is more likely to be the cause for the frequent urination.  Will defer to MD to consider testing for ADH level.  Albumin was low at 2.9, however gradually improving.   -Most likely cause for his marked orthostatic hypotension is autonomic dysfunction.  This likely has been going on for a long time according to the patient.  Continue on highest dose of midodrine, TED hose, IV hydration, abdominal binder, Florinef.  Will discuss with MD regarding additional testing or consider Northera. Patient denies any prior h/o parkinson vs Alzheimer's disease.   2. Altered mental status likely related to UTI and pneumonia: Treated by primary team   3. nausea and vomiting: Seen by GI service.  EGD demonstrated esophagitis likely related to recent radiation therapy  4. Esophageal cancer: Distal esophageal mass resolved on the recent endoscopy performed on 08/31/2019 after palliative radiation therapy  5. Dementia  6. History of syncope: Related to #1      For questions or updates, please contact [place] Please consult [email or web address] for contact info under     Hilbert Corrigan, [place]  09/02/2019 8:03 AM

## 2019-09-02 NOTE — Progress Notes (Signed)
Nutrition Follow-up  INTERVENTION:   -Resume [place] 1.4 @ 20 ml/hr via PEG  NUTRITION DIAGNOSIS:   Increased nutrient needs related to cancer and cancer related treatments as evidenced by estimated needs.  Ongoing.  GOAL:   Patient will meet greater than or equal to 90% of their needs  Not meeting.  MONITOR:   PO intake, Supplement acceptance, Labs, Weight trends, I & O's  REASON FOR ASSESSMENT:   Consult Enteral/tube feeding initiation and management  ASSESSMENT:   74 y.o. male with history of esophageal cancer PEG tube placement BPH dementia depression who was recently admitted for syncope on August 06, 2019 discharged on Aug 13, 2019 was observed to be increasingly weak over the last 4 to 5 days and getting more confused.  Patient also had some vomiting and patient's oncologist Dr. Learta Codding advised to stop the enteral feeds for 2 days.  And it was restarted again 2 days ago following which later in the evening patient had some vomiting patient wife also noticed some fevers around around 99 degrees.  **RD working remotelyNational Oilwell [employer] results  5/21: B: 0% L: 300 kcals, 16g protein D: 120 kcals, 8g protein Supplements: 455 kcals, 20g protein Total: 875 kcals ( 38% of needs), 44g protein (38% of needs)  On 5/22, pt was NPO for endoscopy then placed on clear liquids.   Pt continues on clears. EGD from 5/22 revealed esophagitis and GI states vomiting is most likely related to this. TF to be resumed today via PEG at low rate and slowly advanced. Can transition back to night feed regimen if able to tolerate 24 hour feeds.  Admission weight: 213 lbs. Current weight: 221 lbs.  I/Os: +1.6L since admit UOP: 1.2L x 24 hrs  Medications: Niferex, Florastor, Carafate, Vitamin B-12 Labs reviewed: CBGs: 100-101 Low Na  Diet Order:   Diet Order            Diet clear liquid Room service appropriate? Yes; Fluid consistency: Thin  Diet effective now               EDUCATION NEEDS:   No education needs have been identified at this time  Skin:  Skin Assessment: Reviewed RN Assessment  Last BM:  5/23 -type 6 &7  Height:   Ht Readings from Last 1 Encounters:  08/25/19 6\' 2"  (1.88 m)    Weight:   Wt Readings from Last 1 Encounters:  09/02/19 100.2 kg    Ideal Body Weight:     BMI:  Body mass index is 28.37 kg/m.  Estimated Nutritional Needs:   Kcal:  2300-2500  Protein:  115-130g  Fluid:  2.3L/day  Clayton Bibles, MS, RD, LDN Inpatient Clinical Dietitian Contact information available via Amion

## 2019-09-02 NOTE — Progress Notes (Signed)
0Physical Therapy Treatment Patient Details Name: David [name] MRN: [id number] DOB: 25-Jan-1946 Today's Date: 09/02/2019    History of Present Illness 74 y.o. male with medical history significant of esophageal cancer on palliative chemotherapy, hypertension, dementia, hyperlipidemia, BPH and anxiety disorder and admitted with AMS/fever, found to have UTI and possible PNA .  Pt with recent admission for presyncope likely secondary to orthostasis.    PT Comments    Patient continues to be limited by symptomatic orthostatic hypotension with mobility. He is able to tolerate supine to sit transfer without significan drop in BP however does have a significant increase in HR and reports dizziness. In standing BP dropped to 80/64 with HR elevated significantly to 139 bpm. He was noted to be slightly diaphoretic after standing and has slight Rt UE/LE tremor during activity. He required min assist to return to supine and was placed in trendelenburg to improve symptoms. Pt was gradually raised up so he could continue PEG feeding and drink water, BP improved (see below).    Orthostatic VS for the past 24 hrs:  BP- Lying Pulse- Lying BP- Sitting Pulse- Sitting BP- Standing at 0 minutes Pulse- Standing at 0 minutes  09/02/19 1243 (!) 150/106 87 147/84 103 (!) 80/64 139    Vitals:   09/02/19 1304  BP: (!) 150/92  Pulse: 83     Follow Up Recommendations  Home health PT;Supervision/Assistance - 24 hour;SNF     Equipment Recommendations  None recommended by PT;Rolling walker with 5" wheels(RW pending progress)    Recommendations for Other Services       Precautions / Restrictions Precautions Precautions: Fall Precaution Comments: G tube, orthostatic, Patient fell in bathroom, safety sittin in room. Restrictions Weight Bearing Restrictions: No    Mobility  Bed Mobility Overal bed mobility: Needs Assistance Bed Mobility: Supine to Sit;Sit to Supine     Supine to sit: Min assist;HOB  elevated Sit to supine: Min assist;HOB elevated   General bed mobility comments: min assist to sequence LE mobility and bring LE's off EOB. Assist to raise turnk upright to sit EOB.   Transfers Overall transfer level: Needs assistance Equipment used: Rolling walker (2 wheeled) Transfers: Sit to/from Stand Sit to Stand: Min assist;+2 safety/equipment         General transfer comment: +2 for safety, min assist to initiate power up and rise to RW. Pt reports worsening dizziness/lightheadedness with standing. returned to sit and supine as patient was orthostatic.  Ambulation/Gait    Stairs        Wheelchair Mobility    Modified Rankin (Stroke Patients Only)       Balance Overall balance assessment: Needs assistance Sitting-balance support: Feet supported Sitting balance-Leahy Scale: Good     Standing balance support: Bilateral upper extremity supported Standing balance-Leahy Scale: Fair         Cognition Arousal/Alertness: Awake/alert Behavior During Therapy: WFL for tasks assessed/performed Overall Cognitive Status: History of cognitive impairments - at baseline      General Comments: hx of dementia. pt pleasant and oriented to self, situation, and close for time/place (off by one day and states [facility] than [facility]).       Exercises      General Comments General comments (skin integrity, edema, etc.): BP monitored for orthostatic hypotension. Thigh high ted hose in place. no abdominal binder on. Requested RN to order and place abdominal binder.      Pertinent Vitals/Pain Pain Assessment: No/denies pain  PT Goals (current goals can now be found in the care plan section) Acute Rehab PT Goals Patient Stated Goal: stop feeling dizzy PT Goal Formulation: With patient Time For Goal Achievement: 09/17/19 Potential to Achieve Goals: Good Progress towards PT goals: Not progressing toward goals - comment(pt has been limited by orthostatic  hypotension)    Frequency    Min 3X/week      PT Plan Current plan remains appropriate       AM-PAC PT "6 Clicks" Mobility   Outcome Measure  Help needed turning from your back to your side while in a flat bed without using bedrails?: A Little Help needed moving from lying on your back to sitting on the side of a flat bed without using bedrails?: A Little Help needed moving to and from a bed to a chair (including a wheelchair)?: A Little Help needed standing up from a chair using your arms (e.g., wheelchair or bedside chair)?: A Little Help needed to walk in hospital room?: A Lot Help needed climbing 3-5 steps with a railing? : A Lot 6 Click Score: 16    End of Session Equipment Utilized During Treatment: Gait belt Activity Tolerance: Treatment limited secondary to medical complications (Comment)(orthostatic) Patient left: in bed;with call bell/phone within reach;with bed alarm set;with nursing/sitter in room Nurse Communication: Mobility status PT Visit Diagnosis: Difficulty in walking, not elsewhere classified (R26.2)     Time: 9983-3825 PT Time Calculation (min) (ACUTE ONLY): 34 min  Charges:  $Therapeutic Activity: 23-37 mins                     Verner Mould, DPT Physical Therapist with [facility] [phone]  09/02/2019 3:16 PM

## 2019-09-03 ENCOUNTER — Telehealth: Payer: Self-pay | Admitting: Radiation Oncology

## 2019-09-03 DIAGNOSIS — R55 Syncope and collapse: Secondary | ICD-10-CM

## 2019-09-03 LAB — GLUCOSE, CAPILLARY
Glucose-Capillary: 111 mg/dL — ABNORMAL HIGH (ref 70–99)
Glucose-Capillary: 122 mg/dL — ABNORMAL HIGH (ref 70–99)
Glucose-Capillary: 123 mg/dL — ABNORMAL HIGH (ref 70–99)
Glucose-Capillary: 124 mg/dL — ABNORMAL HIGH (ref 70–99)
Glucose-Capillary: 129 mg/dL — ABNORMAL HIGH (ref 70–99)
Glucose-Capillary: 163 mg/dL — ABNORMAL HIGH (ref 70–99)

## 2019-09-03 LAB — SURGICAL PATHOLOGY

## 2019-09-03 LAB — COMPREHENSIVE METABOLIC PANEL
ALT: 40 U/L (ref 0–44)
AST: 27 U/L (ref 15–41)
Albumin: 3 g/dL — ABNORMAL LOW (ref 3.5–5.0)
Alkaline Phosphatase: 54 U/L (ref 38–126)
Anion gap: 7 (ref 5–15)
BUN: 6 mg/dL — ABNORMAL LOW (ref 8–23)
CO2: 23 mmol/L (ref 22–32)
Calcium: 11.3 mg/dL — ABNORMAL HIGH (ref 8.9–10.3)
Chloride: 103 mmol/L (ref 98–111)
Creatinine, Ser: 0.86 mg/dL (ref 0.61–1.24)
GFR calc Af Amer: >60 mL/min (ref >60)
GFR calc non Af Amer: >60 mL/min (ref >60)
Glucose, Bld: 118 mg/dL — ABNORMAL HIGH (ref 70–99)
Potassium: 3.9 mmol/L (ref 3.5–5.1)
Sodium: 133 mmol/L — ABNORMAL LOW (ref 135–145)
Total Bilirubin: 0.8 mg/dL (ref 0.3–1.2)
Total Protein: 5.6 g/dL — ABNORMAL LOW (ref 6.5–8.1)

## 2019-09-03 LAB — CBC WITH DIFFERENTIAL/PLATELET
Abs Immature Granulocytes: 0.06 10*3/uL (ref 0.00–0.07)
Basophils Absolute: 0 10*3/uL (ref 0.0–0.1)
Basophils Relative: 1 %
Eosinophils Absolute: 0.1 10*3/uL (ref 0.0–0.5)
Eosinophils Relative: 1 %
HCT: 37.1 % — ABNORMAL LOW (ref 39.0–52.0)
Hemoglobin: 12.1 g/dL — ABNORMAL LOW (ref 13.0–17.0)
Immature Granulocytes: 1 %
Lymphocytes Relative: 12 %
Lymphs Abs: 0.9 10*3/uL (ref 0.7–4.0)
MCH: 28.7 pg (ref 26.0–34.0)
MCHC: 32.6 g/dL (ref 30.0–36.0)
MCV: 88.1 fL (ref 80.0–100.0)
Monocytes Absolute: 0.5 10*3/uL (ref 0.1–1.0)
Monocytes Relative: 7 %
Neutro Abs: 6 10*3/uL (ref 1.7–7.7)
Neutrophils Relative %: 78 %
Platelets: 154 10*3/uL (ref 150–400)
RBC: 4.21 MIL/uL — ABNORMAL LOW (ref 4.22–5.81)
RDW: 14.1 % (ref 11.5–15.5)
WBC: 7.5 10*3/uL (ref 4.0–10.5)
nRBC: 0 % (ref 0.0–0.2)

## 2019-09-03 LAB — PHOSPHORUS: Phosphorus: 2.4 mg/dL — ABNORMAL LOW (ref 2.5–4.6)

## 2019-09-03 LAB — MAGNESIUM: Magnesium: 1.9 mg/dL (ref 1.7–2.4)

## 2019-09-03 MED ORDER — SODIUM PHOSPHATES 45 MMOLE/15ML IV SOLN
20.0000 mmol | Freq: Once | INTRAVENOUS | Status: AC
  Administered 2019-09-03: 20 mmol via INTRAVENOUS
  Filled 2019-09-03 (×2): qty 6.67

## 2019-09-03 MED ORDER — ZOLEDRONIC ACID 4 MG/5ML IV CONC
4.0000 mg | Freq: Once | INTRAVENOUS | Status: AC
  Administered 2019-09-03: 4 mg via INTRAVENOUS
  Filled 2019-09-03: qty 5

## 2019-09-03 MED ORDER — KATE FARMS STANDARD 1.4 PO LIQD
650.0000 mL | ORAL | Status: DC
  Administered 2019-09-03: 650 mL
  Filled 2019-09-03 (×2): qty 650

## 2019-09-03 NOTE — Telephone Encounter (Signed)
  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  Interval Since Last Radiation: 7 weeks   07/22/19-08/09/19: The esophagus received palliative therapy to 39 Gy in 13 fractions.  Narrative:  The patient was contacted today for routine follow-up. During treatment he did very well with radiotherapy and did not have significant desquamation. He reports he is still having esophagitis and also nausea and vomiting. He continues with tube feeds, and was recently hospitalized due to encephalopathy from UTI and possible pneumonia. He had been receiving immunotherapy with Dr. Benay Spice as an outpatient, but imaging identified further progression and decisions were made to proceed with hospice care. He's currently at [facility].   Impression/Plan: 1. 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 I spoke with his wife and wanted to extend our resources as much as she needs. She will consider social work consult if needed and also continue these conversations with the hospice team.   Carola Rhine, PAC

## 2019-09-03 NOTE — Progress Notes (Signed)
Chaplain responding to referral from RN / family request.    Providing spiritual support at bedside with pt and spouse.   Spouse is attending to David [name] at bedside - notes that their son was involved in an MVC and has been hospitalized at [facility].  They also have a daughter at home, with whom they are navigating support.     Spiritual resources.    David [name] is Jewish and communicates "I haven't been as connected to my heritage as I would like"  He names hope in connection to family - noting much of his sadness today is missing these connections.  Shares prayers in Hebrew and [place].  Would benefit from awareness and support around [place] traditions

## 2019-09-03 NOTE — TOC Progression Note (Signed)
Transition of Care [facility]) - Progression Note    Patient Details  Name: David [name] MRN: [id number] Date of Birth: 1945/07/16  [facility] CM/SW [place], [place] Phone Number: 09/03/2019, 9:21 AM  Clinical Narrative:  Received call from [facility] with authorization reference [id number], effective today through 5/27 for facility [facility]. TOC will continue to follow during the course of hospitalization.      Expected Discharge Plan: Skilled Nursing Facility Barriers to Discharge: Other (comment)(SNF pending wife's decision)  Expected Discharge Plan and Services Expected Discharge Plan: Mobile                                               Social Determinants of Health (SDOH) Interventions    Readmission Risk Interventions No flowsheet data found.

## 2019-09-03 NOTE — Progress Notes (Addendum)
   Paged pharmacy x2 about medication. NOt stocked this AM.  Called at 1226, and confirmed with a pharmacy tech that they are packaging and dispensing now.    SWhittemore, Therapist, sports . [facility] pharmacy again at 1325. Stated "its in the ready to be sent stage"  Reported "will tube now"   SWhittemore, RN

## 2019-09-03 NOTE — Progress Notes (Addendum)
Nutrition Follow-up  INTERVENTION:   -Advance [employer] 1.4 to 30 ml/hr via PEG  Goal will be night feed regimen of [employer] 1.4 @ 60 ml/hr x 12 hours with pt taking 2 cartons of [employer] PO during the day. This will provide 4 cartons total of [employer] 1.4, 1820 kcals, 80g protein and 936 ml H20.  NUTRITION DIAGNOSIS:   Increased nutrient needs related to cancer and cancer related treatments as evidenced by estimated needs.  Ongoing.  GOAL:   Patient will meet greater than or equal to 90% of their needs  Progressing.  MONITOR:   PO intake, Supplement acceptance, Labs, Weight trends, I & O's  ASSESSMENT:   74 y.o. male with history of esophageal cancer PEG tube placement BPH dementia depression who was recently admitted for syncope on August 06, 2019 discharged on Aug 13, 2019 was observed to be increasingly weak over the last 4 to 5 days and getting more confused.  Patient also had some vomiting and patient's oncologist Dr. Learta Codding advised to stop the enteral feeds for 2 days.  And it was restarted again 2 days ago following which later in the evening patient had some vomiting patient wife also noticed some fevers around around 99 degrees.  5/22: EGD revealed esophagitis  Pt tolerating [employer] 1.4 @ 20 ml/hr, will advance to 30 ml/hr today. Pt now on full liquids. Per GI, diet can be advanced as tolerated.  **Addendum: Per GI nurse navigator at the [facility], pt's wife with questions regarding pt's diarrhea and digestive enzymes. Spoke with pt's wife over the phone and explained plan for tube feedings and diet going forward. Dr. Benay Spice advanced pt's diet to regular to allow more options. Per wife, pt does not like much on the liquid diet.  Will continue to monitor PO intakes.  Admission weight: 213 lbs. Current weight: 220 lbs.  I/Os: +2.1L since admit UOP: 1.55L x 24 hrs  Medications: Niferex, Florastor, Carafate, Vitamin B-12, IV KCl Labs reviewed: CBGs:  123-129 Low Na, Phos Mg WNL   Diet Order:   Diet Order            Diet full liquid Room service appropriate? Yes; Fluid consistency: Thin  Diet effective now              EDUCATION NEEDS:   No education needs have been identified at this time  Skin:  Skin Assessment: Reviewed RN Assessment  Last BM:  5/25 - type 6  Height:   Ht Readings from Last 1 Encounters:  08/25/19 6\' 2"  (1.88 m)    Weight:   Wt Readings from Last 1 Encounters:  09/03/19 100.2 kg    Ideal Body Weight:     BMI:  Body mass index is 28.36 kg/m.  Estimated Nutritional Needs:   Kcal:  2300-2500  Protein:  115-130g  Fluid:  2.3L/day  Clayton Bibles, MS, RD, LDN Inpatient Clinical Dietitian Contact information available via Amion

## 2019-09-03 NOTE — Progress Notes (Signed)
Progress Note  Patient Name: David [name] Date of Encounter: 09/03/2019  Primary Cardiologist: Skeet Latch, MD   Subjective   Patient started on northera today and florinef was increased. Patient does not remember if he felt dizzy when he stood up today. No further N/V. No chest pain.   Inpatient Medications    Scheduled Meds: . buPROPion  150 mg Oral Daily  . Droxidopa  100 mg Oral TID  . enoxaparin (LOVENOX) injection  40 mg Subcutaneous Q24H  . escitalopram  20 mg Oral Daily  . feeding supplement ([employer] STANDARD 1.4)  480 mL Per Tube Q24H  . fludrocortisone  0.2 mg Oral Daily  . iron polysaccharides  150 mg Oral Daily  . midodrine  10 mg Oral TID WC  . pantoprazole sodium  40 mg Oral BID  . saccharomyces boulardii  250 mg Oral BID  . sucralfate  1 g Oral TID WC & HS  . tamsulosin  0.4 mg Oral QHS  . vitamin B-12  1,000 mcg Oral Daily   Continuous Infusions: . 0.9 % NaCl with KCl 40 mEq / L 75 mL/hr (09/03/19 0400)   PRN Meds: acetaminophen, alum & mag hydroxide-simeth, LORazepam, ondansetron **OR** ondansetron (ZOFRAN) IV   Vital Signs    Vitals:   09/02/19 1304 09/02/19 2007 09/03/19 0357 09/03/19 0500  BP: (!) 150/92 (!) 156/89 (!) 153/76   Pulse: 83 76 85   Resp: 18 19 18    Temp: 97.8 F (36.6 C) 97.6 F (36.4 C) 97.7 F (36.5 C)   TempSrc: Oral     SpO2:  99% 93%   Weight:    100.2 kg  Height:        Intake/Output Summary (Last 24 hours) at 09/03/2019 0918 Last data filed at 09/03/2019 0600 Gross per 24 hour  Intake 2088.7 ml  Output 1551 ml  Net 537.7 ml   Last 3 Weights 09/03/2019 09/02/2019 09/01/2019  Weight (lbs) 220 lb 14.4 oz 221 lb 223 lb  Weight (kg) 100.2 kg 100.245 kg 101.152 kg  Some encounter information is confidential and restricted. Go to Review Flowsheets activity to see all data.      Telemetry    NSR, HR 80s, PACs - Personally Reviewed  ECG    No new - Personally Reviewed  Physical Exam   GEN: No acute  distress.   Neck: No JVD Cardiac: RRR, no murmurs, rubs, or gallops.  Respiratory: Clear to auscultation bilaterally. GI: Soft, nontender, non-distended  MS: No edema; No deformity. Neuro:  Nonfocal  Psych: Normal affect   Labs    High Sensitivity Troponin:  No results for input(s): TROPONINIHS in the last 720 hours.    Chemistry Recent Labs  Lab 09/01/19 0543 09/02/19 0457 09/03/19 0553  NA 136 133* 133*  K 3.2* 3.5 3.9  CL 102 101 103  CO2 25 23 23   GLUCOSE 104* 103* 118*  BUN 6* 7* 6*  CREATININE 0.81 0.99 0.86  CALCIUM 10.7* 10.5* 11.3*  PROT 5.5* 5.4* 5.6*  ALBUMIN 3.0* 3.0* 3.0*  AST 38 31 27  ALT 49* 45* 40  ALKPHOS 54 53 54  BILITOT 0.8 1.2 0.8  GFRNONAA >60 >60 >60  GFRAA >60 >60 >60  ANIONGAP 9 9 7      Hematology Recent Labs  Lab 09/01/19 0543 09/02/19 0457 09/03/19 0553  WBC 6.8 6.6 7.5  RBC 3.98* 3.98* 4.21*  HGB 11.7* 11.4* 12.1*  HCT 35.3* 34.7* 37.1*  MCV 88.7 87.2 88.1  MCH 29.4 28.6 28.7  MCHC 33.1 32.9 32.6  RDW 13.7 13.7 14.1  PLT 141* 153 154    BNPNo results for input(s): BNP, PROBNP in the last 168 hours.   DDimer No results for input(s): DDIMER in the last 168 hours.   Radiology    No results found.  Cardiac Studies   Echo 08/07/19 EF 60-65% with no WMA  Patient Profile     74 y.o. male with pmh of esophageal cancer s/p palliative XRT recently started on Keytruda, dementia, depression who is being seen for orthostatic hypotension and near syncope.   Assessment & Plan    Orthostatic hypotension/Near syncope - Episode in April 4/27 (possible vasovagal) at oncologist apt where BP dropped and heart rate into the 40s. Since then has had several syncope episodes.  - Admitted 4/27-5/4 and has been given IFV but continued to have syncope. Started on midodrin 10mg  TID and TED hose placed with abdominal binder but still symptomatic. EF 60-65% - Patient was admitted after persistent nausea and vomiting.  - Yesterday patient did  had positive orthostatic vitals however unsure if this is orthostatic or neurocardiogenic in the setting of N/V - Florinef increased to 0.2 mg BID - has been on IVF. Bolus yesterday - Northera 100 mg TID started today - Thigh high hose ordered - Orthostatics have not been performed today - MD to see  AMS with underlying Dementia - AMS likely related to PNA and UTI - per IM  N/V - GI saw and EGD saw esophagitis likely from recent radiation - Symptoms improving. GI signed off  Esophageal cancer - esophageal mass resolved  Goals of care - patient is DNR  For questions or updates, please contact [place] Please consult [email or web address] for contact info under        Signed, Trystian Crisanto Ninfa Meeker, PA-C  09/03/2019, 9:18 AM

## 2019-09-03 NOTE — Progress Notes (Signed)
Patient reported dizziness ONLY while on the bedside commode having a bowel movement.   He did not report dizziness with standing.   Will continue to monitor patient.     SWhittemore, Therapist, sports

## 2019-09-03 NOTE — Progress Notes (Signed)
PROGRESS NOTE    David [name]  YIR:[id number] DOB: [birth year] DOA: 08/25/2019 PCP: David Ruff, MD   Brief Narrative:  HPI per Dr. Gean [name] on 08/25/19 David [name] is a 74 y.o. male with history of esophageal cancer PEG tube placement BPH dementia depression who was recently admitted for syncope on August 06, 2019 discharged on Aug 13, 2019 was observed to be increasingly weak over the last 4 to 5 days and getting more confused.  Patient also had some vomiting and patient's oncologist Dr. Learta [name] advised to stop the enteral feeds for 2 days.  And it was restarted again 2 days ago following which later in the evening patient had some vomiting patient wife also noticed some fevers around around 99 degrees.  Patient also has very frequent urination.  Did not complain of any pain or any shortness of breath.  Patient also has been having dizziness when he stands up.  Has some cough nonproductive.  Given these changes patient was brought to the ER.  ED Course: In the ER patient was alert awake oriented to his name and place moving all extremities.  CT head was unremarkable.  Labs show WBC of 10.6 hemoglobin 12.9 calcium was 10.8 sodium 131 lactic acid 0.8 EKG normal sinus rhythm.  Covid test was negative.  UA was concerning for UTI and chest x-ray shows possible infiltrates.  Cultures were obtained and patient started on antibiotics and admitted for further observation.  **Interim History  Continued to have some nausea and vomiting. CT abdomen pelvis was unremarkable and KUB was unremarkable as well. Nausea and vomiting had improved but worsened again so GI was consulted for further evaluation In the interim we will obtain a calorie count for 48hours to see how the patient adequately takes his nutrition.  Medical oncology is consulted, and continue to hold tube feedings and diet as tolerated.  We will continue IV fluids for now.  Patient still continues to be orthostatic and repeat  orthostatic vital signs show that he is still orthostatic so will try an abdominal binder and also give the patient a 1 L fluid bolus in addition to continuing IV fluid hydration. [place] of his Significant Orthostatic Hypotension Cardiology was consulted. Cardiology witnessed the patient having orthostatic vital signs and they feel that he has a mixed picture of neurocardiogenic syncope as well as orthostatic hypotension they are recommending starting the patient on Northera 100 mg p.o. 3 times daily given his refractory orthostatic hypotension.  After much discussion with the clinical pharmacist cardiology was able to obtain Northera and will be available for [place] to give to the patient tomorrow  EGD done showed Esophagitis was ulcerative and circumferential and biopsies were taken with a cold forceps.  PEG tube looks good per GI and there is no obvious esophageal mass noted on the EGD this time.  GI recommended starting the patient on clear liquid diet and resuming tube feedings and starting PPI and Carafate slurry.  We will continue fluid hydration and will continue to monitor his orthostatics carefully.  GI recommending advancing diet as tolerated and is now on a good diet and will continue to advance to a soft diet.  We will follow up on his biopsy results.  Nausea vomiting is improving.  We will reinitiate tube feedings at a low rate and slowly advance.  They are recommending addition back and neck edema recommend if able to tolerate 24-hour feeds.  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 Hyponatremia and Hypercalcemia of Malignancy  -Chest x-ray findings  consistent with infiltrates.  Patient admitted with cough and confusion.   -Urine culture shows no growth.  Patient still confused  however oncology feels that he is at his baseline given that he does have dementia.   -On Rocephin and azithromycin.  Completed Abx now -Repeat CXR on 5/22 AM showed "Normal heart size. Convexity of the upper right mediastinum from adenopathy by PET CT. There is no edema, consolidation, effusion, or pneumothorax." -His donepezil and his memantine are being held -C/w IVF to address his Sodium and Treat Calcium   Nausea and Vomiting in the setting of Esophagitis, improving -Persistent but has now improved yesterday but worsened again so GI was consulted as below -C/w Antiemetics -Check CT Abd/Pelvis w/ Contrast and showed persistent distal esophageal mass consistent with known esophageal cancer and also did show progressive retroperitoneal lymphadenopathy with new enlarging lymph nodes; the previous celiac and gastrohepatic ligament adenopathy had now resolved and he does have some stable splenomegaly as well as a trace right pleural effusion.  Patient also did have some stable bilateral urinary tract calculi without obstruction -KUB was unremarkable -We will have GI formally consult since he continued to vomit today and yesterday and they are going to pursue endoscopy tomorrow to evaluate his esophagus and help with diet recommendations based on the radiation damage to his residual cancer -He underwent an EGD which showed esophagitis to 35 to 45 cm from the incisors and was ulcerative and circumferential; Bx are pending  -GI recommending clear liquid diet and advancing as tolerated as well as Carafate slurry and continue PPI.  Dr. Penelope [name] believes it is from the radiation and thinks that it will take some time to heal.  He recommends resuming tube feedings and will do so slowly but will have dietary adjust; dietitian has reinitiated tube feedings and Diet is advanced today   -Was getting [employer] BID but will advance to 30 mL/hr via PEG with goal per Dietary note   Orthostatic Hypotension, persistent -Patient is profoundly orthostatic still; BP went from 164/92 lying -> 131/79 sitting -> 88/57 Standing yesterday -Started normal saline + 40 mEQ of KCl; Has received multiple bolus of normal saline 1 Liter again (Got 1 yesterday); we will continue IV fluid hydration -Repeat orthostatic vital signs showed he was extremely orthostatic -Cardiology was consulted for further evaluation and they are recommending Northera given that he has a mixed neurocardiogenic picture -Continue with midodrine, Florinef (Increased dose by Cardiology), TED hose, and will order an abdominal binder.  Unfortunately we do not carry droxidopa and it was an issue to get inpatient however after much discussion with the cardiology PA and between the clinical pharmacist we will obtain it for tomorrow, outside pharmacy and it will cost the patient $81.25 at discharge  -We will need to monitor carefully and instead of Knee High TEDs we are ordering Thigh High -Appreciate cardiology evaluation recommendations and follow up response to Droxidopa   Hypercalcemia -Likely of Malignancy -Ca2+ is 11.3 but corrected for Albumin it is higher and is 12.1 -Check PTHrP, PTH, and Ionized Ca2+ -Medical Oncology giving the patient Zometa x1 -Continue to Monitor and Trend -Repeat CMP  in the AM  Status post PEG Tube -Hold feeds now that he continues to Vomit but the vomiting is likely from his esophagitis. -We will obtain a calorie count and have consulted nutrition for further evaluation; calorie count has been initiated yesterday and will be continued through today -Resume PEG feedings slowly and will consult Nutrition for re-evaluation and resumption   Depression  -On Wellbutrin and Lexapro  Normocytic Anemia -Hgb/Hct now is 12.1/37.1 -Check Anemia Panel and showed an iron level of 29, U IBC of 236,  TIBC 265, saturation ratios of 11%, ferritin of 225, folate level 5.9, vitamin B12 1478 -Start Iron supplementation with Niferex 150 mg po Daily  -Continue to Monitor for S/Sx of Bleeding; No Overt Bleeding noted -Repeat CBC in AM   Thrombocytopenia -Patient's Platelet Count is 154,000 -Continue to Monitor and Trend; Currently no overt bleeding noted -Repeat CBC in AM   Abnormal LFTs -AST went from 33 -> 42 -> 45 -> 34 -> 38 -> 31 -> 27 -ALT went from 29 -> 42 -> 52 -> 47 -> 49 -> 45 -> 40  -Continue to Monitor Carefully and if Necessary will obtain RUQ U/S and Acute Heptatitis Panel -Continue to Monitor and Trend Hepatic Fxn Panel -Repeat CMP in the AM   Hypokalemia -Mild at 3.5 and improved to 3.9 -Started Fluid with NS + 40 mEQ KCl at 75 mL/hr -Continue to Monitor and Replete as Necessary -Repeat CMP in QAM  Hypophosphatemia -Patient's Phos Level is now 2.4 -Replete with IV Sodium Phos 20 mmol -Continue to Monitor and Repelte as Necessary -Repeat Phos Level in the AM   Hyponatremia -Continue with IV fluid hydration with normal saline and added KCl; Na+ was 136 and dropped again and is 133 -Continue to monitor and replete as necessary -Repeat CMP in the a.m.  History of Dementia  -Worsening; Oncology feels he is at his baseline -He continues to complain of being "confused and groggy" -Place on Delirium Precautions -Has a 1:1 Safety Sitter  -Home meds are being held as above   History of Esophageal Cancer  -followed by Dr. Benay Spice and is being seen inpatient -Appreciate Dr. Gearldine Shown evaluation -EGD showed no esophageal mass noted  Goals of Care -Dr. Rodena Piety discussed with patient's wife, has living will at home.   -CODE STATUS changed to DNR which is appropriate for the patient.   -She also expressed a desire to speak with hospice.  Dr. Zigmund Daniel discussed with [facility] hosp[facility] who will contact her.  DVT prophylaxis: Apparent 40 mg subcu every 24 Code  Status: DO NOT RESUSCITATE  Family Communication: Called Wife and discussed the case with her extensively  Disposition Plan: Pending further improvement of his orthostatic hypotension and evaulation and management of his Nausea; Still profoundly orthostatic; Has a bed at SNF for [facility]   Status is: Inpatient  Remains inpatient appropriate because:Ongoing diagnostic testing needed not appropriate for outpatient work up and IV treatments appropriate due to intensity of illness or inability to take PO; Will get a Calorie Count  Dispo: The patient is from: Home              Anticipated d/c is to: SNF              Anticipated d/c date is: 2 days              Patient currently is not medically stable to d/c. given continued Orthostasis    Consultants:   Medical Oncology  Gastroenterology  Procedures:  EGD     Esophagitis was found 35 to 45 cm from the incisors. This was ulcerative       and circumferential. Biopsies were taken with a cold forceps for       histology. There was no obvious mass seen. No stricture.      No gross lesions were noted in the entire examined stomach. There is a       PEG TUBE which looks good.      The examined duodenum was normal. Impression:               - Esophagitis. Biopsied. NO OBVIOUS ESOPHAGEAL MASS                           - No gross lesions in the stomach.                           - Normal examined duodenum.   Antimicrobials:  Anti-infectives (From admission, onward)   Start     Dose/Rate Route Frequency Ordered Stop   08/27/19 0200  cefTRIAXone (ROCEPHIN) 2 g in sodium chloride 0.9 % 100 mL IVPB     2 g 200 mL/hr over 30 Minutes Intravenous Every 24 hours 08/26/19 0411 08/31/19 0233   08/26/19 0500  azithromycin (ZITHROMAX) 500 mg in sodium chloride 0.9 % 250 mL IVPB     500 mg 250 mL/hr over 60 Minutes Intravenous Every 24 hours 08/26/19 0411 08/30/19 0520   08/26/19 0115  cefTRIAXone (ROCEPHIN) 2 g in sodium chloride  0.9 % 100 mL IVPB     2 g 200 mL/hr over 30 Minutes Intravenous  Once 08/26/19 0106 08/26/19 0244     Subjective: Seen and examined at bedside and remains fatigued appearing.  A little confused today.  No nausea or vomiting noted.  About to start Northera for his orthostatic hypotension.  Getting Zometa for his hypercalcemia.  Updated wife and answered all her questions.  Objective: Vitals:   09/03/19 0500 09/03/19 1301 09/03/19 1311 09/03/19 1315  BP:  (!) 144/88 140/88 140/83  Pulse:  (!) 104 (!) 116 86  Resp:  18 18 16   Temp:  (!) 97.4 F (36.3 C)    TempSrc:  Oral    SpO2:  100% 100% 98%  Weight: 100.2 kg     Height:        Intake/Output Summary (Last 24 hours) at 09/03/2019 1644 Last data filed at 09/03/2019 1600 Gross per 24 hour  Intake 3181.2 ml  Output 1552 ml  Net 1629.2 ml   Filed Weights   09/01/19 0428 09/02/19 0356 09/03/19 0500  Weight: 101.2 kg 100.2 kg 100.2 kg   Examination: Physical Exam:  Constitutional: WN/WD chronically ill-appearing Caucasian male currently no acute distress appears calm but he does appear fatigued Eyes: Lids and conjunctivae normal, sclerae anicteric; has a cut above his left eye ENMT: External Ears, Nose appear normal. Grossly normal hearing.  Neck: Appears normal, supple, no cervical masses, normal ROM, no appreciable thyromegaly; no JVD Respiratory: Diminished to auscultation bilaterally, no wheezing, rales, rhonchi or crackles. Normal respiratory effort and patient is not tachypenic. No accessory muscle use.  Unlabored breathing Cardiovascular: RRR, no murmurs / rubs / gallops. S1 and S2 auscultated.  Abdomen: Soft, non-tender, distended secondary body habitus. Bowel sounds positive.  GU: Deferred. Musculoskeletal: No clubbing / cyanosis of digits/nails. No joint deformity upper and lower extremities.  Skin: No rashes, lesions, ulcers on limited skin evaluation. No induration; Warm and dry.  Neurologic: CN 2-12 grossly intact  with no focal deficits. Romberg sign and cerebellar reflexes not assessed.  Psychiatric: Normal judgment and insight. Alert and oriented x 3.  Fatigued and depressed appearing mood and appropriate affect.   Data Reviewed: I have personally reviewed following labs and imaging studies  CBC: Recent Labs  Lab 08/30/19 0540 08/31/19 0539 09/01/19 0543 09/02/19 0457 09/03/19 0553  WBC 6.8 9.3 6.8 6.6 7.5  NEUTROABS 5.4 7.7 5.3 5.2 6.0  HGB 11.8* 12.1* 11.7* 11.4* 12.1*  HCT 34.6* 36.6* 35.3* 34.7* 37.1*  MCV 85.6 87.1 88.7 87.2 88.1  PLT 159 160 141* 153 106   Basic Metabolic Panel: Recent Labs  Lab 08/30/19 0540 08/31/19 0539 09/01/19 0543 09/02/19 0457 09/03/19 0553  NA 131* 133* 136 133* 133*  K 3.3* 3.3* 3.2* 3.5 3.9  CL 103 104 102 101 103  CO2 22 22 25 23 23   GLUCOSE 109* 128* 104* 103* 118*  BUN 5* 6* 6* 7* 6*  CREATININE 0.84 0.96 0.81 0.99 0.86  CALCIUM 10.0 10.2 10.7* 10.5* 11.3*  MG 2.2 2.0 2.0 1.9 1.9  PHOS 2.1* 3.3 3.2 2.6 2.4*   GFR: Estimated Creatinine Clearance: 98.2 mL/min (by C-G formula based on SCr of 0.86 mg/dL). Liver Function Tests: Recent Labs  Lab 08/30/19 0540 08/31/19 0539 09/01/19 0543 09/02/19 0457 09/03/19 0553  AST 45* 34 38 31 27  ALT 52* 47* 49* 45* 40  ALKPHOS 51 51 54 53 54  BILITOT 1.0 0.8 0.8 1.2 0.8  PROT 5.6* 5.5* 5.5* 5.4* 5.6*  ALBUMIN 2.9* 2.9* 3.0* 3.0* 3.0*   No results for input(s): LIPASE, AMYLASE in the last 168 hours. No results for input(s): AMMONIA in the last 168 hours. Coagulation Profile: No results for input(s): INR, PROTIME in the last 168 hours. Cardiac Enzymes: No results for input(s): CKTOTAL, CKMB, CKMBINDEX, TROPONINI in the last 168 hours. BNP (last 3 results) No results for input(s): PROBNP in the last 8760 hours. HbA1C: No results for input(s): HGBA1C in the last 72 hours. CBG: Recent Labs  Lab 09/02/19 2337 09/03/19 0358 09/03/19 0723 09/03/19 1154 09/03/19 1554  GLUCAP 141* 129* 123*  163* 124*   Lipid Profile: No results for input(s): CHOL, HDL, LDLCALC, TRIG, CHOLHDL, LDLDIRECT in the last 72 hours. Thyroid Function Tests: Recent Labs    09/02/19 0457  TSH 1.948   Anemia Panel: No results for input(s): VITAMINB12, FOLATE, FERRITIN, TIBC, IRON, RETICCTPCT in the last 72 hours. Sepsis Labs: No results for input(s): PROCALCITON, LATICACIDVEN in the last 168 hours.  Recent Results (from the past 240 hour(s))  Blood culture (routine x 2)     Status: None   Collection Time: 08/25/19 11:00 PM   Specimen: BLOOD  Result Value Ref Range Status   Specimen Description   Final    BLOOD LEFT ANTECUBITAL Performed at [facility] [address], [place], [place] [address]    Special Requests   Final    BOTTLES DRAWN AEROBIC AND ANAEROBIC Blood Culture adequate volume Performed at [facility] [address], [place], [place] [address]    Culture   Final    NO GROWTH 5 DAYS Performed at [facility], [place] [address], [place],  [address]    Report Status 08/31/2019 FINAL  Final  Blood culture (routine x 2)     Status: None   Collection Time: 08/25/19  11:04 PM   Specimen: BLOOD  Result Value Ref Range Status   Specimen Description   Final    BLOOD LEFT HAND Performed at [facility] [address], [place], [place] [address]    Special Requests   Final    BOTTLES DRAWN AEROBIC AND ANAEROBIC Blood Culture adequate volume Performed at [facility] [address] [place], [place] [address]    Culture   Final    NO GROWTH 5 DAYS Performed at [facility], [place] [address] [place], [place] [address]    Report Status 08/31/2019 FINAL  Final  Urine Culture     Status: Abnormal   Collection Time: 08/26/19 12:58 AM   Specimen: Urine, Random  Result Value Ref Range Status   Specimen Description   Final    URINE, RANDOM Performed at [facility]  [address] [place], [place] [address]    Special Requests   Final    NONE Performed at [facility], [place] [address], [place], [place] [address]    Culture (A)  Final    <10,000 COLONIES/mL INSIGNIFICANT GROWTH Performed at [facility] [address], [place], [place] [address]    Report Status 08/27/2019 FINAL  Final  SARS Coronavirus 2 by RT PCR (hospital order, performed in [facility] hospital lab) Nasopharyngeal Nasopharyngeal Swab     Status: None   Collection Time: 08/26/19  1:06 AM   Specimen: Nasopharyngeal Swab  Result Value Ref Range Status   SARS Coronavirus 2 NEGATIVE NEGATIVE Final    Comment: (NOTE) SARS-CoV-2 target nucleic acids are NOT DETECTED. The SARS-CoV-2 RNA is generally detectable in upper and lower respiratory specimens during the acute phase of infection. The lowest concentration of SARS-CoV-2 viral copies this assay can detect is 250 copies / mL. A negative result does not preclude SARS-CoV-2 infection and should not be used as the sole basis for treatment or other patient management decisions.  A negative result may occur with improper specimen collection / handling, submission of specimen other than nasopharyngeal swab, presence of viral mutation(s) within the areas targeted by this assay, and inadequate number of viral copies (<250 copies / mL). A negative result must be combined with clinical observations, patient history, and epidemiological information. Fact Sheet for Patients:   StrictlyIdeas.no Fact Sheet for Healthcare Providers: BankingDealers.co.za This test is not yet approved or cleared  by the [place] FDA and has been authorized for detection and/or diagnosis of SARS-CoV-2 by FDA under an Emergency Use Authorization (EUA).  This EUA will remain in effect (meaning this test can be used) for the duration of the COVID-19 declaration under Section 564(b)(1) of the  Act, 21 U.S.C. section 360bbb-3(b)(1), unless the authorization is terminated or revoked sooner. Performed at [facility], [place] [address], [place],  [address]     RN Pressure Injury Documentation:     Estimated body mass index is 28.36 kg/m as calculated from the following:   Height as of this encounter: 6\' 2"  (1.88 m).   Weight as of this encounter: 100.2 kg.  Malnutrition Type:  Nutrition Problem: Increased nutrient needs Etiology: cancer and cancer related treatments   Malnutrition Characteristics:  Signs/Symptoms: estimated needs   Nutrition Interventions:  Interventions: [place])   Radiology Studies: No results found. Scheduled Meds: . buPROPion  150 mg Oral Daily  . Droxidopa  100 mg Oral TID  . enoxaparin (LOVENOX) injection  40 mg  Subcutaneous Q24H  . escitalopram  20 mg Oral Daily  . feeding supplement ([employer] STANDARD 1.4)  650 mL Per Tube Q24H  . fludrocortisone  0.2 mg Oral Daily  . iron polysaccharides  150 mg Oral Daily  . midodrine  10 mg Oral TID WC  . pantoprazole sodium  40 mg Oral BID  . saccharomyces boulardii  250 mg Oral BID  . sucralfate  1 g Oral TID WC & HS  . tamsulosin  0.4 mg Oral QHS  . vitamin B-12  1,000 mcg Oral Daily   Continuous Infusions: . 0.9 % NaCl with KCl 40 mEq / L 75 mL/hr at 09/03/19 1600    LOS: 8 days   Kerney Elbe, DO [facility] PAGER is on [place]  If 7PM-7AM, please contact night-coverage [email or web address]

## 2019-09-03 NOTE — Progress Notes (Signed)
Patient's wife [name] calls with concerns that since tube feedings were restarted he now has diarrhea, she has questions regarding tube feedings and digestive enzymes.  I messaged the dietician on his floor and she will follow up.  Also he is refusing clear liquids.    I consulted with Dr. Benay Spice and he is going to advance his diet to as tolerated since recent procedure shows no mass in esophagus.    I called his wife to let her know above - had to leave a voice message.  Also let her know that if he is still in the hospital we will need to rescheduled the [facility] that is scheduled for 5/27.   I asked her to call me back if she has questions.

## 2019-09-03 NOTE — Care Management Important Message (Signed)
Important Message  Patient Details IM Letter given to [facility] SW Case Management to present to the Patient Name: David [name] MRN: [id number] Date of Birth: 09-Jan-1946   Medicare Important Message Given:  Yes     Kerin Salen 09/03/2019, 10:32 AM

## 2019-09-03 NOTE — Progress Notes (Addendum)
HEMATOLOGY-ONCOLOGY PROGRESS NOTE  SUBJECTIVE: The patient has no specific complaints this morning.  He denies abdominal pain, nausea, vomiting, diarrhea.  Sitter is at the bedside.  Continues on tube feeding.  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     PHYSICAL EXAMINATION:  Vitals:   09/02/19 2007 09/03/19 0357  BP: (!) 156/89 (!) 153/76  Pulse: 76 85  Resp: 19 18  Temp: 97.6 F (36.4 C) 97.7 F (36.5 C)  SpO2: 99% 93%   Filed Weights   09/01/19 0428 09/02/19 0356 09/03/19 0500  Weight: 101.2 kg 100.2 kg 100.2 kg    Intake/Output from previous day: 05/24 0701 - 05/25 0700 In: 2088.7 [P.O.:300; I.V.:1388.7; IV Piggyback:400] Out: 1610 [Urine:1550; Emesis/NG output:1]  ABDOMEN:  soft, nontender, left upper abdomen feeding tube in place  NEURO: alert and follows commands  LABORATORY DATA:  I have reviewed the data as listed CMP Latest Ref Rng & Units 09/03/2019 09/02/2019 09/01/2019  Glucose 70 - 99 mg/dL 118(H) 103(H) 104(H)  BUN 8 - 23 mg/dL 6(L) 7(L) 6(L)  Creatinine 0.61 - 1.24 mg/dL 0.86 0.99 0.81  Sodium 135 - 145 mmol/L 133(L) 133(L) 136  Potassium 3.5 - 5.1 mmol/L 3.9 3.5 3.2(L)  Chloride 98 - 111 mmol/L 103 101 102  CO2 22 - 32 mmol/L _0 Calcium 8.9 - 10.3 mg/dL 11.3(H) 10.5(H) 10.7(H)  Total Protein 6.5 - 8.1 g/dL 5.6(L) 5.4(L) 5.5(L)  Total Bilirubin 0.3 - 1.2 mg/dL 0.8 1.2 0.8  Alkaline Phos 38 - 126 U/L 54 53 54  AST 15 - 41 U/L 27 31 38  ALT 0 - 44 U/L 40 45(H) 49(H)    Lab Results  Component Value Date   WBC 7.5 09/03/2019   HGB 12.1 (L) 09/03/2019   HCT 37.1 (L) 09/03/2019   MCV 88.1 09/03/2019   PLT 154  09/03/2019   NEUTROABS 6.0 09/03/2019    DG Chest 2 View  Result Date: 08/25/2019 CLINICAL DATA:  Fever. EXAM: CHEST - 2 VIEW COMPARISON:  Chest radiograph 05/06/2019. PET CT 07/05/2019 FINDINGS: Upper normal heart size. Aortic atherosclerosis. Right paratracheal soft tissue density corresponds to adenopathy on PET. Streaky right infrahilar opacities. No focal airspace disease, pleural effusion, or pneumothorax. No acute osseous abnormalities are seen. IMPRESSION: 1. Streaky right infrahilar opacities may be atelectasis or potentially aspiration. 2. Right paratracheal adenopathy. Electronically Signed   By: Keith Rake M.D.   On: 08/25/2019 23:19   DG Abd 1 View  Result Date: 08/29/2019 CLINICAL DATA:  Intractable nausea and vomiting. History of esophageal cancer. EXAM: ABDOMEN - 1 VIEW COMPARISON:  08/05/2019 abdominal film and CT scan 08/28/2019 FINDINGS: There is scattered contrast in the right colon and rectum. No findings for obstruction or perforation. IMPRESSION: No findings for bowel obstruction. Electronically Signed   By: Marijo Sanes M.D.   On: 08/29/2019 08:05   CT HEAD WO CONTRAST  Result Date: 08/26/2019 CLINICAL DATA:  Facial trauma, fall EXAM: CT HEAD WITHOUT CONTRAST TECHNIQUE: Contiguous axial images were obtained from the base of the skull through the vertex without intravenous contrast. COMPARISON:  08/25/2019 FINDINGS: Brain: There is atrophy and chronic small vessel disease  changes. No acute intracranial abnormality. Specifically, no hemorrhage, hydrocephalus, mass lesion, acute infarction, or significant intracranial injury. Vascular: No hyperdense vessel or unexpected calcification. Skull: No acute calvarial abnormality. Sinuses/Orbits: Visualized paranasal sinuses and mastoids clear. Orbital soft tissues unremarkable. Other: None IMPRESSION: Atrophy, chronic microvascular disease. No acute intracranial abnormality. Electronically Signed   By: Rolm Baptise M.D.   On:  08/26/2019 18:31   CT Head Wo Contrast  Result Date: 08/25/2019 CLINICAL DATA:  Encephalopathy, nausea, and dizziness EXAM: CT HEAD WITHOUT CONTRAST TECHNIQUE: Contiguous axial images were obtained from the base of the skull through the vertex without intravenous contrast. COMPARISON:  CT 08/06/2019 FINDINGS: Brain: No evidence of acute infarction, hemorrhage, hydrocephalus, extra-axial collection or mass lesion/mass effect. Symmetric prominence of the ventricles, cisterns and sulci compatible with parenchymal volume loss. Confluent and more patchy areas of white matter hypoattenuation are most compatible with chronic microvascular angiopathy. Vascular: Atherosclerotic calcification of the carotid siphons and intradural vertebral arteries. No hyperdense vessel. Skull: No calvarial fracture or suspicious osseous lesion. No scalp swelling or hematoma. Sinuses/Orbits: Paranasal sinuses and mastoid air cells are predominantly clear. Pneumatization of the petrous apices. Orbital structures are unremarkable aside from prior lens extractions. Other: None IMPRESSION: 1. No acute intracranial abnormality. 2. Chronic stable parenchymal volume loss and chronic microvascular angiopathy. Electronically Signed   By: Lovena Le M.D.   On: 08/25/2019 23:32   CT HEAD WO CONTRAST  Result Date: 08/06/2019 CLINICAL DATA:  Recurrent syncope EXAM: CT HEAD WITHOUT CONTRAST TECHNIQUE: Contiguous axial images were obtained from the base of the skull through the vertex without intravenous contrast. COMPARISON:  05/06/2019 FINDINGS: Brain: There is no mass, hemorrhage or extra-axial collection. The appearance of the white matter is normal for the patient's age. There is generalized atrophy. Vascular: No abnormal hyperdensity of the major intracranial arteries or dural venous sinuses. No intracranial atherosclerosis. Skull: The visualized skull base, calvarium and extracranial soft tissues are normal. Sinuses/Orbits: No fluid levels  or advanced mucosal thickening of the visualized paranasal sinuses. No mastoid or middle ear effusion. The orbits are normal. IMPRESSION: Generalized atrophy without acute intracranial abnormality. Electronically Signed   By: Ulyses Jarred M.D.   On: 08/06/2019 21:42   MR BRAIN WO CONTRAST  Result Date: 08/09/2019 CLINICAL DATA:  Dizziness, dehydration or hypotension. Additional history provided: Medical history significant for esophageal cancer on palliative chemotherapy, hypertension, dementia, hyperlipidemia, BPH, anxiety disorder presenting with intermittent dizziness. EXAM: MRI HEAD WITHOUT CONTRAST TECHNIQUE: Multiplanar, multiecho pulse sequences of the brain and surrounding structures were obtained without intravenous contrast. COMPARISON:  Noncontrast head CT 08/06/2019, brain MRI 10/28/2017. FINDINGS: Brain: Please note evaluation for intracranial metastatic disease is limited on this noncontrast study. There are few scattered punctate foci of T2/FLAIR hyperintensity within the cerebral white matter, unchanged and nonspecific. Stable moderate correct atrophy with somewhat disproportionate involvement of the medial temporal lobes. There is no acute infarct. No evidence of intracranial mass. No chronic intracranial blood products. No extra-axial fluid collection. No midline shift. Vascular: Expected proximal arterial flow voids. Skull and upper cervical spine: No focal marrow lesion. Sinuses/Orbits: Visualized orbits show no acute finding. Minimal ethmoid sinus mucosal thickening. No significant mastoid effusion. IMPRESSION: 1. Please note evaluation for intracranial metastatic disease is limited on this non-contrast exam. 2. No evidence of acute intracranial abnormality. 3. Stable moderate parenchymal atrophy with somewhat disproportionate involvement of the medial temporal lobes. 4. A few scattered punctate T2 hyperintense signal changes within the cerebral white matter are unchanged and nonspecific.  5.  Minimal ethmoid sinus mucosal thickening. Electronically Signed   By: Kellie Simmering DO   On: 08/09/2019 15:38   CT ABDOMEN PELVIS W CONTRAST  Result Date: 08/28/2019 CLINICAL DATA:  Nausea and vomiting, history of esophageal cancer EXAM: CT ABDOMEN AND PELVIS WITH CONTRAST TECHNIQUE: Multidetector CT imaging of the abdomen and pelvis was performed using the standard protocol following bolus administration of intravenous contrast. CONTRAST:  140m OMNIPAQUE IOHEXOL 300 MG/ML  SOLN COMPARISON:  05/06/2019, 07/05/2019 FINDINGS: Lower chest: Circumferential wall thickening of the distal esophagus again noted, consistent with known esophageal cancer. Overall, significant improvement since prior PET scan. No acute airspace disease.  Trace right pleural effusion. Hepatobiliary: Minimal gallbladder sludge. No calcified gallstones or acute cholecystitis. Liver is unremarkable. Pancreas: Unremarkable. No pancreatic ductal dilatation or surrounding inflammatory changes. Spleen: Spleen is enlarged measuring 15.8 cm in craniocaudal length, grossly stable. Adrenals/Urinary Tract: There is a nonobstructing 8 mm distal left ureteral calculus. Punctate 3 mm nonobstructing right renal calculus is noted. No obstructive uropathy within either kidney. The kidneys enhance normally and symmetrically. The adrenals are unremarkable. Bladder is moderately distended without focal abnormality. Stomach/Bowel: No bowel obstruction or ileus. Normal appendix right lower quadrant. Percutaneous gastrostomy tube is seen within the gastric lumen. Vascular/Lymphatic: There is progressive retroperitoneal adenopathy. Left para-aortic adenopathy measures up to 16 mm in short axis reference image 31. The lymphadenopathy at the celiac axis and within the gastrohepatic ligament on prior PET scan are no longer identified. Minimal atherosclerosis of the aorta unchanged. Reproductive: Prostate is mildly enlarged but stable. Other: No abdominal wall hernia  or abnormality. No abdominopelvic ascites. Musculoskeletal: No acute or destructive bony lesions. Reconstructed images demonstrate no additional findings. IMPRESSION: 1. Persistent distal esophageal mass consistent with known esophageal cancer. Overall improvement since recent PET scan. 2. Progressive retroperitoneal lymphadenopathy, with new and enlarging lymph nodes as above. The previously seen celiac and gastrohepatic ligament adenopathy has resolved. 3. Stable splenomegaly. 4. Trace right pleural effusion. 5. Stable bilateral urinary tract calculi without obstruction. Electronically Signed   By: MRanda NgoM.D.   On: 08/28/2019 21:15   DG ABDOMEN PEG TUBE LOCATION  Result Date: 08/05/2019 CLINICAL DATA:  Peg tube evaluation. EXAM: ABDOMEN - 1 VIEW COMPARISON:  IR gastrostomy placement 07/08/2019 FINDINGS: A single frontal view of the abdomen is provided. A PEG tube balloon is seen in the left upper quadrant projecting over the stomach. There is contrast within the stomach and proximal duodenum. No evidence of extraluminal contrast. The bowel gas pattern is nonobstructive. IMPRESSION: PEG tube balloon projects over the stomach. Contrast is seen within the stomach and proximal duodenum. No evidence of extraluminal contrast. Electronically Signed   By: NAudie PintoM.D.   On: 08/05/2019 18:06   DG CHEST PORT 1 VIEW  Result Date: 08/31/2019 CLINICAL DATA:  Shortness of breath EXAM: PORTABLE CHEST 1 VIEW COMPARISON:  08/25/2019 FINDINGS: Normal heart size. Convexity of the upper right mediastinum from adenopathy by PET CT. There is no edema, consolidation, effusion, or pneumothorax. IMPRESSION: 1. No acute finding. 2. Known thoracic adenopathy. Electronically Signed   By: JMonte FantasiaM.D.   On: 08/31/2019 07:34    ASSESSMENT AND PLAN: 1. Esophagus cancer ? CT 05/06/2019-circumferential thickening of the distal esophagus, mild splenomegaly, bladder wall thickening, enlarged prostate ? Upper  endoscopy 06/11/2019-ulcerated mass at the lower esophagus, 36 cm from the incisors, partially obstructing, biopsy revealed atypical cells-reactive change versus poorly differentiated neoplasm[facility]  06/21/2019 confirmed a poorly differentiated malignant neoplasm ? HER-2 negative, PD-L1 combined positive score -20 ? Repeat endoscopy 06/21/2019-partially obstructing esophageal tumor found in the lower third of the esophagus ? PET scan 07/05/2019-large esophageal mass with nodal involvement both above and below the diaphragm including a left periaortic node ? Palliative radiation to the esophagus mass starting 07/22/2019, completed 08/09/2019 ? Cycle 1 pembrolizumab 08/15/2019 ? Upper endoscopy 08/31/2019-no esophageal mass, no stricture, biopsy with inflammation, indefinite for dysplasia 2. Dementia 3. Anxiety/depression 4. Sleep apnea 5. BPH 6. Dysphagia secondary to #1  Gastrostomy tube placement 07/08/2019 7. Hyperlipidemia 8. Admission with hematemesis 07/21/2019 9. Admission for dehydration and pre-syncope in clinic 08/06/19 10. Recurrent nausea and vomiting-etiology unclear   David [name] appears stable.  He underwent upper endoscopy on 08/31/2019 which showed esophagitis but no obvious mass.  He is not currently reporting any nausea, vomiting, or diarrhea.  His tube feedings have been resumed.  Awaiting placement at SNF.  Recommendations: 1.  Hold tube feedings and advance diet as tolerated. 2.  He is currently scheduled for cycle 2 of pembrolizumab this Thursday.  We will plan to administer this at the cancer center if he is discharged.  However, we will need to delay this if he remains inpatient. 3.  Await placement at SNF.   LOS: 8 days   Mikey Bussing 09/03/19   David [name] was interviewed and examined.  He appears unchanged.  The upper endoscopy did not reveal a remaining mass or stricture.  I recommend advancing his diet  as tolerated.  The tube feedings are likely causing intermittent nausea and diarrhea.  We plan to administer cycle 2 pembrolizumab as an outpatient.

## 2019-09-04 ENCOUNTER — Telehealth: Payer: Self-pay

## 2019-09-04 LAB — CBC WITH DIFFERENTIAL/PLATELET
Abs Immature Granulocytes: 0.06 10*3/uL (ref 0.00–0.07)
Basophils Absolute: 0 10*3/uL (ref 0.0–0.1)
Basophils Relative: 0 %
Eosinophils Absolute: 0.1 10*3/uL (ref 0.0–0.5)
Eosinophils Relative: 1 %
HCT: 36.4 % — ABNORMAL LOW (ref 39.0–52.0)
Hemoglobin: 12.1 g/dL — ABNORMAL LOW (ref 13.0–17.0)
Immature Granulocytes: 1 %
Lymphocytes Relative: 11 %
Lymphs Abs: 0.9 10*3/uL (ref 0.7–4.0)
MCH: 29.3 pg (ref 26.0–34.0)
MCHC: 33.2 g/dL (ref 30.0–36.0)
MCV: 88.1 fL (ref 80.0–100.0)
Monocytes Absolute: 0.5 10*3/uL (ref 0.1–1.0)
Monocytes Relative: 7 %
Neutro Abs: 6.4 10*3/uL (ref 1.7–7.7)
Neutrophils Relative %: 80 %
Platelets: 151 10*3/uL (ref 150–400)
RBC: 4.13 MIL/uL — ABNORMAL LOW (ref 4.22–5.81)
RDW: 14.1 % (ref 11.5–15.5)
WBC: 8 10*3/uL (ref 4.0–10.5)
nRBC: 0 % (ref 0.0–0.2)

## 2019-09-04 LAB — COMPREHENSIVE METABOLIC PANEL
ALT: 37 U/L (ref 0–44)
AST: 27 U/L (ref 15–41)
Albumin: 3.1 g/dL — ABNORMAL LOW (ref 3.5–5.0)
Alkaline Phosphatase: 55 U/L (ref 38–126)
Anion gap: 7 (ref 5–15)
BUN: 9 mg/dL (ref 8–23)
CO2: 24 mmol/L (ref 22–32)
Calcium: 10.9 mg/dL — ABNORMAL HIGH (ref 8.9–10.3)
Chloride: 102 mmol/L (ref 98–111)
Creatinine, Ser: 0.89 mg/dL (ref 0.61–1.24)
GFR calc Af Amer: >60 mL/min (ref >60)
GFR calc non Af Amer: >60 mL/min (ref >60)
Glucose, Bld: 128 mg/dL — ABNORMAL HIGH (ref 70–99)
Potassium: 3.4 mmol/L — ABNORMAL LOW (ref 3.5–5.1)
Sodium: 133 mmol/L — ABNORMAL LOW (ref 135–145)
Total Bilirubin: 0.7 mg/dL (ref 0.3–1.2)
Total Protein: 5.6 g/dL — ABNORMAL LOW (ref 6.5–8.1)

## 2019-09-04 LAB — PHOSPHORUS: Phosphorus: 2.8 mg/dL (ref 2.5–4.6)

## 2019-09-04 LAB — GLUCOSE, CAPILLARY
Glucose-Capillary: 108 mg/dL — ABNORMAL HIGH (ref 70–99)
Glucose-Capillary: 120 mg/dL — ABNORMAL HIGH (ref 70–99)
Glucose-Capillary: 122 mg/dL — ABNORMAL HIGH (ref 70–99)
Glucose-Capillary: 124 mg/dL — ABNORMAL HIGH (ref 70–99)
Glucose-Capillary: 126 mg/dL — ABNORMAL HIGH (ref 70–99)
Glucose-Capillary: 93 mg/dL (ref 70–99)

## 2019-09-04 LAB — MAGNESIUM: Magnesium: 2 mg/dL (ref 1.7–2.4)

## 2019-09-04 MED ORDER — KATE FARMS STANDARD 1.4 PO LIQD
975.0000 mL | ORAL | Status: DC
  Administered 2019-09-04: 975 mL
  Filled 2019-09-04 (×2): qty 975

## 2019-09-04 NOTE — Progress Notes (Signed)
PROGRESS NOTE    David [name]  WUJ:[id number] DOB: April 03, 1946 DOA: 08/25/2019 PCP: Leighton Ruff, MD   Brief Narrative: David [name] is a 74 y.o. malewithhistory of esophageal cancer PEG tube placement, BPH, dementia, depression. Patient presented secondary to worsening confusion, nausea and vomiting and found to have evidence of pneumonia in addition to hypercalcemia.   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 Mental status appears to have improved and at baseline.  Pneumonia Chest x-ray concerning for infiltrates. Patient treated with Ceftriaxone and azithromycin. Completed course.  Hyponatremia Mild. Stable.  Hypercalcemia Given Zometa on 5/25. Calcium still high. Oncology on board.  Nausea/vomiting PEG tube in place. EGD significant for esophagitis. Improved. -Soft diet  Orthostatic hypotension Cardiology consulted. Patient is on Florinef, midodrine and Droxidopa  Depression -Continue Wellbutrin and Lexapro  Thrombocytopenia Stable.  Esophageal cancer Patient is followed by medical oncology. No mass seen on recent EGD. Patient is s/p radiation. PEG tube placed secondary to issues with oral intake secondary to mass.  Urinary frequency BPH Patient is on Tamsulosin  Dementia Patient likely baseline per chart review. Patient states he feels confused.  Nephrolithiasis Non-obstructing.  Goals of care Patient is now DNR. Hospice referral was made for patient for possible home hospice.   DVT prophylaxis: Lovenox Code Status:   Code Status: DNR Family Communication: None at bedside. Wife on  telephone Disposition Plan: Discharge pending improvement of symptomatic orthostatic hypotension as he is unsafe to ambulate; high fall risk   Consultants:   Medical oncology  Procedures:   EGD (08/31/2019) Impression:               - Esophagitis. Biopsied. NO OBVIOUS ESOPHAGEAL MASS                           - No gross lesions in the stomach.                           - Normal examined duodenum.  Recommendation:           - Advance diet as tolerated.                           - Continue present medications.  Antimicrobials:  Ceftriaxone  Azithromycin    Subjective: Patient states he is feeling confused. No other concerns.  Objective: Vitals:   09/03/19 2041 09/04/19 0358 09/04/19 0500 09/04/19 1500  BP: 136/84 (!) 158/85  (!) 168/87  Pulse: 82 93  74  Resp: 18 17  18   Temp: 98.3 F (36.8 C) 98 F (36.7 C)  98.3 F (36.8 C)  TempSrc: Oral Oral  Oral  SpO2: 100% 98%  98%  Weight:   98 kg   Height:        Intake/Output Summary (Last 24 hours) at 09/04/2019 1817 Last data filed at 09/04/2019 1500 Gross per 24 hour  Intake 2231.89 ml  Output 2275 ml  Net -43.11 ml   Filed Weights   09/02/19 0356 09/03/19 0500 09/04/19 0500  Weight: 100.2 kg 100.2 kg 98 kg  Examination:  General exam: Appears calm and comfortable Respiratory system: Clear to auscultation. Respiratory effort normal. Cardiovascular system: S1 & S2 heard, RRR. No murmurs, rubs, gallops or clicks. Gastrointestinal system: Abdomen is nondistended, soft and nontender. No organomegaly or masses felt. Normal bowel sounds heard. Central nervous system: Alert and oriented. No focal neurological deficits. Musculoskeletal: No edema. No calf tenderness Skin: No cyanosis. No rashes Psychiatry: Judgement and insight appear normal. Mood & affect appropriate.     Data Reviewed: I have personally reviewed following labs and imaging studies  CBC    Component Value Date/Time   WBC 8.0 09/04/2019 0519    RBC 4.13 (L) 09/04/2019 0519   HGB 12.1 (L) 09/04/2019 0519   HGB 14.2 08/22/2019 1350   HCT 36.4 (L) 09/04/2019 0519   PLT 151 09/04/2019 0519   PLT 132 (L) 08/22/2019 1350   MCV 88.1 09/04/2019 0519   MCH 29.3 09/04/2019 0519   MCHC 33.2 09/04/2019 0519   RDW 14.1 09/04/2019 0519   LYMPHSABS 0.9 09/04/2019 0519   MONOABS 0.5 09/04/2019 0519   EOSABS 0.1 09/04/2019 0519   BASOSABS 0.0 09/04/2019 0519     CMP     Component Value Date/Time   NA 133 (L) 09/04/2019 0519   K 3.4 (L) 09/04/2019 0519   CL 102 09/04/2019 0519   CO2 24 09/04/2019 0519   GLUCOSE 128 (H) 09/04/2019 0519   BUN 9 09/04/2019 0519   CREATININE 0.89 09/04/2019 0519   CREATININE 1.25 (H) 08/22/2019 1350   CALCIUM 10.9 (H) 09/04/2019 0519   PROT 5.6 (L) 09/04/2019 0519   ALBUMIN 3.1 (L) 09/04/2019 0519   AST 27 09/04/2019 0519   AST 20 08/22/2019 1350   ALT 37 09/04/2019 0519   ALT 17 08/22/2019 1350   ALKPHOS 55 09/04/2019 0519   BILITOT 0.7 09/04/2019 0519   BILITOT 1.0 08/22/2019 1350   GFRNONAA >60 09/04/2019 0519   GFRNONAA 57 (L) 08/22/2019 1350   GFRAA >60 09/04/2019 0519   GFRAA >60 08/22/2019 1350    CBG (last 3)  Recent Labs    09/04/19 0741 09/04/19 1215 09/04/19 1656  GLUCAP 122* 120* 108*     GFR: Estimated Creatinine Clearance: 87.2 mL/min (by C-G formula based on SCr of 0.89 mg/dL).  Estimated Creatinine Clearance: 87.2 mL/min (by C-G formula based on SCr of 0.89 mg/dL).  Liver Function Tests: Recent Labs  Lab 08/31/19 0539 09/01/19 0543 09/02/19 0457 09/03/19 0553 09/04/19 0519  AST 34 38 31 27 27   ALT 47* 49* 45* 40 37  ALKPHOS 51 54 53 54 55  BILITOT 0.8 0.8 1.2 0.8 0.7  PROT 5.5* 5.5* 5.4* 5.6* 5.6*  ALBUMIN 2.9* 3.0* 3.0* 3.0* 3.1*   No results for input(s): LIPASE, AMYLASE in the last 168 hours. No results for input(s): AMMONIA in the last 168 hours.  Coagulation Profile: No results for input(s): INR, PROTIME in the last 168 hours.  Recent Results  (from the past 240 hour(s))  Blood culture (routine x 2)     Status: None   Collection Time: 08/25/19 11:00 PM   Specimen: BLOOD  Result Value Ref Range Status   Specimen Description   Final    BLOOD LEFT ANTECUBITAL Performed at [facility] [address], [place], [place] [address]    Special Requests   Final    BOTTLES DRAWN AEROBIC AND ANAEROBIC Blood Culture adequate volume Performed at [facility] [address] [place], [place] [address]  Culture   Final    NO GROWTH 5 DAYS Performed at [facility], [place] [address], [place], [place] [address]    Report Status 08/31/2019 FINAL  Final  Blood culture (routine x 2)     Status: None   Collection Time: 08/25/19 11:04 PM   Specimen: BLOOD  Result Value Ref Range Status   Specimen Description   Final    BLOOD LEFT HAND Performed at [facility] [address], [place], [place] [address]    Special Requests   Final    BOTTLES DRAWN AEROBIC AND ANAEROBIC Blood Culture adequate volume Performed at [facility] [address], [place], [place] [address]    Culture   Final    NO GROWTH 5 DAYS Performed at [facility], [place] [address] [place], [place] [address]    Report Status 08/31/2019 FINAL  Final  Urine Culture     Status: Abnormal   Collection Time: 08/26/19 12:58 AM   Specimen: Urine, Random  Result Value Ref Range Status   Specimen Description   Final    URINE, RANDOM Performed at [facility] [address] [place], [place] [address]    Special Requests   Final    NONE Performed at [facility], [place] [address], [place], [place] [address]    Culture (A)  Final    <10,000 COLONIES/mL INSIGNIFICANT GROWTH Performed at [facility] [address] [place],  [address]    Report Status 08/27/2019 FINAL  Final  SARS Coronavirus 2 by RT PCR (hospital order, performed in  [facility] hospital lab) Nasopharyngeal Nasopharyngeal Swab     Status: None   Collection Time: 08/26/19  1:06 AM   Specimen: Nasopharyngeal Swab  Result Value Ref Range Status   SARS Coronavirus 2 NEGATIVE NEGATIVE Final    Comment: (NOTE) SARS-CoV-2 target nucleic acids are NOT DETECTED. The SARS-CoV-2 RNA is generally detectable in upper and lower respiratory specimens during the acute phase of infection. The lowest concentration of SARS-CoV-2 viral copies this assay can detect is 250 copies / mL. A negative result does not preclude SARS-CoV-2 infection and should not be used as the sole basis for treatment or other patient management decisions.  A negative result may occur with improper specimen collection / handling, submission of specimen other than nasopharyngeal swab, presence of viral mutation(s) within the areas targeted by this assay, and inadequate number of viral copies (<250 copies / mL). A negative result must be combined with clinical observations, patient history, and epidemiological information. Fact Sheet for Patients:   StrictlyIdeas.no Fact Sheet for Healthcare Providers: BankingDealers.co.za This test is not yet approved or cleared  by the [place] FDA and has been authorized for detection and/or diagnosis of SARS-CoV-2 by FDA under an Emergency Use Authorization (EUA).  This EUA will remain in effect (meaning this test can be used) for the duration of the COVID-19 declaration under Section 564(b)(1) of the Act, 21 U.S.C. section 360bbb-3(b)(1), unless the authorization is terminated or revoked sooner. Performed at [facility], [place] [address], [place],  [address]         Radiology Studies: No results found.      Scheduled Meds: . buPROPion  150 mg Oral Daily  . Droxidopa  100 mg Oral TID  . enoxaparin (LOVENOX) injection  40 mg Subcutaneous Q24H  . escitalopram  20 mg  Oral Daily  . feeding supplement (  KATE FARMS STANDARD 1.4)  975 mL Per Tube Q24H  . fludrocortisone  0.2 mg Oral Daily  . iron polysaccharides  150 mg Oral Daily  . midodrine  10 mg Oral TID WC  . pantoprazole sodium  40 mg Oral BID  . saccharomyces boulardii  250 mg Oral BID  . sucralfate  1 g Oral TID WC & HS  . tamsulosin  0.4 mg Oral QHS  . vitamin B-12  1,000 mcg Oral Daily   Continuous Infusions: . 0.9 % NaCl with KCl 40 mEq / L 75 mL/hr (09/04/19 1112)     LOS: 9 days     Cordelia Poche, MD [facility] 09/04/2019, 6:17 PM  If 7PM-7AM, please contact night-coverage [email or web address]

## 2019-09-04 NOTE — Telephone Encounter (Signed)
Per request of the palliative care NP, SW completed a follow-up call to patient's wife-David [name] to extend support and counseling to her. SW provided supportive and reflective listening, normalization of feelings,  reassurance of support and supportive counseling. Patient is currently hospitalized. [name] is concern about their daughter and desires counseling for her. SW will seek counseling resources for their daughter who has autism. SW to follow-up.

## 2019-09-04 NOTE — Progress Notes (Signed)
  Radiation Oncology         [phone] ________________________________  Name: David [name] MRN: [id number]  Date: 08/09/2019  DOB: 1945/10/24  End of Treatment Note  Diagnosis:   Esophageal cancer     Indication for treatment::  palliative       Radiation treatment dates:   07/22/19 - 08/09/19  Site/dose:   The patients chest/ esophagus was treated using a 2-field IMRT technique to a dose of 39 Gy in 13 fractions.    Narrative: The patient tolerated radiation treatment. He was able to complete the entirety of his treatment without substantial delay.    Plan: The patient has completed radiation treatment. The patient will return to radiation oncology clinic for routine followup in one month. I advised the patient to call or return sooner if they have any questions or concerns related to their recovery or treatment. ________________________________  Jodelle Gross, M.D., Ph.D.

## 2019-09-04 NOTE — Progress Notes (Signed)
Nutrition Brief Follow-up  Advanced patient's tube feeding, [employer] 1.4 (975 ml) to 40 ml/hr x 24 hours via PEG. Pt now on regular diet.  Will continue to monitor and advance as tolerated.   If nutrition issues arise, please consult RD.   Clayton Bibles, MS, RD, LDN Inpatient Clinical Dietitian Contact information available via Amion

## 2019-09-04 NOTE — Progress Notes (Signed)
OT Cancellation Note  Patient Details Name: Corbitt Cloke Bensch MRN: [id number] DOB: 1946-02-07   Cancelled Treatment:    Reason Eval/Treat Not Completed: Medical issues which prohibited therapy(orthostatic BP limiting tolerance for therapy.)  Lenward Chancellor 09/04/2019, 2:44 PM

## 2019-09-04 NOTE — Progress Notes (Signed)
Physical Therapy Treatment Patient Details Name: David [name] MRN: [id number] DOB: 1945/09/11 Today's Date: 09/04/2019    History of Present Illness 74 y.o. male with medical history significant of esophageal cancer on palliative chemotherapy, hypertension, dementia, hyperlipidemia, BPH and anxiety disorder and admitted with AMS/fever, found to have UTI and possible PNA .  Pt with recent admission for presyncope likely secondary to orthostasis.    PT Comments    Pt in low bed, floor mats and bed alarm active.  Pt with eyes closed but easily aroused.  General Comments: Hx dementia.  Easily aroused but slow to respond.  Was able to recall current place.  Did share he use to work at [employer] but with prompting and increased time to respond. Assisted OOB to take orthostatic vitals: Supine     BP 139/86  HR 93 EOB         BP 128/95  HR 105 Standing   BP 74/65  HR 101 dizzy "a little" Returned to EOB  BP 1490/88  HR 100 Returned to bed.  No c/o.  Positioned to comfort.  Unable to attempt gait.  Last admit pt was amb > 300 feet holding to IV pole.    Follow Up Recommendations  Home health PT;Supervision/Assistance - 24 hour;SNF(pending progress and family abilities)     Equipment Recommendations       Recommendations for Other Services       Precautions / Restrictions Precautions Precautions: Fall Precaution Comments: G tube, orthostatic,  TEDS + ABD Binder Restrictions Weight Bearing Restrictions: No    Mobility  Bed Mobility Overal bed mobility: Needs Assistance Bed Mobility: Supine to Sit     Supine to sit: Supervision;Min guard     General bed mobility comments: 75% repeat VC's to stay on task and complete.  Slow.  Delayed.  Easily drifts off.  Transfers Overall transfer level: Needs assistance Equipment used: Rolling walker (2 wheeled) Transfers: Sit to/from Stand Sit to Stand: Supervision;Min guard         General transfer comment: + 2 side by side assist for  safety but pt was able to self rise to standing.  C/o "dizzy" upon standing.  Orthostatic BP's taken.  Approx stance time < 60 seconds.  Ambulation/Gait             General Gait Details: unable to attempt amb due to severe drop in BP   Stairs             Wheelchair Mobility    Modified Rankin (Stroke Patients Only)       Balance                                            Cognition Arousal/Alertness: Awake/alert;Lethargic(50/50) Behavior During Therapy: Flat affect Overall Cognitive Status: No family/caregiver present to determine baseline cognitive functioning                                 General Comments: Hx dementia.  Easily aroused but slow to respond.  Was able to recall current place.  Did share he use to work at [employer] but with prompting and increased time to respond.      Exercises      General Comments        Pertinent Vitals/Pain Pain Assessment: No/denies pain  Home Living                      Prior Function            PT Goals (current goals can now be found in the care plan section) Progress towards PT goals: Progressing toward goals    Frequency    Min 3X/week      PT Plan Current plan remains appropriate    Co-evaluation              AM-PAC PT "6 Clicks" Mobility   Outcome Measure  Help needed turning from your back to your side while in a flat bed without using bedrails?: A Little Help needed moving from lying on your back to sitting on the side of a flat bed without using bedrails?: A Little Help needed moving to and from a bed to a chair (including a wheelchair)?: A Little Help needed standing up from a chair using your arms (e.g., wheelchair or bedside chair)?: A Little Help needed to walk in hospital room?: A Lot Help needed climbing 3-5 steps with a railing? : A Lot 6 Click Score: 16    End of Session Equipment Utilized During Treatment: Gait belt Activity  Tolerance: Treatment limited secondary to medical complications (Comment) Patient left: in bed;with call bell/phone within reach;with bed alarm set;with nursing/sitter in room Nurse Communication: Mobility status       Time: 1310-1335 PT Time Calculation (min) (ACUTE ONLY): 25 min  Charges:  $Therapeutic Activity: 23-37 mins                     Rica Koyanagi  PTA Acute  Rehabilitation Services Pager      [phone] Office      [phone]

## 2019-09-04 NOTE — Progress Notes (Signed)
Progress Note  Patient Name: David [name] Date of Encounter: 09/04/2019  Primary Cardiologist: Skeet Latch, MD   Subjective   Orthostatics yesterday appeared negative. He does not remember how he felt at that time. This morning while transferring from commode to the bed patient felt dizzy and lightheaded and needed to lay down.  Inpatient Medications    Scheduled Meds: . buPROPion  150 mg Oral Daily  . Droxidopa  100 mg Oral TID  . enoxaparin (LOVENOX) injection  40 mg Subcutaneous Q24H  . escitalopram  20 mg Oral Daily  . feeding supplement ([employer] STANDARD 1.4)  650 mL Per Tube Q24H  . fludrocortisone  0.2 mg Oral Daily  . iron polysaccharides  150 mg Oral Daily  . midodrine  10 mg Oral TID WC  . pantoprazole sodium  40 mg Oral BID  . saccharomyces boulardii  250 mg Oral BID  . sucralfate  1 g Oral TID WC & HS  . tamsulosin  0.4 mg Oral QHS  . vitamin B-12  1,000 mcg Oral Daily   Continuous Infusions: . 0.9 % NaCl with KCl 40 mEq / L 75 mL/hr at 09/04/19 0600   PRN Meds: acetaminophen, alum & mag hydroxide-simeth, LORazepam, ondansetron **OR** ondansetron (ZOFRAN) IV   Vital Signs    Vitals:   09/03/19 1315 09/03/19 2041 09/04/19 0358 09/04/19 0500  BP: 140/83 136/84 (!) 158/85   Pulse: 86 82 93   Resp: 16 18 17    Temp:  98.3 F (36.8 C) 98 F (36.7 C)   TempSrc:  Oral Oral   SpO2: 98% 100% 98%   Weight:    98 kg  Height:        Intake/Output Summary (Last 24 hours) at 09/04/2019 0839 Last data filed at 09/04/2019 0759 Gross per 24 hour  Intake 2877.5 ml  Output 1876 ml  Net 1001.5 ml   Last 3 Weights 09/04/2019 09/03/2019 09/02/2019  Weight (lbs) 216 lb 0.8 oz 220 lb 14.4 oz 221 lb  Weight (kg) 98 kg 100.2 kg 100.245 kg  Some encounter information is confidential and restricted. Go to Review Flowsheets activity to see all data.      Telemetry    NSR HR 80 with brief sinus tach to 104 bpm - Personally Reviewed  ECG    No new -  Personally Reviewed  Physical Exam   GEN: No acute distress.   Neck: No JVD Cardiac: RRR, no murmurs, rubs, or gallops.  Respiratory: Clear to auscultation bilaterally. GI: Soft, nontender, non-distended  MS: No edema; No deformity. Neuro:  Nonfocal  Psych: Normal affect   Labs    High Sensitivity Troponin:  No results for input(s): TROPONINIHS in the last 720 hours.    Chemistry Recent Labs  Lab 09/02/19 0457 09/03/19 0553 09/04/19 0519  NA 133* 133* 133*  K 3.5 3.9 3.4*  CL 101 103 102  CO2 23 23 24   GLUCOSE 103* 118* 128*  BUN 7* 6* 9  CREATININE 0.99 0.86 0.89  CALCIUM 10.5* 11.3* 10.9*  PROT 5.4* 5.6* 5.6*  ALBUMIN 3.0* 3.0* 3.1*  AST 31 27 27   ALT 45* 40 37  ALKPHOS 53 54 55  BILITOT 1.2 0.8 0.7  GFRNONAA >60 >60 >60  GFRAA >60 >60 >60  ANIONGAP 9 7 7      Hematology Recent Labs  Lab 09/02/19 0457 09/03/19 0553 09/04/19 0519  WBC 6.6 7.5 8.0  RBC 3.98* 4.21* 4.13*  HGB 11.4* 12.1* 12.1*  HCT  34.7* 37.1* 36.4*  MCV 87.2 88.1 88.1  MCH 28.6 28.7 29.3  MCHC 32.9 32.6 33.2  RDW 13.7 14.1 14.1  PLT 153 154 151    BNPNo results for input(s): BNP, PROBNP in the last 168 hours.   DDimer No results for input(s): DDIMER in the last 168 hours.   Radiology    No results found.  Cardiac Studies   Echo 08/07/19 EF 60-65% with no WMA  Patient Profile     74 y.o. male with pmh of esophageal cancer s/p palliative XRT recently started on Keytruda, dementia, depression who is being seen for orthostatic hypotension and near syncope.   Assessment & Plan    Orthostatic hypotension/Near syncope - Episode in April 4/27 (possible vasovagal) at oncologist apt where BP dropped and heart rate into the 40s. Since then has had several syncope episodes.  - Admitted 4/27-5/4 and has been given IFV but continued to have syncope. Started on midodrin 10mg  TID and TED hose placed with abdominal binder but still symptomatic. EF 60-65% - Patient was admitted after  persistent nausea and vomiting.  - He had positive orthostatics on admission however unsure if this is orthostatic or neurocardiogenic in the setting of N/V - Florinef increased to 0.2 mg BID - Northera 100 mg TID started yesterday - Thigh high hose  - Orthostatics looked normal yesterday however patient is still symptomatic - MD to see  AMS with underlying Dementia - AMS likely related to PNA and UTI - per IM  N/V - GI saw and EGD saw esophagitis likely from recent radiation - Symptoms improving. GI signed off  Esophageal cancer - esophageal mass resolved  Goals of care - patient is DNR  For questions or updates, please contact [place] Please consult [email or web address] for contact info under        Signed, Shaquille Janes Ninfa Meeker, PA-C  09/04/2019, 8:39 AM

## 2019-09-04 NOTE — Progress Notes (Signed)
HEMATOLOGY-ONCOLOGY PROGRESS NOTE  SUBJECTIVE: He appears unchanged.  No complaint.  He was treated with Zometa yesterday.  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     PHYSICAL EXAMINATION:  Vitals:   09/03/19 2041 09/04/19 0358  BP: 136/84 (!) 158/85  Pulse: 82 93  Resp: 18 17  Temp: 98.3 F (36.8 C) 98 F (36.7 C)  SpO2: 100% 98%   Filed Weights   09/02/19 0356 09/03/19 0500 09/04/19 0500  Weight: 221 lb (100.2 kg) 220 lb 14.4 oz (100.2 kg) 216 lb 0.8 oz (98 kg)    Intake/Output from previous day: 05/25 0701 - 05/26 0700 In: 2902.5 [P.O.:365; I.V.:1762.5; NG/GT:60; IV Piggyback:355] Out: 1451 [Urine:1450; Stool:1]  ABDOMEN:  soft, nontender, abdominal binder in place NEURO: alert and follows commands Vascular: Support stockings in place of the lower legs, no edema  LABORATORY DATA:  I have reviewed the data as listed CMP Latest Ref Rng & Units 09/04/2019 09/03/2019 09/02/2019  Glucose 70 - 99 mg/dL 128(H) 118(H) 103(H)  BUN 8 - 23 mg/dL 9 6(L) 7(L)  Creatinine 0.61 - 1.24 mg/dL 0.89 0.86 0.99  Sodium 135 - 145 mmol/L 133(L) 133(L) 133(L)  Potassium 3.5 - 5.1 mmol/L 3.4(L) 3.9 3.5  Chloride 98 - 111 mmol/L 102 103 101  CO2 22 - 32 mmol/L _0 Calcium 8.9 - 10.3 mg/dL 10.9(H) 11.3(H) 10.5(H)  Total Protein 6.5 - 8.1 g/dL 5.6(L) 5.6(L) 5.4(L)  Total Bilirubin 0.3 - 1.2 mg/dL 0.7 0.8 1.2  Alkaline Phos 38 - 126 U/L 55 54 53  AST 15 - 41 U/L _1 ALT 0 - 44 U/L 37 40 45(H)    Lab Results  Component Value Date   WBC 8.0 09/04/2019   HGB 12.1 (L) 09/04/2019   HCT 36.4 (L) 09/04/2019   MCV 88.1 09/04/2019   PLT 151  09/04/2019   NEUTROABS 6.4 09/04/2019    DG Chest 2 View  Result Date: 08/25/2019 CLINICAL DATA:  Fever. EXAM: CHEST - 2 VIEW COMPARISON:  Chest radiograph 05/06/2019. PET CT 07/05/2019 FINDINGS: Upper normal heart size. Aortic atherosclerosis. Right paratracheal soft tissue density corresponds to adenopathy on PET. Streaky right infrahilar opacities. No focal airspace disease, pleural effusion, or pneumothorax. No acute osseous abnormalities are seen. IMPRESSION: 1. Streaky right infrahilar opacities may be atelectasis or potentially aspiration. 2. Right paratracheal adenopathy. Electronically Signed   By: Keith Rake M.D.   On: 08/25/2019 23:19   DG Abd 1 View  Result Date: 08/29/2019 CLINICAL DATA:  Intractable nausea and vomiting. History of esophageal cancer. EXAM: ABDOMEN - 1 VIEW COMPARISON:  08/05/2019 abdominal film and CT scan 08/28/2019 FINDINGS: There is scattered contrast in the right colon and rectum. No findings for obstruction or perforation. IMPRESSION: No findings for bowel obstruction. Electronically Signed   By: Marijo Sanes M.D.   On: 08/29/2019 08:05   CT HEAD WO CONTRAST  Result Date: 08/26/2019 CLINICAL DATA:  Facial trauma, fall EXAM: CT HEAD WITHOUT CONTRAST TECHNIQUE: Contiguous axial images were obtained from the base of the skull through the vertex without intravenous contrast. COMPARISON:  08/25/2019 FINDINGS: Brain: There is atrophy and chronic small  vessel disease changes. No acute intracranial abnormality. Specifically, no hemorrhage, hydrocephalus, mass lesion, acute infarction, or significant intracranial injury. Vascular: No hyperdense vessel or unexpected calcification. Skull: No acute calvarial abnormality. Sinuses/Orbits: Visualized paranasal sinuses and mastoids clear. Orbital soft tissues unremarkable. Other: None IMPRESSION: Atrophy, chronic microvascular disease. No acute intracranial abnormality. Electronically Signed   By: Rolm Baptise M.D.   On:  08/26/2019 18:31   CT Head Wo Contrast  Result Date: 08/25/2019 CLINICAL DATA:  Encephalopathy, nausea, and dizziness EXAM: CT HEAD WITHOUT CONTRAST TECHNIQUE: Contiguous axial images were obtained from the base of the skull through the vertex without intravenous contrast. COMPARISON:  CT 08/06/2019 FINDINGS: Brain: No evidence of acute infarction, hemorrhage, hydrocephalus, extra-axial collection or mass lesion/mass effect. Symmetric prominence of the ventricles, cisterns and sulci compatible with parenchymal volume loss. Confluent and more patchy areas of white matter hypoattenuation are most compatible with chronic microvascular angiopathy. Vascular: Atherosclerotic calcification of the carotid siphons and intradural vertebral arteries. No hyperdense vessel. Skull: No calvarial fracture or suspicious osseous lesion. No scalp swelling or hematoma. Sinuses/Orbits: Paranasal sinuses and mastoid air cells are predominantly clear. Pneumatization of the petrous apices. Orbital structures are unremarkable aside from prior lens extractions. Other: None IMPRESSION: 1. No acute intracranial abnormality. 2. Chronic stable parenchymal volume loss and chronic microvascular angiopathy. Electronically Signed   By: Lovena Le M.D.   On: 08/25/2019 23:32   CT HEAD WO CONTRAST  Result Date: 08/06/2019 CLINICAL DATA:  Recurrent syncope EXAM: CT HEAD WITHOUT CONTRAST TECHNIQUE: Contiguous axial images were obtained from the base of the skull through the vertex without intravenous contrast. COMPARISON:  05/06/2019 FINDINGS: Brain: There is no mass, hemorrhage or extra-axial collection. The appearance of the white matter is normal for the patient's age. There is generalized atrophy. Vascular: No abnormal hyperdensity of the major intracranial arteries or dural venous sinuses. No intracranial atherosclerosis. Skull: The visualized skull base, calvarium and extracranial soft tissues are normal. Sinuses/Orbits: No fluid levels  or advanced mucosal thickening of the visualized paranasal sinuses. No mastoid or middle ear effusion. The orbits are normal. IMPRESSION: Generalized atrophy without acute intracranial abnormality. Electronically Signed   By: Ulyses Jarred M.D.   On: 08/06/2019 21:42   MR BRAIN WO CONTRAST  Result Date: 08/09/2019 CLINICAL DATA:  Dizziness, dehydration or hypotension. Additional history provided: Medical history significant for esophageal cancer on palliative chemotherapy, hypertension, dementia, hyperlipidemia, BPH, anxiety disorder presenting with intermittent dizziness. EXAM: MRI HEAD WITHOUT CONTRAST TECHNIQUE: Multiplanar, multiecho pulse sequences of the brain and surrounding structures were obtained without intravenous contrast. COMPARISON:  Noncontrast head CT 08/06/2019, brain MRI 10/28/2017. FINDINGS: Brain: Please note evaluation for intracranial metastatic disease is limited on this noncontrast study. There are few scattered punctate foci of T2/FLAIR hyperintensity within the cerebral white matter, unchanged and nonspecific. Stable moderate correct atrophy with somewhat disproportionate involvement of the medial temporal lobes. There is no acute infarct. No evidence of intracranial mass. No chronic intracranial blood products. No extra-axial fluid collection. No midline shift. Vascular: Expected proximal arterial flow voids. Skull and upper cervical spine: No focal marrow lesion. Sinuses/Orbits: Visualized orbits show no acute finding. Minimal ethmoid sinus mucosal thickening. No significant mastoid effusion. IMPRESSION: 1. Please note evaluation for intracranial metastatic disease is limited on this non-contrast exam. 2. No evidence of acute intracranial abnormality. 3. Stable moderate parenchymal atrophy with somewhat disproportionate involvement of the medial temporal lobes. 4. A few scattered punctate T2 hyperintense signal changes within the cerebral white matter are unchanged and nonspecific.  5. Minimal ethmoid sinus mucosal thickening. Electronically Signed   By: Kellie Simmering DO   On: 08/09/2019 15:38   CT ABDOMEN PELVIS W CONTRAST  Result Date: 08/28/2019 CLINICAL DATA:  Nausea and vomiting, history of esophageal cancer EXAM: CT ABDOMEN AND PELVIS WITH CONTRAST TECHNIQUE: Multidetector CT imaging of the abdomen and pelvis was performed using the standard protocol following bolus administration of intravenous contrast. CONTRAST:  179m OMNIPAQUE IOHEXOL 300 MG/ML  SOLN COMPARISON:  05/06/2019, 07/05/2019 FINDINGS: Lower chest: Circumferential wall thickening of the distal esophagus again noted, consistent with known esophageal cancer. Overall, significant improvement since prior PET scan. No acute airspace disease.  Trace right pleural effusion. Hepatobiliary: Minimal gallbladder sludge. No calcified gallstones or acute cholecystitis. Liver is unremarkable. Pancreas: Unremarkable. No pancreatic ductal dilatation or surrounding inflammatory changes. Spleen: Spleen is enlarged measuring 15.8 cm in craniocaudal length, grossly stable. Adrenals/Urinary Tract: There is a nonobstructing 8 mm distal left ureteral calculus. Punctate 3 mm nonobstructing right renal calculus is noted. No obstructive uropathy within either kidney. The kidneys enhance normally and symmetrically. The adrenals are unremarkable. Bladder is moderately distended without focal abnormality. Stomach/Bowel: No bowel obstruction or ileus. Normal appendix right lower quadrant. Percutaneous gastrostomy tube is seen within the gastric lumen. Vascular/Lymphatic: There is progressive retroperitoneal adenopathy. Left para-aortic adenopathy measures up to 16 mm in short axis reference image 31. The lymphadenopathy at the celiac axis and within the gastrohepatic ligament on prior PET scan are no longer identified. Minimal atherosclerosis of the aorta unchanged. Reproductive: Prostate is mildly enlarged but stable. Other: No abdominal wall hernia  or abnormality. No abdominopelvic ascites. Musculoskeletal: No acute or destructive bony lesions. Reconstructed images demonstrate no additional findings. IMPRESSION: 1. Persistent distal esophageal mass consistent with known esophageal cancer. Overall improvement since recent PET scan. 2. Progressive retroperitoneal lymphadenopathy, with new and enlarging lymph nodes as above. The previously seen celiac and gastrohepatic ligament adenopathy has resolved. 3. Stable splenomegaly. 4. Trace right pleural effusion. 5. Stable bilateral urinary tract calculi without obstruction. Electronically Signed   By: MRanda NgoM.D.   On: 08/28/2019 21:15   DG ABDOMEN PEG TUBE LOCATION  Result Date: 08/05/2019 CLINICAL DATA:  Peg tube evaluation. EXAM: ABDOMEN - 1 VIEW COMPARISON:  IR gastrostomy placement 07/08/2019 FINDINGS: A single frontal view of the abdomen is provided. A PEG tube balloon is seen in the left upper quadrant projecting over the stomach. There is contrast within the stomach and proximal duodenum. No evidence of extraluminal contrast. The bowel gas pattern is nonobstructive. IMPRESSION: PEG tube balloon projects over the stomach. Contrast is seen within the stomach and proximal duodenum. No evidence of extraluminal contrast. Electronically Signed   By: NAudie PintoM.D.   On: 08/05/2019 18:06   DG CHEST PORT 1 VIEW  Result Date: 08/31/2019 CLINICAL DATA:  Shortness of breath EXAM: PORTABLE CHEST 1 VIEW COMPARISON:  08/25/2019 FINDINGS: Normal heart size. Convexity of the upper right mediastinum from adenopathy by PET CT. There is no edema, consolidation, effusion, or pneumothorax. IMPRESSION: 1. No acute finding. 2. Known thoracic adenopathy. Electronically Signed   By: JMonte FantasiaM.D.   On: 08/31/2019 07:34    ASSESSMENT AND PLAN: 1. Esophagus cancer ? CT 05/06/2019-circumferential thickening of the distal esophagus, mild splenomegaly, bladder wall thickening, enlarged prostate ? Upper  endoscopy 06/11/2019-ulcerated mass at the lower esophagus, 36 cm from the incisors, partially obstructing, biopsy revealed atypical cells-reactive change versus poorly differentiated neoplasm[facility]  biopsy 06/21/2019 confirmed a poorly differentiated malignant neoplasm ? HER-2 negative, PD-L1 combined positive score -20 ? Repeat endoscopy 06/21/2019-partially obstructing esophageal tumor found in the lower third of the esophagus ? PET scan 07/05/2019-large esophageal mass with nodal involvement both above and below the diaphragm including a left periaortic node ? Palliative radiation to the esophagus mass starting 07/22/2019, completed 08/09/2019 ? Cycle 1 pembrolizumab 08/15/2019 ? Upper endoscopy 08/31/2019-no esophageal mass, no stricture, biopsy with inflammation, indefinite for dysplasia 2. Dementia 3. Anxiety/depression 4. Sleep apnea 5. BPH 6. Dysphagia secondary to #1  Gastrostomy tube placement 07/08/2019 7. Hyperlipidemia 8. Admission with hematemesis 07/21/2019 9. Admission for dehydration and pre-syncope in clinic 08/06/19 10. Recurrent nausea and vomiting-etiology unclear, improved 11. Hypercalcemia, status post Zometa 09/03/2019 12. Orthostasis   David [name] appears stable.  He was treated for hypercalcemia yesterday.  The calcium is lower today.  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  A PTH level is pending.  He appears to be tolerating the tube feedings at present.  He has been advanced to a regular diet.  He may be able to come off of tube feedings if he is able to maintain his hydration and nutrition by mouth.  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  He was seen by cardiology today. Recommendations: 1.  Continue regular diet, consider stopping tube feedings if he is able to maintain nutrition by mouth 2.  He is currently  scheduled for cycle 2 of pembrolizumab this Thursday.  We will plan to administer this at the cancer center if he is discharged.  However, we will need to delay this if he remains inpatient. 3.  Await placement at SNF.   LOS: 9 days   Betsy Coder 09/04/19

## 2019-09-05 ENCOUNTER — Inpatient Hospital Stay: Payer: Medicare Other | Admitting: Nurse Practitioner

## 2019-09-05 ENCOUNTER — Encounter: Payer: Self-pay | Admitting: Dietician

## 2019-09-05 ENCOUNTER — Inpatient Hospital Stay: Payer: Medicare Other

## 2019-09-05 ENCOUNTER — Inpatient Hospital Stay: Payer: Medicare Other | Admitting: Nutrition

## 2019-09-05 LAB — PTH, INTACT AND CALCIUM
Calcium, Total (PTH): 10.7 mg/dL — ABNORMAL HIGH (ref 8.6–10.2)
PTH: 9 pg/mL — ABNORMAL LOW (ref 15–65)

## 2019-09-05 LAB — BASIC METABOLIC PANEL
Anion gap: 10 (ref 5–15)
BUN: 10 mg/dL (ref 8–23)
CO2: 21 mmol/L — ABNORMAL LOW (ref 22–32)
Calcium: 9.6 mg/dL (ref 8.9–10.3)
Chloride: 102 mmol/L (ref 98–111)
Creatinine, Ser: 0.79 mg/dL (ref 0.61–1.24)
GFR calc Af Amer: >60 mL/min (ref >60)
GFR calc non Af Amer: >60 mL/min (ref >60)
Glucose, Bld: 138 mg/dL — ABNORMAL HIGH (ref 70–99)
Potassium: 3.5 mmol/L (ref 3.5–5.1)
Sodium: 133 mmol/L — ABNORMAL LOW (ref 135–145)

## 2019-09-05 LAB — GLUCOSE, CAPILLARY
Glucose-Capillary: 124 mg/dL — ABNORMAL HIGH (ref 70–99)
Glucose-Capillary: 130 mg/dL — ABNORMAL HIGH (ref 70–99)
Glucose-Capillary: 97 mg/dL (ref 70–99)
Glucose-Capillary: 83 mg/dL (ref 70–99)
Glucose-Capillary: 103 mg/dL — ABNORMAL HIGH (ref 70–99)

## 2019-09-05 MED ORDER — KATE FARMS STANDARD 1.4 PO LIQD: 650.0000 mL | ORAL | Status: DC

## 2019-09-05 MED ORDER — KATE FARMS STANDARD 1.4 PO LIQD
325.0000 mL | Freq: Two times a day (BID) | ORAL | Status: DC
  Administered 2019-09-05 – 2019-09-10 (×11): 325 mL via ORAL
  Filled 2019-09-05 (×11): qty 325

## 2019-09-05 MED ORDER — KATE FARMS STANDARD 1.4 PO LIQD
325.0000 mL | ORAL | Status: DC
  Administered 2019-09-05 – 2019-09-09 (×5): 325 mL
  Filled 2019-09-05 (×6): qty 325

## 2019-09-05 MED FILL — Droxidopa Cap 100 MG: ORAL | Qty: 1 | Status: AC

## 2019-09-05 NOTE — Progress Notes (Signed)
Occupational Therapy Treatment Patient Details Name: David [name] MRN: [id number] DOB: June 18, 1945 Today's Date: 09/05/2019    History of present illness 74 y.o. male with medical history significant of esophageal cancer on palliative chemotherapy, hypertension, dementia, hyperlipidemia, BPH and anxiety disorder and admitted with AMS/fever, found to have UTI and possible PNA .  Pt with recent admission for presyncope likely secondary to orthostasis.   OT comments  Treatment focused on improving patient's activity tolerance with self care tasks. Patient required min assist and two hand hold assists to perform [facility] transfer today. Appears fearful with standing and exhibited mild labored breathing with ADL activity. Patient continues to demonstrate impaired balance and poor activity tolerance. Cont to recommend SNF at discharge.   Follow Up Recommendations  SNF;Supervision/Assistance - 24 hour    Equipment Recommendations  None recommended by OT    Recommendations for Other Services      Precautions / Restrictions Precautions Precautions: Fall Precaution Comments: G tube, orthostatic,  TEDS + ABD Binder Restrictions Weight Bearing Restrictions: No       Mobility Bed Mobility                  Transfers                      Balance                                           ADL either performed or assessed with clinical judgement   ADL                           Toilet Transfer: Minimal assistance;BSC   Toileting- Clothing Manipulation and Hygiene: Maximal assistance         General ADL Comments: Min assist to stand from bed height and transfer to close [facility]. Patient holding onto therapist and bed rails and appears fearful with standing and transfers. Patient using to hand hold assists with standing and return to bed. Patient max assist for toileting as he needing to hold on to steady himself. No complaints of dizziness  initially. Patient exhibits mild labored breathing during activity - o2 sat and HR WN - and reports fatigue with small ADL activity.  After transfer back to sitting at edge of bed and then return to supine did patient report dizziness.     Vision       Perception     Praxis      Cognition Arousal/Alertness: Awake/alert;Lethargic Behavior During Therapy: WFL for tasks assessed/performed Overall Cognitive Status: History of cognitive impairments - at baseline                                          Exercises     Shoulder Instructions       General Comments      Pertinent Vitals/ Pain          Home Living                                          Prior Functioning/Environment  Frequency  Min 2X/week        Progress Toward Goals  OT Goals(current goals can now be found in the care plan section)        Plan Discharge plan remains appropriate    Co-evaluation                 AM-PAC OT "6 Clicks" Daily Activity     Outcome Measure                    End of Session Equipment Utilized During Treatment: Gait belt  OT Visit Diagnosis: Other symptoms and signs involving cognitive function;History of falling (Z91.81);Repeated falls (R29.6);Dizziness and giddiness (R42)   Activity Tolerance Patient limited by fatigue;Other (comment)   Patient Left in bed;with bed alarm set;with nursing/sitter in room   Nurse Communication Mobility status;Other (comment)(BP)        Time: 1057-1110 OT Time Calculation (min): 13 min  Charges: OT General Charges $OT Visit: 1 Visit OT Treatments $Self Care/Home Management : 8-22 mins  David [name], OTR/L Acute Care Rehab Services  Office [phone]    David [name] 09/05/2019, 4:52 PM

## 2019-09-05 NOTE — Progress Notes (Signed)
PROGRESS NOTE    David [name]  QIH:[id number] DOB: 1946-03-31 DOA: 08/25/2019 PCP: Leighton Ruff, MD   Brief Narrative: David [name] is a 74 y.o. malewithhistory of esophageal cancer PEG tube placement, BPH, dementia, depression. Patient presented secondary to worsening confusion, nausea and vomiting and found to have evidence of pneumonia in addition to hypercalcemia.   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 Mental status appears to have improved and at baseline.  Pneumonia Chest x-ray concerning for infiltrates. Patient treated with Ceftriaxone and azithromycin. Completed course.  Hyponatremia Mild. Stable.  Hypercalcemia Given Zometa on 5/25. Calcium improved. Oncology on board.  Nausea/vomiting PEG tube in place. EGD significant for esophagitis. Improved. -Regular diet  Orthostatic hypotension Cardiology consulted. Patient is on Florinef, midodrine and Droxidopa. Still orthostatic with symptoms. -Continue PT  Depression -Continue Wellbutrin and Lexapro  Thrombocytopenia Stable.  Esophageal cancer Patient is followed by medical oncology. No mass seen on recent EGD. Patient is s/p radiation. PEG tube placed secondary to issues with oral intake secondary to mass.  Urinary frequency BPH Patient is on Tamsulosin  Dementia Patient likely baseline per chart review. Patient states he feels confused. -Will trial scheduled voiding to minimize patient urge to attempt to get up  Nephrolithiasis Non-obstructing.  Goals of care Patient is now DNR. Hospice referral was made for patient for possible home  hospice.   DVT prophylaxis: Lovenox Code Status:   Code Status: DNR Family Communication: None at bedside. Disposition Plan: Discharge pending improvement of symptomatic orthostatic hypotension as he is unsafe to ambulate; high fall risk. Discontinuing sitter today to see if patient can safely be managed   Consultants:   Medical oncology  Procedures:   EGD (08/31/2019) Impression:               - Esophagitis. Biopsied. NO OBVIOUS ESOPHAGEAL MASS                           - No gross lesions in the stomach.                           - Normal examined duodenum.  Recommendation:           - Advance diet as tolerated.                           - Continue present medications.  Antimicrobials:  Ceftriaxone  Azithromycin    Subjective: Concerned about his clothing being lost. Otherwise, no issues.  Objective: Vitals:   09/04/19 1500 09/04/19 2037 09/05/19 0355 09/05/19 1514  BP: (!) 168/87 (!) 159/81 (!) 143/83 (!) 160/87  Pulse: 74 79 89 84  Resp: 18 18 19 20   Temp: 98.3 F (36.8 C) 98.4 F (36.9 C) 98.3 F (36.8 C) 98.1 F (36.7 C)  TempSrc: Oral Oral Oral Oral  SpO2: 98% 98% 95% 100%  Weight:      Height:        Intake/Output Summary (Last 24 hours) at 09/05/2019 1726 Last data filed at 09/05/2019 0902 Gross per 24 hour  Intake 240  ml  Output 1025 ml  Net -785 ml   Filed Weights   09/02/19 0356 09/03/19 0500 09/04/19 0500  Weight: 100.2 kg 100.2 kg 98 kg    Examination:  General exam: Appears calm and comfortable Respiratory system: Clear to auscultation. Respiratory effort normal. Cardiovascular system: S1 & S2 heard, RRR. No murmurs, rubs, gallops or clicks. Gastrointestinal system: Abdomen is nondistended, soft and nontender. No organomegaly or masses felt. Normal bowel sounds heard. Central nervous system: Alert and oriented. Musculoskeletal: No edema. No calf tenderness Skin: No cyanosis. No rashes Psychiatry: Judgement and insight appear slightly  impaired. Mood & affect appropriate.    Data Reviewed: I have personally reviewed following labs and imaging studies  CBC    Component Value Date/Time   WBC 8.0 09/04/2019 0519   RBC 4.13 (L) 09/04/2019 0519   HGB 12.1 (L) 09/04/2019 0519   HGB 14.2 08/22/2019 1350   HCT 36.4 (L) 09/04/2019 0519   PLT 151 09/04/2019 0519   PLT 132 (L) 08/22/2019 1350   MCV 88.1 09/04/2019 0519   MCH 29.3 09/04/2019 0519   MCHC 33.2 09/04/2019 0519   RDW 14.1 09/04/2019 0519   LYMPHSABS 0.9 09/04/2019 0519   MONOABS 0.5 09/04/2019 0519   EOSABS 0.1 09/04/2019 0519   BASOSABS 0.0 09/04/2019 0519     CMP     Component Value Date/Time   NA 133 (L) 09/05/2019 0552   K 3.5 09/05/2019 0552   CL 102 09/05/2019 0552   CO2 21 (L) 09/05/2019 0552   GLUCOSE 138 (H) 09/05/2019 0552   BUN 10 09/05/2019 0552   CREATININE 0.79 09/05/2019 0552   CREATININE 1.25 (H) 08/22/2019 1350   CALCIUM 9.6 09/05/2019 0552   CALCIUM 10.7 (H) 09/04/2019 0519   PROT 5.6 (L) 09/04/2019 0519   ALBUMIN 3.1 (L) 09/04/2019 0519   AST 27 09/04/2019 0519   AST 20 08/22/2019 1350   ALT 37 09/04/2019 0519   ALT 17 08/22/2019 1350   ALKPHOS 55 09/04/2019 0519   BILITOT 0.7 09/04/2019 0519   BILITOT 1.0 08/22/2019 1350   GFRNONAA >60 09/05/2019 0552   GFRNONAA 57 (L) 08/22/2019 1350   GFRAA >60 09/05/2019 0552   GFRAA >60 08/22/2019 1350    CBG (last 3)  Recent Labs    09/05/19 0744 09/05/19 1224 09/05/19 1604  GLUCAP 130* 97 83     GFR: Estimated Creatinine Clearance: 97 mL/min (by C-G formula based on SCr of 0.79 mg/dL).  Estimated Creatinine Clearance: 97 mL/min (by C-G formula based on SCr of 0.79 mg/dL).  Liver Function Tests: Recent Labs  Lab 08/31/19 0539 09/01/19 0543 09/02/19 0457 09/03/19 0553 09/04/19 0519  AST 34 38 31 27 27   ALT 47* 49* 45* 40 37  ALKPHOS 51 54 53 54 55  BILITOT 0.8 0.8 1.2 0.8 0.7  PROT 5.5* 5.5* 5.4* 5.6* 5.6*  ALBUMIN 2.9* 3.0* 3.0* 3.0* 3.1*   No results for  input(s): LIPASE, AMYLASE in the last 168 hours. No results for input(s): AMMONIA in the last 168 hours.  Coagulation Profile: No results for input(s): INR, PROTIME in the last 168 hours.  No results found for this or any previous visit (from the past 240 hour(s)).      Radiology Studies: No results found.      Scheduled Meds: . buPROPion  150 mg Oral Daily  . Droxidopa  100 mg Oral TID  . enoxaparin (LOVENOX) injection  40 mg Subcutaneous Q24H  . escitalopram  20 mg Oral  Daily  . feeding supplement (KATE FARMS STANDARD 1.4)  325 mL Oral BID BM  . feeding supplement (KATE [employer]ARMS STANDARD 1.4)  325 mL Per Tube Q24H  . fludrocortisone  0.2 mg Oral Daily  . iron polysaccharides  150 mg Oral Daily  . midodrine  10 mg Oral TID WC  . pantoprazole sodium  40 mg Oral BID  . saccharomyces boulardii  250 mg Oral BID  . sucralfate  1 g Oral TID WC & HS  . tamsulosin  0.4 mg Oral QHS  . vitamin B-12  1,000 mcg Oral Daily   Continuous Infusions: . 0.9 % NaCl with KCl 40 mEq / L 75 mL/hr (09/04/19 1112)     LOS: 10 days     Cordelia Poche, MD [facility] 09/05/2019, 5:26 PM  If 7PM-7AM, please contact night-coverage [email or web address]

## 2019-09-05 NOTE — Progress Notes (Signed)
Nutrition Follow-up  INTERVENTION:   -Switch tube feeds to night regimen of 325 ml (1 carton) [employer] 1.4 @ 25 ml/hr x 12 hours via PEG. [employer] 1.4 PO BID between meals, each providing 455 kcals and 20g protein. -TF + oral supplementation provides 1365 kcals and 60g protein -Continue and encourage PO intakes of meals  NUTRITION DIAGNOSIS:   Increased nutrient needs related to cancer and cancer related treatments as evidenced by estimated needs.  Ongoing.  GOAL:   Patient will meet greater than or equal to 90% of their needs  Progressing.  MONITOR:   PO intake, Supplement acceptance, Labs, Weight trends, I & O's  ASSESSMENT:   74 y.o. male with history of esophageal cancer PEG tube placement BPH dementia depression who was recently admitted for syncope on August 06, 2019 discharged on Aug 13, 2019 was observed to be increasingly weak over the last 4 to 5 days and getting more confused.  Patient also had some vomiting and patient's oncologist Dr. Learta Codding advised to stop the enteral feeds for 2 days.  And it was restarted again 2 days ago following which later in the evening patient had some vomiting patient wife also noticed some fevers around around 99 degrees.  Patient tolerating [employer] 1.4 @ 40 ml/hr x 24 hrs. Per Dr. Gearldine Shown note, would like for patient to transition to oral diet if cant tolerate. Will decrease TF regimen and switch to night feeds of 325 ml (1 carton) of [employer] 1.4 @ 25 ml/hr x 12 hrs.  Pt can then have [employer] as oral supplement to provide additional kcals and protein during the day.   Admission weight: 213 lbs. Current weight: 216 lbs.  I/Os: +1.7L since admit UOP: 2750 ml x 24 hrs  Medications: Niferex, Florastor, Vitamin B-12 Labs reviewed:  CBGs: 124-130 Low Na  Diet Order:   Diet Order            Diet regular Room service appropriate? Yes; Fluid consistency: Thin  Diet effective now              EDUCATION NEEDS:   No  education needs have been identified at this time  Skin:  Skin Assessment: Reviewed RN Assessment  Last BM:  5/27 -type 7  Height:   Ht Readings from Last 1 Encounters:  08/25/19 6\' 2"  (1.88 m)    Weight:   Wt Readings from Last 1 Encounters:  09/04/19 98 kg    Ideal Body Weight:     BMI:  Body mass index is 27.74 kg/m.  Estimated Nutritional Needs:   Kcal:  2300-2500  Protein:  115-130g  Fluid:  2.3L/day  Clayton Bibles, MS, RD, LDN Inpatient Clinical Dietitian Contact information available via Amion

## 2019-09-05 NOTE — Progress Notes (Unsigned)
Nutrition  Patient was scheduled for nutrition follow-up today during infusion. Patient is currently admitted to [facility], treatment cancelled today. Nutrition follow-up to be completed as able.   Lajuan Lines, RD, LDN Clinical Nutrition After Hours/Weekend Pager # in [place]

## 2019-09-06 ENCOUNTER — Telehealth: Payer: Self-pay | Admitting: Oncology

## 2019-09-06 LAB — GLUCOSE, CAPILLARY
Glucose-Capillary: 110 mg/dL — ABNORMAL HIGH (ref 70–99)
Glucose-Capillary: 98 mg/dL (ref 70–99)
Glucose-Capillary: 93 mg/dL (ref 70–99)
Glucose-Capillary: 102 mg/dL — ABNORMAL HIGH (ref 70–99)

## 2019-09-06 LAB — BASIC METABOLIC PANEL
Anion gap: 7 (ref 5–15)
BUN: 10 mg/dL (ref 8–23)
CO2: 22 mmol/L (ref 22–32)
Calcium: 9.4 mg/dL (ref 8.9–10.3)
Chloride: 106 mmol/L (ref 98–111)
Creatinine, Ser: 0.87 mg/dL (ref 0.61–1.24)
GFR calc Af Amer: >60 mL/min (ref >60)
GFR calc non Af Amer: >60 mL/min (ref >60)
Glucose, Bld: 110 mg/dL — ABNORMAL HIGH (ref 70–99)
Potassium: 3.9 mmol/L (ref 3.5–5.1)
Sodium: 135 mmol/L (ref 135–145)

## 2019-09-06 NOTE — Progress Notes (Signed)
PT Cancellation Note  Patient Details Name: David [name] MRN: [id number] DOB: [birth year]   Cancelled Treatment:    Reason Eval/Treat Not Completed: Fatigue/lethargy limiting ability to participate;Other (comment)(Pt resting in bed and reports feeling tired and disoriented. He is oriented to self but requires cues for place and date/time. He is somewhat oriented to situation and further discussion is provided for pt. Despite education on importance of mobilizing he requests to wait due to fatigue and disorented feeling. Pt asks for therapy to return later if able. Will follow up at later date/time as schedule allows.)  Verner Mould, DPT Physical Therapist with [facility] [phone]  09/06/2019 10:47 AM

## 2019-09-06 NOTE — Progress Notes (Signed)
Pt refused orthostatic bp's, I will continue to reeducate.

## 2019-09-06 NOTE — Care Management Important Message (Signed)
Important Message  Patient Details IM Letter given to Case Manager to present to the Patient Name: David [name] MRN: [id number] Date of Birth: [birth year]   Medicare Important Message Given:  Yes     Kerin Salen 09/06/2019, 4:40 PM

## 2019-09-06 NOTE — Progress Notes (Addendum)
PROGRESS NOTE    David [name]  POE:[id number] DOB: November 05, 1945 DOA: 08/25/2019 PCP: Leighton Ruff, MD   Brief Narrative: David [name] is a 74 y.o. malewithhistory of esophageal cancer PEG tube placement, BPH, dementia, depression. Patient presented secondary to worsening confusion, nausea and vomiting and found to have evidence of pneumonia in addition to hypercalcemia.   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 Mental status appears to have improved and at baseline.  Pneumonia Chest x-ray concerning for infiltrates. Patient treated with Ceftriaxone and azithromycin. Completed course.  Hyponatremia Mild. Stable. Nutrition and fluids.  Hypercalcemia Given Zometa on 5/25. Calcium improved. Oncology on board. Calcium stable.  Nausea/vomiting PEG tube in place. EGD significant for esophagitis. Improved. -Regular diet  Orthostatic hypotension Cardiology consulted. Patient is on Florinef, midodrine and Droxidopa. Orthostatic vitals improved. -Continue PT -Continue orthostatic vitals  Depression -Continue Wellbutrin and Lexapro  Thrombocytopenia Stable.  Esophageal cancer Patient is followed by medical oncology. No mass seen on recent EGD. Patient is s/p radiation. PEG tube placed secondary to issues with oral intake secondary to mass.  Urinary frequency BPH Patient is on Tamsulosin  Dementia Patient likely baseline per chart review. Patient states he feels confused. -Schedule voiding  Nephrolithiasis Non-obstructing.  Goals of care Patient is now DNR. Hospice referral was made for patient for  possible home hospice.   DVT prophylaxis: Lovenox Code Status:   Code Status: DNR Family Communication: None at bedside. Wife on telephone Disposition Plan: Discharge to SNF when bed available.   Consultants:   Medical oncology  Procedures:   EGD (08/31/2019) Impression:               - Esophagitis. Biopsied. NO OBVIOUS ESOPHAGEAL MASS                           - No gross lesions in the stomach.                           - Normal examined duodenum.  Recommendation:           - Advance diet as tolerated.                           - Continue present medications.  Antimicrobials:  Ceftriaxone  Azithromycin    Subjective: No issues  Objective: Vitals:   09/05/19 2023 09/05/19 2025 09/06/19 0500 09/06/19 0619  BP: 138/87 112/79  (!) 142/96  Pulse: 92 97  82  Resp: 18 18  18   Temp: 98.6 F (37 C) 98.6 F (37 C)  97.6 F (36.4 C)  TempSrc:    Oral  SpO2: 100% 99%  100%  Weight:   98.4 kg   Height:        Intake/Output Summary (Last 24 hours) at 09/06/2019 1339 Last data filed at 09/06/2019 1120 Gross per 24 hour  Intake --  Output 650 ml  Net -650 ml   Filed Weights   09/03/19 0500 09/04/19 0500 09/06/19 0500  Weight: 100.2 kg 98 kg 98.4 kg    Examination:  General  exam: Appears calm and comfortable Respiratory system: Clear to auscultation. Respiratory effort normal. Cardiovascular system: S1 & S2 heard, RRR. No murmurs, rubs, gallops or clicks. Gastrointestinal system: Abdomen is nondistended, soft and nontender. No organomegaly or masses felt. Normal bowel sounds heard. Central nervous system: Alert and oriented.  Musculoskeletal: No edema. No calf tenderness Skin: No cyanosis. Red confluent rash noted around perineum    Data Reviewed: I have personally reviewed following labs and imaging studies  CBC    Component Value Date/Time   WBC 8.0 09/04/2019 0519   RBC 4.13 (L) 09/04/2019 0519   HGB 12.1 (L) 09/04/2019 0519   HGB 14.2 08/22/2019 1350    HCT 36.4 (L) 09/04/2019 0519   PLT 151 09/04/2019 0519   PLT 132 (L) 08/22/2019 1350   MCV 88.1 09/04/2019 0519   MCH 29.3 09/04/2019 0519   MCHC 33.2 09/04/2019 0519   RDW 14.1 09/04/2019 0519   LYMPHSABS 0.9 09/04/2019 0519   MONOABS 0.5 09/04/2019 0519   EOSABS 0.1 09/04/2019 0519   BASOSABS 0.0 09/04/2019 0519     CMP     Component Value Date/Time   NA 135 09/06/2019 0549   K 3.9 09/06/2019 0549   CL 106 09/06/2019 0549   CO2 22 09/06/2019 0549   GLUCOSE 110 (H) 09/06/2019 0549   BUN 10 09/06/2019 0549   CREATININE 0.87 09/06/2019 0549   CREATININE 1.25 (H) 08/22/2019 1350   CALCIUM 9.4 09/06/2019 0549   CALCIUM 10.7 (H) 09/04/2019 0519   PROT 5.6 (L) 09/04/2019 0519   ALBUMIN 3.1 (L) 09/04/2019 0519   AST 27 09/04/2019 0519   AST 20 08/22/2019 1350   ALT 37 09/04/2019 0519   ALT 17 08/22/2019 1350   ALKPHOS 55 09/04/2019 0519   BILITOT 0.7 09/04/2019 0519   BILITOT 1.0 08/22/2019 1350   GFRNONAA >60 09/06/2019 0549   GFRNONAA 57 (L) 08/22/2019 1350   GFRAA >60 09/06/2019 0549   GFRAA >60 08/22/2019 1350    CBG (last 3)  Recent Labs    09/05/19 2018 09/06/19 0634 09/06/19 0742  GLUCAP 103* 110* 98     GFR: Estimated Creatinine Clearance: 89.2 mL/min (by C-G formula based on SCr of 0.87 mg/dL).  Estimated Creatinine Clearance: 89.2 mL/min (by C-G formula based on SCr of 0.87 mg/dL).  Liver Function Tests: Recent Labs  Lab 08/31/19 0539 09/01/19 0543 09/02/19 0457 09/03/19 0553 09/04/19 0519  AST 34 38 31 27 27   ALT 47* 49* 45* 40 37  ALKPHOS 51 54 53 54 55  BILITOT 0.8 0.8 1.2 0.8 0.7  PROT 5.5* 5.5* 5.4* 5.6* 5.6*  ALBUMIN 2.9* 3.0* 3.0* 3.0* 3.1*   No results for input(s): LIPASE, AMYLASE in the last 168 hours. No results for input(s): AMMONIA in the last 168 hours.  Coagulation Profile: No results for input(s): INR, PROTIME in the last 168 hours.  No results found for this or any previous visit (from the past 240 hour(s)).       Radiology Studies: No results found.      Scheduled Meds: . buPROPion  150 mg Oral Daily  . Droxidopa  100 mg Oral TID  . enoxaparin (LOVENOX) injection  40 mg Subcutaneous Q24H  . escitalopram  20 mg Oral Daily  . feeding supplement ([employer] STANDARD 1.4)  325 mL Oral BID BM  . feeding supplement ([employer] STANDARD 1.4)  325 mL Per Tube Q24H  . fludrocortisone  0.2 mg Oral Daily  . iron polysaccharides  150  mg Oral Daily  . midodrine  10 mg Oral TID WC  . pantoprazole sodium  40 mg Oral BID  . saccharomyces boulardii  250 mg Oral BID  . sucralfate  1 g Oral TID WC & HS  . tamsulosin  0.4 mg Oral QHS  . vitamin B-12  1,000 mcg Oral Daily   Continuous Infusions:    LOS: 11 days     Cordelia Poche, MD [facility] 09/06/2019, 1:39 PM  If 7PM-7AM, please contact night-coverage [email or web address]

## 2019-09-06 NOTE — Progress Notes (Signed)
Per Dr. Benay Spice sent scheduling message for lab/fu and Keytruda (2 hour infusion) for either Monday 6/7 or Tuesday 6/8.

## 2019-09-06 NOTE — TOC Progression Note (Addendum)
Transition of Care [facility]) - Progression Note    Patient Details  Name: David [name] MRN: [id number] Date of Birth: Jun 01, 1945  Transition of Care [facility]) CM/SW [place], [place] Phone Number: 09/06/2019, 4:02 PM  Clinical Narrative:   Darrol Angel, who stated if patient is not d/cing today, will need to resubmit for auth.  Auth request resubmitted with request of start date tomorrow.  TOC will continue to follow during the course of hospitalization.  Addendum:  Attempted to reach [name] at [facility] to give update and ask about weekend admission.  No answer.  Left message.  [phone]     Expected Discharge Plan: Skilled Nursing Facility Barriers to Discharge: Other (comment)(SNF pending wife's decision)  Expected Discharge Plan and Services Expected Discharge Plan: [place]                                               Social Determinants of Health (SDOH) Interventions    Readmission Risk Interventions No flowsheet data found.

## 2019-09-06 NOTE — Telephone Encounter (Signed)
Scheduled appt per 5/28 sch message- unable to reach pt wife. Left message with appt date and time

## 2019-09-07 DIAGNOSIS — G903 Multi-system degeneration of the autonomic nervous system: Secondary | ICD-10-CM

## 2019-09-07 LAB — BASIC METABOLIC PANEL
Anion gap: 9 (ref 5–15)
BUN: 13 mg/dL (ref 8–23)
CO2: 19 mmol/L — ABNORMAL LOW (ref 22–32)
Calcium: 9.1 mg/dL (ref 8.9–10.3)
Chloride: 103 mmol/L (ref 98–111)
Creatinine, Ser: 0.8 mg/dL (ref 0.61–1.24)
GFR calc Af Amer: >60 mL/min (ref >60)
GFR calc non Af Amer: >60 mL/min (ref >60)
Glucose, Bld: 136 mg/dL — ABNORMAL HIGH (ref 70–99)
Potassium: 3 mmol/L — ABNORMAL LOW (ref 3.5–5.1)
Sodium: 131 mmol/L — ABNORMAL LOW (ref 135–145)

## 2019-09-07 LAB — GLUCOSE, CAPILLARY
Glucose-Capillary: 108 mg/dL — ABNORMAL HIGH (ref 70–99)
Glucose-Capillary: 109 mg/dL — ABNORMAL HIGH (ref 70–99)
Glucose-Capillary: 148 mg/dL — ABNORMAL HIGH (ref 70–99)
Glucose-Capillary: 164 mg/dL — ABNORMAL HIGH (ref 70–99)
Glucose-Capillary: 85 mg/dL (ref 70–99)
Glucose-Capillary: 97 mg/dL (ref 70–99)

## 2019-09-07 MED ORDER — POTASSIUM CHLORIDE CRYS ER 20 MEQ PO TBCR: 40.0000 meq | EXTENDED_RELEASE_TABLET | ORAL | Status: DC

## 2019-09-07 MED ORDER — POTASSIUM CHLORIDE 20 MEQ/15ML (10%) PO SOLN
40.0000 meq | ORAL | Status: AC
  Administered 2019-09-07 (×2): 40 meq via ORAL
  Filled 2019-09-07 (×2): qty 30

## 2019-09-07 NOTE — Progress Notes (Signed)
Physical Therapy Treatment Patient Details Name: David [name] MRN: [id number] DOB: [birth year] Today's Date: 09/07/2019    History of Present Illness 74 y.o. male with medical history significant of esophageal cancer on palliative chemotherapy, hypertension, dementia, hyperlipidemia, BPH and anxiety disorder and admitted with AMS/fever, found to have UTI and possible PNA .  Pt with recent admission for presyncope likely secondary to orthostasis.    PT Comments    Pt agreeable to orthostatics. Please see vital section for specifics, but biggest drop was from supine to sitting. He c/o minimal dizziness and appeared to have more anxiety regarding standing. He was very fatigued from standing and fell asleep almost immediately upon returning supine.  Recommend SNF.   Follow Up Recommendations  SNF     Equipment Recommendations  Other (comment)(defer to next venue of care)    Recommendations for Other Services       Precautions / Restrictions Precautions Precautions: Fall Precaution Comments: G tube, orthostatic,  TEDS + ABD Binder Restrictions Weight Bearing Restrictions: No    Mobility  Bed Mobility Overal bed mobility: Needs Assistance Bed Mobility: Rolling;Sidelying to Sit;Sit to Supine Rolling: Min guard Sidelying to sit: Min assist   Sit to supine: Min assist   General bed mobility comments: A for LE and needs frequent verbal cues  Transfers Overall transfer level: Needs assistance Equipment used: Rolling walker (2 wheeled) Transfers: Sit to/from Stand Sit to Stand: Min assist         General transfer comment: MIN A to power up. Pt very anxious while standing. Orthostatics taken. C/o minimal dizziness  Ambulation/Gait                 Stairs             Wheelchair Mobility    Modified Rankin (Stroke Patients Only)       Balance Overall balance assessment: Needs assistance Sitting-balance support: Feet supported Sitting balance-Leahy  Scale: Good     Standing balance support: Bilateral upper extremity supported Standing balance-Leahy Scale: Fair                              Cognition Arousal/Alertness: Awake/alert Behavior During Therapy: WFL for tasks assessed/performed Overall Cognitive Status: History of cognitive impairments - at baseline                                        Exercises      General Comments General comments (skin integrity, edema, etc.): Pt extremely fatigued from standing      Pertinent Vitals/Pain Pain Assessment: No/denies pain    Home Living                      Prior Function            PT Goals (current goals can now be found in the care plan section) Progress towards PT goals: Progressing toward goals    Frequency    Min 3X/week      PT Plan Current plan remains appropriate    Co-evaluation              AM-PAC PT "6 Clicks" Mobility   Outcome Measure    Help needed moving from lying on your back to sitting on the side of a flat bed without using bedrails?: A Little Help needed moving  to and from a bed to a chair (including a wheelchair)?: A Little Help needed standing up from a chair using your arms (e.g., wheelchair or bedside chair)?: A Little Help needed to walk in hospital room?: A Lot Help needed climbing 3-5 steps with a railing? : A Lot 6 Click Score: 13    End of Session Equipment Utilized During Treatment: Gait belt Activity Tolerance: Patient limited by fatigue Patient left: in bed;with call bell/phone within reach;with bed alarm set Nurse Communication: Mobility status PT Visit Diagnosis: Difficulty in walking, not elsewhere classified (R26.2)     Time: 1320-1350 PT Time Calculation (min) (ACUTE ONLY): 30 min  Charges:  $Therapeutic Activity: 23-37 mins                     David [name], [place] Pager [phone] 09/07/2019    Galen Manila 09/07/2019, 2:03 PM

## 2019-09-07 NOTE — TOC Progression Note (Signed)
Transition of Care [facility]) - Progression Note    Patient Details  Name: Currie Dennin Leggette MRN: [id number] Date of Birth: October 23, 1945  Transition of Care [facility]) CM/SW Contact  [address], [place], [place] Phone Number: 09/07/2019, 5:04 PM  Clinical Narrative:    Phone call to [facility] [phone] to check auth request. Authorization still pending at this time. RN notified.   [address], LCSW Transitions of Care [phone]    Expected Discharge Plan: Skilled Nursing Facility Barriers to Discharge: Other (comment)(SNF pending wife's decision)  Expected Discharge Plan and Services Expected Discharge Plan: [place]                                               Social Determinants of Health (SDOH) Interventions    Readmission Risk Interventions No flowsheet data found.

## 2019-09-07 NOTE — Progress Notes (Signed)
Orthostatic vitals signs completed and recorded by physical therapy. Attending physician notified for review. Per attending physician request, CSW on staff contacted to determine status of placement post discharge.

## 2019-09-07 NOTE — Progress Notes (Signed)
PROGRESS NOTE    David [name]  QQV:[id number] DOB: February 28, 1946 DOA: 08/25/2019 PCP: Leighton Ruff, MD   Brief Narrative: David [name] is a 74 y.o. malewithhistory of esophageal cancer PEG tube placement, BPH, dementia, depression. Patient presented secondary to worsening confusion, nausea and vomiting and found to have evidence of pneumonia in addition to hypercalcemia.   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 Mental status appears to have improved and at baseline.  Pneumonia Chest x-ray concerning for infiltrates. Patient treated with Ceftriaxone and azithromycin. Completed course.  Hyponatremia Mild. Stable. Nutrition and fluids.  Hypercalcemia Given Zometa on 5/25. Calcium improved. Oncology on board. Calcium stable.  Nausea/vomiting PEG tube in place. EGD significant for esophagitis. Improved. -Regular diet  Orthostatic hypotension Cardiology consulted. Patient is on Florinef, midodrine and Droxidopa. Orthostatic vitals improved. -Continue PT -Continue orthostatic vitals  Hypokalemia -Potassium supplementation today  Depression -Continue Wellbutrin and Lexapro  Thrombocytopenia Stable.  Esophageal cancer Patient is followed by medical oncology. No mass seen on recent EGD. Patient is s/p radiation. PEG tube placed secondary to issues with oral intake secondary to mass.  Urinary frequency BPH Patient is on Tamsulosin  Dementia Patient likely baseline per chart review. Patient states he feels confused. -Schedule voiding  Nephrolithiasis Non-obstructing.  Goals of care Patient is now DNR.  Hospice referral was made for patient for possible home hospice.   DVT prophylaxis: Lovenox Code Status:   Code Status: DNR Family Communication: None at bedside. Wife on telephone Disposition Plan: Discharge to SNF when bed available.   Consultants:   Medical oncology  Procedures:   EGD (08/31/2019) Impression:               - Esophagitis. Biopsied. NO OBVIOUS ESOPHAGEAL MASS                           - No gross lesions in the stomach.                           - Normal examined duodenum.  Recommendation:           - Advance diet as tolerated.                           - Continue present medications.  Antimicrobials:  Ceftriaxone  Azithromycin    Subjective: No issues  Objective: Vitals:   09/06/19 0619 09/06/19 1349 09/06/19 2124 09/07/19 0428  BP: (!) 142/96 (!) 145/83 (!) 146/83 136/80  Pulse: 82 95 86 94  Resp: 18 (!) 24 17 18   Temp: 97.6 F (36.4 C) 98.6 F (37 C) 98.8 F (37.1 C) 98.2 F (36.8 C)  TempSrc: Oral Oral Oral Oral  SpO2: 100% 99% 100% 98%  Weight:      Height:        Intake/Output Summary (Last 24 hours) at 09/07/2019 0923 Last data filed at 09/07/2019 0600 Gross per 24 hour  Intake 472 ml  Output 2801 ml  Net -2329 ml   Filed Weights   09/03/19 0500 09/04/19 0500 09/06/19 0500  Weight: 100.2  kg 98 kg 98.4 kg    Examination:  General exam: Appears calm and comfortable Respiratory system: Clear to auscultation. Respiratory effort normal. Cardiovascular system: S1 & S2 heard, RRR. No murmurs, rubs, gallops or clicks. Gastrointestinal system: Abdomen is nondistended, soft and nontender. No organomegaly or masses felt. Normal bowel sounds heard. Central nervous system: Alert and oriented. Musculoskeletal: No edema. No calf tenderness Skin: No cyanosis. No rashes. Red confluent rash noted around perineum     Data Reviewed: I have personally reviewed following labs and imaging studies  CBC    Component Value Date/Time   WBC 8.0  09/04/2019 0519   RBC 4.13 (L) 09/04/2019 0519   HGB 12.1 (L) 09/04/2019 0519   HGB 14.2 08/22/2019 1350   HCT 36.4 (L) 09/04/2019 0519   PLT 151 09/04/2019 0519   PLT 132 (L) 08/22/2019 1350   MCV 88.1 09/04/2019 0519   MCH 29.3 09/04/2019 0519   MCHC 33.2 09/04/2019 0519   RDW 14.1 09/04/2019 0519   LYMPHSABS 0.9 09/04/2019 0519   MONOABS 0.5 09/04/2019 0519   EOSABS 0.1 09/04/2019 0519   BASOSABS 0.0 09/04/2019 0519     CMP     Component Value Date/Time   NA 131 (L) 09/07/2019 0543   K 3.0 (L) 09/07/2019 0543   CL 103 09/07/2019 0543   CO2 19 (L) 09/07/2019 0543   GLUCOSE 136 (H) 09/07/2019 0543   BUN 13 09/07/2019 0543   CREATININE 0.80 09/07/2019 0543   CREATININE 1.25 (H) 08/22/2019 1350   CALCIUM 9.1 09/07/2019 0543   CALCIUM 10.7 (H) 09/04/2019 0519   PROT 5.6 (L) 09/04/2019 0519   ALBUMIN 3.1 (L) 09/04/2019 0519   AST 27 09/04/2019 0519   AST 20 08/22/2019 1350   ALT 37 09/04/2019 0519   ALT 17 08/22/2019 1350   ALKPHOS 55 09/04/2019 0519   BILITOT 0.7 09/04/2019 0519   BILITOT 1.0 08/22/2019 1350   GFRNONAA >60 09/07/2019 0543   GFRNONAA 57 (L) 08/22/2019 1350   GFRAA >60 09/07/2019 0543   GFRAA >60 08/22/2019 1350    CBG (last 3)  Recent Labs    09/07/19 0032 09/07/19 0425 09/07/19 0737  GLUCAP 164* 148* 97     GFR: Estimated Creatinine Clearance: 97 mL/min (by C-G formula based on SCr of 0.8 mg/dL).  Estimated Creatinine Clearance: 97 mL/min (by C-G formula based on SCr of 0.8 mg/dL).  Liver Function Tests: Recent Labs  Lab 09/01/19 0543 09/02/19 0457 09/03/19 0553 09/04/19 0519  AST 38 31 27 27   ALT 49* 45* 40 37  ALKPHOS 54 53 54 55  BILITOT 0.8 1.2 0.8 0.7  PROT 5.5* 5.4* 5.6* 5.6*  ALBUMIN 3.0* 3.0* 3.0* 3.1*   No results for input(s): LIPASE, AMYLASE in the last 168 hours. No results for input(s): AMMONIA in the last 168 hours.  Coagulation Profile: No results for input(s): INR, PROTIME in the last 168 hours.  No results  found for this or any previous visit (from the past 240 hour(s)).      Radiology Studies: No results found.      Scheduled Meds: . buPROPion  150 mg Oral Daily  . Droxidopa  100 mg Oral TID  . enoxaparin (LOVENOX) injection  40 mg Subcutaneous Q24H  . escitalopram  20 mg Oral Daily  . feeding supplement (KATE FARMS STANDARD 1.4)  325 mL Oral BID BM  . feeding supplement (KATE FARMS STANDARD 1.4)  325 mL Per Tube Q24H  . fludrocortisone  0.2 mg  Oral Daily  . iron polysaccharides  150 mg Oral Daily  . midodrine  10 mg Oral TID WC  . pantoprazole sodium  40 mg Oral BID  . potassium chloride  40 mEq Oral Q4H  . saccharomyces boulardii  250 mg Oral BID  . sucralfate  1 g Oral TID WC & HS  . tamsulosin  0.4 mg Oral QHS  . vitamin B-12  1,000 mcg Oral Daily   Continuous Infusions:    LOS: 12 days     Cordelia Poche, MD [facility] 09/07/2019, 9:23 AM  If 7PM-7AM, please contact night-coverage [email or web address]

## 2019-09-07 NOTE — Progress Notes (Signed)
   Per chart patient refusing orthostatic BP checks. On maximal therapy with midodrine, florinef and droxidopa. If he remains orthostatic, can incrementally increase droxidopa to up to 600 mg TID (max dose). Cardiology will be available as needed.  [facility] will sign off.   Medication Recommendations:  Continue current meds Other recommendations (labs, testing, etc):  none Follow up as an outpatient:  Dr. Kerby Less, MD, [facility], [place] Director of the [facility] Lipid [facility] &  Cardiovascular [facility] Clinic Diplomate of the American Board of Clinical Lipidology Attending Cardiologist  Direct Dial: [phone]  Fax: [phone]  Website:  [email or web address]

## 2019-09-08 LAB — BASIC METABOLIC PANEL
Anion gap: 10 (ref 5–15)
BUN: 10 mg/dL (ref 8–23)
CO2: 21 mmol/L — ABNORMAL LOW (ref 22–32)
Calcium: 9 mg/dL (ref 8.9–10.3)
Chloride: 105 mmol/L (ref 98–111)
Creatinine, Ser: 0.9 mg/dL (ref 0.61–1.24)
GFR calc Af Amer: >60 mL/min (ref >60)
GFR calc non Af Amer: >60 mL/min (ref >60)
Glucose, Bld: 140 mg/dL — ABNORMAL HIGH (ref 70–99)
Potassium: 3.4 mmol/L — ABNORMAL LOW (ref 3.5–5.1)
Sodium: 136 mmol/L (ref 135–145)

## 2019-09-08 LAB — GLUCOSE, CAPILLARY
Glucose-Capillary: 103 mg/dL — ABNORMAL HIGH (ref 70–99)
Glucose-Capillary: 104 mg/dL — ABNORMAL HIGH (ref 70–99)
Glucose-Capillary: 133 mg/dL — ABNORMAL HIGH (ref 70–99)
Glucose-Capillary: 136 mg/dL — ABNORMAL HIGH (ref 70–99)
Glucose-Capillary: 149 mg/dL — ABNORMAL HIGH (ref 70–99)

## 2019-09-08 MED ORDER — POTASSIUM CHLORIDE CRYS ER 20 MEQ PO TBCR
40.0000 meq | EXTENDED_RELEASE_TABLET | Freq: Once | ORAL | Status: AC
  Administered 2019-09-08: 40 meq via ORAL
  Filled 2019-09-08: qty 2

## 2019-09-08 MED ORDER — LORAZEPAM 0.5 MG PO TABS: 0.5000 mg | ORAL_TABLET | Freq: Four times a day (QID) | ORAL | 0 refills | Status: DC | PRN

## 2019-09-08 NOTE — TOC Progression Note (Signed)
Transition of Care [facility]) - Progression Note    Patient Details  Name: David [name] MRN: [id number] Date of Birth: 1945-08-19  Transition of Care [facility]) CM/SW [place], [employer] Phone Number: 09/08/2019, 9:00 AM  Clinical Narrative:   Contacted insurance.  Authorization still pending. TOC will continue to follow during the course of hospitalization.     Expected Discharge Plan: Skilled Nursing Facility Barriers to Discharge: Other (comment)(SNF pending wife's decision)  Expected Discharge Plan and Services Expected Discharge Plan: [place]                                               Social Determinants of Health (SDOH) Interventions    Readmission Risk Interventions No flowsheet data found.

## 2019-09-08 NOTE — Progress Notes (Signed)
PROGRESS NOTE    David [name]  [id number]:[id number] DOB: 15-Jul-1945 DOA: 08/25/2019 PCP: Leighton Ruff, MD   Brief Narrative: David [name] is a 73 y.o. malewithhistory of esophageal cancer PEG tube placement, BPH, dementia, depression. Patient presented secondary to worsening confusion, nausea and vomiting and found to have evidence of pneumonia in addition to hypercalcemia.   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 Mental status appears to have improved and at baseline.  Pneumonia Chest x-ray concerning for infiltrates. Patient treated with Ceftriaxone and azithromycin. Completed course.  Hyponatremia Mild. Stable. Nutrition and fluids.  Hypercalcemia Given Zometa on 5/25. Calcium improved. Oncology on board. Calcium stable.  Nausea/vomiting PEG tube in place. EGD significant for esophagitis. Improved. -Regular diet  Orthostatic hypotension Cardiology consulted. Patient is on Florinef, midodrine and Droxidopa. Orthostatic vitals improved but still present. Symptoms appear to have been relieved, however. -Continue PT -Continue orthostatic vitals  Hypokalemia -Potassium supplementation today  Depression -Continue Wellbutrin and Lexapro  Thrombocytopenia Stable.  Esophageal cancer Patient is followed by medical oncology. No mass seen on recent EGD. Patient is s/p radiation. PEG tube placed secondary to issues with oral intake secondary to mass.  Urinary frequency BPH Patient is on Tamsulosin  Dementia Patient likely baseline per chart review. Patient states he feels confused. -Schedule  voiding  Nephrolithiasis Non-obstructing.  Goals of care Patient is now DNR. Hospice referral was made for patient for possible home hospice.   DVT prophylaxis: Lovenox Code Status:   Code Status: DNR Family Communication: Wife on telephone Disposition Plan: Discharge to SNF when bed available.   Consultants:   Medical oncology  Procedures:   EGD (08/31/2019) Impression:               - Esophagitis. Biopsied. NO OBVIOUS ESOPHAGEAL MASS                           - No gross lesions in the stomach.                           - Normal examined duodenum.  Recommendation:           - Advance diet as tolerated.                           - Continue present medications.  Antimicrobials:  Ceftriaxone  Azithromycin    Subjective: Patient states he fell down last night. He is anxious that he is still in the hospital. Per discussion with nursing, no evidence of patient fall overnight.  Objective: Vitals:   09/07/19 1957 09/07/19 1959 09/07/19 2300 09/08/19 0604  BP: (!) 144/82 98/71  (!) 144/78  Pulse: 89 95  86  Resp:    18  Temp: 98.3 F (36.8 C) 97.8 F (36.6 C)  (!) 97.5 F (36.4 C)  TempSrc: Oral Oral  Oral  SpO2: 100% 100% 100% 99%  Weight:      Height:        Intake/Output Summary (Last 24 hours) at 09/08/2019 1026 Last data filed at 09/08/2019 0600 Gross per 24  hour  Intake --  Output 1200 ml  Net -1200 ml   Filed Weights   09/03/19 0500 09/04/19 0500 09/06/19 0500  Weight: 100.2 kg 98 kg 98.4 kg    Examination:  General exam: Appears calm and comfortable Respiratory system: Clear to auscultation. Respiratory effort normal. Cardiovascular system: S1 & S2 heard, RRR. No murmurs, rubs, gallops or clicks. Gastrointestinal system: Abdomen is nondistended, soft and nontender. No organomegaly or masses felt. Normal bowel sounds heard. Central nervous system: Alert Musculoskeletal: No edema. No calf tenderness Skin: No cyanosis.    Data Reviewed: I have  personally reviewed following labs and imaging studies  CBC    Component Value Date/Time   WBC 8.0 09/04/2019 0519   RBC 4.13 (L) 09/04/2019 0519   HGB 12.1 (L) 09/04/2019 0519   HGB 14.2 08/22/2019 1350   HCT 36.4 (L) 09/04/2019 0519   PLT 151 09/04/2019 0519   PLT 132 (L) 08/22/2019 1350   MCV 88.1 09/04/2019 0519   MCH 29.3 09/04/2019 0519   MCHC 33.2 09/04/2019 0519   RDW 14.1 09/04/2019 0519   LYMPHSABS 0.9 09/04/2019 0519   MONOABS 0.5 09/04/2019 0519   EOSABS 0.1 09/04/2019 0519   BASOSABS 0.0 09/04/2019 0519     CMP     Component Value Date/Time   NA 136 09/08/2019 0557   K 3.4 (L) 09/08/2019 0557   CL 105 09/08/2019 0557   CO2 21 (L) 09/08/2019 0557   GLUCOSE 140 (H) 09/08/2019 0557   BUN 10 09/08/2019 0557   CREATININE 0.90 09/08/2019 0557   CREATININE 1.25 (H) 08/22/2019 1350   CALCIUM 9.0 09/08/2019 0557   CALCIUM 10.7 (H) 09/04/2019 0519   PROT 5.6 (L) 09/04/2019 0519   ALBUMIN 3.1 (L) 09/04/2019 0519   AST 27 09/04/2019 0519   AST 20 08/22/2019 1350   ALT 37 09/04/2019 0519   ALT 17 08/22/2019 1350   ALKPHOS 55 09/04/2019 0519   BILITOT 0.7 09/04/2019 0519   BILITOT 1.0 08/22/2019 1350   GFRNONAA >60 09/08/2019 0557   GFRNONAA 57 (L) 08/22/2019 1350   GFRAA >60 09/08/2019 0557   GFRAA >60 08/22/2019 1350    CBG (last 3)  Recent Labs    09/07/19 2105 09/08/19 0005 09/08/19 0748  GLUCAP 85 133* 104*     GFR: Estimated Creatinine Clearance: 86.3 mL/min (by C-G formula based on SCr of 0.9 mg/dL).  Estimated Creatinine Clearance: 86.3 mL/min (by C-G formula based on SCr of 0.9 mg/dL).  Liver Function Tests: Recent Labs  Lab 09/02/19 0457 09/03/19 0553 09/04/19 0519  AST 31 27 27   ALT 45* 40 37  ALKPHOS 53 54 55  BILITOT 1.2 0.8 0.7  PROT 5.4* 5.6* 5.6*  ALBUMIN 3.0* 3.0* 3.1*   No results for input(s): LIPASE, AMYLASE in the last 168 hours. No results for input(s): AMMONIA in the last 168 hours.  Coagulation Profile: No  results for input(s): INR, PROTIME in the last 168 hours.  No results found for this or any previous visit (from the past 240 hour(s)).      Radiology Studies: No results found.      Scheduled Meds: . buPROPion  150 mg Oral Daily  . Droxidopa  100 mg Oral TID  . enoxaparin (LOVENOX) injection  40 mg Subcutaneous Q24H  . escitalopram  20 mg Oral Daily  . feeding supplement (KATE FARMS STANDARD 1.4)  325 mL Oral BID BM  . feeding supplement (KATE FARMS STANDARD 1.4)  325 mL Per Tube  Q24H  . fludrocortisone  0.2 mg Oral Daily  . iron polysaccharides  150 mg Oral Daily  . midodrine  10 mg Oral TID WC  . pantoprazole sodium  40 mg Oral BID  . saccharomyces boulardii  250 mg Oral BID  . sucralfate  1 g Oral TID WC & HS  . tamsulosin  0.4 mg Oral QHS  . vitamin B-12  1,000 mcg Oral Daily   Continuous Infusions:    LOS: 13 days     Cordelia Poche, MD [facility] 09/08/2019, 10:26 AM  If 7PM-7AM, please contact night-coverage [email or web address]

## 2019-09-09 LAB — BASIC METABOLIC PANEL
Anion gap: 8 (ref 5–15)
BUN: 11 mg/dL (ref 8–23)
CO2: 23 mmol/L (ref 22–32)
Calcium: 8.9 mg/dL (ref 8.9–10.3)
Chloride: 106 mmol/L (ref 98–111)
Creatinine, Ser: 0.97 mg/dL (ref 0.61–1.24)
GFR calc Af Amer: >60 mL/min (ref >60)
GFR calc non Af Amer: >60 mL/min (ref >60)
Glucose, Bld: 119 mg/dL — ABNORMAL HIGH (ref 70–99)
Potassium: 3.6 mmol/L (ref 3.5–5.1)
Sodium: 137 mmol/L (ref 135–145)

## 2019-09-09 LAB — GLUCOSE, CAPILLARY
Glucose-Capillary: 104 mg/dL — ABNORMAL HIGH (ref 70–99)
Glucose-Capillary: 114 mg/dL — ABNORMAL HIGH (ref 70–99)
Glucose-Capillary: 120 mg/dL — ABNORMAL HIGH (ref 70–99)
Glucose-Capillary: 139 mg/dL — ABNORMAL HIGH (ref 70–99)
Glucose-Capillary: 77 mg/dL (ref 70–99)
Glucose-Capillary: 94 mg/dL (ref 70–99)

## 2019-09-09 LAB — PTH-RELATED PEPTIDE: PTH-related peptide: <2 pmol/L

## 2019-09-09 NOTE — Progress Notes (Signed)
Physical Therapy Treatment Patient Details Name: David [name] Albert MRN: [id number] DOB: [birth year] Today's Date: 09/09/2019    History of Present Illness 74 y.o. male with medical history significant of esophageal cancer on palliative chemotherapy, hypertension, dementia, hyperlipidemia, BPH and anxiety disorder and admitted with AMS/fever, found to have UTI and possible PNA .  Pt with recent admission for presyncope likely secondary to orthostasis.    PT Comments    Pt reporting back pain and agreeable to change positions to sit in recliner.  Pt then requested to use BSC to assisted to/from [facility].  Pt with episode of large amount of emesis once in recliner and nurse tech in to room to assist.    Follow Up Recommendations  SNF     Equipment Recommendations  Wheelchair (measurements PT);Wheelchair cushion (measurements PT)    Recommendations for Other Services       Precautions / Restrictions Precautions Precautions: Fall Precaution Comments: G tube, orthostatic,  TEDS + ABD Binder (no abdominal binder in room 09/09/19)    Mobility  Bed Mobility Overal bed mobility: Needs Assistance Bed Mobility: Supine to Sit     Supine to sit: Supervision     General bed mobility comments: supervision for safety; pt sat 2 minutes prior to transfer  Transfers Overall transfer level: Needs assistance Equipment used: Rolling walker (2 wheeled) Transfers: Sit to/from Stand Sit to Stand: Min assist Stand pivot transfers: Min assist       General transfer comment: assist to stand, max cues for technique, pt assisted to recliner and then requested to use BSC, transferred to/from [facility] as well (pt had loose BM and then emesis upon return to recliner)  Nurse tech assisted with bathing; pt does report dizziness with mobility (wearing TED hose however no abdominal brace in room)  Ambulation/Gait                 Stairs             Wheelchair Mobility    Modified Rankin (Stroke  Patients Only)       Balance Overall balance assessment: History of Falls                                          Cognition Arousal/Alertness: Awake/alert Behavior During Therapy: WFL for tasks assessed/performed Overall Cognitive Status: History of cognitive impairments - at baseline                                        Exercises      General Comments        Pertinent Vitals/Pain Pain Assessment: No/denies pain    Home Living                      Prior Function            PT Goals (current goals can now be found in the care plan section) Progress towards PT goals: Progressing toward goals    Frequency    Min 3X/week      PT Plan Current plan remains appropriate    Co-evaluation              AM-PAC PT "6 Clicks" Mobility   Outcome Measure  Help needed turning from your back to your side while in  a flat bed without using bedrails?: A Little Help needed moving from lying on your back to sitting on the side of a flat bed without using bedrails?: A Little Help needed moving to and from a bed to a chair (including a wheelchair)?: A Little Help needed standing up from a chair using your arms (e.g., wheelchair or bedside chair)?: A Little Help needed to walk in hospital room?: A Lot Help needed climbing 3-5 steps with a railing? : A Lot 6 Click Score: 16    End of Session Equipment Utilized During Treatment: Gait belt Activity Tolerance: Patient limited by fatigue Patient left: with call bell/phone within reach;in chair;with chair alarm set;with nursing/sitter in room   PT Visit Diagnosis: Difficulty in walking, not elsewhere classified (R26.2)     Time: 7955-8316 PT Time Calculation (min) (ACUTE ONLY): 26 min  Charges:  $Therapeutic Activity: 23-37 mins                    Jannette Spanner PT, DPT Acute Rehabilitation Services Office: [phone]  York Ram E 09/09/2019, 1:14 PM

## 2019-09-09 NOTE — Progress Notes (Signed)
PROGRESS NOTE    David [name]  JJH:[id number] DOB: [birth year] DOA: 08/25/2019 PCP: Leighton Ruff, MD   Brief Narrative: David [name] is a 74 y.o. malewithhistory of esophageal cancer PEG tube placement, BPH, dementia, depression. Patient presented secondary to worsening confusion, nausea and vomiting and found to have evidence of pneumonia in addition to hypercalcemia.   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 Mental status appears to have improved and at baseline.  Pneumonia Chest x-ray concerning for infiltrates. Patient treated with Ceftriaxone and azithromycin. Completed course.  Hyponatremia Mild. Stable. Nutrition and fluids.  Hypercalcemia Given Zometa on 5/25. Calcium improved. Oncology on board. Calcium stable.  Nausea/vomiting PEG tube in place. EGD significant for esophagitis. Improved. -Regular diet  Orthostatic hypotension Cardiology consulted. Patient is on Florinef, midodrine and Droxidopa. Orthostatic vitals improved with improvement in symptoms -Continue PT -Continue orthostatic vitals  Hypokalemia -Potassium supplementation today  Depression -Continue Wellbutrin and Lexapro  Thrombocytopenia Stable.  Esophageal cancer Patient is followed by medical oncology. No mass seen on recent EGD. Patient is s/p radiation. PEG tube placed secondary to issues with oral intake secondary to mass.  Urinary frequency BPH Patient is on Tamsulosin  Dementia Patient likely baseline per chart review. Patient states he feels confused. -Schedule voiding  Nephrolithiasis Non-obstructing.  Goals  of care Patient is now DNR. Hospice referral was made for patient for possible home hospice.   DVT prophylaxis: Lovenox Code Status:   Code Status: DNR Family Communication: None at bedside Disposition Plan: Discharge to SNF when bed available and pending insurance authorization   Consultants:   Medical oncology  Procedures:   EGD (08/31/2019) Impression:               - Esophagitis. Biopsied. NO OBVIOUS ESOPHAGEAL MASS                           - No gross lesions in the stomach.                           - Normal examined duodenum.  Recommendation:           - Advance diet as tolerated.                           - Continue present medications.  Antimicrobials:  Ceftriaxone  Azithromycin    Subjective: No issues overnight  Objective: Vitals:   09/08/19 1332 09/08/19 2038 09/09/19 0455 09/09/19 0457  BP: 135/79 135/81 (!) 143/87   Pulse: 79 83 90   Resp: 18 18 18    Temp: 98 F (36.7 C) 98.8 F (37.1 C) 98.4 F (36.9 C)   TempSrc: Oral Oral Oral   SpO2: 100% 100% 100%   Weight:    107 kg  Height:        Intake/Output Summary (Last 24 hours) at 09/09/2019 1120 Last data filed at 09/09/2019 1028 Gross per 24 hour  Intake 1180 ml  Output 275 ml  Net 905 ml   Filed Weights   09/04/19 0500 09/06/19 0500 09/09/19 0457  Weight:  98 kg 98.4 kg 107 kg    Examination:  General: Well appearing, no distress   Data Reviewed: I have personally reviewed following labs and imaging studies  CBC    Component Value Date/Time   WBC 8.0 09/04/2019 0519   RBC 4.13 (L) 09/04/2019 0519   HGB 12.1 (L) 09/04/2019 0519   HGB 14.2 08/22/2019 1350   HCT 36.4 (L) 09/04/2019 0519   PLT 151 09/04/2019 0519   PLT 132 (L) 08/22/2019 1350   MCV 88.1 09/04/2019 0519   MCH 29.3 09/04/2019 0519   MCHC 33.2 09/04/2019 0519   RDW 14.1 09/04/2019 0519   LYMPHSABS 0.9 09/04/2019 0519   MONOABS 0.5 09/04/2019 0519   EOSABS 0.1 09/04/2019 0519   BASOSABS 0.0 09/04/2019 0519      CMP     Component Value Date/Time   NA 137 09/09/2019 0457   K 3.6 09/09/2019 0457   CL 106 09/09/2019 0457   CO2 23 09/09/2019 0457   GLUCOSE 119 (H) 09/09/2019 0457   BUN 11 09/09/2019 0457   CREATININE 0.97 09/09/2019 0457   CREATININE 1.25 (H) 08/22/2019 1350   CALCIUM 8.9 09/09/2019 0457   CALCIUM 10.7 (H) 09/04/2019 0519   PROT 5.6 (L) 09/04/2019 0519   ALBUMIN 3.1 (L) 09/04/2019 0519   AST 27 09/04/2019 0519   AST 20 08/22/2019 1350   ALT 37 09/04/2019 0519   ALT 17 08/22/2019 1350   ALKPHOS 55 09/04/2019 0519   BILITOT 0.7 09/04/2019 0519   BILITOT 1.0 08/22/2019 1350   GFRNONAA >60 09/09/2019 0457   GFRNONAA 57 (L) 08/22/2019 1350   GFRAA >60 09/09/2019 0457   GFRAA >60 08/22/2019 1350    CBG (last 3)  Recent Labs    09/09/19 0035 09/09/19 0453 09/09/19 0754  GLUCAP 120* 104* 114*     GFR: Estimated Creatinine Clearance: 89.7 mL/min (by C-G formula based on SCr of 0.97 mg/dL).  Estimated Creatinine Clearance: 89.7 mL/min (by C-G formula based on SCr of 0.97 mg/dL).  Liver Function Tests: Recent Labs  Lab 09/03/19 0553 09/04/19 0519  AST 27 27  ALT 40 37  ALKPHOS 54 55  BILITOT 0.8 0.7  PROT 5.6* 5.6*  ALBUMIN 3.0* 3.1*   No results for input(s): LIPASE, AMYLASE in the last 168 hours. No results for input(s): AMMONIA in the last 168 hours.  Coagulation Profile: No results for input(s): INR, PROTIME in the last 168 hours.  No results found for this or any previous visit (from the past 240 hour(s)).      Radiology Studies: No results found.      Scheduled Meds: . buPROPion  150 mg Oral Daily  . Droxidopa  100 mg Oral TID  . enoxaparin (LOVENOX) injection  40 mg Subcutaneous Q24H  . escitalopram  20 mg Oral Daily  . feeding supplement (KATE FARMS STANDARD 1.4)  325 mL Oral BID BM  . feeding supplement (KATE FARMS STANDARD 1.4)  325 mL Per Tube Q24H  . fludrocortisone  0.2 mg Oral Daily  . iron polysaccharides  150 mg Oral  Daily  . midodrine  10 mg Oral TID WC  . pantoprazole sodium  40 mg Oral BID  . saccharomyces boulardii  250 mg Oral BID  . sucralfate  1 g Oral TID WC & HS  . tamsulosin  0.4 mg Oral QHS  . vitamin B-12  1,000 mcg Oral Daily   Continuous Infusions:    LOS: 14 days     Cordelia Poche,  MD [facility] 09/09/2019, 11:20 AM  If 7PM-7AM, please contact night-coverage [email or web address]

## 2019-09-10 LAB — SARS CORONAVIRUS 2 BY RT PCR (HOSPITAL ORDER, PERFORMED IN ~~LOC~~ HOSPITAL LAB): SARS Coronavirus 2: NEGATIVE

## 2019-09-10 LAB — GLUCOSE, CAPILLARY
Glucose-Capillary: 103 mg/dL — ABNORMAL HIGH (ref 70–99)
Glucose-Capillary: 108 mg/dL — ABNORMAL HIGH (ref 70–99)
Glucose-Capillary: 123 mg/dL — ABNORMAL HIGH (ref 70–99)
Glucose-Capillary: 132 mg/dL — ABNORMAL HIGH (ref 70–99)

## 2019-09-10 MED ORDER — KATE FARMS STANDARD 1.4 PO LIQD: 325.0000 mL | ORAL | Status: DC

## 2019-09-10 MED ORDER — POLYSACCHARIDE IRON COMPLEX 150 MG PO CAPS: 150.0000 mg | ORAL_CAPSULE | Freq: Every day | ORAL | Status: DC

## 2019-09-10 MED ORDER — POTASSIUM CHLORIDE ER 10 MEQ PO TBCR: 20.0000 meq | EXTENDED_RELEASE_TABLET | Freq: Every day | ORAL | Status: DC

## 2019-09-10 MED ORDER — DROXIDOPA 100 MG PO CAPS: 100.0000 mg | ORAL_CAPSULE | Freq: Three times a day (TID) | ORAL | Status: DC

## 2019-09-10 MED ORDER — FLUDROCORTISONE ACETATE 0.1 MG PO TABS: 0.2000 mg | ORAL_TABLET | Freq: Every day | ORAL | Status: DC

## 2019-09-10 MED ORDER — MUSCLE RUB 10-15 % EX CREA
1.0000 "application " | TOPICAL_CREAM | CUTANEOUS | Status: DC | PRN
  Filled 2019-09-10: qty 85

## 2019-09-10 MED ORDER — SUCRALFATE 1 GM/10ML PO SUSP: 1.0000 g | Freq: Three times a day (TID) | ORAL | 0 refills | Status: DC

## 2019-09-10 MED ORDER — KATE FARMS STANDARD 1.4 PO LIQD: 325.0000 mL | Freq: Two times a day (BID) | ORAL | Status: DC

## 2019-09-10 NOTE — Discharge Instructions (Signed)
David [name],  You were in the hospital because pneumonia which was treated with antibiotics. You also had low blood pressure when standing for which you are on new medication. Please follow current medication recommendations on discharge and follow-up with your primary care physician.

## 2019-09-10 NOTE — Discharge Summary (Signed)
Physician Discharge Summary  Andren Bethea Burdin [id number]:[id number] DOB: [birth year] DOA: 08/25/2019  PCP: Leighton Ruff, MD  Admit date: 08/25/2019 Discharge date: 09/10/2019  Admitted From: Home Disposition: SNF  Recommendations for Outpatient Follow-up:  1. Follow up with PCP in 1 week 2. Please obtain BMP/CBC in one week 3. Please follow up on the following pending results: None  Home Health: SNF Equipment/Devices: Wheelchair  Discharge Condition: Stable CODE STATUS: DNR Diet recommendation: Tube feeds,    Brief/Interim Summary:  Admission HPI written by Rise Patience, MD   Chief Complaint: Confusion fever and weakness.  HPI: David [name] is a 74 y.o. male with history of esophageal cancer PEG tube placement BPH dementia depression who was recently admitted for syncope on August 06, 2019 discharged on Aug 13, 2019 was observed to be increasingly weak over the last 4 to 5 days and getting more confused.  Patient also had some vomiting and patient's oncologist Dr. Learta Codding advised to stop the enteral feeds for 2 days.  And it was restarted again 2 days ago following which later in the evening patient had some vomiting patient wife also noticed some fevers around around 99 degrees.  Patient also has very frequent urination.  Did not complain of any pain or any shortness of breath.  Patient also has been having dizziness when he stands up.  Has some cough nonproductive.  Given these changes patient was brought to the ER.  ED Course: In the ER patient was alert awake oriented to his name and place moving all extremities.  CT head was unremarkable.  Labs show WBC of 10.6 hemoglobin 12.9 calcium was 10.8 sodium 131 lactic acid 0.8 EKG normal sinus rhythm.  Covid test was negative.  UA was concerning for UTI and chest x-ray shows possible infiltrates.  Cultures were obtained and patient started on antibiotics and admitted for further observation.   Hospital course:  Acute  metabolic encephalopathy Multifactorial in setting of infection, dementia, hypercalcemia. Mental status appears to have improved and at baseline.  Pneumonia Chest x-ray concerning for infiltrates. Patient treated with Ceftriaxone and azithromycin. Completed course.  Hyponatremia Mild. Stable. Nutrition and fluids.  Hypercalcemia Given Zometa on 5/25. Calcium improved. Oncology on board. Calcium stable.  Nausea/vomiting PEG tube in place. EGD significant for esophagitis. Improved. Regular diet. Recommend dietician consult for continued evaluation of caloric intake and ability to wean off tube feeds  Orthostatic hypotension Cardiology consulted. Patient is on Florinef, midodrine and Droxidopa. Orthostatic vitals improved with improvement in symptoms.  Hypokalemia Potassium supplementation. Recheck BMP in 5-7 days  Depression Continue Wellbutrin and Lexapro  Thrombocytopenia Stable.  Esophageal cancer Patient is followed by medical oncology. No mass seen on recent EGD. Patient is s/p radiation. PEG tube placed secondary to issues with oral intake secondary to mass.  Urinary frequency BPH Patient is on Tamsulosin  Dementia Patient likely baseline per chart review. Recommend scheduled voiding.  Nephrolithiasis Non-obstructing.  Goals of care Patient is now DNR. Hospice referral was made for patient for possible home hospice.  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)   Dementia without behavioral disturbance (HCC)   Neurocardiogenic syncope   Orthostatic hypotension   Intractable nausea and vomiting   SOB (shortness of breath)    Discharge Instructions   Allergies as of 09/10/2019      Reactions   Other    Other reaction(s): Other Sleep walk  Abilify [aripiprazole] Other (See Comments)   Short term memory loss   Lamictal [lamotrigine]    Increased  agitation      Medication List    STOP taking these medications   HYDROcodone-acetaminophen 5-325 MG tablet Commonly known as: NORCO/VICODIN     TAKE these medications   buPROPion 150 MG 24 hr tablet Commonly known as: WELLBUTRIN XL TAKE 1 TABLET ONCE DAILY.   Droxidopa 100 MG Caps Take 1 capsule (100 mg total) by mouth 3 (three) times daily.   escitalopram 20 MG tablet Commonly known as: LEXAPRO TAKE 1 TABLET ONCE DAILY.   esomeprazole 20 MG packet Commonly known as: NEXIUM Take 20 mg by mouth daily.   feeding supplement (KATE FARMS STANDARD 1.4) Liqd liquid Place 325 mLs into feeding tube daily. What changed:   how much to take  how to take this  when to take this  additional instructions   feeding supplement (KATE FARMS STANDARD 1.4) Liqd liquid Take 325 mLs by mouth 2 (two) times daily between meals. What changed: You were already taking a medication with the same name, and this prescription was added. Make sure you understand how and when to take each.   fludrocortisone 0.1 MG tablet Commonly known as: FLORINEF Take 2 tablets (0.2 mg total) by mouth daily. Start taking on: September 11, 2019   iron polysaccharides 150 MG capsule Commonly known as: NIFEREX Take 1 capsule (150 mg total) by mouth daily. Start taking on: September 11, 2019   LORazepam 0.5 MG tablet Commonly known as: ATIVAN Take 1 tablet (0.5 mg total) by mouth every 6 (six) hours as needed for anxiety.   midodrine 10 MG tablet Commonly known as: PROAMATINE Take 1 tablet (10 mg total) by mouth 3 (three) times daily with meals.   ondansetron 8 MG tablet Commonly known as: ZOFRAN Take 1 tablet (8 mg total) by mouth every 8 (eight) hours as needed for nausea or vomiting.   potassium chloride 10 MEQ tablet Commonly known as: KLOR-CON Take 2 tablets (20 mEq total) by mouth daily for 5 days.   prochlorperazine 5 MG tablet Commonly known as: COMPAZINE Take 1 tablet (5 mg total) by mouth every 6  (six) hours as needed for nausea or vomiting. May take orally or crush and take via tube.   simvastatin 40 MG tablet Commonly known as: ZOCOR Take 40 mg by mouth daily.   sucralfate 1 GM/10ML suspension Commonly known as: CARAFATE Take 10 mLs (1 g total) by mouth 4 (four) times daily -  with meals and at bedtime.   tamsulosin 0.4 MG Caps capsule Commonly known as: FLOMAX Take 0.4 mg by mouth at bedtime.   vitamin B-12 1000 MCG tablet Commonly known as: CYANOCOBALAMIN Take 1,000 mcg by mouth daily.   VITAMIN D3 PO Take 2,000 Units by mouth daily.            Durable Medical Equipment  (From admission, onward)         Start     Ordered   09/10/19 1213  For home use only DME standard manual wheelchair with seat cushion  Once    Comments: Patient suffers from Dementia and orthostatic vitals which impairs their ability to perform daily activities like bathing, dressing, feeding, grooming and toileting in the home.  A cane, crutch or walker will not resolve issue with performing activities of daily living. A wheelchair will allow patient to safely perform daily activities. Patient can safely propel the wheelchair in the home  or has a caregiver who can provide assistance. Length of need 6 months . Accessories: elevating leg rests (ELRs), wheel locks, extensions and anti-tippers.   09/10/19 1212   09/10/19 1213  For home use only DME wheelchair cushion (seat and back)  Once     09/10/19 1212         Contact information for after-discharge care    Destination    [facility] Preferred [facility]F .   Service: Skilled Nursing Contact information: [place] [phone]             Allergies  Allergen Reactions  . Other     Other reaction(s): Other Sleep walk   . Abilify [Aripiprazole] Other (See Comments)    Short term memory loss  . Lamictal [Lamotrigine]     Increased agitation    Consultations:  Medical  oncology   Procedures/Studies: DG Chest 2 View  Result Date: 08/25/2019 CLINICAL DATA:  Fever. EXAM: CHEST - 2 VIEW COMPARISON:  Chest radiograph 05/06/2019. PET CT 07/05/2019 FINDINGS: Upper normal heart size. Aortic atherosclerosis. Right paratracheal soft tissue density corresponds to adenopathy on PET. Streaky right infrahilar opacities. No focal airspace disease, pleural effusion, or pneumothorax. No acute osseous abnormalities are seen. IMPRESSION: 1. Streaky right infrahilar opacities may be atelectasis or potentially aspiration. 2. Right paratracheal adenopathy. Electronically Signed   By: Keith Rake M.D.   On: 08/25/2019 23:19   DG Abd 1 View  Result Date: 08/29/2019 CLINICAL DATA:  Intractable nausea and vomiting. History of esophageal cancer. EXAM: ABDOMEN - 1 VIEW COMPARISON:  08/05/2019 abdominal film and CT scan 08/28/2019 FINDINGS: There is scattered contrast in the right colon and rectum. No findings for obstruction or perforation. IMPRESSION: No findings for bowel obstruction. Electronically Signed   By: Marijo Sanes M.D.   On: 08/29/2019 08:05   CT HEAD WO CONTRAST  Result Date: 08/26/2019 CLINICAL DATA:  Facial trauma, fall EXAM: CT HEAD WITHOUT CONTRAST TECHNIQUE: Contiguous axial images were obtained from the base of the skull through the vertex without intravenous contrast. COMPARISON:  08/25/2019 FINDINGS: Brain: There is atrophy and chronic small vessel disease changes. No acute intracranial abnormality. Specifically, no hemorrhage, hydrocephalus, mass lesion, acute infarction, or significant intracranial injury. Vascular: No hyperdense vessel or unexpected calcification. Skull: No acute calvarial abnormality. Sinuses/Orbits: Visualized paranasal sinuses and mastoids clear. Orbital soft tissues unremarkable. Other: None IMPRESSION: Atrophy, chronic microvascular disease. No acute intracranial abnormality. Electronically Signed   By: Rolm Baptise M.D.   On: 08/26/2019  18:31   CT Head Wo Contrast  Result Date: 08/25/2019 CLINICAL DATA:  Encephalopathy, nausea, and dizziness EXAM: CT HEAD WITHOUT CONTRAST TECHNIQUE: Contiguous axial images were obtained from the base of the skull through the vertex without intravenous contrast. COMPARISON:  CT 08/06/2019 FINDINGS: Brain: No evidence of acute infarction, hemorrhage, hydrocephalus, extra-axial collection or mass lesion/mass effect. Symmetric prominence of the ventricles, cisterns and sulci compatible with parenchymal volume loss. Confluent and more patchy areas of white matter hypoattenuation are most compatible with chronic microvascular angiopathy. Vascular: Atherosclerotic calcification of the carotid siphons and intradural vertebral arteries. No hyperdense vessel. Skull: No calvarial fracture or suspicious osseous lesion. No scalp swelling or hematoma. Sinuses/Orbits: Paranasal sinuses and mastoid air cells are predominantly clear. Pneumatization of the petrous apices. Orbital structures are unremarkable aside from prior lens extractions. Other: None IMPRESSION: 1. No acute intracranial abnormality. 2. Chronic stable parenchymal volume loss and chronic microvascular angiopathy. Electronically Signed   By: March Rummage  [facility] M.D.   On: 08/25/2019 23:32   CT ABDOMEN PELVIS W CONTRAST  Result Date: 08/28/2019 CLINICAL DATA:  Nausea and vomiting, history of esophageal cancer EXAM: CT ABDOMEN AND PELVIS WITH CONTRAST TECHNIQUE: Multidetector CT imaging of the abdomen and pelvis was performed using the standard protocol following bolus administration of intravenous contrast. CONTRAST:  174mL OMNIPAQUE IOHEXOL 300 MG/ML  SOLN COMPARISON:  05/06/2019, 07/05/2019 FINDINGS: Lower chest: Circumferential wall thickening of the distal esophagus again noted, consistent with known esophageal cancer. Overall, significant improvement since prior PET scan. No acute airspace disease.  Trace right pleural effusion. Hepatobiliary: Minimal  gallbladder sludge. No calcified gallstones or acute cholecystitis. Liver is unremarkable. Pancreas: Unremarkable. No pancreatic ductal dilatation or surrounding inflammatory changes. Spleen: Spleen is enlarged measuring 15.8 cm in craniocaudal length, grossly stable. Adrenals/Urinary Tract: There is a nonobstructing 8 mm distal left ureteral calculus. Punctate 3 mm nonobstructing right renal calculus is noted. No obstructive uropathy within either kidney. The kidneys enhance normally and symmetrically. The adrenals are unremarkable. Bladder is moderately distended without focal abnormality. Stomach/Bowel: No bowel obstruction or ileus. Normal appendix right lower quadrant. Percutaneous gastrostomy tube is seen within the gastric lumen. Vascular/Lymphatic: There is progressive retroperitoneal adenopathy. Left para-aortic adenopathy measures up to 16 mm in short axis reference image 31. The lymphadenopathy at the celiac axis and within the gastrohepatic ligament on prior PET scan are no longer identified. Minimal atherosclerosis of the aorta unchanged. Reproductive: Prostate is mildly enlarged but stable. Other: No abdominal wall hernia or abnormality. No abdominopelvic ascites. Musculoskeletal: No acute or destructive bony lesions. Reconstructed images demonstrate no additional findings. IMPRESSION: 1. Persistent distal esophageal mass consistent with known esophageal cancer. Overall improvement since recent PET scan. 2. Progressive retroperitoneal lymphadenopathy, with new and enlarging lymph nodes as above. The previously seen celiac and gastrohepatic ligament adenopathy has resolved. 3. Stable splenomegaly. 4. Trace right pleural effusion. 5. Stable bilateral urinary tract calculi without obstruction. Electronically Signed   By: Randa Ngo M.D.   On: 08/28/2019 21:15   DG CHEST PORT 1 VIEW  Result Date: 08/31/2019 CLINICAL DATA:  Shortness of breath EXAM: PORTABLE CHEST 1 VIEW COMPARISON:  08/25/2019  FINDINGS: Normal heart size. Convexity of the upper right mediastinum from adenopathy by PET CT. There is no edema, consolidation, effusion, or pneumothorax. IMPRESSION: 1. No acute finding. 2. Known thoracic adenopathy. Electronically Signed   By: Monte Fantasia M.D.   On: 08/31/2019 07:34      Subjective: No issues overnight  Discharge Exam: Vitals:   09/09/19 2037 09/10/19 0342  BP: (!) 146/78 (!) 137/93  Pulse: 80 72  Resp: 18 18  Temp: 98.7 F (37.1 C) 98.3 F (36.8 C)  SpO2: 100% 99%   Vitals:   09/09/19 1344 09/09/19 2037 09/10/19 0342 09/10/19 0400  BP: 130/72 (!) 146/78 (!) 137/93   Pulse: 84 80 72   Resp: 20 18 18    Temp: 98.3 F (36.8 C) 98.7 F (37.1 C) 98.3 F (36.8 C)   TempSrc: Oral Oral Oral   SpO2: 100% 100% 99%   Weight:    101 kg  Height:        General: Not in acute distress Cardiovascular: RRR, S1/S2 +, no rubs, no gallops Respiratory: CTA bilaterally, no wheezing, no rhonchi Abdominal: Soft, NT, ND, bowel sounds + Extremities: no edema, no cyanosis    The results of significant diagnostics from this hospitalization (including imaging, microbiology, ancillary and laboratory) are listed below for reference.     Microbiology:  No results found for this or any previous visit (from the past 240 hour(s)).   Labs: BNP (last 3 results) No results for input(s): BNP in the last 8760 hours. Basic Metabolic Panel: Recent Labs  Lab 09/04/19 0519 09/04/19 0519 09/05/19 0552 09/06/19 0549 09/07/19 0543 09/08/19 0557 09/09/19 0457  NA 133*   < > 133* 135 131* 136 137  K 3.4*   < > 3.5 3.9 3.0* 3.4* 3.6  CL 102   < > 102 106 103 105 106  CO2 24   < > 21* 22 19* 21* 23  GLUCOSE 128*   < > 138* 110* 136* 140* 119*  BUN 9   < > 10 10 13 10 11   CREATININE 0.89   < > 0.79 0.87 0.80 0.90 0.97  CALCIUM 10.9*  10.7*   < > 9.6 9.4 9.1 9.0 8.9  MG 2.0  --   --   --   --   --   --   PHOS 2.8  --   --   --   --   --   --    < > = values in this  interval not displayed.   Liver Function Tests: Recent Labs  Lab 09/04/19 0519  AST 27  ALT 37  ALKPHOS 55  BILITOT 0.7  PROT 5.6*  ALBUMIN 3.1*   No results for input(s): LIPASE, AMYLASE in the last 168 hours. No results for input(s): AMMONIA in the last 168 hours. CBC: Recent Labs  Lab 09/04/19 0519  WBC 8.0  NEUTROABS 6.4  HGB 12.1*  HCT 36.4*  MCV 88.1  PLT 151   Cardiac Enzymes: No results for input(s): CKTOTAL, CKMB, CKMBINDEX, TROPONINI in the last 168 hours. BNP: Invalid input(s): POCBNP CBG: Recent Labs  Lab 09/09/19 2035 09/10/19 0027 09/10/19 0342 09/10/19 0755 09/10/19 1202  GLUCAP 94 108* 123* 103* 132*   D-Dimer No results for input(s): DDIMER in the last 72 hours. Hgb A1c No results for input(s): HGBA1C in the last 72 hours. Lipid Profile No results for input(s): CHOL, HDL, LDLCALC, TRIG, CHOLHDL, LDLDIRECT in the last 72 hours. Thyroid function studies No results for input(s): TSH, T4TOTAL, T3FREE, THYROIDAB in the last 72 hours.  Invalid input(s): FREET3 Anemia work up No results for input(s): VITAMINB12, FOLATE, FERRITIN, TIBC, IRON, RETICCTPCT in the last 72 hours. Urinalysis    Component Value Date/Time   COLORURINE YELLOW 08/25/2019 2300   APPEARANCEUR CLOUDY (A) 08/25/2019 2300   LABSPEC 1.014 08/25/2019 2300   PHURINE 7.0 08/25/2019 2300   GLUCOSEU NEGATIVE 08/25/2019 2300   HGBUR SMALL (A) 08/25/2019 2300   BILIRUBINUR NEGATIVE 08/25/2019 2300   KETONESUR NEGATIVE 08/25/2019 2300   PROTEINUR NEGATIVE 08/25/2019 2300   NITRITE NEGATIVE 08/25/2019 2300   LEUKOCYTESUR TRACE (A) 08/25/2019 2300   Sepsis Labs Invalid input(s): PROCALCITONIN,  WBC,  LACTICIDVEN Microbiology No results found for this or any previous visit (from the past 240 hour(s)).   Time coordinating discharge: 35 minutes  SIGNED:   Cordelia Poche, MD [facility] 09/10/2019, 12:13 PM

## 2019-09-10 NOTE — TOC Transition Note (Addendum)
Transition of Care [facility]) - CM/SW Discharge Note   Patient Details  Name: David [name] MRN: [id number] Date of Birth: June 26, 1945  [facility] CM/SW Contact:  Trish Mage, LCSW Phone Number: 09/10/2019, 8:54 AM   Clinical Narrative:   Authorization received from [employer].  Auth. #[id number] for 3 days, May 31 through June 2.  FAX clinicals to [phone].  Patient to transfer to [facility] [place] today.  Wife notified. PTAR arranged.  Nursing, please call report to [phone], room 506.  TOC sign off.    Final next level of care: Skilled Nursing Facility Barriers to Discharge: Barriers Resolved   Patient Goals and CMS Choice        Discharge Placement                       Discharge Plan and Services                                     Social Determinants of Health (SDOH) Interventions     Readmission Risk Interventions No flowsheet data found.

## 2019-09-12 NOTE — Progress Notes (Signed)
I spoke with [name] Louks and the plan is for him to get Keytruda on 6/8.  I called [facility] (SNF) and spoke with his nurse Mariann Laster regarding his appointments on 6/8.  I explained to her that he needs to arrive by 9:00 am on 6/8 and will probably be here until at least 12:30 to 1:00 pm.  She will fill out the appropriate sheet and forward to their transportation person Inez Catalina who has assured me they can provide transportation.  I called [name] Gryder back to let her know.

## 2019-09-13 ENCOUNTER — Other Ambulatory Visit: Payer: Self-pay

## 2019-09-13 ENCOUNTER — Non-Acute Institutional Stay: Payer: Medicare Other | Admitting: Primary Care

## 2019-09-13 ENCOUNTER — Encounter: Payer: Self-pay | Admitting: *Deleted

## 2019-09-13 DIAGNOSIS — Z515 Encounter for palliative care: Secondary | ICD-10-CM

## 2019-09-13 NOTE — Progress Notes (Signed)
[facility] Consult Note Telephone: [phone]  Fax: [phone]  PATIENT NAME: David [name] [address] [place] [address] [phone] (home)  DOB: 1945-08-23 MRN: [id number]  PRIMARY CARE PROVIDER:    Martyn Ehrich [facility], [place]  REFERRING PROVIDER:   Juluis Pitch [facility], [place]  RESPONSIBLE PARTY:   Extended Emergency Contact Information Primary Emergency Contact: [facility] Address: [address]          [place], [place] [address] Johnnette Litter of [place] Phone: [phone] Work Phone: [phone] Mobile Phone: [phone] Relation: Spouse Secondary Emergency Contact: Time Phone: [phone] Mobile Phone: [phone] Relation: Son  I met with patient in the facility.  ASSESSMENT AND RECOMMENDATIONS:   1. Advance Care Planning/Goals of Care: Goals include to maximize quality of life and symptom management. I spoke with POA [name] briefly on 3 occasions as she was not available. She is caring for grandchildren at this time. Patient is confused and not able to make own decisions based on our discussion today. Will f/u with MOST form from POA. I will mail until I can make phone contact.  2. Symptom Management:     I met with patient today in SNF. He appeared to be quite confused.  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 He offered to help me or tutor anyone I knew. I attempted to discuss several times with POA but she was unavailable but I will f/u with discussion to establish advance directives. None are on file in the Epic system.   Staff has no concerns at this time. Weight appears stable and he is being fed via tube until he is assessed by ST and perhaps a swallow study.  I will follow up with POA to discuss disposition further next week. She was unavailable today.  3. Family  /Caregiver/Community Supports:  Spouse is POA per pt. In SNF currently.   4. Cognitive / Functional decline:  Impaired. I found him trying to fit some toiletry items into a fanny pack. The items were clearly 2-3 times too big for the pack, yet he continued to attempt.  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 Able to be oob. Needs assistance with all ialds and most adls. Needs monitoring for acute delirium changes.   5. Follow up Palliative Care Visit: Palliative care will continue to follow for goals of care clarification and symptom management. Return 2-4 weeks or prn.  I spent 45 minutes providing this consultation,  from 1300 to 1345. More than 50% of the time in this consultation was spent coordinating communication.   HISTORY OF PRESENT ILLNESS:  [name] is a 74 y.o. year old male with multiple medical problems including malignant neoplasm of lower third of esophagus, major depressive disorder, generalized anxiety disorder, mild dementia with behavioral disturbance. Palliative Care was asked to follow this patient by consultation request of Martyn Ehrich MD to help address advance care planning and goals of care. This is an initial visit.  CODE STATUS: TBD  PPS: 40%  HOSPICE ELIGIBILITY/DIAGNOSIS: TBD  PAST MEDICAL HISTORY:  Past Medical History:  Diagnosis Date  . Anxiety   . BPH (benign prostatic hyperplasia)   . Cancer ([place])   . Dementia ([place])   . Depression   . Hyperlipidemia   . Hypertension   . Kidney stone   . Memory loss   .  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 Sleep apnea    CPAP  . Vitamin D deficiency     SOCIAL HX:  Social History   Tobacco Use  . Smoking status: Never Smoker  . Smokeless tobacco: Never Used  Substance Use Topics  . Alcohol use: Yes    Alcohol/week: 1.0 - 2.0 standard drinks    Types: 1 - 2 Shots of liquor per week    Comment: 1-2 shots 4-5 x week    ALLERGIES:  Allergies  Allergen  Reactions  . Other     Other reaction(s): Other Sleep walk   . Abilify [Aripiprazole] Other (See Comments)    Short term memory loss  . Lamictal [Lamotrigine]     Increased agitation     PERTINENT MEDICATIONS:  Outpatient Encounter Medications as of 09/13/2019  Medication Sig  . buPROPion (WELLBUTRIN XL) 150 MG 24 hr tablet TAKE 1 TABLET ONCE DAILY. (Patient taking differently: Take 150 mg by mouth daily. )  . Cholecalciferol (VITAMIN D3 PO) Take 2,000 Units by mouth daily.  . Droxidopa 100 MG CAPS Take 1 capsule (100 mg total) by mouth 3 (three) times daily.  Marland Kitchen escitalopram (LEXAPRO) 20 MG tablet TAKE 1 TABLET ONCE DAILY. (Patient taking differently: Take 20 mg by mouth daily. )  . esomeprazole (NEXIUM) 20 MG packet Take 20 mg by mouth daily.  . fludrocortisone (FLORINEF) 0.1 MG tablet Take 2 tablets (0.2 mg total) by mouth daily.  . iron polysaccharides (NIFEREX) 150 MG capsule Take 1 capsule (150 mg total) by mouth daily.  Marland Kitchen LORazepam (ATIVAN) 0.5 MG tablet Take 1 tablet (0.5 mg total) by mouth every 6 (six) hours as needed for anxiety.  . midodrine (PROAMATINE) 10 MG tablet Take 1 tablet (10 mg total) by mouth 3 (three) times daily with meals.  . Nutritional Supplements (FEEDING SUPPLEMENT, KATE FARMS STANDARD 1.4,) LIQD liquid Place 325 mLs into feeding tube daily.  . Nutritional Supplements (FEEDING SUPPLEMENT, KATE FARMS STANDARD 1.4,) LIQD liquid Take 325 mLs by mouth 2 (two) times daily between meals.  . ondansetron (ZOFRAN) 8 MG tablet Take 1 tablet (8 mg total) by mouth every 8 (eight) hours as needed for nausea or vomiting.  . potassium chloride (KLOR-CON) 10 MEQ tablet Take 2 tablets (20 mEq total) by mouth daily for 5 days.  . prochlorperazine (COMPAZINE) 5 MG tablet Take 1 tablet (5 mg total) by mouth every 6 (six) hours as needed for nausea or vomiting. May take orally or crush and take via tube.  . simvastatin (ZOCOR) 40 MG tablet Take 40 mg by mouth daily.  . sucralfate  (CARAFATE) 1 GM/10ML suspension Take 10 mLs (1 g total) by mouth 4 (four) times daily -  with meals and at bedtime.  . Tamsulosin HCl (FLOMAX) 0.4 MG CAPS Take 0.4 mg by mouth at bedtime.   . vitamin B-12 (CYANOCOBALAMIN) 1000 MCG tablet Take 1,000 mcg by mouth daily.   No facility-administered encounter medications on file as of 09/13/2019.    PHYSICAL EXAM / ROS:   Current and past weights: 215 lbs per Epic chart General: NAD, frail appearing, WNWD Cardiovascular: no chest pain reported, no edema  Pulmonary: no cough, no increased SOB, room air Abdomen: appetite fair, endorses constipation, continent of bowel GU: denies dysuria, continent of urine MSK:  no joint and ROM abnormalities, ambulatory with assistance Skin: no rashes or wounds reported Neurological: Weakness, confused, A and O x 1, discusses time as professor  Jason Coop, NP  ACHPN  COVID-19 PATIENT SCREENING TOOL  Person answering questions: ____________Staff_______ _____   1.  Is the patient or any family member in the home showing any signs or symptoms regarding respiratory infection?               Person with Symptom- __________NA_________________  a. Fever                                                                          Yes___ No___          ___________________  b. Shortness of breath                                                    Yes___ No___          ___________________ c. Cough/congestion                                       Yes___  No___         ___________________ d. Body aches/pains                                                         Yes___ No___        ____________________ e. Gastrointestinal symptoms (diarrhea, nausea)           Yes___ No___        ____________________  2. Within the past 14 days, has anyone living in the home had any contact with someone with or under investigation for COVID-19?    Yes___ No_X_   Person __________________

## 2019-09-13 NOTE — Progress Notes (Signed)
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 [facility] and was told this drug was not on their formulary. Provided help desk [phone] to call with clinical information. Wife already picked up the 30 day supply and paid cash of $157.27. Fowarded PA information to [place], Therapist, sports

## 2019-09-16 ENCOUNTER — Encounter: Payer: Self-pay | Admitting: Nutrition

## 2019-09-16 NOTE — Progress Notes (Signed)
I received a message from [place] who is the RD at [facility] where patient is currently living.  She asked for return call to better understand patient's nutrition history.  I called Patty however she was unavailable.  I left my name and phone number for return call.

## 2019-09-17 ENCOUNTER — Other Ambulatory Visit: Payer: Self-pay

## 2019-09-17 ENCOUNTER — Inpatient Hospital Stay: Payer: Medicare Other | Attending: Oncology

## 2019-09-17 ENCOUNTER — Inpatient Hospital Stay (HOSPITAL_BASED_OUTPATIENT_CLINIC_OR_DEPARTMENT_OTHER): Payer: Medicare Other | Admitting: Oncology

## 2019-09-17 ENCOUNTER — Other Ambulatory Visit: Payer: Self-pay | Admitting: *Deleted

## 2019-09-17 ENCOUNTER — Inpatient Hospital Stay: Payer: Medicare Other

## 2019-09-17 VITALS — BP 153/85 | HR 108 | Temp 97.7°F | Resp 24

## 2019-09-17 DIAGNOSIS — F0390 Unspecified dementia without behavioral disturbance: Secondary | ICD-10-CM | POA: Insufficient documentation

## 2019-09-17 DIAGNOSIS — C155 Malignant neoplasm of lower third of esophagus: Secondary | ICD-10-CM | POA: Diagnosis not present

## 2019-09-17 DIAGNOSIS — R1310 Dysphagia, unspecified: Secondary | ICD-10-CM | POA: Diagnosis not present

## 2019-09-17 DIAGNOSIS — M549 Dorsalgia, unspecified: Secondary | ICD-10-CM | POA: Insufficient documentation

## 2019-09-17 DIAGNOSIS — F419 Anxiety disorder, unspecified: Secondary | ICD-10-CM | POA: Insufficient documentation

## 2019-09-17 DIAGNOSIS — Z5112 Encounter for antineoplastic immunotherapy: Secondary | ICD-10-CM | POA: Diagnosis present

## 2019-09-17 DIAGNOSIS — N400 Enlarged prostate without lower urinary tract symptoms: Secondary | ICD-10-CM | POA: Diagnosis not present

## 2019-09-17 DIAGNOSIS — F329 Major depressive disorder, single episode, unspecified: Secondary | ICD-10-CM | POA: Diagnosis not present

## 2019-09-17 DIAGNOSIS — E785 Hyperlipidemia, unspecified: Secondary | ICD-10-CM | POA: Diagnosis not present

## 2019-09-17 DIAGNOSIS — C159 Malignant neoplasm of esophagus, unspecified: Secondary | ICD-10-CM | POA: Diagnosis present

## 2019-09-17 DIAGNOSIS — G4730 Sleep apnea, unspecified: Secondary | ICD-10-CM | POA: Insufficient documentation

## 2019-09-17 LAB — CMP (CANCER CENTER ONLY)
ALT: 22 U/L (ref 0–44)
AST: 21 U/L (ref 15–41)
Albumin: 3.3 g/dL — ABNORMAL LOW (ref 3.5–5.0)
Alkaline Phosphatase: 89 U/L (ref 38–126)
Anion gap: 14 (ref 5–15)
BUN: 13 mg/dL (ref 8–23)
CO2: 15 mmol/L — ABNORMAL LOW (ref 22–32)
Calcium: 10.9 mg/dL — ABNORMAL HIGH (ref 8.9–10.3)
Chloride: 107 mmol/L (ref 98–111)
Creatinine: 1 mg/dL (ref 0.61–1.24)
GFR, Est AFR Am: >60 mL/min (ref >60)
GFR, Estimated: >60 mL/min (ref >60)
Glucose, Bld: 132 mg/dL — ABNORMAL HIGH (ref 70–99)
Potassium: 3.6 mmol/L (ref 3.5–5.1)
Sodium: 136 mmol/L (ref 135–145)
Total Bilirubin: 1.2 mg/dL (ref 0.3–1.2)
Total Protein: 6.8 g/dL (ref 6.5–8.1)

## 2019-09-17 LAB — CBC WITH DIFFERENTIAL (CANCER CENTER ONLY)
Abs Immature Granulocytes: 0.14 10*3/uL — ABNORMAL HIGH (ref 0.00–0.07)
Basophils Absolute: 0 10*3/uL (ref 0.0–0.1)
Basophils Relative: 0 %
Eosinophils Absolute: 0 10*3/uL (ref 0.0–0.5)
Eosinophils Relative: 0 %
HCT: 39.5 % (ref 39.0–52.0)
Hemoglobin: 13.1 g/dL (ref 13.0–17.0)
Immature Granulocytes: 1 %
Lymphocytes Relative: 8 %
Lymphs Abs: 1.2 10*3/uL (ref 0.7–4.0)
MCH: 28.7 pg (ref 26.0–34.0)
MCHC: 33.2 g/dL (ref 30.0–36.0)
MCV: 86.4 fL (ref 80.0–100.0)
Monocytes Absolute: 1 10*3/uL (ref 0.1–1.0)
Monocytes Relative: 7 %
Neutro Abs: 12 10*3/uL — ABNORMAL HIGH (ref 1.7–7.7)
Neutrophils Relative %: 84 %
Platelet Count: 312 10*3/uL (ref 150–400)
RBC: 4.57 MIL/uL (ref 4.22–5.81)
RDW: 15.2 % (ref 11.5–15.5)
WBC Count: 14.3 10*3/uL — ABNORMAL HIGH (ref 4.0–10.5)
nRBC: 0 % (ref 0.0–0.2)

## 2019-09-17 LAB — TSH: TSH: 1.177 u[IU]/mL (ref 0.320–4.118)

## 2019-09-17 MED ORDER — SODIUM CHLORIDE 0.9 % IV SOLN
INTRAVENOUS | Status: DC
  Filled 2019-09-17 (×2): qty 250

## 2019-09-17 MED ORDER — ZOLEDRONIC ACID 4 MG/100ML IV SOLN
INTRAVENOUS | Status: AC
  Filled 2019-09-17: qty 100

## 2019-09-17 MED ORDER — SODIUM CHLORIDE 0.9 % IV SOLN
200.0000 mg | Freq: Once | INTRAVENOUS | Status: AC
  Administered 2019-09-17: 200 mg via INTRAVENOUS
  Filled 2019-09-17: qty 8

## 2019-09-17 MED ORDER — ZOLEDRONIC ACID 4 MG/100ML IV SOLN
4.0000 mg | Freq: Once | INTRAVENOUS | Status: AC
  Administered 2019-09-17: 4 mg via INTRAVENOUS

## 2019-09-17 MED ORDER — SODIUM CHLORIDE 0.9 % IV SOLN
Freq: Once | INTRAVENOUS | Status: AC
  Filled 2019-09-17: qty 250

## 2019-09-17 NOTE — Progress Notes (Signed)
[facility] OFFICE PROGRESS NOTE   Diagnosis: Esophagus cancer  INTERVAL HISTORY:   [name] was discharged to a skilled nursing facility on 09/10/2019.  He was treated for possible pneumonia while in the hospital.  He was also treated for hypercalcemia.  He had intermittent nausea and vomiting.  An upper endoscopy revealed no evidence of remaining tumor.  He was evaluated by cardiology for orthostatic hypotension.  He continues treatment with Florinef, midodrine, and droxidopa.  [name] is present by telephone for today's visit.  She feels he may be dehydrated, but he has been getting increased fluid with each bolus tube feeding.  He continues to have episodes of vomiting.  He is undergoing a speech therapy evaluation.  He complains of back pain.    Objective:  Vital signs in last 24 hours:  Blood pressure (!) 153/85, pulse (!) 108, temperature 97.7 F (36.5 C), temperature source Temporal, resp. rate (!) 24, SpO2 100 %.    HEENT: No thrush, the tongue is dry Resp: Lungs clear bilaterally Cardio: Regular rate and rhythm GI: Nontender, no mass, no hepatomegaly, left upper quadrant feeding tube site without evidence of infection Vascular: No leg edema Neuro: Alert, follows commands   Lab Results:  Lab Results  Component Value Date   WBC 14.3 (H) 09/17/2019   HGB 13.1 09/17/2019   HCT 39.5 09/17/2019   MCV 86.4 09/17/2019   PLT 312 09/17/2019   NEUTROABS 12.0 (H) 09/17/2019    CMP  Lab Results  Component Value Date   NA 136 09/17/2019   K 3.6 09/17/2019   CL 107 09/17/2019   CO2 15 (L) 09/17/2019   GLUCOSE 132 (H) 09/17/2019   BUN 13 09/17/2019   CREATININE 1.00 09/17/2019   CALCIUM 10.9 (H) 09/17/2019   PROT 6.8 09/17/2019   ALBUMIN 3.3 (L) 09/17/2019   AST 21 09/17/2019   ALT 22 09/17/2019   ALKPHOS 89 09/17/2019   BILITOT 1.2 09/17/2019   GFRNONAA >60 09/17/2019   GFRAA >60 09/17/2019     Medications: I have reviewed the patient's  current medications.   Assessment/Plan: 1. Esophagus cancer ? CT 05/06/2019-circumferential thickening of the distal esophagus, mild splenomegaly, bladder wall thickening, enlarged prostate ? Upper endoscopy 06/11/2019-ulcerated mass at the lower esophagus, 36 cm from the incisors, partially obstructing, biopsy revealed atypical cells-reactive change versus poorly differentiated neoplasm, outside consultation at [facility] on this biopsy and a repeat biopsy 06/21/2019 confirmed a poorly differentiated malignant neoplasm ? HER-2 negative, PD-L1 combined positive score -20 ? Repeat endoscopy 06/21/2019-partially obstructing esophageal tumor found in the lower third of the esophagus ? PET scan 07/05/2019-large esophageal mass with nodal involvement both above and below the diaphragm including a left periaortic node ? Palliative radiation to the esophagus mass starting 07/22/2019, completed 08/09/2019 ? Cycle 1 pembrolizumab 08/15/2019 ? Upper endoscopy 08/31/2019-no esophageal mass, no stricture, biopsy with inflammation, indefinite for dysplasia ? Cycle 2 pembrolizumab 09/17/2019 2. Dementia 3. Anxiety/depression 4. Sleep apnea 5. BPH 6. Dysphagia secondary to #1  Gastrostomy tube placement 07/08/2019 7. Hyperlipidemia 8. Admission with hematemesis 07/21/2019 9. Admission for dehydration and pre-syncope in clinic 08/06/19 10. Recurrent nausea and vomiting-etiology unclear, improved 11. Hypercalcemia, status post Zometa 09/03/2019 and 09/17/2019 12. Orthostasis-on medical therapy    Disposition: [name] appears unchanged.  He has progressive hypercalcemia today.  He will receive intravenous fluids and Zometa.  The hypercalcemia is likely related to the esophagus cancer.  The hypercalcemia persists despite radiation of the primary tumor and  1 cycle of pembrolizumab.  He will complete cycle 2 pembrolizumab today.  We will consider referring him for a restaging evaluation if the hypercalcemia  persists.  We will asked the cancer center nutritionist to assess his tube feeding/fluid schedule.  [name] will return for an office and lab visit on 09/25/2019.  Betsy Coder, MD  09/17/2019  10:04 AM

## 2019-09-17 NOTE — Patient Instructions (Signed)
[facility] Discharge Instructions for Patients Receiving Chemotherapy  Today you received the following chemotherapy agents keytruda; zometa  To help prevent nausea and vomiting after your treatment, we encourage you to take your nausea medication as directed If you develop nausea and vomiting that is not controlled by your nausea medication, call the clinic.   BELOW ARE SYMPTOMS THAT SHOULD BE REPORTED IMMEDIATELY:  *FEVER GREATER THAN 100.5 F  *CHILLS WITH OR WITHOUT FEVER  NAUSEA AND VOMITING THAT IS NOT CONTROLLED WITH YOUR NAUSEA MEDICATION  *UNUSUAL SHORTNESS OF BREATH  *UNUSUAL BRUISING OR BLEEDING  TENDERNESS IN MOUTH AND THROAT WITH OR WITHOUT PRESENCE OF ULCERS  *URINARY PROBLEMS  *BOWEL PROBLEMS  UNUSUAL RASH Items with * indicate a potential emergency and should be followed up as soon as possible.  Feel free to call the clinic should you have any questions or concerns. The clinic phone number is [phone].  Please show the [facility] at check-in to the Emergency Department and triage nurse.  Zoledronic Acid injection (Hypercalcemia, Oncology) What is this medicine? ZOLEDRONIC ACID (ZOE le dron ik AS id) lowers the amount of calcium loss from bone. It is used to treat too much calcium in your blood from cancer. It is also used to prevent complications of cancer that has spread to the bone. This medicine may be used for other purposes; ask your health care provider or pharmacist if you have questions. COMMON BRAND NAME(S): Zometa What should I tell my health care provider before I take this medicine? They need to know if you have any of these conditions:  aspirin-sensitive asthma  cancer, especially if you are receiving medicines used to treat cancer  dental disease or wear dentures  infection  kidney disease  receiving corticosteroids like dexamethasone or prednisone  an unusual or allergic reaction to zoledronic acid, other  medicines, foods, dyes, or preservatives  pregnant or trying to get pregnant  breast-feeding How should I use this medicine? This medicine is for infusion into a vein. It is given by a health care professional in a hospital or clinic setting. Talk to your pediatrician regarding the use of this medicine in children. Special care may be needed. Overdosage: If you think you have taken too much of this medicine contact a poison control center or emergency room at once. NOTE: This medicine is only for you. Do not share this medicine with others. What if I miss a dose? It is important not to miss your dose. Call your doctor or health care professional if you are unable to keep an appointment. What may interact with this medicine?  certain antibiotics given by injection  NSAIDs, medicines for pain and inflammation, like ibuprofen or naproxen  some diuretics like bumetanide, furosemide  teriparatide  thalidomide This list may not describe all possible interactions. Give your health care provider a list of all the medicines, herbs, non-prescription drugs, or dietary supplements you use. Also tell them if you smoke, drink alcohol, or use illegal drugs. Some items may interact with your medicine. What should I watch for while using this medicine? Visit your doctor or health care professional for regular checkups. It may be some time before you see the benefit from this medicine. Do not stop taking your medicine unless your doctor tells you to. Your doctor may order blood tests or other tests to see how you are doing. Women should inform their doctor if they wish to become pregnant or think they might be pregnant. There  is a potential for serious side effects to an unborn child. Talk to your health care professional or pharmacist for more information. You should make sure that you get enough calcium and vitamin D while you are taking this medicine. Discuss the foods you eat and the vitamins you take  with your health care professional. Some people who take this medicine have severe bone, joint, and/or muscle pain. This medicine may also increase your risk for jaw problems or a broken thigh bone. Tell your doctor right away if you have severe pain in your jaw, bones, joints, or muscles. Tell your doctor if you have any pain that does not go away or that gets worse. Tell your dentist and dental surgeon that you are taking this medicine. You should not have major dental surgery while on this medicine. See your dentist to have a dental exam and fix any dental problems before starting this medicine. Take good care of your teeth while on this medicine. Make sure you see your dentist for regular follow-up appointments. What side effects may I notice from receiving this medicine? Side effects that you should report to your doctor or health care professional as soon as possible:  allergic reactions like skin rash, itching or hives, swelling of the face, lips, or tongue  anxiety, confusion, or depression  breathing problems  changes in vision  eye pain  feeling faint or lightheaded, falls  jaw pain, especially after dental work  mouth sores  muscle cramps, stiffness, or weakness  redness, blistering, peeling or loosening of the skin, including inside the mouth  trouble passing urine or change in the amount of urine Side effects that usually do not require medical attention (report to your doctor or health care professional if they continue or are bothersome):  bone, joint, or muscle pain  constipation  diarrhea  fever  hair loss  irritation at site where injected  loss of appetite  nausea, vomiting  stomach upset  trouble sleeping  trouble swallowing  weak or tired This list may not describe all possible side effects. Call your doctor for medical advice about side effects. You may report side effects to FDA at 1-800-FDA-1088. Where should I keep my medicine? This drug  is given in a hospital or clinic and will not be stored at home. NOTE: This sheet is a summary. It may not cover all possible information. If you have questions about this medicine, talk to your doctor, pharmacist, or health care provider.  2020 Elsevier/Gold Standard (2013-08-24 14:19:39)

## 2019-09-17 NOTE — Progress Notes (Signed)
Faxed today's office note and MD consult note to facility at fax [phone]

## 2019-09-17 NOTE — Addendum Note (Signed)
Addended by: Tania Ade on: 09/17/2019 04:49 PM   Modules accepted: Orders

## 2019-09-17 NOTE — Progress Notes (Signed)
Per Dr. Stevphen Meuse to treat w/pulse 108. Will also receive Zometa today due to hypercalcemia. Will also receive 500 ml NS.Medication indication and potential side effects reviewed on telephone w/wife with Valda Favia, RN as witness. Patient is not mentally competent for consent.

## 2019-09-18 ENCOUNTER — Telehealth: Payer: Self-pay | Admitting: Oncology

## 2019-09-18 ENCOUNTER — Encounter: Payer: Self-pay | Admitting: *Deleted

## 2019-09-18 NOTE — Telephone Encounter (Signed)
Scheduled appts per secure chat msg from desk nurse Manuela Schwartz. Left voicemail with appt date and time.

## 2019-09-18 NOTE — Progress Notes (Signed)
Faxed appointment calendar to [facility] at [phone]

## 2019-09-19 ENCOUNTER — Other Ambulatory Visit: Payer: Self-pay

## 2019-09-19 ENCOUNTER — Emergency Department: Payer: Medicare Other

## 2019-09-19 ENCOUNTER — Inpatient Hospital Stay
Admission: EM | Admit: 2019-09-19 | Discharge: 2019-09-22 | DRG: 871 | Disposition: A | Discharge status: Hospice | Payer: Medicare Other | Source: Ambulatory Visit | Attending: Internal Medicine | Admitting: Internal Medicine

## 2019-09-19 ENCOUNTER — Encounter: Payer: Self-pay | Admitting: Emergency Medicine

## 2019-09-19 DIAGNOSIS — Z8249 Family history of ischemic heart disease and other diseases of the circulatory system: Secondary | ICD-10-CM

## 2019-09-19 DIAGNOSIS — Z6828 Body mass index (BMI) 28.0-28.9, adult: Secondary | ICD-10-CM

## 2019-09-19 DIAGNOSIS — N179 Acute kidney failure, unspecified: Secondary | ICD-10-CM | POA: Diagnosis present

## 2019-09-19 DIAGNOSIS — F0390 Unspecified dementia without behavioral disturbance: Secondary | ICD-10-CM | POA: Diagnosis present

## 2019-09-19 DIAGNOSIS — K769 Liver disease, unspecified: Secondary | ICD-10-CM | POA: Diagnosis present

## 2019-09-19 DIAGNOSIS — F329 Major depressive disorder, single episode, unspecified: Secondary | ICD-10-CM | POA: Diagnosis present

## 2019-09-19 DIAGNOSIS — R531 Weakness: Secondary | ICD-10-CM | POA: Diagnosis not present

## 2019-09-19 DIAGNOSIS — R627 Adult failure to thrive: Secondary | ICD-10-CM | POA: Diagnosis present

## 2019-09-19 DIAGNOSIS — Z79899 Other long term (current) drug therapy: Secondary | ICD-10-CM

## 2019-09-19 DIAGNOSIS — I951 Orthostatic hypotension: Secondary | ICD-10-CM | POA: Diagnosis present

## 2019-09-19 DIAGNOSIS — R000 Tachycardia, unspecified: Secondary | ICD-10-CM | POA: Diagnosis present

## 2019-09-19 DIAGNOSIS — N189 Chronic kidney disease, unspecified: Secondary | ICD-10-CM | POA: Diagnosis present

## 2019-09-19 DIAGNOSIS — E559 Vitamin D deficiency, unspecified: Secondary | ICD-10-CM | POA: Diagnosis present

## 2019-09-19 DIAGNOSIS — R197 Diarrhea, unspecified: Secondary | ICD-10-CM | POA: Diagnosis present

## 2019-09-19 DIAGNOSIS — Z992 Dependence on renal dialysis: Secondary | ICD-10-CM | POA: Diagnosis not present

## 2019-09-19 DIAGNOSIS — R410 Disorientation, unspecified: Secondary | ICD-10-CM

## 2019-09-19 DIAGNOSIS — E872 Acidosis, unspecified: Secondary | ICD-10-CM | POA: Insufficient documentation

## 2019-09-19 DIAGNOSIS — Z66 Do not resuscitate: Secondary | ICD-10-CM | POA: Diagnosis present

## 2019-09-19 DIAGNOSIS — E785 Hyperlipidemia, unspecified: Secondary | ICD-10-CM | POA: Diagnosis present

## 2019-09-19 DIAGNOSIS — Z811 Family history of alcohol abuse and dependence: Secondary | ICD-10-CM

## 2019-09-19 DIAGNOSIS — Z8349 Family history of other endocrine, nutritional and metabolic diseases: Secondary | ICD-10-CM

## 2019-09-19 DIAGNOSIS — Z820 Family history of epilepsy and other diseases of the nervous system: Secondary | ICD-10-CM

## 2019-09-19 DIAGNOSIS — C799 Secondary malignant neoplasm of unspecified site: Secondary | ICD-10-CM | POA: Diagnosis present

## 2019-09-19 DIAGNOSIS — C159 Malignant neoplasm of esophagus, unspecified: Secondary | ICD-10-CM | POA: Diagnosis present

## 2019-09-19 DIAGNOSIS — R1084 Generalized abdominal pain: Secondary | ICD-10-CM | POA: Diagnosis present

## 2019-09-19 DIAGNOSIS — F411 Generalized anxiety disorder: Secondary | ICD-10-CM

## 2019-09-19 DIAGNOSIS — Z87442 Personal history of urinary calculi: Secondary | ICD-10-CM

## 2019-09-19 DIAGNOSIS — G9341 Metabolic encephalopathy: Secondary | ICD-10-CM | POA: Diagnosis present

## 2019-09-19 DIAGNOSIS — N400 Enlarged prostate without lower urinary tract symptoms: Secondary | ICD-10-CM | POA: Diagnosis present

## 2019-09-19 DIAGNOSIS — Z888 Allergy status to other drugs, medicaments and biological substances status: Secondary | ICD-10-CM

## 2019-09-19 DIAGNOSIS — Z931 Gastrostomy status: Secondary | ICD-10-CM

## 2019-09-19 DIAGNOSIS — F330 Major depressive disorder, recurrent, mild: Secondary | ICD-10-CM

## 2019-09-19 DIAGNOSIS — I10 Essential (primary) hypertension: Secondary | ICD-10-CM | POA: Diagnosis present

## 2019-09-19 DIAGNOSIS — E876 Hypokalemia: Secondary | ICD-10-CM | POA: Diagnosis present

## 2019-09-19 DIAGNOSIS — Z9221 Personal history of antineoplastic chemotherapy: Secondary | ICD-10-CM

## 2019-09-19 DIAGNOSIS — A419 Sepsis, unspecified organism: Principal | ICD-10-CM | POA: Insufficient documentation

## 2019-09-19 DIAGNOSIS — R918 Other nonspecific abnormal finding of lung field: Secondary | ICD-10-CM | POA: Diagnosis present

## 2019-09-19 DIAGNOSIS — Z9109 Other allergy status, other than to drugs and biological substances: Secondary | ICD-10-CM

## 2019-09-19 DIAGNOSIS — F6081 Narcissistic personality disorder: Secondary | ICD-10-CM | POA: Diagnosis present

## 2019-09-19 DIAGNOSIS — R590 Localized enlarged lymph nodes: Secondary | ICD-10-CM | POA: Diagnosis present

## 2019-09-19 DIAGNOSIS — F419 Anxiety disorder, unspecified: Secondary | ICD-10-CM | POA: Diagnosis present

## 2019-09-19 DIAGNOSIS — Z515 Encounter for palliative care: Secondary | ICD-10-CM | POA: Diagnosis present

## 2019-09-19 DIAGNOSIS — Z8042 Family history of malignant neoplasm of prostate: Secondary | ICD-10-CM

## 2019-09-19 DIAGNOSIS — Z818 Family history of other mental and behavioral disorders: Secondary | ICD-10-CM

## 2019-09-19 DIAGNOSIS — Z823 Family history of stroke: Secondary | ICD-10-CM

## 2019-09-19 LAB — URINALYSIS, COMPLETE (UACMP) WITH MICROSCOPIC
Bacteria, UA: NONE SEEN
Bilirubin Urine: NEGATIVE
Glucose, UA: NEGATIVE mg/dL
Hgb urine dipstick: NEGATIVE
Ketones, ur: NEGATIVE mg/dL
Leukocytes,Ua: NEGATIVE
Nitrite: NEGATIVE
Protein, ur: NEGATIVE mg/dL
Specific Gravity, Urine: >1.046 — ABNORMAL HIGH (ref 1.005–1.030)
Squamous Epithelial / HPF: NONE SEEN (ref 0–5)
pH: 5 (ref 5.0–8.0)

## 2019-09-19 LAB — CBC WITH DIFFERENTIAL/PLATELET
Abs Immature Granulocytes: 0.11 10*3/uL — ABNORMAL HIGH (ref 0.00–0.07)
Basophils Absolute: 0 10*3/uL (ref 0.0–0.1)
Basophils Relative: 0 %
Eosinophils Absolute: 0 10*3/uL (ref 0.0–0.5)
Eosinophils Relative: 0 %
HCT: 35 % — ABNORMAL LOW (ref 39.0–52.0)
Hemoglobin: 11.9 g/dL — ABNORMAL LOW (ref 13.0–17.0)
Immature Granulocytes: 1 %
Lymphocytes Relative: 3 %
Lymphs Abs: 0.5 10*3/uL — ABNORMAL LOW (ref 0.7–4.0)
MCH: 29.2 pg (ref 26.0–34.0)
MCHC: 34 g/dL (ref 30.0–36.0)
MCV: 85.8 fL (ref 80.0–100.0)
Monocytes Absolute: 0.9 10*3/uL (ref 0.1–1.0)
Monocytes Relative: 5 %
Neutro Abs: 15.1 10*3/uL — ABNORMAL HIGH (ref 1.7–7.7)
Neutrophils Relative %: 91 %
Platelets: 235 10*3/uL (ref 150–400)
RBC: 4.08 MIL/uL — ABNORMAL LOW (ref 4.22–5.81)
RDW: 15.6 % — ABNORMAL HIGH (ref 11.5–15.5)
WBC: 16.6 10*3/uL — ABNORMAL HIGH (ref 4.0–10.5)
nRBC: 0 % (ref 0.0–0.2)

## 2019-09-19 LAB — PROCALCITONIN: Procalcitonin: 1 ng/mL

## 2019-09-19 LAB — COMPREHENSIVE METABOLIC PANEL
ALT: 26 U/L (ref 0–44)
AST: 29 U/L (ref 15–41)
Albumin: 3.2 g/dL — ABNORMAL LOW (ref 3.5–5.0)
Alkaline Phosphatase: 91 U/L (ref 38–126)
Anion gap: 9 (ref 5–15)
BUN: 22 mg/dL (ref 8–23)
CO2: 20 mmol/L — ABNORMAL LOW (ref 22–32)
Calcium: 9.9 mg/dL (ref 8.9–10.3)
Chloride: 106 mmol/L (ref 98–111)
Creatinine, Ser: 1.38 mg/dL — ABNORMAL HIGH (ref 0.61–1.24)
GFR calc Af Amer: 59 mL/min — ABNORMAL LOW (ref >60)
GFR calc non Af Amer: 51 mL/min — ABNORMAL LOW (ref >60)
Glucose, Bld: 124 mg/dL — ABNORMAL HIGH (ref 70–99)
Potassium: 3.5 mmol/L (ref 3.5–5.1)
Sodium: 135 mmol/L (ref 135–145)
Total Bilirubin: 1.2 mg/dL (ref 0.3–1.2)
Total Protein: 6.5 g/dL (ref 6.5–8.1)

## 2019-09-19 LAB — PROTIME-INR
INR: 1.2 (ref 0.8–1.2)
Prothrombin Time: 14.6 seconds (ref 11.4–15.2)

## 2019-09-19 LAB — LACTIC ACID, PLASMA
Lactic Acid, Venous: 4.2 mmol/L (ref 0.5–1.9)
Lactic Acid, Venous: 1.4 mmol/L (ref 0.5–1.9)

## 2019-09-19 MED ORDER — SUCRALFATE 1 GM/10ML PO SUSP
1.0000 g | Freq: Three times a day (TID) | ORAL | Status: DC
  Administered 2019-09-19 – 2019-09-22 (×9): 1 g
  Filled 2019-09-19 (×9): qty 10

## 2019-09-19 MED ORDER — LORAZEPAM 0.5 MG PO TABS
0.5000 mg | ORAL_TABLET | Freq: Four times a day (QID) | ORAL | Status: DC | PRN
  Administered 2019-09-20 – 2019-09-22 (×5): 0.5 mg
  Filled 2019-09-19 (×5): qty 1

## 2019-09-19 MED ORDER — ONDANSETRON HCL 4 MG PO TABS
4.0000 mg | ORAL_TABLET | Freq: Four times a day (QID) | ORAL | Status: DC | PRN
  Administered 2019-09-21: 4 mg via ORAL
  Filled 2019-09-19: qty 1

## 2019-09-19 MED ORDER — TAMSULOSIN HCL 0.4 MG PO CAPS
0.4000 mg | ORAL_CAPSULE | Freq: Every day | ORAL | Status: DC
  Administered 2019-09-19 – 2019-09-21 (×3): 0.4 mg via ORAL
  Filled 2019-09-19 (×3): qty 1

## 2019-09-19 MED ORDER — BUPROPION HCL ER (XL) 150 MG PO TB24
150.0000 mg | ORAL_TABLET | Freq: Every day | ORAL | Status: DC
  Administered 2019-09-22: 09:00:00 150 mg via ORAL
  Filled 2019-09-19: qty 1

## 2019-09-19 MED ORDER — ACETAMINOPHEN 650 MG RE SUPP: 650.0000 mg | Freq: Four times a day (QID) | RECTAL | Status: DC | PRN

## 2019-09-19 MED ORDER — ONDANSETRON HCL 4 MG/2ML IJ SOLN
4.0000 mg | Freq: Four times a day (QID) | INTRAMUSCULAR | Status: DC | PRN
  Administered 2019-09-19 – 2019-09-20 (×2): 4 mg via INTRAVENOUS
  Filled 2019-09-19 (×2): qty 2

## 2019-09-19 MED ORDER — ENOXAPARIN SODIUM 40 MG/0.4ML ~~LOC~~ SOLN
40.0000 mg | SUBCUTANEOUS | Status: DC
  Administered 2019-09-19 – 2019-09-21 (×3): 40 mg via SUBCUTANEOUS
  Filled 2019-09-19 (×3): qty 0.4

## 2019-09-19 MED ORDER — IOHEXOL 350 MG/ML SOLN
100.0000 mL | Freq: Once | INTRAVENOUS | Status: AC | PRN
  Administered 2019-09-19: 100 mL via INTRAVENOUS

## 2019-09-19 MED ORDER — MIDODRINE HCL 5 MG PO TABS
10.0000 mg | ORAL_TABLET | Freq: Three times a day (TID) | ORAL | Status: DC
  Administered 2019-09-20 – 2019-09-22 (×5): 10 mg
  Filled 2019-09-19 (×6): qty 2

## 2019-09-19 MED ORDER — PANTOPRAZOLE SODIUM 40 MG PO TBEC
40.0000 mg | DELAYED_RELEASE_TABLET | Freq: Every day | ORAL | Status: DC
  Administered 2019-09-22: 09:00:00 40 mg via ORAL
  Filled 2019-09-19: qty 1

## 2019-09-19 MED ORDER — METRONIDAZOLE IN NACL 5-0.79 MG/ML-% IV SOLN
500.0000 mg | Freq: Once | INTRAVENOUS | Status: AC
  Administered 2019-09-19: 500 mg via INTRAVENOUS
  Filled 2019-09-19: qty 100

## 2019-09-19 MED ORDER — DROXIDOPA 100 MG PO CAPS: 100.0000 mg | ORAL_CAPSULE | Freq: Three times a day (TID) | ORAL | Status: DC

## 2019-09-19 MED ORDER — POTASSIUM CHLORIDE IN NACL 20-0.9 MEQ/L-% IV SOLN
INTRAVENOUS | Status: DC
  Filled 2019-09-19 (×6): qty 1000

## 2019-09-19 MED ORDER — ACETAMINOPHEN 325 MG PO TABS: 650.0000 mg | ORAL_TABLET | Freq: Four times a day (QID) | ORAL | Status: DC | PRN

## 2019-09-19 MED ORDER — ESCITALOPRAM OXALATE 10 MG PO TABS
20.0000 mg | ORAL_TABLET | Freq: Every day | ORAL | Status: DC
  Administered 2019-09-20 – 2019-09-22 (×3): 20 mg
  Filled 2019-09-19 (×3): qty 2

## 2019-09-19 MED ORDER — ESOMEPRAZOLE MAGNESIUM 20 MG PO PACK: 20.0000 mg | PACK | Freq: Every day | ORAL | Status: DC

## 2019-09-19 MED ORDER — SODIUM CHLORIDE 0.9 % IV SOLN
2.0000 g | Freq: Three times a day (TID) | INTRAVENOUS | Status: DC
  Administered 2019-09-19 – 2019-09-22 (×8): 2 g via INTRAVENOUS
  Filled 2019-09-19 (×10): qty 2

## 2019-09-19 MED ORDER — SODIUM CHLORIDE 0.9 % IV BOLUS (SEPSIS)
1000.0000 mL | Freq: Once | INTRAVENOUS | Status: AC
  Administered 2019-09-19: 1000 mL via INTRAVENOUS

## 2019-09-19 MED ORDER — VANCOMYCIN HCL IN DEXTROSE 1-5 GM/200ML-% IV SOLN
1000.0000 mg | Freq: Once | INTRAVENOUS | Status: AC
  Administered 2019-09-19: 1000 mg via INTRAVENOUS
  Filled 2019-09-19: qty 200

## 2019-09-19 MED ORDER — SODIUM CHLORIDE 0.9 % IV SOLN
2.0000 g | Freq: Once | INTRAVENOUS | Status: AC
  Administered 2019-09-19: 2 g via INTRAVENOUS
  Filled 2019-09-19: qty 2

## 2019-09-19 NOTE — ED Provider Notes (Signed)
[facility] Emergency Department Provider Note  ____________________________________________  Time seen: Approximately 2:14 PM  I have reviewed the triage vital signs and the nursing notes.   HISTORY  Chief Complaint Altered Mental Status and Diarrhea    Level 5 Caveat: Portions of the History and Physical including HPI and review of systems are unable to be completely obtained due to patient being a poor historian   HPI David [name] is a 74 y.o. male with a history of esophageal cancer, hypertension, dementia, kidney stones who is sent to the ED complaining of nausea vomiting and diarrhea today.  On review of electronic medical record, patient is undergoing chemotherapy, last treatment was 2 days ago.   Patient reports generalized abdominal pain.     Past Medical History:  Diagnosis Date  . Anxiety   . BPH (benign prostatic hyperplasia)   . Cancer ([place])   . Dementia ([place])   . Depression   . Hyperlipidemia   . Hypertension   . Kidney stone   . Memory loss   . 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 Sleep apnea    CPAP  . Vitamin D deficiency      Patient Active Problem List   Diagnosis Date Noted  . Metastatic cancer ([place]) 09/19/2019  . Sepsis ([place])   . Lactic acidosis   . Hypokalemia   . Acute metabolic encephalopathy   . Anxiety and depression   . Hypercalcemia 09/17/2019  . Intractable nausea and vomiting   . SOB (shortness of breath)   . Acute encephalopathy 08/26/2019  . Orthostatic hypotension 08/07/2019  . Neurocardiogenic syncope 08/06/2019  . Syncope and collapse 08/06/2019  . Goals of care, counseling/discussion 07/30/2019  . Hematemesis 07/21/2019  . Dysphagia 07/05/2019  . Dementia without behavioral disturbance ([place]) 07/05/2019  . AKI (acute kidney injury) ([place]) 07/05/2019  . 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 Primary cancer of lower third of esophagus ([place])  07/04/2019  . Major depressive disorder, recurrent episode, severe ([place]) 10/28/2017  . Generalized anxiety disorder 10/28/2017  . Dyslipidemia 07/10/2012  . Metabolic syndrome 16/01/9603  . Prostatic hypertrophy 07/10/2012  . Testosterone deficiency 07/10/2012  . Essential hypertension 06/27/2011  . MDD (major depressive disorder), recurrent episode, mild ([place]) 05/16/2011     Past Surgical History:  Procedure Laterality Date  . BIOPSY  08/31/2019   Procedure: BIOPSY;  Surgeon: Wonda Horner, MD;  Location: [facility] ENDOSCOPY;  Service: Endoscopy;;  . CATARACT EXTRACTION, BILATERAL  2018  . ESOPHAGOGASTRODUODENOSCOPY N/A 08/31/2019   Procedure: ESOPHAGOGASTRODUODENOSCOPY (EGD);  Surgeon: Wonda Horner, MD;  Location: [facility];  Service: Endoscopy;  Laterality: N/A;  . EXTERNAL EAR SURGERY     child  . EYE SURGERY Bilateral    cataract  . IR GASTROSTOMY TUBE MOD SED  07/08/2019  . right knee meniscus  06/12/2008  . right talus repair     dislocated     Prior to Admission medications   Medication Sig Start Date End Date Taking? Authorizing Provider  Amino Acids-Protein Hydrolys (FEEDING SUPPLEMENT, PRO-STAT SUGAR FREE 64,) LIQD Take 30 mLs by mouth daily at 6 (six) AM.   Yes [provider]  buPROPion (WELLBUTRIN XL) 150 MG 24 hr tablet TAKE 1 TABLET ONCE DAILY. Patient taking differently: Take 150 mg by mouth daily.  07/18/19  Yes [name], PMHNP  Cholecalciferol (VITAMIN D3 PO) 2,000 Units by Gastrostomy Tube route daily.    Yes [provider]  Droxidopa 100 MG CAPS Take 1 capsule (100 mg total) by mouth 3 (three) times daily. Patient taking differently: 100 mg by Gastrostomy Tube route 3 (three) times daily.  09/10/19  Yes Mariel Aloe, MD  escitalopram (LEXAPRO) 20 MG tablet TAKE 1 TABLET ONCE DAILY. Patient taking differently: Place 20 mg into feeding tube daily.  07/18/19  Yes [name], PMHNP  esomeprazole (NEXIUM) 20 MG packet Take 20 mg by mouth  daily. 07/25/19  Yes [provider]  fludrocortisone (FLORINEF) 0.1 MG tablet Take 2 tablets (0.2 mg total) by mouth daily. Patient taking differently: Place 0.2 mg into feeding tube daily.  09/11/19  Yes Mariel Aloe, MD  midodrine (PROAMATINE) 10 MG tablet Take 1 tablet (10 mg total) by mouth 3 (three) times daily with meals. Patient taking differently: Place 10 mg into feeding tube 3 (three) times daily with meals.  08/23/19  Yes Ladell Pier, MD  Nutritional Supplements (KATE FARMS STANDARD 1.4 PO) 325 mLs by Gastrostomy Tube route in the morning, at noon, in the evening, and at bedtime. 200 ml water before and after each feeding   Yes [provider]  simvastatin (ZOCOR) 40 MG tablet Place 40 mg into feeding tube daily.    Yes [provider]  sucralfate (CARAFATE) 1 GM/10ML suspension Take 10 mLs (1 g total) by mouth 4 (four) times daily -  with meals and at bedtime. Patient taking differently: Place 1 g into feeding tube 4 (four) times daily -  with meals and at bedtime.  09/10/19  Yes Mariel Aloe, MD  Tamsulosin HCl (FLOMAX) 0.4 MG CAPS Take 0.4 mg by mouth at bedtime.    Yes [provider]  vitamin B-12 (CYANOCOBALAMIN) 1000 MCG tablet Place 1,000 mcg into feeding tube daily.    Yes [provider]  LORazepam (ATIVAN) 0.5 MG tablet Take 1 tablet (0.5 mg total) by mouth every 6 (six) hours as needed for anxiety. Patient taking differently: Place 0.5 mg into feeding tube every 6 (six) hours as needed for anxiety.  09/08/19   Mariel Aloe, MD  ondansetron (ZOFRAN) 8 MG tablet Take 1 tablet (8 mg total) by mouth every 8 (eight) hours as needed for nausea or vomiting. Patient taking differently: Place 8 mg into feeding tube every 8 (eight) hours as needed for nausea or vomiting.  08/22/19   Owens Shark, NP  prochlorperazine (COMPAZINE) 5 MG tablet Take 1 tablet (5 mg total) by mouth every 6 (six) hours as needed for nausea or vomiting. May  take orally or crush and take via tube. Patient taking differently: Place 5 mg into feeding tube every 6 (six) hours as needed for nausea or vomiting. May take orally or crush and take via tube. 07/16/19   Harle Stanford., PA-C     Allergies Other, Abilify [aripiprazole], and Lamictal [lamotrigine]   Family History  Problem Relation Age of Onset  . Alzheimer's disease Mother   . Depression Mother   . Cancer Father        prostate  . Thyroid disease Father   . Alzheimer's disease Maternal Grandmother   . Heart disease Maternal Grandfather   . Stroke Paternal Grandmother   . Stroke Paternal Grandfather   . Alcoholism Brother   . Autism Daughter   . Autism Maternal Uncle     Social History Social History   Tobacco Use  . Smoking status: Never Smoker  . Smokeless tobacco: Never Used  Vaping Use  .  Vaping Use: Never used  Substance Use Topics  . Alcohol use: Not Currently    Alcohol/week: 1.0 - 2.0 standard drink    Types: 1 - 2 Shots of liquor per week    Comment: 1-2 shots 4-5 x week  . Drug use: Not Currently    Types: Marijuana    Comment: Occasional    Review of Systems Level 5 Caveat: Portions of the History and Physical including HPI and review of systems are unable to be completely obtained due to patient being a poor historian   Constitutional:   No known fever.  ENT:   No rhinorrhea. Cardiovascular:   No chest pain or syncope. Respiratory:   No dyspnea or cough. Gastrointestinal:   Positive as above for abdominal pain, vomiting and diarrhea.  Musculoskeletal:   Negative for focal pain or swelling ____________________________________________   PHYSICAL EXAM:  VITAL SIGNS: ED Triage Vitals  Enc Vitals Group     BP 09/19/19 1105 132/72     Pulse Rate 09/19/19 1105 (!) 110     Resp 09/19/19 1105 (!) 28     Temp 09/19/19 1105 98.2 F (36.8 C)     Temp Source 09/19/19 1105 Oral     SpO2 09/19/19 1105 99 %     Weight --      Height 09/19/19 1106 6\' 2"   (1.88 m)     Head Circumference --      Peak Flow --      Pain Score 09/19/19 1106 0     Pain Loc --      Pain Edu? --      Excl. in [place]? --     Vital signs reviewed, nursing assessments reviewed.   Constitutional:   Alert and oriented to person and place.  Ill-appearing. Eyes:   Conjunctivae are normal. EOMI. PERRL. ENT      Head:   Normocephalic and atraumatic.      Nose:   No congestion/rhinnorhea.       Mouth/Throat:   Dry mucous membranes, no pharyngeal erythema. No peritonsillar mass.       Neck:   No meningismus. Full ROM. Hematological/Lymphatic/Immunilogical:   No cervical lymphadenopathy. Cardiovascular:   Tachycardia heart rate 110. Symmetric bilateral radial and DP pulses.  No murmurs. Cap refill less than 2 seconds. Respiratory:   Normal respiratory effort without tachypnea/retractions. Breath sounds are clear and equal bilaterally. No wheezes/rales/rhonchi. Gastrointestinal:   Soft with generalized abdominal tenderness. Non distended. There is no CVA tenderness.  No rebound, rigidity, or guarding.  Musculoskeletal:   Normal range of motion in all extremities. No joint effusions.  No lower extremity tenderness.  No edema. Neurologic:   Normal speech and language.  Motor grossly intact. No acute focal neurologic deficits are appreciated.  Skin:    Skin is warm, dry and intact. No rash noted.  No petechiae, purpura, or bullae.  ____________________________________________    LABS (pertinent positives/negatives) (all labs ordered are listed, but only abnormal results are displayed) Labs Reviewed  COMPREHENSIVE METABOLIC PANEL - Abnormal; Notable for the following components:      Result Value   CO2 20 (*)    Glucose, Bld 124 (*)    Creatinine, Ser 1.38 (*)    Albumin 3.2 (*)    GFR calc non Af Amer 51 (*)    GFR calc Af Amer 59 (*)    All other components within normal limits  LACTIC ACID, PLASMA - Abnormal; Notable for the following components:  Lactic Acid,  Venous 4.2 (*)    All other components within normal limits  CBC WITH DIFFERENTIAL/PLATELET - Abnormal; Notable for the following components:   WBC 16.6 (*)    RBC 4.08 (*)    Hemoglobin 11.9 (*)    HCT 35.0 (*)    RDW 15.6 (*)    Neutro Abs 15.1 (*)    Lymphs Abs 0.5 (*)    Abs Immature Granulocytes 0.11 (*)    All other components within normal limits  URINALYSIS, COMPLETE (UACMP) WITH MICROSCOPIC - Abnormal; Notable for the following components:   Color, Urine YELLOW (*)    APPearance CLEAR (*)    Specific Gravity, Urine >1.046 (*)    All other components within normal limits  CULTURE, BLOOD (ROUTINE X 2)  CULTURE, BLOOD (ROUTINE X 2)  URINE CULTURE  LACTIC ACID, PLASMA  PROTIME-INR  PROCALCITONIN  CREATININE, SERUM   ____________________________________________   EKG  Interpreted by me Sinus tachycardia rate 107.  Normal axis and intervals.  Normal QRS.  Slight ST depression in V3 which is nonspecific.  Otherwise normal ST segments, normal T waves.  No acute ischemic changes.  ____________________________________________    RADIOLOGY  DG Chest 2 View  Result Date: 09/19/2019 CLINICAL DATA:  Possible sepsis. EXAM: CHEST - 2 VIEW COMPARISON:  Chest x-ray dated Aug 31, 2019. FINDINGS: Normal heart size. Unchanged right paratracheal lymphadenopathy with leftward deviation of the trachea. Normal pulmonary vascularity. Minimal bibasilar atelectasis. No focal consolidation, pleural effusion, or pneumothorax. No acute osseous abnormality. IMPRESSION: 1. Minimal bibasilar atelectasis. 2. Unchanged right paratracheal lymphadenopathy. Electronically Signed   By: Titus Dubin M.D.   On: 09/19/2019 12:00   CT Angio Chest PE W and/or Wo Contrast  Result Date: 09/19/2019 CLINICAL DATA:  Abdominal pain, nausea, vomiting. Altered mental status, hypotension. History of esophageal cancer EXAM: CT ANGIOGRAPHY CHEST CT ABDOMEN AND PELVIS WITH CONTRAST TECHNIQUE: Multidetector CT imaging  of the chest was performed using the standard protocol during bolus administration of intravenous contrast. Multiplanar CT image reconstructions and MIPs were obtained to evaluate the vascular anatomy. Multidetector CT imaging of the abdomen and pelvis was performed using the standard protocol during bolus administration of intravenous contrast. CONTRAST:  110mL OMNIPAQUE IOHEXOL 350 MG/ML SOLN COMPARISON:  PET-CT 09/04/2019.  CT abdomen pelvis 08/28/2019 FINDINGS: CTA CHEST FINDINGS Cardiovascular: Pulmonary vasculature is adequately opacified. No filling defect to the segmental branch level to suggest pulmonary embolism. Heart size is within normal limits. Trace pericardial effusion. Ascending thoracic aorta measures 4.0 cm in diameter. Scattered atherosclerotic calcification of the thoracic aorta. Three vessel arch without evidence of hemodynamically significant stenosis of the arch vessels. Mediastinum/Nodes: Marked interval enlargement of masslike conglomerate adenopathy within the high right paratracheal region with heterogeneous mass measuring approximately 5.2 x 6.2 x 6.4 cm (series 8, image 22; series 11, image 77). On PET-CT 07/05/2019 there was a enlarged lymph node at this location measuring 2.0 cm short axis. Enlarging bilateral supraclavicular adenopathy with reference node in the right supraclavicular region measuring 2.0 cm short axis (series 8, image 6) and left supraclavicular node measuring 1.9 cm (series 8, image 1). Multiple enlarging mediastinal lymph nodes including 1.6 cm precarinal node (series 8, image 42). Conglomerate masslike subcarinal adenopathy measuring approximately 4.2 x 2.9 cm (series 8, image 51). There are mildly enlarged bilateral hilar lymph nodes. No axillary lymphadenopathy. Circumferential masslike thickening of the distal esophagus is similar to prior CT 08/28/2019 but decreased from the previous PET-CT. Unremarkable thyroid. Trachea is deviated to  the left from enlarged  right paratracheal mass. Lungs/Pleura: New irregular 7 mm pulmonary nodule at the posterior right lung apex (series 10, image 16). No additional pulmonary nodules or masses. Bibasilar atelectasis. Lungs are otherwise clear. No pleural effusion or pneumothorax. Musculoskeletal: No suspicious osseous lesions.  No acute finding. Review of the MIP images confirms the above findings. CT ABDOMEN and PELVIS FINDINGS Hepatobiliary: Interval development of innumerable small ill-defined low-density hepatic masses compatible with metastatic disease. There is a small amount of layering sludge or small stones within a non dilated gallbladder. No biliary dilatation. Pancreas: Unremarkable. No pancreatic ductal dilatation or surrounding inflammatory changes. Spleen: Stable splenomegaly. Adrenals/Urinary Tract: Unremarkable adrenal glands. Small probable cyst within the inferior pole the left kidney. Punctate nonobstructing right renal calculus, unchanged. There is no hydronephrosis. Urinary bladder is unremarkable. Stomach/Bowel: Masslike thickening of the distal esophagus compatible with known esophageal carcinoma. Percutaneous gastrostomy tube is appropriately located within the gastric body. No dilated loops of bowel. Mild circumferential thickening of the rectosigmoid colon suggesting a nonspecific colitis. Normal appendix in the right lower quadrant. Vascular/Lymphatic: Interval progression of abdominal and retroperitoneal lymphadenopathy including masslike conglomeration of left periaortic nodes now measuring 6.1 x 3.6 cm (series 5, image 35), previously measured 3.9 x 1.6 cm. Celiac node measures 2.3 cm short axis (series 5, image 27), new from prior. Masslike soft tissue density in the retroaortic space near the diaphragmatic hiatus (series 5, image 25), progressed from prior. No acute vascular findings. Scattered atherosclerosis. Reproductive: Prostate is stable in appearance. Other: No free air, free fluid, or abdominal  wall hernia. Musculoskeletal: No suspicious osseous findings. No acute abnormality. Review of the MIP images confirms the above findings. IMPRESSION: 1. No evidence of pulmonary embolism. 2. Marked interval progression of lymphadenopathy within the chest and abdomen including mass-like high right paratracheal conglomerate adenopathy measuring up to 6.4 cm. 3. Interval development of innumerable small ill-defined low-density hepatic masses compatible with metastatic disease. 4. New irregular 7 mm pulmonary nodule at the posterior right lung apex, concerning for metastatic disease. 5. Mild circumferential thickening of the rectosigmoid colon suggesting a nonspecific colitis. 6. Ascending thoracic aorta measures up to 4.0 cm in diameter. Recommend annual imaging followup by CTA or MRA. This recommendation follows 2010 ACCF/AHA/AATS/ACR/ASA/SCA/SCAI/SIR/STS/SVM Guidelines for the Diagnosis and Management of Patients with Thoracic Aortic Disease. Circulation. 2010; 121: K938-H829. Aortic aneurysm NOS (ICD10-I71.9) 7. Aortic Atherosclerosis (ICD10-I70.0). Electronically Signed   By: Davina Poke D.O.   On: 09/19/2019 13:32   CT ABDOMEN PELVIS W CONTRAST  Result Date: 09/19/2019 CLINICAL DATA:  Abdominal pain, nausea, vomiting. Altered mental status, hypotension. History of esophageal cancer EXAM: CT ANGIOGRAPHY CHEST CT ABDOMEN AND PELVIS WITH CONTRAST TECHNIQUE: Multidetector CT imaging of the chest was performed using the standard protocol during bolus administration of intravenous contrast. Multiplanar CT image reconstructions and MIPs were obtained to evaluate the vascular anatomy. Multidetector CT imaging of the abdomen and pelvis was performed using the standard protocol during bolus administration of intravenous contrast. CONTRAST:  146mL OMNIPAQUE IOHEXOL 350 MG/ML SOLN COMPARISON:  PET-CT 09/04/2019.  CT abdomen pelvis 08/28/2019 FINDINGS: CTA CHEST FINDINGS Cardiovascular: Pulmonary vasculature is  adequately opacified. No filling defect to the segmental branch level to suggest pulmonary embolism. Heart size is within normal limits. Trace pericardial effusion. Ascending thoracic aorta measures 4.0 cm in diameter. Scattered atherosclerotic calcification of the thoracic aorta. Three vessel arch without evidence of hemodynamically significant stenosis of the arch vessels. Mediastinum/Nodes: Marked interval enlargement of masslike conglomerate adenopathy within the high  right paratracheal region with heterogeneous mass measuring approximately 5.2 x 6.2 x 6.4 cm (series 8, image 22; series 11, image 77). On PET-CT 07/05/2019 there was a enlarged lymph node at this location measuring 2.0 cm short axis. Enlarging bilateral supraclavicular adenopathy with reference node in the right supraclavicular region measuring 2.0 cm short axis (series 8, image 6) and left supraclavicular node measuring 1.9 cm (series 8, image 1). Multiple enlarging mediastinal lymph nodes including 1.6 cm precarinal node (series 8, image 42). Conglomerate masslike subcarinal adenopathy measuring approximately 4.2 x 2.9 cm (series 8, image 51). There are mildly enlarged bilateral hilar lymph nodes. No axillary lymphadenopathy. Circumferential masslike thickening of the distal esophagus is similar to prior CT 08/28/2019 but decreased from the previous PET-CT. Unremarkable thyroid. Trachea is deviated to the left from enlarged right paratracheal mass. Lungs/Pleura: New irregular 7 mm pulmonary nodule at the posterior right lung apex (series 10, image 16). No additional pulmonary nodules or masses. Bibasilar atelectasis. Lungs are otherwise clear. No pleural effusion or pneumothorax. Musculoskeletal: No suspicious osseous lesions.  No acute finding. Review of the MIP images confirms the above findings. CT ABDOMEN and PELVIS FINDINGS Hepatobiliary: Interval development of innumerable small ill-defined low-density hepatic masses compatible with  metastatic disease. There is a small amount of layering sludge or small stones within a non dilated gallbladder. No biliary dilatation. Pancreas: Unremarkable. No pancreatic ductal dilatation or surrounding inflammatory changes. Spleen: Stable splenomegaly. Adrenals/Urinary Tract: Unremarkable adrenal glands. Small probable cyst within the inferior pole the left kidney. Punctate nonobstructing right renal calculus, unchanged. There is no hydronephrosis. Urinary bladder is unremarkable. Stomach/Bowel: Masslike thickening of the distal esophagus compatible with known esophageal carcinoma. Percutaneous gastrostomy tube is appropriately located within the gastric body. No dilated loops of bowel. Mild circumferential thickening of the rectosigmoid colon suggesting a nonspecific colitis. Normal appendix in the right lower quadrant. Vascular/Lymphatic: Interval progression of abdominal and retroperitoneal lymphadenopathy including masslike conglomeration of left periaortic nodes now measuring 6.1 x 3.6 cm (series 5, image 35), previously measured 3.9 x 1.6 cm. Celiac node measures 2.3 cm short axis (series 5, image 27), new from prior. Masslike soft tissue density in the retroaortic space near the diaphragmatic hiatus (series 5, image 25), progressed from prior. No acute vascular findings. Scattered atherosclerosis. Reproductive: Prostate is stable in appearance. Other: No free air, free fluid, or abdominal wall hernia. Musculoskeletal: No suspicious osseous findings. No acute abnormality. Review of the MIP images confirms the above findings. IMPRESSION: 1. No evidence of pulmonary embolism. 2. Marked interval progression of lymphadenopathy within the chest and abdomen including mass-like high right paratracheal conglomerate adenopathy measuring up to 6.4 cm. 3. Interval development of innumerable small ill-defined low-density hepatic masses compatible with metastatic disease. 4. New irregular 7 mm pulmonary nodule at the  posterior right lung apex, concerning for metastatic disease. 5. Mild circumferential thickening of the rectosigmoid colon suggesting a nonspecific colitis. 6. Ascending thoracic aorta measures up to 4.0 cm in diameter. Recommend annual imaging followup by CTA or MRA. This recommendation follows 2010 ACCF/AHA/AATS/ACR/ASA/SCA/SCAI/SIR/STS/SVM Guidelines for the Diagnosis and Management of Patients with Thoracic Aortic Disease. Circulation. 2010; 121: [id number]. Aortic aneurysm NOS (ICD10-I71.9) 7. Aortic Atherosclerosis (ICD10-I70.0). Electronically Signed   By: Davina Poke D.O.   On: 09/19/2019 13:32    ____________________________________________   PROCEDURES .Critical Care Performed by: Carrie Mew, MD Authorized by: Carrie Mew, MD   Critical care provider statement:    Critical care time (minutes):  35   Critical care time was exclusive  of:  Separately billable procedures and treating other patients   Critical care was necessary to treat or prevent imminent or life-threatening deterioration of the following conditions:  Shock, sepsis and dehydration   Critical care was time spent personally by me on the following activities:  Development of treatment plan with patient or surrogate, discussions with consultants, evaluation of patient's response to treatment, examination of patient, obtaining history from patient or surrogate, ordering and performing treatments and interventions, ordering and review of laboratory studies, ordering and review of radiographic studies, pulse oximetry, re-evaluation of patient's condition and review of old charts    ____________________________________________  DIFFERENTIAL DIAGNOSIS   UTI, intra-abdominal abscess, colitis, diverticulitis, sepsis, dehydration, electrolyte abnormality  CLINICAL IMPRESSION / ASSESSMENT AND PLAN / ED COURSE  Medications ordered in the ED: Medications  enoxaparin (LOVENOX) injection 40 mg (has no  administration in time range)  acetaminophen (TYLENOL) tablet 650 mg (has no administration in time range)    Or  acetaminophen (TYLENOL) suppository 650 mg (has no administration in time range)  ondansetron (ZOFRAN) tablet 4 mg (has no administration in time range)    Or  ondansetron (ZOFRAN) injection 4 mg (has no administration in time range)  0.9 % NaCl with KCl 20 mEq/ L  infusion (has no administration in time range)  ceFEPIme (MAXIPIME) 2 g in sodium chloride 0.9 % 100 mL IVPB (has no administration in time range)  Droxidopa CAPS 100 mg (has no administration in time range)  midodrine (PROAMATINE) tablet 10 mg (has no administration in time range)  buPROPion (WELLBUTRIN XL) 24 hr tablet 150 mg (has no administration in time range)  escitalopram (LEXAPRO) tablet 20 mg (has no administration in time range)  LORazepam (ATIVAN) tablet 0.5 mg (has no administration in time range)  esomeprazole (NEXIUM) suspension 20 mg (has no administration in time range)  sucralfate (CARAFATE) 1 GM/10ML suspension 1 g (has no administration in time range)  tamsulosin (FLOMAX) capsule 0.4 mg (has no administration in time range)  sodium chloride 0.9 % bolus 1,000 mL (0 mLs Intravenous Stopped 09/19/19 1318)    And  sodium chloride 0.9 % bolus 1,000 mL (0 mLs Intravenous Stopped 09/19/19 1339)    And  sodium chloride 0.9 % bolus 1,000 mL (0 mLs Intravenous Stopped 09/19/19 1428)  ceFEPIme (MAXIPIME) 2 g in sodium chloride 0.9 % 100 mL IVPB (0 g Intravenous Stopped 09/19/19 1300)  metroNIDAZOLE (FLAGYL) IVPB 500 mg (0 mg Intravenous Stopped 09/19/19 1352)  vancomycin (VANCOCIN) IVPB 1000 mg/200 mL premix (0 mg Intravenous Stopped 09/19/19 1300)  iohexol (OMNIPAQUE) 350 MG/ML injection 100 mL (100 mLs Intravenous Contrast Given 09/19/19 1222)    Pertinent labs & imaging results that were available during my care of the patient were reviewed by me and considered in my medical decision making (see chart for  details).   David [name] was evaluated in Emergency Department on 09/19/2019 for the symptoms described in the history of present illness. He was evaluated in the context of the global COVID-19 pandemic, which necessitated consideration that the patient might be at risk for infection with the SARS-CoV-2 virus that causes COVID-19. Institutional protocols and algorithms that pertain to the evaluation of patients at risk for COVID-19 are in a state of rapid change based on information released by regulatory bodies including the CDC and federal and state organizations. These policies and algorithms were followed during the patient's care in the ED.     Clinical Course as of Sep 19 1535  Thu  Sep 19, 2019  1214 Patient presents with generalized weakness, reports feeling confused with nonfocal neuro exam.  Tachycardia and tachypnea suspicious for sepsis from UTI or pneumonia versus profound dehydration.  Blood pressure is normal.  Lactate is 4.2, so I will start 30 mL/kg IV fluid bolus.  Using ideal body weight, his fluid requirement is 2500 mL.  Start empiric antibiotics with cefepime vancomycin and Flagyl due to cancer history and possible immunosuppression.  Will obtain a CT scan of the chest to evaluate for PE or occult pneumonia, CT abdomen pelvis to evaluate for intra-abdominal infectious source such as cholecystitis, diverticulitis, intra-abdominal abscess or appendicitis.   [PS]  1451 Repeat lactate normalized.  Urinalysis consistent with dehydration but negative for signs of infection.  Lactic Acid, Venous: 1.4 [PS]  1519 Sepsis reassessment has been completed. Lactate normalized. Blood pressure remains normal. No signs of persistent shock.   [PS]    Clinical Course User Index [PS] Carrie Mew, MD     ____________________________________________   FINAL CLINICAL IMPRESSION(S) / ED DIAGNOSES    Final diagnoses:  Confusion  Sepsis, due to unspecified organism, unspecified  whether acute organ dysfunction present [facility])  Metastatic malignant neoplasm, unspecified site [facility])     ED Discharge Orders    None      Portions of this note were generated with dragon dictation software. Dictation errors may occur despite best attempts at proofreading.   Carrie Mew, MD 09/19/19 1537

## 2019-09-19 NOTE — Progress Notes (Signed)
CODE SEPSIS - PHARMACY COMMUNICATION  **Broad Spectrum Antibiotics should be administered within 1 hour of Sepsis diagnosis**  Time Code Sepsis Called/Page Received: 1215  Antibiotics Ordered: vancomycin, cefepime, metronidazole  Time of 1st antibiotic administration: 1250  Additional action taken by pharmacy: [place] Resident 09/19/2019  12:26 PM

## 2019-09-19 NOTE — ED Notes (Signed)
Assigned bed @ 1800, spoke with RN Langley Gauss.

## 2019-09-19 NOTE — Progress Notes (Signed)
PHARMACY -  BRIEF ANTIBIOTIC NOTE   Pharmacy has received consult(s) for Vancomycin and Cefepime from an ED provider.  The patient's profile has been reviewed for ht/wt/allergies/indication/available labs.    One time order(s) placed for Vancomycin 1g IV and Cefepime 2g IV x 1 dose each.  Further antibiotics/pharmacy consults should be ordered by admitting physician if indicated.                       Thank you, Pearla Dubonnet 09/19/2019  12:23 PM

## 2019-09-19 NOTE — ED Triage Notes (Signed)
Pt presents from [facility] with c/o N/V/D. Facility also reports altered mental status and hypotension. Temp 97.7 for ems. 300cc fluids given by ems. Sepsis alert called in route. hx of esophageal cancer, cbg 134. Pt currently alert at this time

## 2019-09-19 NOTE — Consult Note (Signed)
Pharmacy Antibiotic Note  David [name] is a 74 y.o. male admitted on 09/19/2019 with  sepsis.  Pharmacy has been consulted for Cefepime dosing.  Plan: Cefepime 2g IV every 8 hours   Height: 6\' 2"  (188 cm) IBW/kg (Calculated) : 82.2  Temp (24hrs), Avg:98.2 F (36.8 C), Min:98.2 F (36.8 C), Max:98.2 F (36.8 C)  Recent Labs  Lab 09/17/19 0906 09/19/19 1109 09/19/19 1417  WBC 14.3* 16.6*  --   CREATININE 1.00 1.38*  --   LATICACIDVEN  --  4.2* 1.4    Estimated Creatinine Clearance: 61.4 mL/min (A) (by C-G formula based on SCr of 1.38 mg/dL (H)).    Allergies  Allergen Reactions   Other     Other reaction(s): Other Sleep walk    Abilify [Aripiprazole] Other (See Comments)    Short term memory loss   Lamictal [Lamotrigine]     Increased agitation    Antimicrobials this admission: 6/10 Cefepime >>   Microbiology results: 6/10 BCx: pending  6/10 UCx: pending   Thank you for allowing pharmacy to be a part of this patients care.  Pernell Dupre, PharmD, BCPS Clinical Pharmacist 09/19/2019 3:06 PM

## 2019-09-19 NOTE — H&P (Signed)
[facility] [place] NAME: David [name]    MR#:  [id number]  DATE OF BIRTH:  Jan 28, 1946  DATE OF ADMISSION:  09/19/2019  PRIMARY CARE PHYSICIAN: Leighton Ruff, MD   REQUESTING/REFERRING PHYSICIAN: Dr. Maisie Fus  CHIEF COMPLAINT:   I do not know what happened  HISTORY OF PRESENT ILLNESS:  David [name]  is a 74 y.o. male with a known history of stage IV esophageal cancer presents back to the hospital after being sent in by [facility] for heart rate of 140 and a low blood pressure that could not be read.  Patient is not the best historian and has some confusion.  On March 24 he was diagnosed with stage IV esophageal cancer.  He has a PEG tube in.  He had Keytruda dose on Monday.  He was put on medications for orthostatic hypotension.  CT scan of the chest abdomen pelvis shows new pulmonary nodules and lymphadenopathy in the chest and new liver lesions.  Hospitalist services were contacted for further evaluation.  Code sepsis was called in the emergency room.  PAST MEDICAL HISTORY:   Past Medical History:  Diagnosis Date  . Anxiety   . BPH (benign prostatic hyperplasia)   . Cancer ([place])   . Dementia ([place])   . Depression   . Hyperlipidemia   . Hypertension   . Kidney stone   . Memory loss   . 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 Sleep apnea    CPAP  . Vitamin D deficiency     PAST SURGICAL HISTORY:   Past Surgical History:  Procedure Laterality Date  . BIOPSY  08/31/2019   Procedure: BIOPSY;  Surgeon: Wonda Horner, MD;  Location: [facility];  Service: Endoscopy;;  . CATARACT EXTRACTION, BILATERAL  2018  . ESOPHAGOGASTRODUODENOSCOPY N/A 08/31/2019   Procedure: ESOPHAGOGASTRODUODENOSCOPY (EGD);  Surgeon: Wonda Horner, MD;  Location: [facility];  Service: Endoscopy;  Laterality: N/A;  . EXTERNAL EAR SURGERY     child  . EYE SURGERY Bilateral    cataract  . IR GASTROSTOMY TUBE MOD SED  07/08/2019   . right knee meniscus  06/12/2008  . right talus repair     dislocated    SOCIAL HISTORY:   Social History   Tobacco Use  . Smoking status: Never Smoker  . Smokeless tobacco: Never Used  Substance Use Topics  . Alcohol use: Not Currently    Alcohol/week: 1.0 - 2.0 standard drink    Types: 1 - 2 Shots of liquor per week    Comment: 1-2 shots 4-5 x week    FAMILY HISTORY:   Family History  Problem Relation Age of Onset  . Alzheimer's disease Mother   . Depression Mother   . Cancer Father        prostate  . Thyroid disease Father   . Alzheimer's disease Maternal Grandmother   . Heart disease Maternal Grandfather   . Stroke Paternal Grandmother   . Stroke Paternal Grandfather   . Alcoholism Brother   . Autism Daughter   . Autism Maternal Uncle     DRUG ALLERGIES:   Allergies  Allergen Reactions  . Other     Other reaction(s): Other Sleep walk   . Abilify [Aripiprazole] Other (See Comments)    Short term memory loss  . Lamictal [Lamotrigine]     Increased agitation    REVIEW OF SYSTEMS:  CONSTITUTIONAL: No fever, chills or sweats.  Positive for fatigue EYES: No  blurred or double vision.  EARS, NOSE, AND THROAT: No tinnitus or ear pain. No sore throat RESPIRATORY: No cough, shortness of breath. CARDIOVASCULAR: No chest pain.  GASTROINTESTINAL: No nausea, vomiting, diarrhea or abdominal pain. No blood in bowel movements GENITOURINARY: No dysuria, hematuria.  ENDOCRINE: No polyuria, nocturia,  HEMATOLOGY: No anemia, easy bruising or bleeding SKIN: No rash or lesion. MUSCULOSKELETAL: No joint pain.   NEUROLOGIC: No tingling, numbness, weakness.  PSYCHIATRY: History of anxiety and depression.   MEDICATIONS AT HOME:   Prior to Admission medications   Medication Sig Start Date End Date Taking? Authorizing Provider  buPROPion (WELLBUTRIN XL) 150 MG 24 hr tablet TAKE 1 TABLET ONCE DAILY. Patient taking differently: Take 150 mg by mouth daily.  07/18/19   [name], PMHNP  Cholecalciferol (VITAMIN D3 PO) 2,000 Units by Gastrostomy Tube route daily.     [provider]  Droxidopa 100 MG CAPS Take 1 capsule (100 mg total) by mouth 3 (three) times daily. Patient taking differently: 100 mg by Gastrostomy Tube route 3 (three) times daily.  09/10/19   Mariel Aloe, MD  escitalopram (LEXAPRO) 20 MG tablet TAKE 1 TABLET ONCE DAILY. Patient taking differently: Place 20 mg into feeding tube daily.  07/18/19   [name], PMHNP  esomeprazole (NEXIUM) 20 MG packet Take 20 mg by mouth daily. 07/25/19   [provider]  fludrocortisone (FLORINEF) 0.1 MG tablet Take 2 tablets (0.2 mg total) by mouth daily. Patient taking differently: Place 0.2 mg into feeding tube daily.  09/11/19   Mariel Aloe, MD  LORazepam (ATIVAN) 0.5 MG tablet Take 1 tablet (0.5 mg total) by mouth every 6 (six) hours as needed for anxiety. Patient taking differently: Place 0.5 mg into feeding tube every 6 (six) hours as needed for anxiety.  09/08/19   Mariel Aloe, MD  midodrine (PROAMATINE) 10 MG tablet Take 1 tablet (10 mg total) by mouth 3 (three) times daily with meals. Patient taking differently: Place 10 mg into feeding tube 3 (three) times daily with meals.  08/23/19   Ladell Pier, MD  Nutritional Supplements (KATE FARMS STANDARD 1.4 PO) 325 mLs by Gastrostomy Tube route in the morning, at noon, in the evening, and at bedtime. 200 ml water before and after each feeding    [provider]  ondansetron (ZOFRAN) 8 MG tablet Take 1 tablet (8 mg total) by mouth every 8 (eight) hours as needed for nausea or vomiting. Patient taking differently: Place 8 mg into feeding tube every 8 (eight) hours as needed for nausea or vomiting.  08/22/19   Owens Shark, NP  prochlorperazine (COMPAZINE) 5 MG tablet Take 1 tablet (5 mg total) by mouth every 6 (six) hours as needed for nausea or vomiting. May take orally or crush and take via tube. Patient taking differently:  Place 5 mg into feeding tube every 6 (six) hours as needed for nausea or vomiting. May take orally or crush and take via tube. 07/16/19   Tanner, Lyndon Code., PA-C  simvastatin (ZOCOR) 40 MG tablet Place 40 mg into feeding tube daily.     [provider]  sucralfate (CARAFATE) 1 GM/10ML suspension Take 10 mLs (1 g total) by mouth 4 (four) times daily -  with meals and at bedtime. Patient taking differently: Place 1 g into feeding tube 4 (four) times daily -  with meals and at bedtime.  09/10/19   Mariel Aloe, MD  Tamsulosin HCl (FLOMAX) 0.4 MG CAPS Take  0.4 mg by mouth at bedtime.     [provider]  vitamin B-12 (CYANOCOBALAMIN) 1000 MCG tablet Place 1,000 mcg into feeding tube daily.     [provider]      VITAL SIGNS:  Blood pressure (!) 170/82, pulse 89, temperature 98.2 F (36.8 C), temperature source Oral, resp. rate (!) 28, height 6\' 2"  (1.88 m), SpO2 100 %.  PHYSICAL EXAMINATION:  GENERAL:  74 y.o.-year-old patient lying in the bed with no acute distress.  EYES: Pupils equal, round, reactive to light and accommodation. No scleral icterus. HEENT: Head atraumatic, normocephalic. Oropharynx and nasopharynx clear.  NECK:  Supple, no jugular venous distention. No thyroid enlargement, no tenderness.  LUNGS: Normal breath sounds bilaterally, no wheezing, rales,rhonchi or crepitation. No use of accessory muscles of respiration.  CARDIOVASCULAR: S1, S2 normal. No murmurs, rubs, or gallops.  ABDOMEN: Soft, nontender, nondistended. Bowel sounds present. No organomegaly or mass.  EXTREMITIES: Trace pedal edema.  NEUROLOGIC: Cranial nerves II through XII are intact. Sensation intact. Gait not checked.  PSYCHIATRIC: The patient is alert and answers some yes/no questions.  SKIN: Scalp lesion  LABORATORY PANEL:   CBC Recent Labs  Lab 09/19/19 1109  WBC 16.6*  HGB 11.9*  HCT 35.0*  PLT 235    ------------------------------------------------------------------------------------------------------------------  Chemistries  Recent Labs  Lab 09/19/19 1109  NA 135  K 3.5  CL 106  CO2 20*  GLUCOSE 124*  BUN 22  CREATININE 1.38*  CALCIUM 9.9  AST 29  ALT 26  ALKPHOS 91  BILITOT 1.2   ------------------------------------------------------------------------------------------------------------------   RADIOLOGY:  DG Chest 2 View  Result Date: 09/19/2019 CLINICAL DATA:  Possible sepsis. EXAM: CHEST - 2 VIEW COMPARISON:  Chest x-ray dated Aug 31, 2019. FINDINGS: Normal heart size. Unchanged right paratracheal lymphadenopathy with leftward deviation of the trachea. Normal pulmonary vascularity. Minimal bibasilar atelectasis. No focal consolidation, pleural effusion, or pneumothorax. No acute osseous abnormality. IMPRESSION: 1. Minimal bibasilar atelectasis. 2. Unchanged right paratracheal lymphadenopathy. Electronically Signed   By: Titus Dubin M.D.   On: 09/19/2019 12:00   CT Angio Chest PE W and/or Wo Contrast  Result Date: 09/19/2019 CLINICAL DATA:  Abdominal pain, nausea, vomiting. Altered mental status, hypotension. History of esophageal cancer EXAM: CT ANGIOGRAPHY CHEST CT ABDOMEN AND PELVIS WITH CONTRAST TECHNIQUE: Multidetector CT imaging of the chest was performed using the standard protocol during bolus administration of intravenous contrast. Multiplanar CT image reconstructions and MIPs were obtained to evaluate the vascular anatomy. Multidetector CT imaging of the abdomen and pelvis was performed using the standard protocol during bolus administration of intravenous contrast. CONTRAST:  180mL OMNIPAQUE IOHEXOL 350 MG/ML SOLN COMPARISON:  PET-CT 09/04/2019.  CT abdomen pelvis 08/28/2019 FINDINGS: CTA CHEST FINDINGS Cardiovascular: Pulmonary vasculature is adequately opacified. No filling defect to the segmental branch level to suggest pulmonary embolism. Heart size is  within normal limits. Trace pericardial effusion. Ascending thoracic aorta measures 4.0 cm in diameter. Scattered atherosclerotic calcification of the thoracic aorta. Three vessel arch without evidence of hemodynamically significant stenosis of the arch vessels. Mediastinum/Nodes: Marked interval enlargement of masslike conglomerate adenopathy within the high right paratracheal region with heterogeneous mass measuring approximately 5.2 x 6.2 x 6.4 cm (series 8, image 22; series 11, image 77). On PET-CT 07/05/2019 there was a enlarged lymph node at this location measuring 2.0 cm short axis. Enlarging bilateral supraclavicular adenopathy with reference node in the right supraclavicular region measuring 2.0 cm short axis (series 8, image 6) and left supraclavicular node measuring 1.9 cm (  series 8, image 1). Multiple enlarging mediastinal lymph nodes including 1.6 cm precarinal node (series 8, image 42). Conglomerate masslike subcarinal adenopathy measuring approximately 4.2 x 2.9 cm (series 8, image 51). There are mildly enlarged bilateral hilar lymph nodes. No axillary lymphadenopathy. Circumferential masslike thickening of the distal esophagus is similar to prior CT 08/28/2019 but decreased from the previous PET-CT. Unremarkable thyroid. Trachea is deviated to the left from enlarged right paratracheal mass. Lungs/Pleura: New irregular 7 mm pulmonary nodule at the posterior right lung apex (series 10, image 16). No additional pulmonary nodules or masses. Bibasilar atelectasis. Lungs are otherwise clear. No pleural effusion or pneumothorax. Musculoskeletal: No suspicious osseous lesions.  No acute finding. Review of the MIP images confirms the above findings. CT ABDOMEN and PELVIS FINDINGS Hepatobiliary: Interval development of innumerable small ill-defined low-density hepatic masses compatible with metastatic disease. There is a small amount of layering sludge or small stones within a non dilated gallbladder. No  biliary dilatation. Pancreas: Unremarkable. No pancreatic ductal dilatation or surrounding inflammatory changes. Spleen: Stable splenomegaly. Adrenals/Urinary Tract: Unremarkable adrenal glands. Small probable cyst within the inferior pole the left kidney. Punctate nonobstructing right renal calculus, unchanged. There is no hydronephrosis. Urinary bladder is unremarkable. Stomach/Bowel: Masslike thickening of the distal esophagus compatible with known esophageal carcinoma. Percutaneous gastrostomy tube is appropriately located within the gastric body. No dilated loops of bowel. Mild circumferential thickening of the rectosigmoid colon suggesting a nonspecific colitis. Normal appendix in the right lower quadrant. Vascular/Lymphatic: Interval progression of abdominal and retroperitoneal lymphadenopathy including masslike conglomeration of left periaortic nodes now measuring 6.1 x 3.6 cm (series 5, image 35), previously measured 3.9 x 1.6 cm. Celiac node measures 2.3 cm short axis (series 5, image 27), new from prior. Masslike soft tissue density in the retroaortic space near the diaphragmatic hiatus (series 5, image 25), progressed from prior. No acute vascular findings. Scattered atherosclerosis. Reproductive: Prostate is stable in appearance. Other: No free air, free fluid, or abdominal wall hernia. Musculoskeletal: No suspicious osseous findings. No acute abnormality. Review of the MIP images confirms the above findings. IMPRESSION: 1. No evidence of pulmonary embolism. 2. Marked interval progression of lymphadenopathy within the chest and abdomen including mass-like high right paratracheal conglomerate adenopathy measuring up to 6.4 cm. 3. Interval development of innumerable small ill-defined low-density hepatic masses compatible with metastatic disease. 4. New irregular 7 mm pulmonary nodule at the posterior right lung apex, concerning for metastatic disease. 5. Mild circumferential thickening of the rectosigmoid  colon suggesting a nonspecific colitis. 6. Ascending thoracic aorta measures up to 4.0 cm in diameter. Recommend annual imaging followup by CTA or MRA. This recommendation follows 2010 ACCF/AHA/AATS/ACR/ASA/SCA/SCAI/SIR/STS/SVM Guidelines for the Diagnosis and Management of Patients with Thoracic Aortic Disease. Circulation. 2010; 121: O130-Q657. Aortic aneurysm NOS (ICD10-I71.9) 7. Aortic Atherosclerosis (ICD10-I70.0). Electronically Signed   By: Davina Poke D.O.   On: 09/19/2019 13:32   CT ABDOMEN PELVIS W CONTRAST  Result Date: 09/19/2019 CLINICAL DATA:  Abdominal pain, nausea, vomiting. Altered mental status, hypotension. History of esophageal cancer EXAM: CT ANGIOGRAPHY CHEST CT ABDOMEN AND PELVIS WITH CONTRAST TECHNIQUE: Multidetector CT imaging of the chest was performed using the standard protocol during bolus administration of intravenous contrast. Multiplanar CT image reconstructions and MIPs were obtained to evaluate the vascular anatomy. Multidetector CT imaging of the abdomen and pelvis was performed using the standard protocol during bolus administration of intravenous contrast. CONTRAST:  122mL OMNIPAQUE IOHEXOL 350 MG/ML SOLN COMPARISON:  PET-CT 09/04/2019.  CT abdomen pelvis 08/28/2019 FINDINGS: CTA CHEST  FINDINGS Cardiovascular: Pulmonary vasculature is adequately opacified. No filling defect to the segmental branch level to suggest pulmonary embolism. Heart size is within normal limits. Trace pericardial effusion. Ascending thoracic aorta measures 4.0 cm in diameter. Scattered atherosclerotic calcification of the thoracic aorta. Three vessel arch without evidence of hemodynamically significant stenosis of the arch vessels. Mediastinum/Nodes: Marked interval enlargement of masslike conglomerate adenopathy within the high right paratracheal region with heterogeneous mass measuring approximately 5.2 x 6.2 x 6.4 cm (series 8, image 22; series 11, image 77). On PET-CT 07/05/2019 there was a  enlarged lymph node at this location measuring 2.0 cm short axis. Enlarging bilateral supraclavicular adenopathy with reference node in the right supraclavicular region measuring 2.0 cm short axis (series 8, image 6) and left supraclavicular node measuring 1.9 cm (series 8, image 1). Multiple enlarging mediastinal lymph nodes including 1.6 cm precarinal node (series 8, image 42). Conglomerate masslike subcarinal adenopathy measuring approximately 4.2 x 2.9 cm (series 8, image 51). There are mildly enlarged bilateral hilar lymph nodes. No axillary lymphadenopathy. Circumferential masslike thickening of the distal esophagus is similar to prior CT 08/28/2019 but decreased from the previous PET-CT. Unremarkable thyroid. Trachea is deviated to the left from enlarged right paratracheal mass. Lungs/Pleura: New irregular 7 mm pulmonary nodule at the posterior right lung apex (series 10, image 16). No additional pulmonary nodules or masses. Bibasilar atelectasis. Lungs are otherwise clear. No pleural effusion or pneumothorax. Musculoskeletal: No suspicious osseous lesions.  No acute finding. Review of the MIP images confirms the above findings. CT ABDOMEN and PELVIS FINDINGS Hepatobiliary: Interval development of innumerable small ill-defined low-density hepatic masses compatible with metastatic disease. There is a small amount of layering sludge or small stones within a non dilated gallbladder. No biliary dilatation. Pancreas: Unremarkable. No pancreatic ductal dilatation or surrounding inflammatory changes. Spleen: Stable splenomegaly. Adrenals/Urinary Tract: Unremarkable adrenal glands. Small probable cyst within the inferior pole the left kidney. Punctate nonobstructing right renal calculus, unchanged. There is no hydronephrosis. Urinary bladder is unremarkable. Stomach/Bowel: Masslike thickening of the distal esophagus compatible with known esophageal carcinoma. Percutaneous gastrostomy tube is appropriately located  within the gastric body. No dilated loops of bowel. Mild circumferential thickening of the rectosigmoid colon suggesting a nonspecific colitis. Normal appendix in the right lower quadrant. Vascular/Lymphatic: Interval progression of abdominal and retroperitoneal lymphadenopathy including masslike conglomeration of left periaortic nodes now measuring 6.1 x 3.6 cm (series 5, image 35), previously measured 3.9 x 1.6 cm. Celiac node measures 2.3 cm short axis (series 5, image 27), new from prior. Masslike soft tissue density in the retroaortic space near the diaphragmatic hiatus (series 5, image 25), progressed from prior. No acute vascular findings. Scattered atherosclerosis. Reproductive: Prostate is stable in appearance. Other: No free air, free fluid, or abdominal wall hernia. Musculoskeletal: No suspicious osseous findings. No acute abnormality. Review of the MIP images confirms the above findings. IMPRESSION: 1. No evidence of pulmonary embolism. 2. Marked interval progression of lymphadenopathy within the chest and abdomen including mass-like high right paratracheal conglomerate adenopathy measuring up to 6.4 cm. 3. Interval development of innumerable small ill-defined low-density hepatic masses compatible with metastatic disease. 4. New irregular 7 mm pulmonary nodule at the posterior right lung apex, concerning for metastatic disease. 5. Mild circumferential thickening of the rectosigmoid colon suggesting a nonspecific colitis. 6. Ascending thoracic aorta measures up to 4.0 cm in diameter. Recommend annual imaging followup by CTA or MRA. This recommendation follows 2010 ACCF/AHA/AATS/ACR/ASA/SCA/SCAI/SIR/STS/SVM Guidelines for the Diagnosis and Management of Patients with Thoracic Aortic Disease.  Circulation. 2010; 121: [id number]. Aortic aneurysm NOS (ICD10-I71.9) 7. Aortic Atherosclerosis (ICD10-I70.0). Electronically Signed   By: Davina Poke D.O.   On: 09/19/2019 13:32    EKG:   Sinus tachycardia  107 bpm  IMPRESSION AND PLAN:   1.  Metastatic stage IV esophageal cancer.  As per spouse, the liver and lung lesions are new.  We will get palliative care consultation.  Patient made a DNR.  Overall prognosis is poor. 2.  Clinical sepsis unclear cause we will give Maxipime.  Given a dose of Flagyl and also vancomycin in the emergency room.  Follow-up cultures.  Urine analysis is not impressive. 3.  Lactic acidosis.  IV fluid hydration 4.  Hypokalemia we will give IV fluid hydration 5.  Acute metabolic encephalopathy with underlying dementia.  We will give Seroquel at night.  We will have to image the brain tomorrow after CT scan dye gets out of the patient's system. 6.  Acute kidney injury we will give IV fluid hydration 7.  Anxiety depression continue usual medications 8.  History of orthostatic hypotension.  Will hopefully be able to come off of the Florinef and use midodrine only. 9.  Patient is a DO NOT RESUSCITATE.   All the records are reviewed and case discussed with ED provider. Management plans discussed with the patient, family and they are in agreement.  Patient requires inpatient status.  Admitted with clinical sepsis and metastatic esophageal cancer.  CODE STATUS: dnr  TOTAL TIME TAKING CARE OF THIS PATIENT: 50 minutes.    Loletha Grayer M.D on 09/19/2019 at 2:48 PM  Between 7am to 6pm - Pager - [phone]  After 6pm call admission pager [phone]  [facility]  CC: Primary care physician; Leighton Ruff, MD

## 2019-09-19 NOTE — Progress Notes (Signed)
[facility] Liaison note:  Patient is currently followed by [facility] community Palliative program at [employer]. TOC Andreas Newport made aware. Flo Shanks BSN, RN, [facility] liaison [employer] [phone]

## 2019-09-19 NOTE — Sepsis Progress Note (Cosign Needed)
Notified bedside nurse of need to draw repeat lactic acid after completion of fluid resusitation.

## 2019-09-20 ENCOUNTER — Inpatient Hospital Stay: Payer: Medicare Other

## 2019-09-20 DIAGNOSIS — G9340 Encephalopathy, unspecified: Secondary | ICD-10-CM

## 2019-09-20 DIAGNOSIS — Z7189 Other specified counseling: Secondary | ICD-10-CM

## 2019-09-20 DIAGNOSIS — R6520 Severe sepsis without septic shock: Secondary | ICD-10-CM

## 2019-09-20 DIAGNOSIS — Z515 Encounter for palliative care: Secondary | ICD-10-CM

## 2019-09-20 DIAGNOSIS — N179 Acute kidney failure, unspecified: Secondary | ICD-10-CM

## 2019-09-20 DIAGNOSIS — R531 Weakness: Secondary | ICD-10-CM

## 2019-09-20 LAB — BASIC METABOLIC PANEL
Anion gap: 5 (ref 5–15)
BUN: 16 mg/dL (ref 8–23)
CO2: 23 mmol/L (ref 22–32)
Calcium: 8.6 mg/dL — ABNORMAL LOW (ref 8.9–10.3)
Chloride: 111 mmol/L (ref 98–111)
Creatinine, Ser: 0.97 mg/dL (ref 0.61–1.24)
GFR calc Af Amer: >60 mL/min (ref >60)
GFR calc non Af Amer: >60 mL/min (ref >60)
Glucose, Bld: 104 mg/dL — ABNORMAL HIGH (ref 70–99)
Potassium: 3.3 mmol/L — ABNORMAL LOW (ref 3.5–5.1)
Sodium: 139 mmol/L (ref 135–145)

## 2019-09-20 LAB — CBC
HCT: 31.9 % — ABNORMAL LOW (ref 39.0–52.0)
Hemoglobin: 10.6 g/dL — ABNORMAL LOW (ref 13.0–17.0)
MCH: 29.2 pg (ref 26.0–34.0)
MCHC: 33.2 g/dL (ref 30.0–36.0)
MCV: 87.9 fL (ref 80.0–100.0)
Platelets: 177 10*3/uL (ref 150–400)
RBC: 3.63 MIL/uL — ABNORMAL LOW (ref 4.22–5.81)
RDW: 15.9 % — ABNORMAL HIGH (ref 11.5–15.5)
WBC: 11.1 10*3/uL — ABNORMAL HIGH (ref 4.0–10.5)
nRBC: 0 % (ref 0.0–0.2)

## 2019-09-20 MED ORDER — PRO-STAT SUGAR FREE PO LIQD
30.0000 mL | Freq: Every day | ORAL | Status: DC
  Administered 2019-09-20 – 2019-09-22 (×3): 30 mL

## 2019-09-20 MED ORDER — MORPHINE SULFATE (CONCENTRATE) 10 MG/0.5ML PO SOLN
5.0000 mg | ORAL | Status: DC | PRN
  Administered 2019-09-20 – 2019-09-21 (×2): 5 mg via ORAL
  Filled 2019-09-20 (×2): qty 0.5

## 2019-09-20 MED ORDER — KATE FARMS STANDARD 1.4 PO LIQD
1680.0000 mL | ORAL | Status: DC
  Administered 2019-09-20: 21:00:00 1680 mL
  Filled 2019-09-20: qty 1950

## 2019-09-20 MED ORDER — FREE WATER
150.0000 mL | Status: DC
  Administered 2019-09-20 – 2019-09-22 (×10): 150 mL

## 2019-09-20 MED ORDER — GADOBUTROL 1 MMOL/ML IV SOLN
10.0000 mL | Freq: Once | INTRAVENOUS | Status: AC | PRN
  Administered 2019-09-20: 10 mL via INTRAVENOUS

## 2019-09-20 NOTE — Evaluation (Signed)
Physical Therapy Evaluation Patient Details Name: Trigg Delarocha Sianez MRN: [id number] DOB: [birth year] Today's Date: 09/20/2019   History of Present Illness  Pt admitted for cancer of esophagus with mets with c/o abnormal vitals. PMH included anxiety, dementia, HTN, depression, and cancer. Per chart review, pt currently living at [facility].  Clinical Impression  Pt is a pleasantly confused 74 y/o male admitted for cancer of esophagus with mets and abnormal vitals. During PT eval, HR monitored with exertion remained within normal limits. Pt is limited by confusion however follows limited commands for activity. Pt able to perform rolling with max A+2 with inability to fully attain sidelying. Due to weakness, OOB not performed this session for safety reasons. Able to perform limited supine therex however keeps eyes closed most of the time when not verbally stimulated. Pt is very high falls risk; multiple bruises noted throughout body. Would benefit from skilled therapy services to address above mentioned deficits. Currently recommending SNF placement, anticipating return to [facility]. Per chart review, possible transition to hospice facility, will continue to follow per recommendations.    Follow Up Recommendations SNF    Equipment Recommendations       Recommendations for Other Services       Precautions / Restrictions Precautions Precautions: Fall Restrictions Weight Bearing Restrictions: No      Mobility  Bed Mobility Overal bed mobility: Needs Assistance Bed Mobility: Rolling Rolling: Max assist         General bed mobility comments: Bilateral rolling with difficulty with BLE, needs facilitation for performance, Able to reach railing with hand but inconsistent; unable to fully attain sidelying position. Takes +2 for scooting toward HOB and repositioning.  Transfers                 General transfer comment: Not attempted today.  Ambulation/Gait                 Stairs            Wheelchair Mobility    Modified Rankin (Stroke Patients Only)       Balance Overall balance assessment: History of Falls;Needs assistance                                           Pertinent Vitals/Pain Pain Assessment: 0-10 Pain Score: 5  Pain Location: bilateral thighs Pain Descriptors / Indicators: Discomfort Pain Intervention(s): Repositioned;Limited activity within patient's tolerance    Home Living Family/patient expects to be discharged to:: Skilled nursing facility                      Prior Function Level of Independence: Needs assistance         Comments: Pt is poor historian, most of info obtained from chart. Per charp, pt currently at [facility]. Per pt, he has had multiple falls indicating to bruising on bilateral LEs.      Hand Dominance        Extremity/Trunk Assessment   Upper Extremity Assessment Upper Extremity Assessment: Generalized weakness (BUE grossly 4/5)    Lower Extremity Assessment Lower Extremity Assessment: Generalized weakness (BLE grossly 3+/5)       Communication   Communication: No difficulties  Cognition Arousal/Alertness: Lethargic (kept eyes closed most of session unless verbally stimulated.) Behavior During Therapy: WFL for tasks assessed/performed Overall Cognitive Status: History of cognitive impairments - at baseline  General Comments: Hx of dementia, only oriented to self, easily redirectable. Follows commands.      General Comments      Exercises Other Exercises Other Exercises: Performed supine BLE therex, including: quat sets, SLR, glute sets, heel slides. All therex performed x 5-10 reps with supervision and tactile cues for facilitation.   Assessment/Plan    PT Assessment Patient needs continued PT services  PT Problem List Decreased strength;Decreased activity tolerance;Decreased balance;Decreased  mobility;Decreased cognition;Decreased knowledge of use of DME;Decreased safety awareness;Pain       PT Treatment Interventions DME instruction;Gait training;Therapeutic activities;Therapeutic exercise;Balance training    PT Goals (Current goals can be found in the Care Plan section)  Acute Rehab PT Goals Patient Stated Goal:  (Pt unable to state goal at this time.) PT Goal Formulation: Patient unable to participate in goal setting Time For Goal Achievement: 10/04/19 Potential to Achieve Goals: Good    Frequency Min 2X/week   Barriers to discharge        Co-evaluation               AM-PAC PT "6 Clicks" Mobility  Outcome Measure Help needed turning from your back to your side while in a flat bed without using bedrails?: A Lot Help needed moving from lying on your back to sitting on the side of a flat bed without using bedrails?: A Lot Help needed moving to and from a bed to a chair (including a wheelchair)?: Total Help needed standing up from a chair using your arms (e.g., wheelchair or bedside chair)?: Total Help needed to walk in hospital room?: Total Help needed climbing 3-5 steps with a railing? : Total 6 Click Score: 8    End of Session   Activity Tolerance: Patient limited by lethargy Patient left: in bed;with call bell/phone within reach;with bed alarm set Nurse Communication: Mobility status PT Visit Diagnosis: Unsteadiness on feet (R26.81);History of falling (Z91.81);Muscle weakness (generalized) (M62.81);Difficulty in walking, not elsewhere classified (R26.2);Pain Pain - Right/Left:  (Both) Pain - part of body:  (thighs)    Time: 6314-9702 PT Time Calculation (min) (ACUTE ONLY): 17 min   Charges:              Vale Haven, SPT  Vale Haven 09/20/2019, 4:36 PM

## 2019-09-20 NOTE — Progress Notes (Signed)
Initial Nutrition Assessment  DOCUMENTATION CODES:   Not applicable  INTERVENTION:  Initiate [place] 1.4 @ 20 ml/hr, advance 10 ml every 8 hours to goal rate 70 ml/hr, with 30 ml prostat daily via PEG, 150 ml free water flush every 4 hours  325 ml bottles will need to be poured into a bag for continuous feedings. Bag will need to be changed every 8 hours.   -Provides 2452 kcal, 119 grams of protein, and 1210 ml free water (2110 ml total H20 with flushes)  Monitor magnesium, potassium, and phosphorus daily for at least 3 days, MD to replete as needed, as pt is at risk for refeeding syndrome given poor toleration of tube feedings at facility per wife report, hypokalemia per labs.   NUTRITION DIAGNOSIS:   Increased nutrient needs related to cancer and cancer related treatments as evidenced by estimated needs.   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 Past medical history of PEG placement, dementia, HLD, HTN, narcissistic personality disorder, sleep apnea on CPAP admitted with sepsis.  Patient sleeping soundly at visit this morning, no family in room. Patient seen by SLP, recommendation for NPO. Per chart, patient was last seen by nutrition department on 5/27 by RD at [facility] during admission. Patient was tolerating Dillard [place] 1.4 @ 40 ml/hr at that time as well as some po intake of [employer] supplement. Spoke with patient's wife via phone, she reports patient having nausea and feeling full when at goal at facility and interested in trying a different formula. Patient was started on Osmolite  1.5 s/p PEG placement on 07/07/19 and unable to tolerate per wife. Patient was switched to [facility] 1.4 in order to offer patient a formula free of additives and corn-based on 07/22/19. RD explained that Dillard Essex is the only organic tube feeding formula available. Wife amenable to continuing with Dillard Essex at this time. Will advance slowly and monitor for toleration.   Mild pitting generalized edema per RN assessment. No new weights this admission, on 09/10/19 he weighed 222 lbs and have remained fairly stable over the past 3 months. Recommend obtaining current weight to assess trends.  Medications reviewed and include:  IVF: NaCl with KCl 20 mEq/L @ 75 ml/hr IVPB: Maxipime Labs: K 3.3 (L)  NUTRITION - FOCUSED PHYSICAL EXAM: Unable to complete at this time, RD working remotely.   Diet Order:   Diet Order            Diet NPO time specified  Diet effective now                 EDUCATION NEEDS:   No education needs have been identified at this time  Skin:  Skin Assessment: Reviewed RN Assessment  Last BM:  6/11 type 5  Height:   Ht Readings from Last 1 Encounters:  09/19/19 6\' 2"  (1.88 m)    Weight:   Wt Readings from Last 1 Encounters:  09/10/19 101 kg    BMI:  Body mass index is 28.59 kg/m.  Estimated Nutritional Needs:   Kcal:  2725-3664  Protein:  115-130  Fluid:  2.3 L/day   Lajuan Lines, RD, LDN Clinical Nutrition After Hours/Weekend Pager # in [place]

## 2019-09-20 NOTE — Progress Notes (Signed)
[facility]) Hospice Liaison RN note:  Received request from Kathie Rhodes, NP and Telford Nab, [facility] for family interest in [facility]. Chart reviewed and patient eligibility approved.  Spoke with spouse, [name] to acknowledge referral. No questions at present.  Unfortunately, [facility] does not have a room to offer today. [name] and TOC aware. [facility] Liaison will follow up when room becomes available.   Please call with any Hospice related questions or concerns.  Thank you for the opportunity to participate in this patient's care.  Zandra Abts, RN [facility] Liaison [phone]

## 2019-09-20 NOTE — Consult Note (Addendum)
Consultation Note Date: 09/20/2019   Patient Name: David [name]  DOB: 19-Jun-1945  MRN: [id number]  Age / Sex: 74 y.o., male  PCP: Leighton Ruff, MD Referring Physician: Loletha Grayer, MD  Reason for Consultation: Establishing goals of care  HPI/Patient Profile: 74 y.o. male  with past medical history of metastatic stage IV esophageal cancer, dementia, COPD, CKD, OSA, anxiety, and depression admitted on 09/19/2019 with tachycardia and hypotension. CT scan revealed new lung and liver lesions.  Patient is being treated for sepsis of unclear source. Patient does have PEG, he had swallow eval 6/11 and continues to be high aspiration risk. Patient with 5 hospitalizations and 3 ED visits in last 6 months. PMT consulted for [place].   Clinical Assessment and Goals of Care: I have reviewed medical records including EPIC notes, labs and imaging, assessed the patient and then spoke with patient's wife David [name]  to discuss diagnosis prognosis, [place], EOL wishes, disposition and options.  I met David [name] but he remains very confused - tells me we are in [facility]. Tell me "my abdomen hurts because of my abdomen".  I called patient's wife David [name] to further discuss goals of care.   I introduced Palliative Medicine as specialized medical care for people living with serious illness. It focuses on providing relief from the symptoms and stress of a serious illness. The goal is to improve quality of life for both the patient and the family.  David [name] shares that she and the patient have been separated for 9 years but she does look out for him. They have a 46 year old daughter with autism and a son - David [name] has custody of her son's children. She shares how overwhelming this situation is.   David [name] tells me she was able to speak with patient's oncologist this morning. She understands his cancer is progressing. She tells me they spoke about chemotherapy but they both  agreed this would not be a good option for David [name].   David [name] tells me about David [name]'s worsening function and progressive confusion.    We discussed patient's current illness and what it means in the larger context of patient's on-going co-morbidities.  Natural disease trajectory and expectations at EOL were discussed. David [name] understands patient is nearing end of life.   The difference between aggressive medical intervention and comfort care was considered in light of the patient's goals of care. David [name] is interested in transitioning to focus more of David [name]'s comfort instead of continued aggressive interventions or rehabilitation.   Hospice services outpatient were explained and offered. David [name] is interested in hospice facility in [facility].   Questions and concerns were addressed. The family was encouraged to call with questions or concerns.   Primary Decision Maker NEXT OF KIN - wife David [name] [place]   SUMMARY OF RECOMMENDATIONS   - family interested in [place] referral and hospice liaison notified - continue current measures until bed available at hospice facility - discussed plan with oncologist who agrees - will add low dose roxanol as patient c/o abd pain  Code Status/Advance Care Planning:  DNR  Discharge Planning: [facility]     Primary Diagnoses: Present on Admission: . Metastatic cancer ([place])   I have reviewed the medical record, interviewed the patient and family, and examined the patient. The following aspects are pertinent.  Past Medical History:  Diagnosis Date  . Anxiety   . BPH (benign prostatic hyperplasia)   . Cancer ([place])   . Dementia (  [place])   . Depression   . Hyperlipidemia   . Hypertension   . Kidney stone   . Memory loss   . Narcissistic personality disorder ([place]) 08/14/2014  . Sleep apnea    CPAP  . Vitamin D deficiency    Social History   Socioeconomic History  . Marital status: Legally  Separated    Spouse name: David [name]  . Number of children: 2  . Years of education: 41  . 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 Smoking status: Never Smoker  . Smokeless tobacco: Never Used  Vaping Use  . Vaping Use: Never used  Substance and Sexual Activity  . Alcohol use: Not Currently    Alcohol/week: 1.0 - 2.0 standard drink    Types: 1 - 2 Shots of liquor per week    Comment: 1-2 shots 4-5 x week  . Drug use: Not Currently    Types: Marijuana    Comment: Occasional  . Sexual activity: Not Currently  Other Topics Concern  . Not on file  Social History Narrative   Lives alone retired.  Education: BS, MS Phd chemist.  Separated 8 yrs a/o spring 2020.  Adult children son, out of home, and daughter, adult with autistic spectrum, lives with estranged wife.  2 gks.  Mother died at age 5, of COVID, in the [place] outbreak -- emotionally destabilizing event late May/early June.        Cokes 0-3 daily as reported by nursing Sept 2019.  Gets medications via blister pack prepared by pharmacy a/o spring 2020, with generally good compliance but some mixing up of times and dates.   Social Determinants of Health   Financial Resource Strain:   . Difficulty of Paying Living Expenses:   Food Insecurity:   . Worried About Charity fundraiser in the Last Year:   . Arboriculturist in the Last Year:   Transportation Needs:   . Film/video editor (Medical):   Marland Kitchen Lack of Transportation (Non-Medical):   Physical Activity:   . Days of Exercise per Week:   . Minutes of Exercise per Session:   Stress:   . Feeling of Stress :   Social Connections:   . Frequency of Communication with Friends and Family:   . Frequency of Social Gatherings with Friends and Family:   . Attends Religious Services:   . Active Member of Clubs or Organizations:   . Attends Archivist Meetings:   Marland Kitchen Marital Status:    Family History    Problem Relation Age of Onset  . Alzheimer's disease Mother   . Depression Mother   . Cancer Father        prostate  . Thyroid disease Father   . Alzheimer's disease Maternal Grandmother   . Heart disease Maternal Grandfather   . Stroke Paternal Grandmother   . Stroke Paternal Grandfather   . Alcoholism Brother   . Autism Daughter   . Autism Maternal Uncle    Scheduled Meds: . buPROPion  150 mg Oral Daily  . Droxidopa  100 mg Gastrostomy Tube TID  . enoxaparin (LOVENOX) injection  40 mg Subcutaneous Q24H  . escitalopram  20 mg Per Tube Daily  . feeding supplement (PRO-STAT SUGAR FREE 64)  30 mL Per Tube Daily  . free water  150 mL Per Tube Q4H  . midodrine  10 mg Per Tube TID WC  .  pantoprazole  40 mg Oral Daily  . sucralfate  1 g Per Tube TID WC & HS  . tamsulosin  0.4 mg Oral QHS   Continuous Infusions: . 0.9 % NaCl with KCl 20 mEq / L 75 mL/hr at 09/19/19 1548  . ceFEPime (MAXIPIME) IV 2 g (09/20/19 1610)  . feeding supplement ([place] STANDARD 1.4)     PRN Meds:.acetaminophen **OR** acetaminophen, gadobutrol, LORazepam, ondansetron **OR** ondansetron (ZOFRAN) IV Allergies  Allergen Reactions  . Other     Other reaction(s): Other Sleep walk   . Abilify [Aripiprazole] Other (See Comments)    Short term memory loss  . Lamictal [Lamotrigine]     Increased agitation   Review of Systems  Unable to perform ROS: Dementia    Physical Exam Constitutional:      General: He is not in acute distress.    Comments: lethargic  Pulmonary:     Effort: Pulmonary effort is normal.  Skin:    General: Skin is warm and dry.  Neurological:     Mental Status: He is disoriented.     Vital Signs: BP 129/79 (BP Location: Right Arm)   Pulse 82   Temp 98.4 F (36.9 C) (Axillary)   Resp 18   Ht '6\' 2"'  (1.88 m)   SpO2 100%   BMI 28.59 kg/m  Pain Scale: 0-10   Pain Score: 0-No pain   SpO2: SpO2: 100 % O2 Device:SpO2: 100 % O2 Flow Rate: .   IO: Intake/output  summary:   Intake/Output Summary (Last 24 hours) at 09/20/2019 1403 Last data filed at 09/20/2019 1110 Gross per 24 hour  Intake 1000 ml  Output 675 ml  Net 325 ml    LBM:   Baseline Weight:   Most recent weight:       Palliative Assessment/Data: PPS 30%     Time Total: 75 minutes Greater than 50%  of this time was spent counseling and coordinating care related to the above assessment and plan.  Juel Burrow, DNP, AGNP-C Palliative Medicine Team [phone] Pager: [phone]

## 2019-09-20 NOTE — Evaluation (Signed)
Clinical/Bedside Swallow Evaluation Patient Details  Name: David [name] MRN: [id number] Date of Birth: 1945-12-12  Today's Date: 09/20/2019 Time: SLP Start Time (ACUTE ONLY): 0816 SLP Stop Time (ACUTE ONLY): 0835 SLP Time Calculation (min) (ACUTE ONLY): 19 min  Past Medical History:  Past Medical History:  Diagnosis Date  . Anxiety   . BPH (benign prostatic hyperplasia)   . Cancer ([place])   . Dementia ([place])   . Depression   . Hyperlipidemia   . Hypertension   . Kidney stone   . Memory loss   . Narcissistic personality disorder ([place]) 08/14/2014  . Sleep apnea    CPAP  . Vitamin D deficiency    Past Surgical History:  Past Surgical History:  Procedure Laterality Date  . BIOPSY  08/31/2019   Procedure: BIOPSY;  Surgeon: Wonda Horner, MD;  Location: [facility];  Service: Endoscopy;;  . CATARACT EXTRACTION, BILATERAL  2018  . ESOPHAGOGASTRODUODENOSCOPY N/A 08/31/2019   Procedure: ESOPHAGOGASTRODUODENOSCOPY (EGD);  Surgeon: Wonda Horner, MD;  Location: [facility];  Service: Endoscopy;  Laterality: N/A;  . EXTERNAL EAR SURGERY     child  . EYE SURGERY Bilateral    cataract  . IR GASTROSTOMY TUBE MOD SED  07/08/2019  . right knee meniscus  06/12/2008  . right talus repair     dislocated   HPI:  74yo male admitted 08/25/19 with confusion, fever, and weakness. PMH: anxiety, HLD, HTN, kidney stone, narcissistic personality disorder, OSA, esophageal cancer, PEG tube, BPH, dementia, depression. Pt admitted for confusion on 09/19/2019 with MRI revealing no evidence of pulmonary embolism, marked interval progression of lymphadenopathy within the chest and abdomen including mass-like high right paratracheal conglomerate adenopathy measuring up to 6.4 cm, interval development of innumerable small ill-defined low-density hepatic masses compatible with metastatic disease, new irregular 7 mm pulmonary nodule at the posterior right lung apex, concerning for metastatic disease and mild  circumferential thickening of the rectosigmoid colon suggesting a nonspecific colitis.    Assessment / Plan / Recommendation Clinical Impression  Pt with history of high risk of aspiration poost-prandially secondary to esophaegeal dysfunction. Pt with recent Bedside Swallow Evaluations ( 07/06/2019, 08/30/2019) that document appearance of functional oropharyngeal abilities and pt c/o of esophageal symptoms. During pt's bedside assessment today, he presents with significant s/s of aspiration that places him at a  very high risk of aspiration. He has multiple swallows (3 to 4) per single bolus of thin liquids and has severe coughing post swallow. Suspect an oral component, poor timing of swallow and delay swallow but will confirm with an instrumental study. At this time, recommend strict NPO with Modified Barium Swallow Study scheduled for today.  SLP Visit Diagnosis: Dysphagia, unspecified (R13.10)    Aspiration Risk  Severe aspiration risk;Risk for inadequate nutrition/hydration    Diet Recommendation   NPO  Medication Administration: Via alternative means    Other  Recommendations Oral Care Recommendations: Oral care QID Other Recommendations: Remove water pitcher   Follow up Recommendations  (TBD)      Frequency and Duration   TBD         Prognosis   TBD     Swallow Study   General Date of Onset: 09/19/19 HPI: 74yo male admitted 08/25/19 with confusion, fever, and weakness. PMH: anxiety, HLD, HTN, kidney stone, narcissistic personality disorder, OSA, esophageal cancer, PEG tube, BPH, dementia, depression. Pt admitted for confusion on 09/19/2019 with MRI revealing no evidence of pulmonary embolism, marked interval progression of lymphadenopathy within the chest and  abdomen including mass-like high right paratracheal conglomerate adenopathy measuring up to 6.4 cm, interval development of innumerable small ill-defined low-density hepatic masses compatible with metastatic disease, new  irregular 7 mm pulmonary nodule at the posterior right lung apex, concerning for metastatic disease and mild circumferential thickening of the rectosigmoid colon suggesting a nonspecific colitis.  Type of Study: Bedside Swallow Evaluation Previous Swallow Assessment: multiple previous no oropharyngeal dysphagia noted Diet Prior to this Study: NPO Temperature Spikes Noted: No Respiratory Status: Room air History of Recent Intubation: No Behavior/Cognition: Alert;Cooperative;Pleasant mood;Requires cueing;Doesn't follow directions;Distractible Oral Cavity Assessment: Dried secretions Oral Care Completed by SLP: Yes Oral Cavity - Dentition: Adequate natural dentition Vision: Functional for self-feeding Self-Feeding Abilities: Able to feed self Patient Positioning: Upright in bed Baseline Vocal Quality: Normal Volitional Cough: Strong Volitional Swallow: Able to elicit    Oral/Motor/Sensory Function Overall Oral Motor/Sensory Function: Within functional limits   Ice Chips Ice chips: Not tested   Thin Liquid Thin Liquid: Impaired Presentation: Cup;Straw Pharyngeal  Phase Impairments: Suspected delayed Swallow;Multiple swallows;Cough - Immediate (severe coughing)    Nectar Thick Nectar Thick Liquid: Not tested   Honey Thick Honey Thick Liquid: Not tested   Puree Puree: Not tested   Solid     Solid: Not tested     David [name] B. Rutherford Nail, M.S., CCC-SLP, [place] Pathologist Rehabilitation Services Office [phone]  Daemon Dowty 09/20/2019,10:39 AM

## 2019-09-20 NOTE — Consult Note (Signed)
Pharmacy Antibiotic Note  David [name] is a 74 y.o. male admitted on 09/19/2019 with  sepsis.  Pharmacy has been consulted for Cefepime dosing.  Plan: Continue Cefepime 2g IV every 8 hours   Height: 6\' 2"  (188 cm) IBW/kg (Calculated) : 82.2  Temp (24hrs), Avg:97.8 F (36.6 C), Min:97.3 F (36.3 C), Max:98.2 F (36.8 C)  Recent Labs  Lab 09/17/19 0906 09/19/19 1109 09/19/19 1417 09/20/19 0523  WBC 14.3* 16.6*  --  11.1*  CREATININE 1.00 1.38*  --  0.97  LATICACIDVEN  --  4.2* 1.4  --     Estimated Creatinine Clearance: 87.3 mL/min (by C-G formula based on SCr of 0.97 mg/dL).    Allergies  Allergen Reactions  . Other     Other reaction(s): Other Sleep walk   . Abilify [Aripiprazole] Other (See Comments)    Short term memory loss  . Lamictal [Lamotrigine]     Increased agitation    Antimicrobials this admission: 6/10 Cefepime >>   Microbiology results: 6/10 BCx: pending  6/10 UCx: pending   Thank you for allowing pharmacy to be a part of this patient's care.  Lu Duffel, PharmD, BCPS Clinical Pharmacist 09/20/2019 9:00 AM

## 2019-09-20 NOTE — Progress Notes (Signed)
Modified Barium Swallow Progress Note  Patient Details  Name: David [name] MRN: [id number] Date of Birth: [birth year]  Today's Date: 09/20/2019  Modified Barium Swallow completed.  Full report located under Chart Review in the Imaging Section.  Brief recommendations include the following:  Clinical Impression  Pt has severe sensorimotor oropharyngeal dysphagia that is further complicated by pt's altered mental status.  Pt has severe oral phase deficits that are c/b decreased weak lingual manipulation;ation, decreased bolus cohesion, premature spillage and piecemeal swallowing across all consistencies assessed (thin liquids, nectar thick liquids and puree). Pt's pharyngeal deficits included delayed swallow initiation to the vallecula and pyriform sinuses with moderate pharyngeal residue post swallow in the vallecula with mild residue in the pyriform sinuses.  Pt volitional produced multiple swallows per bolus but was not able to decrease pharyngeal residue. With a spoon bolus, very little of the bolus enters the esophagus leaving a large amount of residue in the vallecula and mild amounts of residue in the pyriform sinuses. Heavier weight of puree help with UES opening. When consuming thin liquids pt with penetration during the swallow and eventual aspiration. While pt has a significant cough response, it is now productive in expelling the aspirates. While penetration or aspiration is not observed with puree or nectar thick liquids however pt continues with risk of aspirating pharyngeal residue. Pt's confusion appeared exacerbated by unfamiliar setting in radiology suite. He was perseverative, difficult to redirect, restless, irritable, unable to follow directions; therefore he was not able to engage in any compensatory strategies and trials were moderately limited d/t his increased altered state. Pt has pre-existing risk of post-prandial aspiration and high risk of aspirating d/t oropharyngeal  dysphagia. As such, recommend NPO status. If pt's GOC include PO intake with known aspiration risk he should have extensive oral care prior and post PO intake, ice chips following oral care pose a reduced risk in general.    Swallow Evaluation Recommendations       SLP Diet Recommendations: NPO       Medication Administration: Via alternative means         Other Recommendations: Remove water pitcher  Cherity Blickenstaff B. Rutherford Nail M.S., CCC-SLP, [place] [facility] [phone]  Latoi Giraldo Rutherford Nail 09/20/2019,2:28 PM

## 2019-09-20 NOTE — Progress Notes (Signed)
Patient ID: David [name], male   DOB: [birth year], 74 y.o.   MRN: [id number] [facility] PROGRESS NOTE  Eithan Beagle [place] IOX:[id number] DOB: 1945/07/12 DOA: 09/19/2019 PCP: Leighton Ruff, MD  HPI/Subjective: Patient more alert than yesterday. Asking for something cold to drink. Did not do well with the swallow evaluation today. Some cough after trying to swallow. No pain.  Objective: Vitals:   09/20/19 0824 09/20/19 1244  BP: 121/63 129/79  Pulse: 68 82  Resp: 19 18  Temp: (!) 97.3 F (36.3 C) 98.4 F (36.9 C)  SpO2: 100% 100%    Intake/Output Summary (Last 24 hours) at 09/20/2019 1307 Last data filed at 09/20/2019 1110 Gross per 24 hour  Intake 3019 ml  Output 675 ml  Net 2344 ml   There were no vitals filed for this visit.  ROS: Review of Systems  Constitutional: Negative for fever.  Eyes: Negative for blurred vision.  Respiratory: Negative for cough and shortness of breath.   Cardiovascular: Negative for chest pain.  Gastrointestinal: Negative for abdominal pain, constipation, diarrhea, nausea and vomiting.  Genitourinary: Negative for dysuria.  Musculoskeletal: Negative for joint pain.  Neurological: Negative for dizziness and headaches.   Exam: Physical Exam  HENT:  Nose: No mucosal edema.  Mouth/Throat: No oropharyngeal exudate.  Eyes: Pupils are equal, round, and reactive to light. Conjunctivae and lids are normal.  Neck: Carotid bruit is not present.  Cardiovascular: S1 normal and S2 normal. Exam reveals no gallop.  No murmur heard. Respiratory: No respiratory distress. He has no wheezes. He has no rhonchi. He has no rales.  GI: Soft. Bowel sounds are normal. There is no abdominal tenderness.  Musculoskeletal:     Right shoulder: No swelling.  Neurological: He is alert. No cranial nerve deficit.  Skin: Skin is warm. Nails show no clubbing.      Data Reviewed: Basic Metabolic Panel: Recent Labs  Lab 09/17/19 0906 09/19/19 1109  09/20/19 0523  NA 136 135 139  K 3.6 3.5 3.3*  CL 107 106 111  CO2 15* 20* 23  GLUCOSE 132* 124* 104*  BUN 13 22 16   CREATININE 1.00 1.38* 0.97  CALCIUM 10.9* 9.9 8.6*   Liver Function Tests: Recent Labs  Lab 09/17/19 0906 09/19/19 1109  AST 21 29  ALT 22 26  ALKPHOS 89 91  BILITOT 1.2 1.2  PROT 6.8 6.5  ALBUMIN 3.3* 3.2*   CBC: Recent Labs  Lab 09/17/19 0906 09/19/19 1109 09/20/19 0523  WBC 14.3* 16.6* 11.1*  NEUTROABS 12.0* 15.1*  --   HGB 13.1 11.9* 10.6*  HCT 39.5 35.0* 31.9*  MCV 86.4 85.8 87.9  PLT 312 235 177     Recent Results (from the past 240 hour(s))  Culture, blood (Routine x 2)     Status: None (Preliminary result)   Collection Time: 09/19/19 11:09 AM   Specimen: BLOOD  Result Value Ref Range Status   Specimen Description BLOOD RIGHT AC  Final   Special Requests   Final    BOTTLES DRAWN AEROBIC AND ANAEROBIC Blood Culture results may not be optimal due to an inadequate volume of blood received in culture bottles   Culture   Final    NO GROWTH < 24 HOURS Performed at [facility], [place] [place], [place] [address]    Report Status PENDING  Incomplete  Culture, blood (Routine x 2)     Status: None (Preliminary result)   Collection Time: 09/19/19 11:40 AM   Specimen: BLOOD LEFT  WRIST  Result Value Ref Range Status   Specimen Description BLOOD LEFT WRIST  Final   Special Requests   Final    BOTTLES DRAWN AEROBIC AND ANAEROBIC Blood Culture adequate volume   Culture   Final    NO GROWTH < 24 HOURS Performed at [facility], [address] [place], [place] [address]    Report Status PENDING  Incomplete     Studies: DG Chest 2 View  Result Date: 09/19/2019 CLINICAL DATA:  Possible sepsis. EXAM: CHEST - 2 VIEW COMPARISON:  Chest x-ray dated Aug 31, 2019. FINDINGS: Normal heart size. Unchanged right paratracheal lymphadenopathy with leftward deviation of the trachea. Normal pulmonary vascularity. Minimal bibasilar  atelectasis. No focal consolidation, pleural effusion, or pneumothorax. No acute osseous abnormality. IMPRESSION: 1. Minimal bibasilar atelectasis. 2. Unchanged right paratracheal lymphadenopathy. Electronically Signed   By: Titus Dubin M.D.   On: 09/19/2019 12:00   CT Angio Chest PE W and/or Wo Contrast  Result Date: 09/19/2019 CLINICAL DATA:  Abdominal pain, nausea, vomiting. Altered mental status, hypotension. History of esophageal cancer EXAM: CT ANGIOGRAPHY CHEST CT ABDOMEN AND PELVIS WITH CONTRAST TECHNIQUE: Multidetector CT imaging of the chest was performed using the standard protocol during bolus administration of intravenous contrast. Multiplanar CT image reconstructions and MIPs were obtained to evaluate the vascular anatomy. Multidetector CT imaging of the abdomen and pelvis was performed using the standard protocol during bolus administration of intravenous contrast. CONTRAST:  142mL OMNIPAQUE IOHEXOL 350 MG/ML SOLN COMPARISON:  PET-CT 09/04/2019.  CT abdomen pelvis 08/28/2019 FINDINGS: CTA CHEST FINDINGS Cardiovascular: Pulmonary vasculature is adequately opacified. No filling defect to the segmental branch level to suggest pulmonary embolism. Heart size is within normal limits. Trace pericardial effusion. Ascending thoracic aorta measures 4.0 cm in diameter. Scattered atherosclerotic calcification of the thoracic aorta. Three vessel arch without evidence of hemodynamically significant stenosis of the arch vessels. Mediastinum/Nodes: Marked interval enlargement of masslike conglomerate adenopathy within the high right paratracheal region with heterogeneous mass measuring approximately 5.2 x 6.2 x 6.4 cm (series 8, image 22; series 11, image 77). On PET-CT 07/05/2019 there was a enlarged lymph node at this location measuring 2.0 cm short axis. Enlarging bilateral supraclavicular adenopathy with reference node in the right supraclavicular region measuring 2.0 cm short axis (series 8, image 6) and  left supraclavicular node measuring 1.9 cm (series 8, image 1). Multiple enlarging mediastinal lymph nodes including 1.6 cm precarinal node (series 8, image 42). Conglomerate masslike subcarinal adenopathy measuring approximately 4.2 x 2.9 cm (series 8, image 51). There are mildly enlarged bilateral hilar lymph nodes. No axillary lymphadenopathy. Circumferential masslike thickening of the distal esophagus is similar to prior CT 08/28/2019 but decreased from the previous PET-CT. Unremarkable thyroid. Trachea is deviated to the left from enlarged right paratracheal mass. Lungs/Pleura: New irregular 7 mm pulmonary nodule at the posterior right lung apex (series 10, image 16). No additional pulmonary nodules or masses. Bibasilar atelectasis. Lungs are otherwise clear. No pleural effusion or pneumothorax. Musculoskeletal: No suspicious osseous lesions.  No acute finding. Review of the MIP images confirms the above findings. CT ABDOMEN and PELVIS FINDINGS Hepatobiliary: Interval development of innumerable small ill-defined low-density hepatic masses compatible with metastatic disease. There is a small amount of layering sludge or small stones within a non dilated gallbladder. No biliary dilatation. Pancreas: Unremarkable. No pancreatic ductal dilatation or surrounding inflammatory changes. Spleen: Stable splenomegaly. Adrenals/Urinary Tract: Unremarkable adrenal glands. Small probable cyst within the inferior pole the left kidney. Punctate nonobstructing right renal calculus,  unchanged. There is no hydronephrosis. Urinary bladder is unremarkable. Stomach/Bowel: Masslike thickening of the distal esophagus compatible with known esophageal carcinoma. Percutaneous gastrostomy tube is appropriately located within the gastric body. No dilated loops of bowel. Mild circumferential thickening of the rectosigmoid colon suggesting a nonspecific colitis. Normal appendix in the right lower quadrant. Vascular/Lymphatic: Interval  progression of abdominal and retroperitoneal lymphadenopathy including masslike conglomeration of left periaortic nodes now measuring 6.1 x 3.6 cm (series 5, image 35), previously measured 3.9 x 1.6 cm. Celiac node measures 2.3 cm short axis (series 5, image 27), new from prior. Masslike soft tissue density in the retroaortic space near the diaphragmatic hiatus (series 5, image 25), progressed from prior. No acute vascular findings. Scattered atherosclerosis. Reproductive: Prostate is stable in appearance. Other: No free air, free fluid, or abdominal wall hernia. Musculoskeletal: No suspicious osseous findings. No acute abnormality. Review of the MIP images confirms the above findings. IMPRESSION: 1. No evidence of pulmonary embolism. 2. Marked interval progression of lymphadenopathy within the chest and abdomen including mass-like high right paratracheal conglomerate adenopathy measuring up to 6.4 cm. 3. Interval development of innumerable small ill-defined low-density hepatic masses compatible with metastatic disease. 4. New irregular 7 mm pulmonary nodule at the posterior right lung apex, concerning for metastatic disease. 5. Mild circumferential thickening of the rectosigmoid colon suggesting a nonspecific colitis. 6. Ascending thoracic aorta measures up to 4.0 cm in diameter. Recommend annual imaging followup by CTA or MRA. This recommendation follows 2010 ACCF/AHA/AATS/ACR/ASA/SCA/SCAI/SIR/STS/SVM Guidelines for the Diagnosis and Management of Patients with Thoracic Aortic Disease. Circulation. 2010; 121: V672-C947. Aortic aneurysm NOS (ICD10-I71.9) 7. Aortic Atherosclerosis (ICD10-I70.0). Electronically Signed   By: Davina Poke D.O.   On: 09/19/2019 13:32   CT ABDOMEN PELVIS W CONTRAST  Result Date: 09/19/2019 CLINICAL DATA:  Abdominal pain, nausea, vomiting. Altered mental status, hypotension. History of esophageal cancer EXAM: CT ANGIOGRAPHY CHEST CT ABDOMEN AND PELVIS WITH CONTRAST TECHNIQUE:  Multidetector CT imaging of the chest was performed using the standard protocol during bolus administration of intravenous contrast. Multiplanar CT image reconstructions and MIPs were obtained to evaluate the vascular anatomy. Multidetector CT imaging of the abdomen and pelvis was performed using the standard protocol during bolus administration of intravenous contrast. CONTRAST:  175mL OMNIPAQUE IOHEXOL 350 MG/ML SOLN COMPARISON:  PET-CT 09/04/2019.  CT abdomen pelvis 08/28/2019 FINDINGS: CTA CHEST FINDINGS Cardiovascular: Pulmonary vasculature is adequately opacified. No filling defect to the segmental branch level to suggest pulmonary embolism. Heart size is within normal limits. Trace pericardial effusion. Ascending thoracic aorta measures 4.0 cm in diameter. Scattered atherosclerotic calcification of the thoracic aorta. Three vessel arch without evidence of hemodynamically significant stenosis of the arch vessels. Mediastinum/Nodes: Marked interval enlargement of masslike conglomerate adenopathy within the high right paratracheal region with heterogeneous mass measuring approximately 5.2 x 6.2 x 6.4 cm (series 8, image 22; series 11, image 77). On PET-CT 07/05/2019 there was a enlarged lymph node at this location measuring 2.0 cm short axis. Enlarging bilateral supraclavicular adenopathy with reference node in the right supraclavicular region measuring 2.0 cm short axis (series 8, image 6) and left supraclavicular node measuring 1.9 cm (series 8, image 1). Multiple enlarging mediastinal lymph nodes including 1.6 cm precarinal node (series 8, image 42). Conglomerate masslike subcarinal adenopathy measuring approximately 4.2 x 2.9 cm (series 8, image 51). There are mildly enlarged bilateral hilar lymph nodes. No axillary lymphadenopathy. Circumferential masslike thickening of the distal esophagus is similar to prior CT 08/28/2019 but decreased from the previous PET-CT. Unremarkable thyroid.  Trachea is deviated to  the left from enlarged right paratracheal mass. Lungs/Pleura: New irregular 7 mm pulmonary nodule at the posterior right lung apex (series 10, image 16). No additional pulmonary nodules or masses. Bibasilar atelectasis. Lungs are otherwise clear. No pleural effusion or pneumothorax. Musculoskeletal: No suspicious osseous lesions.  No acute finding. Review of the MIP images confirms the above findings. CT ABDOMEN and PELVIS FINDINGS Hepatobiliary: Interval development of innumerable small ill-defined low-density hepatic masses compatible with metastatic disease. There is a small amount of layering sludge or small stones within a non dilated gallbladder. No biliary dilatation. Pancreas: Unremarkable. No pancreatic ductal dilatation or surrounding inflammatory changes. Spleen: Stable splenomegaly. Adrenals/Urinary Tract: Unremarkable adrenal glands. Small probable cyst within the inferior pole the left kidney. Punctate nonobstructing right renal calculus, unchanged. There is no hydronephrosis. Urinary bladder is unremarkable. Stomach/Bowel: Masslike thickening of the distal esophagus compatible with known esophageal carcinoma. Percutaneous gastrostomy tube is appropriately located within the gastric body. No dilated loops of bowel. Mild circumferential thickening of the rectosigmoid colon suggesting a nonspecific colitis. Normal appendix in the right lower quadrant. Vascular/Lymphatic: Interval progression of abdominal and retroperitoneal lymphadenopathy including masslike conglomeration of left periaortic nodes now measuring 6.1 x 3.6 cm (series 5, image 35), previously measured 3.9 x 1.6 cm. Celiac node measures 2.3 cm short axis (series 5, image 27), new from prior. Masslike soft tissue density in the retroaortic space near the diaphragmatic hiatus (series 5, image 25), progressed from prior. No acute vascular findings. Scattered atherosclerosis. Reproductive: Prostate is stable in appearance. Other: No free air,  free fluid, or abdominal wall hernia. Musculoskeletal: No suspicious osseous findings. No acute abnormality. Review of the MIP images confirms the above findings. IMPRESSION: 1. No evidence of pulmonary embolism. 2. Marked interval progression of lymphadenopathy within the chest and abdomen including mass-like high right paratracheal conglomerate adenopathy measuring up to 6.4 cm. 3. Interval development of innumerable small ill-defined low-density hepatic masses compatible with metastatic disease. 4. New irregular 7 mm pulmonary nodule at the posterior right lung apex, concerning for metastatic disease. 5. Mild circumferential thickening of the rectosigmoid colon suggesting a nonspecific colitis. 6. Ascending thoracic aorta measures up to 4.0 cm in diameter. Recommend annual imaging followup by CTA or MRA. This recommendation follows 2010 ACCF/AHA/AATS/ACR/ASA/SCA/SCAI/SIR/STS/SVM Guidelines for the Diagnosis and Management of Patients with Thoracic Aortic Disease. Circulation. 2010; 121: W119-J478. Aortic aneurysm NOS (ICD10-I71.9) 7. Aortic Atherosclerosis (ICD10-I70.0). Electronically Signed   By: Davina Poke D.O.   On: 09/19/2019 13:32    Scheduled Meds:  buPROPion  150 mg Oral Daily   Droxidopa  100 mg Gastrostomy Tube TID   enoxaparin (LOVENOX) injection  40 mg Subcutaneous Q24H   escitalopram  20 mg Per Tube Daily   midodrine  10 mg Per Tube TID WC   pantoprazole  40 mg Oral Daily   sucralfate  1 g Per Tube TID WC & HS   tamsulosin  0.4 mg Oral QHS   Continuous Infusions:  0.9 % NaCl with KCl 20 mEq / L 75 mL/hr at 09/19/19 1548   ceFEPime (MAXIPIME) IV 2 g (09/20/19 2956)    Assessment/Plan:  1. Metastatic stage IV esophageal cancer. Liver and lung lesions are new. Palliative care consultation. The patient is a DNR. Overall prognosis will be linked to the cancerous process. 2. Clinical sepsis of unclear cause on empiric Maxipime. Given a dose of Flagyl and vancomycin in  the emergency room. Follow-up cultures. Urine analysis negative. 3. Lactic acidosis. IV fluid hydration  4. Hypokalemia gave potassium and IV fluids. 5. Acute metabolic encephalopathy with underlying dementia. Patient mental status is better today after giving IV fluids. We will get an MRI of the brain 6. Acute kidney injury we will get IV fluid hydration 7. Anxiety depression continue usual medications 8. History of orthostatic hypotension. Hold on the Florinef for now and use midodrine. 9. Weakness. Physical therapy evaluation. [facility] currently at [facility] 10. Failed swallow evaluation. NPO. Dietary consultation to resume tube feeds. Hopefully will discontinue IV fluids tomorrow.  Code Status:     Code Status Orders  (From admission, onward)         Start     Ordered   09/19/19 1450  Do not attempt resuscitation (DNR)  Continuous       Question Answer Comment  In the event of cardiac or respiratory ARREST Do not call a code blue   In the event of cardiac or respiratory ARREST Do not perform Intubation, CPR, defibrillation or ACLS   In the event of cardiac or respiratory ARREST Use medication by any route, position, wound care, and other measures to relive pain and suffering. May use oxygen, suction and manual treatment of airway obstruction as needed for comfort.   Comments nurse may pronounce      09/19/19 1450        Code Status History    Date Active Date Inactive Code Status Order ID Comments User Context   09/19/2019 1449 09/19/2019 1450 DNR [id number]  Loletha Grayer, MD ED   08/27/2019 1244 09/10/2019 2011 DNR [id number]  Georgette Shell, MD Inpatient   08/26/2019 0411 08/27/2019 1244 Full Code [id number]  Rise Patience, MD Inpatient   08/06/2019 1935 08/14/2019 0253 Full Code [id number]  Elwyn Reach, MD Inpatient   07/21/2019 0927 07/25/2019 2323 Full Code [id number]  Truddie Hidden, MD Inpatient   07/05/2019 1935 07/12/2019 2231 Full Code [id number]  Orene Desanctis, DO Inpatient   06/23/2018 2215 06/24/2018 2019 Full Code [id number]  Lennice Sites, DO ED   10/27/2017 2326 10/29/2017 2128 Full Code [id number]  Davonna Belling, MD ED   Advance Care Planning Activity    Advance Directive Documentation     Most Recent Value  Type of Advance Directive Out of facility DNR (pink MOST or yellow form)  Pre-existing out of facility DNR order (yellow form or pink MOST form) Pink MOST/Yellow Form most recent copy in chart - Physician notified to receive inpatient order  "MOST" Form in Place? --     Family Communication: Spoke with ex-wife on the phone who is his main contact Disposition Plan: Status is: Inpatient  Dispo: The patient is from: Rehab              Anticipated d/c is to: Rehab              Anticipated d/c date is: 09/23/2019              Patient currently being treated for clinical sepsis of unclear cause and lactic acidosis. Patient's mental status is better today. Kidney function has improved. Awaiting palliative care consultation.  Consultants:  Palliative care  Antibiotics:  Maxipime  Time spent: 28 minutes  [place]

## 2019-09-20 NOTE — Progress Notes (Signed)
David [name] is admitted to [facility] with hypotension and failure to thrive.  Restaging CTs confirmed marked progression of metastatic disease involving chest/abdominal lymph nodes and the liver.  It is now clear his failure to thrive over the past few weeks is related to disease progression.  This also likely explains the hypercalcemia and the chest adenopathy may be contributing to orthostasis.  I discussed the case with Dr. Leslye Peer and his wife.  I recommend comfort/hospice care.  David [name] is not a candidate for salvage chemotherapy given his dementia and poor performance status.  His wife is in agreement with comfort care.  David [name] is a candidate for transfer to [facility].   Please call oncology as needed.  I will follow up on his chart early next week.

## 2019-09-21 LAB — BASIC METABOLIC PANEL
Anion gap: 14 (ref 5–15)
BUN: 17 mg/dL (ref 8–23)
CO2: 19 mmol/L — ABNORMAL LOW (ref 22–32)
Calcium: 9.6 mg/dL (ref 8.9–10.3)
Chloride: 109 mmol/L (ref 98–111)
Creatinine, Ser: 1.13 mg/dL (ref 0.61–1.24)
GFR calc Af Amer: >60 mL/min (ref >60)
GFR calc non Af Amer: >60 mL/min (ref >60)
Glucose, Bld: 130 mg/dL — ABNORMAL HIGH (ref 70–99)
Potassium: 3 mmol/L — ABNORMAL LOW (ref 3.5–5.1)
Sodium: 142 mmol/L (ref 135–145)

## 2019-09-21 LAB — CBC
HCT: 38.7 % — ABNORMAL LOW (ref 39.0–52.0)
Hemoglobin: 12.7 g/dL — ABNORMAL LOW (ref 13.0–17.0)
MCH: 29.1 pg (ref 26.0–34.0)
MCHC: 32.8 g/dL (ref 30.0–36.0)
MCV: 88.8 fL (ref 80.0–100.0)
Platelets: 292 10*3/uL (ref 150–400)
RBC: 4.36 MIL/uL (ref 4.22–5.81)
RDW: 15.6 % — ABNORMAL HIGH (ref 11.5–15.5)
WBC: 18 10*3/uL — ABNORMAL HIGH (ref 4.0–10.5)
nRBC: 0 % (ref 0.0–0.2)

## 2019-09-21 MED ORDER — QUETIAPINE FUMARATE 25 MG PO TABS
25.0000 mg | ORAL_TABLET | Freq: Every day | ORAL | Status: DC
  Administered 2019-09-21: 25 mg via ORAL
  Filled 2019-09-21: qty 1

## 2019-09-21 MED ORDER — HALOPERIDOL LACTATE 5 MG/ML IJ SOLN
1.0000 mg | Freq: Four times a day (QID) | INTRAMUSCULAR | Status: DC | PRN
  Administered 2019-09-21: 15:00:00 1 mg via INTRAVENOUS
  Filled 2019-09-21: qty 1

## 2019-09-21 MED ORDER — PSYLLIUM 95 % PO PACK
1.0000 | PACK | Freq: Every day | ORAL | Status: DC
  Administered 2019-09-22: 09:00:00 1 via ORAL
  Filled 2019-09-21 (×2): qty 1

## 2019-09-21 NOTE — Progress Notes (Signed)
Patient ID: David [name], male   DOB: June 27, 1945, 74 y.o.   MRN: [id number] [facility] PROGRESS NOTE  David [name] CVE:[id number] DOB: 1945/11/25 DOA: 09/19/2019 PCP: Leighton Ruff, MD  HPI/Subjective: Patient answered no to all questions.  He did not have any questions for me.  He denied any pain.  No shortness of breath.  Objective: Vitals:   09/20/19 2039 09/21/19 0804  BP: 124/65 (!) 141/76  Pulse: 80 97  Resp: 20 20  Temp: 98 F (36.7 C) 98.3 F (36.8 C)  SpO2: 100% 100%    Intake/Output Summary (Last 24 hours) at 09/21/2019 1319 Last data filed at 09/20/2019 1715 Gross per 24 hour  Intake --  Output 125 ml  Net -125 ml    ROS: Review of Systems  Unable to perform ROS: Acuity of condition  Respiratory: Negative for shortness of breath.   Cardiovascular: Negative for chest pain.  Gastrointestinal: Negative for abdominal pain.  Musculoskeletal: Negative for back pain.   Exam: Physical Exam  HENT:  Nose: No mucosal edema.  Mouth/Throat: No oropharyngeal exudate.  Eyes: Pupils are equal, round, and reactive to light. Conjunctivae and lids are normal.  Neck: Carotid bruit is not present.  Cardiovascular: S1 normal and S2 normal. Exam reveals no gallop.  No murmur heard. Respiratory: No respiratory distress. He has decreased breath sounds in the right lower field and the left lower field. He has no wheezes. He has no rhonchi. He has no rales.  GI: Soft. Bowel sounds are normal. There is no abdominal tenderness.  Musculoskeletal:     Right lower leg: Swelling present.     Left lower leg: Swelling present.  Neurological: He is alert. No cranial nerve deficit.  Skin: Skin is warm. Nails show no clubbing.  Scalp lesion seen      Data Reviewed: Basic Metabolic Panel: Recent Labs  Lab 09/17/19 0906 09/19/19 1109 09/20/19 0523 09/21/19 0557  NA 136 135 139 142  K 3.6 3.5 3.3* 3.0*  CL 107 106 111 109  CO2 15* 20* 23 19*  GLUCOSE 132* 124*  104* 130*  BUN 13 22 16 17   CREATININE 1.00 1.38* 0.97 1.13  CALCIUM 10.9* 9.9 8.6* 9.6   Liver Function Tests: Recent Labs  Lab 09/17/19 0906 09/19/19 1109  AST 21 29  ALT 22 26  ALKPHOS 89 91  BILITOT 1.2 1.2  PROT 6.8 6.5  ALBUMIN 3.3* 3.2*   CBC: Recent Labs  Lab 09/17/19 0906 09/19/19 1109 09/20/19 0523 09/21/19 0557  WBC 14.3* 16.6* 11.1* 18.0*  NEUTROABS 12.0* 15.1*  --   --   HGB 13.1 11.9* 10.6* 12.7*  HCT 39.5 35.0* 31.9* 38.7*  MCV 86.4 85.8 87.9 88.8  PLT 312 235 177 292     Recent Results (from the past 240 hour(s))  Culture, blood (Routine x 2)     Status: None (Preliminary result)   Collection Time: 09/19/19 11:09 AM   Specimen: BLOOD  Result Value Ref Range Status   Specimen Description BLOOD RIGHT AC  Final   Special Requests   Final    BOTTLES DRAWN AEROBIC AND ANAEROBIC Blood Culture results may not be optimal due to an inadequate volume of blood received in culture bottles   Culture   Final    NO GROWTH 2 DAYS Performed at [facility], [address] [place], [place] [address]    Report Status PENDING  Incomplete  Culture, blood (Routine x 2)     Status: None (  Preliminary result)   Collection Time: 09/19/19 11:40 AM   Specimen: BLOOD LEFT WRIST  Result Value Ref Range Status   Specimen Description BLOOD LEFT WRIST  Final   Special Requests   Final    BOTTLES DRAWN AEROBIC AND ANAEROBIC Blood Culture adequate volume   Culture   Final    NO GROWTH 2 DAYS Performed at [facility], [address], [place], [place] [address]    Report Status PENDING  Incomplete  Urine culture     Status: Abnormal (Preliminary result)   Collection Time: 09/19/19  2:15 PM   Specimen: In/Out Cath Urine  Result Value Ref Range Status   Specimen Description   Final    IN/OUT CATH URINE Performed at [facility], [address], [place], [place] [address]    Special Requests   Final    NONE Performed at [facility], [place], [place] [address]    Culture (A)  Final    1,000 COLONIES/mL ENTEROCOCCUS FAECIUM 1,000 COLONIES/mL STAPHYLOCOCCUS LUGDUNENSIS    Report Status PENDING  Incomplete     Studies: MR BRAIN W WO CONTRAST  Result Date: 09/20/2019 CLINICAL DATA:  Encephalopathy. EXAM: MRI HEAD WITHOUT AND WITH CONTRAST TECHNIQUE: Multiplanar, multiecho pulse sequences of the brain and surrounding structures were obtained without and with intravenous contrast. CONTRAST:  51mL GADAVIST GADOBUTROL 1 MMOL/ML IV SOLN COMPARISON:  MRI of the brain August 09, 2019 FINDINGS: Brain: No acute infarction, hemorrhage, hydrocephalus, extra-axial collection or mass lesion. There is significant prominence of the ventricular system, moderate prominence of cerebral and cerebellar sulci, more pronounced at the mesial temporal lobes, stable. T2 hyperintensities within the white matter of the cerebral hemispheres, predominantly periventricular is unchanged. Small area of encephalomalacia within the left gyrus rectus, unchanged from prior MRI. No focus of abnormal contrast enhancement identified. Vascular: Normal flow voids. Skull and upper cervical spine: Normal marrow signal. Sinuses/Orbits: Bilateral lens surgery. Paranasal sinuses are clear. Other: New scalp lesion in the right parietal region, likely posttraumatic. IMPRESSION: 1. No acute intracranial abnormality or abnormal contrast enhancement. 2. Stable moderate prominence of the ventricular system and parenchymal volume loss, more pronounced at the mesial temporal. 3. 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 4. Small area of encephalomalacia within the left gyrus rectus, unchanged from prior MRI. 5. New scalp lesion in the right parietal region, likely posttraumatic. Electronically Signed   By: Pedro Earls M.D.   On: 09/20/2019 15:20   DG Swallowing Func-Speech Pathology  Result Date:  09/20/2019 Objective Swallowing Evaluation: Type of Study: MBS-Modified Barium Swallow Study  Patient Details Name: David [name] MRN: [id number] Date of Birth: 1945-09-28 Today's Date: 09/20/2019 Time: SLP Start Time (ACUTE ONLY): 0900 -SLP Stop Time (ACUTE ONLY): 0913 SLP Time Calculation (min) (ACUTE ONLY): 13 min Past Medical History: Past Medical History: Diagnosis Date . Anxiety  . BPH (benign prostatic hyperplasia)  . Cancer ([place])  . Dementia ([place])  . Depression  . Hyperlipidemia  . Hypertension  . Kidney stone  . Memory loss  . 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 Sleep apnea   CPAP . Vitamin D deficiency  Past Surgical History: Past Surgical History: Procedure Laterality Date . BIOPSY  08/31/2019  Procedure: BIOPSY;  Surgeon: Wonda Horner, MD;  Location: [facility];  Service: Endoscopy;; . CATARACT EXTRACTION, BILATERAL  2018 . ESOPHAGOGASTRODUODENOSCOPY N/A 08/31/2019  Procedure: ESOPHAGOGASTRODUODENOSCOPY (EGD);  Surgeon: Wonda Horner, MD;  Location:  [facility];  Service: Endoscopy;  Laterality: N/A; . EXTERNAL EAR SURGERY    child . EYE SURGERY Bilateral   cataract . IR GASTROSTOMY TUBE MOD SED  07/08/2019 . right knee meniscus  06/12/2008 . right talus repair    dislocated HPI: 74yo male admitted 08/25/19 with confusion, fever, and weakness. PMH: anxiety, HLD, HTN, kidney stone, narcissistic personality disorder, OSA, esophageal cancer, PEG tube, BPH, dementia, depression. Pt admitted for confusion on 09/19/2019 with MRI revealing no evidence of pulmonary embolism, marked interval progression of lymphadenopathy within the chest and abdomen including mass-like high right paratracheal conglomerate adenopathy measuring up to 6.4 cm, interval development of innumerable small ill-defined low-density hepatic masses compatible with metastatic disease, new irregular 7 mm pulmonary nodule at the posterior right lung apex, concerning for metastatic disease and mild circumferential thickening of the  rectosigmoid colon suggesting a nonspecific colitis.  Subjective: pt more confused in unfamiliar setting in Radiology Assessment / Plan / Recommendation CHL IP CLINICAL IMPRESSIONS 09/20/2019 Clinical Impression Pt has severe sensorimotor oropharyngeal dysphagia that is further complicated by pt's altered mental status.  Pt has severe oral phase deficits that are c/b decreased weak lingual manipu;ation, decreased bolus cohesion, premature spillage and piecemeal swallowing across all consistencies assessed (thin liquids, nectar thick liquids and puree). Pt's pharyngeal deficits included delayed swallow initiation to the vallecula and pyriform sinuses with moderate pharyngeal residue post swallow in the vallecula with mild residue in the pyriform sinuses.  Pt volitionary produced mutliple swallows per bolus but was not able to decrease pharyngeal residue. With a spoon bolus, very little of the bolus enters the esophagus leaving a large amount of residue in the vallecula and mild amounts of residue in the pyriform sinuses. Heavier weight of puree help with UES opening. When consuming thin liquids pt with penetration during the swallow and eventual aspiration. While pt has a significant cough response, it is now productive in expelling the aspirates. While penetration or aspiration is not observed with puree or nectar thick liquids however pt continues with risk of aspirating pharyngeal residue. Pt's confusion appeared excerbated by unfamiliar setting in radiology suite. He was perseverative, difficult to redirect, restless, irritable, unable to follow directions; therefore he was not able to engage in any compensatory strategies and trials were moderately limited d/t his increased altered state. Pt has pre-existing risk of post-paryndial aspiration and high risk of aspirating d/t oropharyngeal dysphagia. As such, recommend NPO status. If pt's GOC include PO intake with known aspiration risk he should have extensive oral  care prior and post PO intake, ice chips following oral care pose a reduced risk in general.  SLP Visit Diagnosis Dysphagia, oropharyngeal phase (R13.12) Attention and concentration deficit following -- Frontal lobe and executive function deficit following -- Impact on safety and function Severe aspiration risk;Risk for inadequate nutrition/hydration   CHL IP TREATMENT RECOMMENDATION 09/20/2019 Treatment Recommendations No treatment recommended at this time   Prognosis 08/30/2019 Prognosis for Safe Diet Advancement Good Barriers to Reach Goals -- Barriers/Prognosis Comment -- CHL IP DIET RECOMMENDATION 09/20/2019 SLP Diet Recommendations NPO Liquid Administration via -- Medication Administration Via alternative means Compensations -- Postural Changes --   CHL IP OTHER RECOMMENDATIONS 09/20/2019 Recommended Consults -- Oral Care Recommendations -- Other Recommendations Remove water pitcher   CHL IP FOLLOW UP RECOMMENDATIONS 09/20/2019 Follow up Recommendations (No Data)   CHL IP FREQUENCY AND DURATION 08/30/2019 Speech Therapy Frequency (ACUTE ONLY) min 1 x/week Treatment Duration 1 week;2 weeks      CHL IP ORAL PHASE  09/20/2019 Oral Phase Impaired Oral - Pudding Teaspoon -- Oral - Pudding Cup -- Oral - Honey Teaspoon -- Oral - Honey Cup -- Oral - Nectar Teaspoon Weak lingual manipulation;Reduced posterior propulsion;Premature spillage;Decreased bolus cohesion;Delayed oral transit Oral - Nectar Cup -- Oral - Nectar Straw -- Oral - Thin Teaspoon Weak lingual manipulation;Reduced posterior propulsion;Piecemeal swallowing;Delayed oral transit;Decreased bolus cohesion;Premature spillage Oral - Thin Cup -- Oral - Thin Straw -- Oral - Puree Weak lingual manipulation;Reduced posterior propulsion;Piecemeal swallowing;Delayed oral transit;Decreased bolus cohesion;Premature spillage Oral - Mech Soft -- Oral - Regular -- Oral - Multi-Consistency -- Oral - Pill -- Oral Phase - Comment --  CHL IP PHARYNGEAL PHASE 09/20/2019 Pharyngeal  Phase Impaired Pharyngeal- Pudding Teaspoon -- Pharyngeal -- Pharyngeal- Pudding Cup -- Pharyngeal -- Pharyngeal- Honey Teaspoon -- Pharyngeal -- Pharyngeal- Honey Cup -- Pharyngeal -- Pharyngeal- Nectar Teaspoon Delayed swallow initiation-vallecula;Delayed swallow initiation-pyriform sinuses;Reduced pharyngeal peristalsis;Pharyngeal residue - valleculae;Pharyngeal residue - pyriform Pharyngeal -- Pharyngeal- Nectar Cup -- Pharyngeal -- Pharyngeal- Nectar Straw -- Pharyngeal -- Pharyngeal- Thin Teaspoon Delayed swallow initiation-vallecula;Delayed swallow initiation-pyriform sinuses;Reduced pharyngeal peristalsis;Penetration/Aspiration during swallow;Pharyngeal residue - valleculae;Pharyngeal residue - pyriform Pharyngeal Material enters airway, passes BELOW cords and not ejected out despite cough attempt by patient Pharyngeal- Thin Cup -- Pharyngeal -- Pharyngeal- Thin Straw -- Pharyngeal -- Pharyngeal- Puree Delayed swallow initiation-vallecula;Delayed swallow initiation-pyriform sinuses;Pharyngeal residue - valleculae;Pharyngeal residue - pyriform Pharyngeal -- Pharyngeal- Mechanical Soft -- Pharyngeal -- Pharyngeal- Regular -- Pharyngeal -- Pharyngeal- Multi-consistency -- Pharyngeal -- Pharyngeal- Pill -- Pharyngeal -- Pharyngeal Comment --  CHL IP CERVICAL ESOPHAGEAL PHASE 09/20/2019 Cervical Esophageal Phase Impaired Pudding Teaspoon -- Pudding Cup -- Honey Teaspoon -- Honey Cup -- Nectar Teaspoon Reduced cricopharyngeal relaxation Nectar Cup -- Nectar Straw -- Thin Teaspoon Reduced cricopharyngeal relaxation Thin Cup -- Thin Straw -- Puree WFL Mechanical Soft -- Regular -- Multi-consistency -- Pill -- Cervical Esophageal Comment -- Happi Overton 09/20/2019, 2:30 PM               Scheduled Meds: . buPROPion  150 mg Oral Daily  . Droxidopa  100 mg Gastrostomy Tube TID  . enoxaparin (LOVENOX) injection  40 mg Subcutaneous Q24H  . escitalopram  20 mg Per Tube Daily  . feeding supplement (PRO-STAT SUGAR  FREE 64)  30 mL Per Tube Daily  . free water  150 mL Per Tube Q4H  . midodrine  10 mg Per Tube TID WC  . pantoprazole  40 mg Oral Daily  . sucralfate  1 g Per Tube TID WC & HS  . tamsulosin  0.4 mg Oral QHS   Continuous Infusions: . 0.9 % NaCl with KCl 20 mEq / L 50 mL/hr at 09/20/19 1640  . ceFEPime (MAXIPIME) IV 2 g (09/21/19 3267)  . feeding supplement (KATE FARMS STANDARD 1.4) 1,680 mL (09/20/19 2121)    Assessment/Plan:  1. Metastatic stage IV esophageal cancer. Liver and lung lesions are new. Palliative care consultation appreciated.  The patient is a DNR.  Case discussed with patient's oncologist Dr. Benay Spice yesterday and he is in agreement to going to hospice home.  They should have a bed available tomorrow for hospice home. 2. Clinical sepsis (present on admission) of unclear cause on empiric Maxipime. Given a dose of Flagyl and vancomycin in the emergency room.  Cultures are negative.  I will stop antibiotics upon going to the hospice home. 3. Lactic acidosis. IV fluid hydration 4. Hypokalemia gave potassium in IV fluids. 5. Acute metabolic encephalopathy with underlying dementia.  Patient answers some questions  today.  MRI brain negative for metastases. 6. Acute kidney injury we will get IV fluid hydration 7. Anxiety depression continue usual medications 8. History of orthostatic hypotension. Hold on the Florine[place] for now and use midodrine. 9. Failed swallow evaluation. NPO.  Patient on tube feeds.  Code Status:     Code Status Orders  (From admission, onward)         Start     Ordered   09/19/19 1450  Do not attempt resuscitation (DNR)  Continuous       Question Answer Comment  In the event of cardiac or respiratory ARREST Do not call a "code blue"   In the event of cardiac or respiratory ARREST Do not perform Intubation, CPR, defibrillation or ACLS   In the event of cardiac or respiratory ARREST Use medication by any route, position, wound care, and other measures  to relive pain and suffering. May use oxygen, suction and manual treatment of airway obstruction as needed for comfort.   Comments nurse may pronounce      09/19/19 1450        Code Status History    Date Active Date Inactive Code Status Order ID Comments User Context   09/19/2019 1449 09/19/2019 1450 DNR [id number]  Loletha Grayer, MD ED   08/27/2019 1244 09/10/2019 2011 DNR [id number]  Georgette Shell, MD Inpatient   08/26/2019 0411 08/27/2019 1244 Full Code [id number]  Rise Patience, MD Inpatient   08/06/2019 1935 08/14/2019 0253 Full Code [id number]  Elwyn Reach, MD Inpatient   07/21/2019 0927 07/25/2019 2323 Full Code [id number]  Truddie Hidden, MD Inpatient   07/05/2019 1935 07/12/2019 2231 Full Code [id number]  Orene Desanctis, DO Inpatient   06/23/2018 2215 06/24/2018 2019 Full Code [id number]  Lennice Sites, DO ED   10/27/2017 2326 10/29/2017 2128 Full Code [id number]  Davonna Belling, MD ED   Advance Care Planning Activity    Advance Directive Documentation     Most Recent Value  Type of Advance Directive Out of facility DNR (pink MOST or yellow form)  Pre-existing out of facility DNR order (yellow form or pink MOST form) Pink MOST/Yellow Form most recent copy in chart - Physician notified to receive inpatient order  "MOST" Form in Place? --     Family Communication: Spoke with ex-wife on the phone who is his main contact Disposition Plan: Status is: Inpatient  Dispo: The patient is from: Rehab              Anticipated d/c is to: Hospice home              Anticipated d/c date is: 09/22/2019              Patient with metastatic esophageal cancer to lungs and liver.  The patient will be discharged to the hospice home hopefully tomorrow.  Consultants:  Palliative care  Antibiotics:  Maxipime  Time spent: 27 minutes  [place]

## 2019-09-21 NOTE — Progress Notes (Signed)
Phone call to Zandra Abts, RN [place] [phone] to check bed availability for [facility]. Voicemail message left requesting a return call.    [address], LCSW Transitions of Care [phone]

## 2019-09-21 NOTE — Progress Notes (Addendum)
Nutrition Brief Note  RD received page via on-call pager.   Wife concerned about ongoing diarrhea with advancement in TF. Patient has experienced on/off diarrhea since starting TF on 07/09/2019. He was started on Osmolite 1.5 when hist PEG was first placed and experienced diarrhea/consiptation. Was changed to [facility] 1.4 on 4/12 and symptoms continued.   Will trial addition of stool bulking fiber supplement (Metamucil) with advancement of TF. If diarrhea continues may need to consider transitioning to Jevity 1.5. Patient to d/c to [facility] tomorrow. RN to reach out to palliative to inquire about pt possible taking POs.   Mariana Single RD, LDN Clinical Nutrition Pager listed in [place]

## 2019-09-21 NOTE — Progress Notes (Addendum)
Manufacturing [facility])  David [name] does not have a bed today.  Plan to admit him Sunday to [facility], pending any unforseen circumstances.  ACC will reach out to [place] tomorrow and update once bed is available.  Wife updated.   Venia Carbon RN, BSN, [facility] Liaison

## 2019-09-22 DIAGNOSIS — F411 Generalized anxiety disorder: Secondary | ICD-10-CM

## 2019-09-22 LAB — URINE CULTURE: Culture: 1000 — AB

## 2019-09-22 MED ORDER — LORAZEPAM 0.5 MG PO TABS: 0.5000 mg | ORAL_TABLET | Freq: Four times a day (QID) | ORAL | Status: AC | PRN

## 2019-09-22 MED ORDER — QUETIAPINE FUMARATE 25 MG PO TABS: 25.0000 mg | ORAL_TABLET | Freq: Every evening | ORAL | Status: DC | PRN

## 2019-09-22 MED ORDER — DROXIDOPA 100 MG PO CAPS: 100.0000 mg | ORAL_CAPSULE | Freq: Three times a day (TID) | ORAL | Status: AC

## 2019-09-22 MED ORDER — ESCITALOPRAM OXALATE 20 MG PO TABS: 20.0000 mg | ORAL_TABLET | Freq: Every day | ORAL | Status: AC

## 2019-09-22 MED ORDER — MIDODRINE HCL 10 MG PO TABS: 10.0000 mg | ORAL_TABLET | Freq: Three times a day (TID) | ORAL | Status: AC

## 2019-09-22 MED ORDER — ACETAMINOPHEN 650 MG RE SUPP: 650.0000 mg | Freq: Four times a day (QID) | RECTAL | 0 refills | Status: AC | PRN

## 2019-09-22 MED ORDER — MORPHINE SULFATE (CONCENTRATE) 10 MG/0.5ML PO SOLN: 5.0000 mg | ORAL | Status: AC | PRN

## 2019-09-22 MED ORDER — GLYCOPYRROLATE 1 MG/5ML PO SOLN: 1.0000 mg | ORAL | Status: AC | PRN

## 2019-09-22 MED ORDER — QUETIAPINE FUMARATE 25 MG PO TABS: 25.0000 mg | ORAL_TABLET | Freq: Every evening | ORAL | Status: AC | PRN

## 2019-09-22 NOTE — Progress Notes (Signed)
IV removed before discharge. Called report to Jeral Pinch at [facility]. Patient is transporting via EMS.

## 2019-09-22 NOTE — TOC Transition Note (Addendum)
Transition of Care [facility]) - CM/SW Discharge Note   Patient Details  Name: David [name] MRN: [id number] Date of Birth: [birth year]  Transition of Care [facility]) CM/SW Contact:  Boris Sharper, LCSW Phone Number: 09/22/2019, 10:19 AM   Clinical Narrative:    Pt medically stable for discharge per MD. Pt will be transported via EMS to [facility], pt will be in room 6, Call to report number is [phone]. CSW notified pt's spouse of discharge. CSW will arrange EMS transport once nursing staff is ready.   Final next level of care: [place] Barriers to Discharge: No Barriers Identified   Patient Goals and CMS Choice        Discharge Placement              Patient chooses bed at: Other - please specify in the comment section below: ([place]) Patient to be transferred to facility by: EMS Name of family member notified: [name] Patient and family notified of of transfer: 09/22/19  Discharge Plan and Services                                     Social Determinants of Health (SDOH) Interventions     Readmission Risk Interventions No flowsheet data found.

## 2019-09-22 NOTE — Progress Notes (Signed)
[facility]) hospital liaison note  [place] [facility] is able to offer a bed today.  Spouse [name] updated.  Consents signed and completed at this time.    Please call report to [phone].  Please send DNR form with patient.  Thank you for the opportunity to participate in this patient's care.  Domenic Moras, BSN, RN [facility] Liaison [phone]

## 2019-09-22 NOTE — Discharge Summary (Signed)
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 Anxiety   . BPH (benign prostatic hyperplasia)   . Cancer ([place])   . Dementia ([place])   . Depression   . Hyperlipidemia   . Hypertension   . Kidney stone   . Memory loss   . 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 Sleep apnea    CPAP  . Vitamin D deficiency     HOSPITAL COURSE:   1.  Metastatic stage IV esophageal cancer.  Lung and liver lesions are new.  Patient is a DO NOT RESUSCITATE.  Patient will be discharged to hospice home when bed available.  Overall prognosis is poor.  Comfort care measures at the hospice home. 2.  Clinical sepsis (present on admission).  Unclear cause.  Was given empiric Maxipime.  Patient did give a dose of Flagyl and vancomycin in the emergency room.  Cultures are negative.  Stop antibiotics since going to the hospice home. 3.  Lactic acidosis.  Patient was given IV fluid hydration. 4.  Hypokalemia we did give potassium in the IV fluids. 5.  Acute metabolic encephalopathy with underlying dementia.  Patient does answer some yes or no questions.  MRI of the brain was negative for metastases. 6.  Acute kidney injury.  Patient was given IV fluid hydration during the  hospital course 7.  Anxiety depression.  Continue Lexapro.  Patient did have some agitation yesterday requiring some Haldol and Seroquel at night.  Will have Seroquel as needed at night for agitation. 8.  History of orthostatic hypotension.  Continue to hold Florinef.  On midodrine and droxidopa.  Since the patient will not be ambulating, can potentially stop these also. 9.  Patient failed swallow evaluation he was kept n.p.o. here.  We did give him tube feeds here but that caused diarrhea.  Patient can have dysphagia 1 diet with honey thick liquids for pleasure only at the hospice home.  DISCHARGE CONDITIONS:   Guarded  CONSULTS OBTAINED:  Treatment Team:  Ladell Pier, MD  DRUG ALLERGIES:   Allergies  Allergen Reactions  . Other     Other reaction(s): Other Sleep walk   . Abilify [Aripiprazole] Other (See Comments)    Short term memory loss  . Lamictal [Lamotrigine]     Increased agitation    DISCHARGE MEDICATIONS:   Allergies as of 09/22/2019      Reactions   Other    Other reaction(s): Other Sleep walk    Abilify [aripiprazole] Other (See Comments)   Short term memory loss   Lamictal [lamotrigine]    Increased agitation      Medication List    STOP taking these medications   buPROPion 150 MG 24 hr tablet Commonly known as: WELLBUTRIN XL   esomeprazole 20 MG packet Commonly  known as: NEXIUM   feeding supplement (PRO-STAT SUGAR FREE 64) Liqd   fludrocortisone 0.1 MG tablet Commonly known as: FLORINEF   KATE FARMS STANDARD 1.4 PO   prochlorperazine 5 MG tablet Commonly known as: COMPAZINE   simvastatin 40 MG tablet Commonly known as: ZOCOR   sucralfate 1 GM/10ML suspension Commonly known as: CARAFATE   tamsulosin 0.4 MG Caps capsule Commonly known as: FLOMAX   vitamin B-12 1000 MCG tablet Commonly known as: CYANOCOBALAMIN   VITAMIN D3 PO     TAKE these medications   acetaminophen 650 MG suppository Commonly known as: TYLENOL Place 1  suppository (650 mg total) rectally every 6 (six) hours as needed for mild pain (or Fever >/= 101).   Droxidopa 100 MG Caps 1 capsule (100 mg total) by Gastrostomy Tube route 3 (three) times daily.   escitalopram 20 MG tablet Commonly known as: LEXAPRO Place 1 tablet (20 mg total) into feeding tube daily.   Glycopyrrolate 1 MG/5ML Soln Give 5 mLs (1 mg total) by tube every 4 (four) hours as needed (secretions).   LORazepam 0.5 MG tablet Commonly known as: ATIVAN Place 1 tablet (0.5 mg total) into feeding tube every 6 (six) hours as needed for anxiety.   midodrine 10 MG tablet Commonly known as: PROAMATINE Place 1 tablet (10 mg total) into feeding tube 3 (three) times daily with meals.   morphine CONCENTRATE 10 MG/0.5ML Soln concentrated solution Take 0.25 mLs (5 mg total) by mouth every 2 (two) hours as needed for severe pain or shortness of breath.   ondansetron 8 MG tablet Commonly known as: ZOFRAN Take 1 tablet (8 mg total) by mouth every 8 (eight) hours as needed for nausea or vomiting. What changed: how to take this   QUEtiapine 25 MG tablet Commonly known as: SEROQUEL Take 1 tablet (25 mg total) by mouth at bedtime as needed (insomnia or agitation).        DISCHARGE INSTRUCTIONS:   Follow-up hospice home 1 day.   Please note  You were cared for by a hospitalist during your hospital stay. If you have any questions about your discharge medications or the care you received while you were in the hospital after you are discharged, you can call the unit and asked to speak with the hospitalist on call if the hospitalist that took care of you is not available. Once you are discharged, your primary care physician will handle any further medical issues. Please note that NO REFILLS for any discharge medications will be authorized once you are discharged, as it is imperative that you return to your primary care physician (or establish a relationship with a primary care physician  if you do not have one) for your aftercare needs so that they can reassess your need for medications and monitor your lab values.    Today   CHIEF COMPLAINT:   Chief Complaint  Patient presents with  . Altered Mental Status  . Diarrhea    HISTORY OF PRESENT ILLNESS:  Kimmie Kiernan  is a 74 y.o. male sent in for low blood pressure and altered mental status   VITAL SIGNS:  Blood pressure 131/64, pulse 92, temperature 97.8 F (36.6 C), temperature source Oral, resp. rate 18, height 6\' 2"  (1.88 m), SpO2 99 %.    PHYSICAL EXAMINATION:  GENERAL:  74 y.o.-year-old patient lying in the bed with no acute distress.  EYES: Pupils equal, round, reactive to light and accommodation. No scleral icterus. HEENT: Head atraumatic, normocephalic. Oropharynx and nasopharynx  clear.  LUNGS: Normal breath sounds bilaterally, no wheezing, rales,rhonchi or crepitation. No use of accessory muscles of respiration.  CARDIOVASCULAR: S1, S2 normal. No murmurs, rubs, or gallops.  ABDOMEN: Soft, non-tender, non-distended. Bowel sounds present. No organomegaly or mass.  EXTREMITIES: Trace pedal edema.  PSYCHIATRIC: The patient is alert and answers yes or no questions but does not elaborate.  SKIN: No obvious rash, lesion, or ulcer.   DATA REVIEW:   CBC Recent Labs  Lab 09/21/19 0557  WBC 18.0*  HGB 12.7*  HCT 38.7*  PLT 292    Chemistries  Recent Labs  Lab 09/19/19 1109 09/20/19 0523 09/21/19 0557  NA 135   < > 142  K 3.5   < > 3.0*  CL 106   < > 109  CO2 20*   < > 19*  GLUCOSE 124*   < > 130*  BUN 22   < > 17  CREATININE 1.38*   < > 1.13  CALCIUM 9.9   < > 9.6  AST 29  --   --   ALT 26  --   --   ALKPHOS 91  --   --   BILITOT 1.2  --   --    < > = values in this interval not displayed.    Microbiology Results  Results for orders placed or performed during the hospital encounter of 09/19/19  Culture, blood (Routine x 2)     Status: None (Preliminary result)   Collection Time:  09/19/19 11:09 AM   Specimen: BLOOD  Result Value Ref Range Status   Specimen Description BLOOD RIGHT [facility]  Final   Special Requests   Final    BOTTLES DRAWN AEROBIC AND ANAEROBIC Blood Culture results may not be optimal due to an inadequate volume of blood received in culture bottles   Culture   Final    NO GROWTH 3 DAYS Performed at [facility], [address], [place], [place] [address]    Report Status PENDING  Incomplete  Culture, blood (Routine x 2)     Status: None (Preliminary result)   Collection Time: 09/19/19 11:40 AM   Specimen: BLOOD LEFT WRIST  Result Value Ref Range Status   Specimen Description BLOOD LEFT WRIST  Final   Special Requests   Final    BOTTLES DRAWN AEROBIC AND ANAEROBIC Blood Culture adequate volume   Culture   Final    NO GROWTH 3 DAYS Performed at [facility], [address] [place], [place] [address]    Report Status PENDING  Incomplete  Urine culture     Status: Abnormal   Collection Time: 09/19/19  2:15 PM   Specimen: In/Out Cath Urine  Result Value Ref Range Status   Specimen Description   Final    IN/OUT CATH URINE Performed at [facility], [address] [place], [place] [address]    Special Requests   Final    NONE Performed at [facility], [address] [place], [place] [address]    Culture (A)  Final    1,000 COLONIES/mL VANCOMYCIN RESISTANT ENTEROCOCCUS 1,000 COLONIES/mL STAPHYLOCOCCUS LUGDUNENSIS    Report Status 09/22/2019 FINAL  Final   Organism ID, Bacteria VANCOMYCIN RESISTANT ENTEROCOCCUS (A)  Final   Organism ID, Bacteria STAPHYLOCOCCUS LUGDUNENSIS (A)  Final      Susceptibility   Vancomycin resistant enterococcus - MIC*    AMPICILLIN >=32 RESISTANT Resistant     NITROFURANTOIN 64 INTERMEDIATE Intermediate     VANCOMYCIN >=  32 RESISTANT Resistant     LINEZOLID 2 SENSITIVE Sensitive     * 1,000 COLONIES/mL VANCOMYCIN RESISTANT ENTEROCOCCUS   Staphylococcus lugdunensis - MIC*     CIPROFLOXACIN >=8 RESISTANT Resistant     GENTAMICIN <=0.5 SENSITIVE Sensitive     NITROFURANTOIN <=16 SENSITIVE Sensitive     OXACILLIN >=4 RESISTANT Resistant     TETRACYCLINE >=16 RESISTANT Resistant     VANCOMYCIN 1 SENSITIVE Sensitive     TRIMETH/SULFA <=10 SENSITIVE Sensitive     CLINDAMYCIN 2 INTERMEDIATE Intermediate     RIFAMPIN 2 INTERMEDIATE Intermediate     Inducible Clindamycin NEGATIVE Sensitive     * 1,000 COLONIES/mL STAPHYLOCOCCUS LUGDUNENSIS    RADIOLOGY:  MR BRAIN W WO CONTRAST  Result Date: 09/20/2019 CLINICAL DATA:  Encephalopathy. EXAM: MRI HEAD WITHOUT AND WITH CONTRAST TECHNIQUE: Multiplanar, multiecho pulse sequences of the brain and surrounding structures were obtained without and with intravenous contrast. CONTRAST:  14mL GADAVIST GADOBUTROL 1 MMOL/ML IV SOLN COMPARISON:  MRI of the brain August 09, 2019 FINDINGS: Brain: No acute infarction, hemorrhage, hydrocephalus, extra-axial collection or mass lesion. There is significant prominence of the ventricular system, moderate prominence of cerebral and cerebellar sulci, more pronounced at the mesial temporal lobes, stable. T2 hyperintensities within the white matter of the cerebral hemispheres, predominantly periventricular is unchanged. Small area of encephalomalacia within the left gyrus rectus, unchanged from prior MRI. No focus of abnormal contrast enhancement identified. Vascular: Normal flow voids. Skull and upper cervical spine: Normal marrow signal. Sinuses/Orbits: Bilateral lens surgery. Paranasal sinuses are clear. Other: New scalp lesion in the right parietal region, likely posttraumatic. IMPRESSION: 1. No acute intracranial abnormality or abnormal contrast enhancement. 2. Stable moderate prominence of the ventricular system and parenchymal volume loss, more pronounced at the mesial temporal. 3. 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 4. Small area of  encephalomalacia within the left gyrus rectus, unchanged from prior MRI. 5. New scalp lesion in the right parietal region, likely posttraumatic. Electronically Signed   By: Pedro Earls M.D.   On: 09/20/2019 15:20     Management plans discussed with the patient, and he is agreement.  Spoke with the patient's wife yesterday afternoon and let her know we were waiting on hospice home bed availability for today.  CODE STATUS:     Code Status Orders  (From admission, onward)         Start     Ordered   09/19/19 1450  Do not attempt resuscitation (DNR)  Continuous       Question Answer Comment  In the event of cardiac or respiratory ARREST Do not call a "code blue"   In the event of cardiac or respiratory ARREST Do not perform Intubation, CPR, defibrillation or ACLS   In the event of cardiac or respiratory ARREST Use medication by any route, position, wound care, and other measures to relive pain and suffering. May use oxygen, suction and manual treatment of airway obstruction as needed for comfort.   Comments nurse may pronounce      09/19/19 1450        Code Status History    Date Active Date Inactive Code Status Order ID Comments User Context   09/19/2019 1449 09/19/2019 1450 DNR [id number]  Loletha Grayer, MD ED   08/27/2019 1244 09/10/2019 2011 DNR [id number]  Georgette Shell, MD Inpatient   08/26/2019 0411 08/27/2019 1244 Full Code [id number]  Rise Patience, MD Inpatient   08/06/2019  1935 08/14/2019 0253 Full Code [id number]  Elwyn Reach, MD Inpatient   07/21/2019 0927 07/25/2019 2323 Full Code [id number]  Truddie Hidden, MD Inpatient   07/05/2019 1935 07/12/2019 2231 Full Code [id number]  Orene Desanctis, DO Inpatient   06/23/2018 2215 06/24/2018 2019 Full Code [id number]  Lennice Sites, DO ED   10/27/2017 2326 10/29/2017 2128 Full Code [id number]  Davonna Belling, MD ED   Advance Care Planning Activity    Advance Directive Documentation     Most Recent Value  Type  of Advance Directive Out of facility DNR (pink MOST or yellow form)  Pre-existing out of facility DNR order (yellow form or pink MOST form) Pink MOST/Yellow Form most recent copy in chart - Physician notified to receive inpatient order  "MOST" Form in Place? --      TOTAL TIME TAKING CARE OF THIS PATIENT: 31 minutes.    Loletha Grayer M.D on 09/22/2019 at 9:42 AM  Between 7am to 6pm - Pager - [phone]  After 6pm go to [email or web address] - password [email or web address]  [facility]  CC: Primary care physician; Leighton Ruff, MD

## 2019-09-22 NOTE — Progress Notes (Signed)
Patient ID: David [name], male   DOB: 25-Nov-1945, 74 y.o.   MRN: [id number]  Spoke with wife on the phone and gave update that we have a bed for today.  Dr Leslye Peer

## 2019-09-24 LAB — CULTURE, BLOOD (ROUTINE X 2)
Culture: NO GROWTH
Culture: NO GROWTH
Special Requests: ADEQUATE

## 2019-09-25 ENCOUNTER — Ambulatory Visit: Payer: Medicare Other | Admitting: Nurse Practitioner

## 2019-09-25 ENCOUNTER — Inpatient Hospital Stay: Payer: Medicare Other

## 2019-09-25 ENCOUNTER — Inpatient Hospital Stay: Payer: Medicare Other | Admitting: Oncology

## 2019-09-25 ENCOUNTER — Other Ambulatory Visit: Payer: Medicare Other

## 2019-10-10 DEATH — deceased

## 2020-10-09 DEATH — deceased

## 2021-01-28 IMAGING — MR MR HEAD W/O CM
11 series · 48 of 48 positions shown · non-contrast
Comparison: Noncontrast head CT 08/06/2019, brain MRI 10/28/2017.

CLINICAL DATA: Dizziness, dehydration or hypotension. Additional
history provided: Medical history significant for esophageal cancer
on palliative chemotherapy, hypertension, dementia, hyperlipidemia,
BPH, anxiety disorder presenting with intermittent dizziness.

EXAM:
MRI HEAD WITHOUT CONTRAST
TECHNIQUE: Multiplanar, multiecho pulse sequences of the brain and surrounding
structures were obtained without intravenous contrast.

[Series 5: DWI · axial · 3.0mm · 1.36mm/px · z∈[-24,+134]mm · 7 of 106 slices shown (1 of 4)]
[im 1/106]
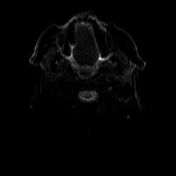
[im 18/106]
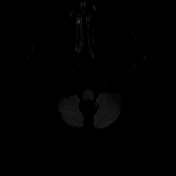
[im 36/106]
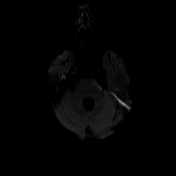
[im 53/106]
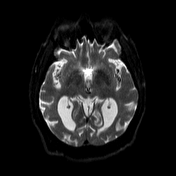
[im 71/106]
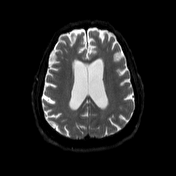
[im 88/106]
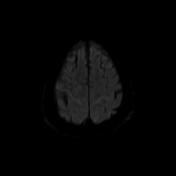
[im 106/106]
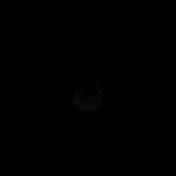

[Series 6: DWI · axial · 3.0mm · 1.36mm/px · z∈[-24,+134]mm · 4 of 54 slices shown (2 of 4)]
[im 1/54]
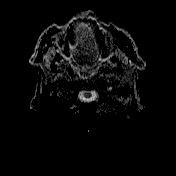
[im 18/54]
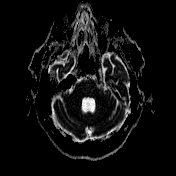
[im 36/54]
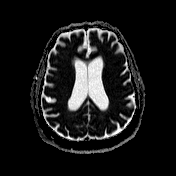
[im 54/54]
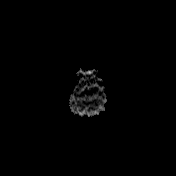

[Series 7: T1 · sagittal · 5.0mm · 0.75mm/px · 2 of 24 slices shown (1 of 2)]
[im 1/24]
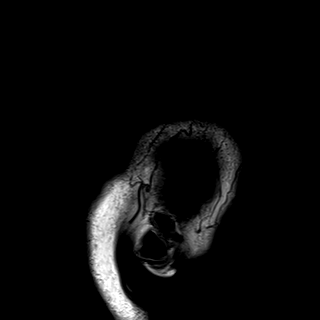
[im 24/24]
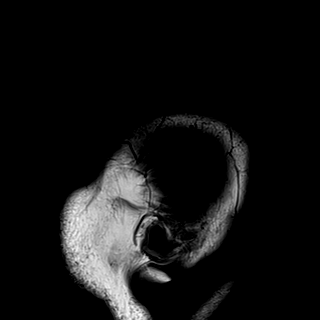

[Series 8: T2 · axial · 5.0mm · 0.62mm/px · z∈[-33,+129]mm · 2 of 26 slices shown (1 of 2)]
[im 1/26]
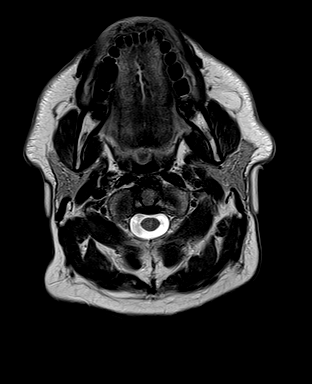
[im 26/26]
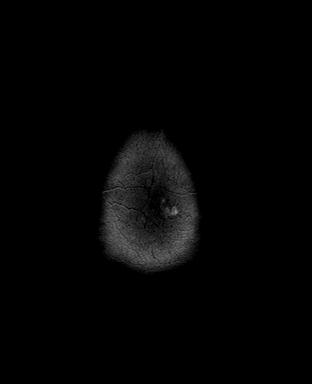

[Series 9: mip_images(sw) · axial · 24.0mm · 0.75mm/px · z∈[-24,+119]mm · 4 of 49 slices shown]
[im 1/49]
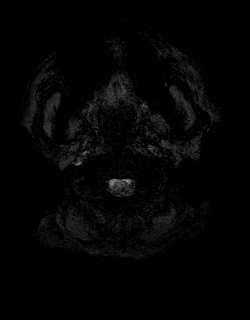
[im 17/49]
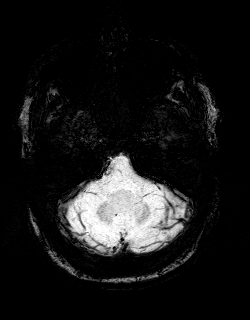
[im 33/49]
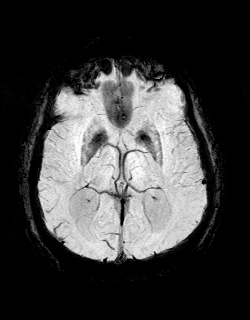
[im 49/49]
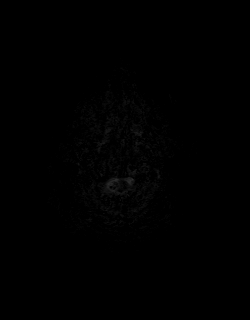

[Series 10: swi_images · axial · 3.0mm · 0.75mm/px · z∈[-34,+130]mm · 4 of 56 slices shown]
[im 1/56]
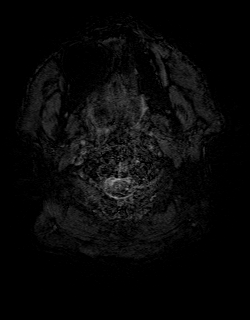
[im 19/56]
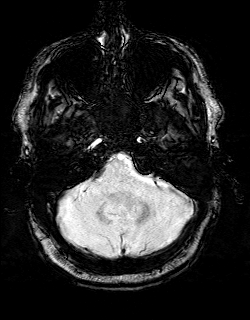
[im 37/56]
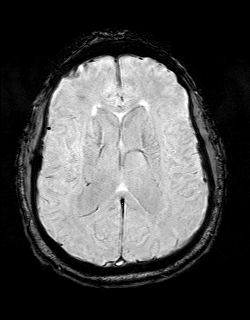
[im 56/56]
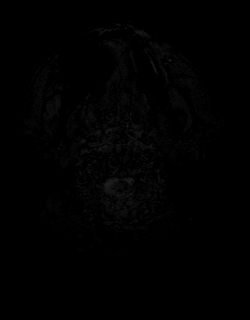

[Series 11: FLAIR · axial · 3.0mm · 0.75mm/px · z∈[-31,+124]mm · 2 of 27 slices shown]
[im 1/27]
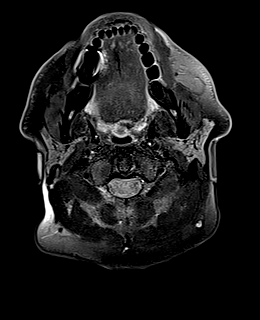
[im 27/27]
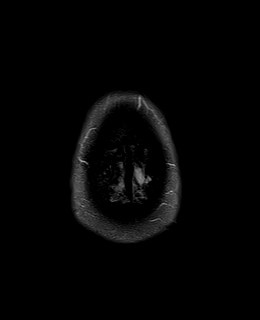

[Series 12: T1 · axial · 1.0mm · 0.94mm/px · z∈[-31,+127]mm · 12 of 160 slices shown (2 of 2)]
[im 1/160]
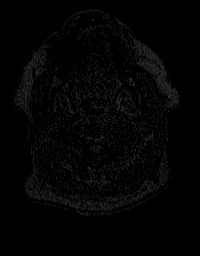
[im 15/160]
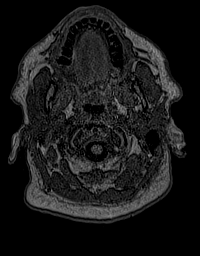
[im 29/160]
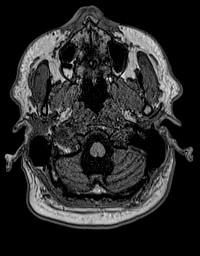
[im 44/160]
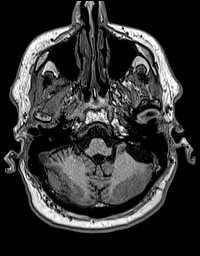
[im 58/160]
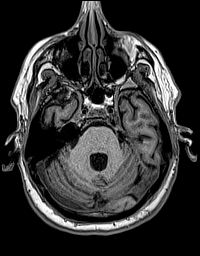
[im 73/160]
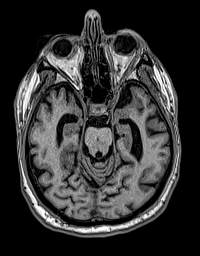
[im 87/160]
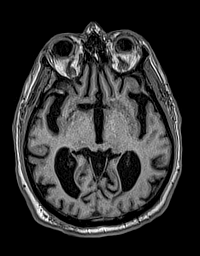
[im 102/160]
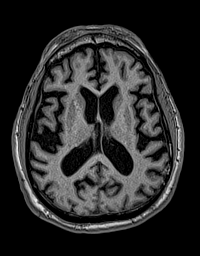
[im 116/160]
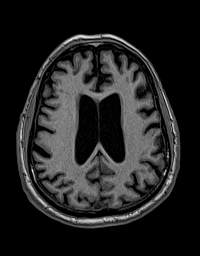
[im 131/160]
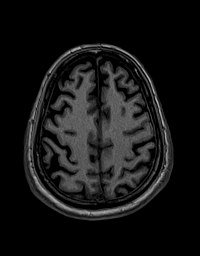
[im 145/160]
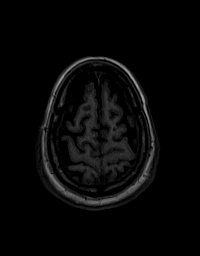
[im 160/160]
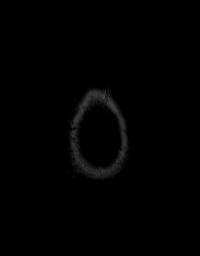

[Series 13: DWI · coronal · 5.0mm · 1.31mm/px · 5 of 72 slices shown (3 of 4)]
[im 1/72]
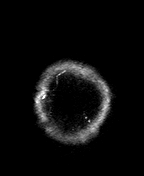
[im 18/72]
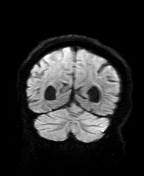
[im 36/72]
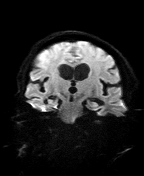
[im 54/72]
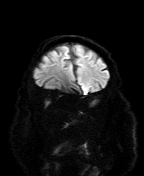
[im 72/72]
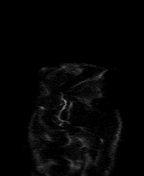

[Series 14: DWI · coronal · 5.0mm · 1.31mm/px · 3 of 36 slices shown (4 of 4)]
[im 1/36]
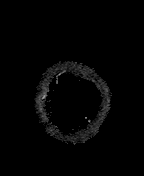
[im 18/36]
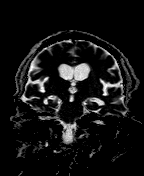
[im 36/36]
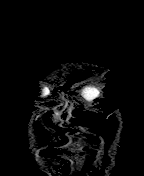

[Series 15: T2 · coronal · 5.0mm · 0.57mm/px · 3 of 36 slices shown (2 of 2)]
[im 1/36]
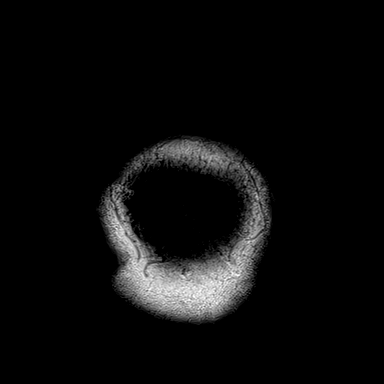
[im 18/36]
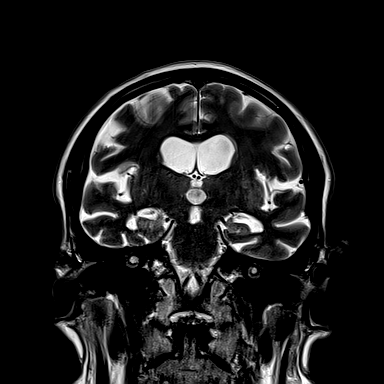
[im 36/36]
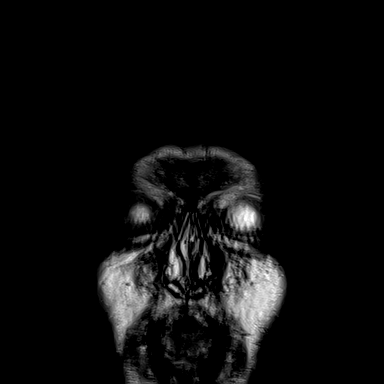

[48 of 48 positions shown; findings below may reference images not displayed]

FINDINGS: Brain:

Please note evaluation for intracranial metastatic disease is
limited on this noncontrast study.

There are few scattered punctate foci of T2/FLAIR hyperintensity
within the cerebral white matter, unchanged and nonspecific.

Stable moderate correct atrophy with somewhat disproportionate
involvement of the medial temporal lobes.

There is no acute infarct.

No evidence of intracranial mass.

No chronic intracranial blood products.

No extra-axial fluid collection.

No midline shift.

Vascular: Expected proximal arterial flow voids.

Skull and upper cervical spine: No focal marrow lesion.

Sinuses/Orbits: Visualized orbits show no acute finding. Minimal
ethmoid sinus mucosal thickening. No significant mastoid effusion.
IMPRESSION: 1. Please note evaluation for intracranial metastatic disease is
limited on this non-contrast exam.
2. No evidence of acute intracranial abnormality.
3. Stable moderate parenchymal atrophy with somewhat
disproportionate involvement of the medial temporal lobes.
4. A few scattered punctate T2 hyperintense signal changes within
the cerebral white matter are unchanged and nonspecific.
5. Minimal ethmoid sinus mucosal thickening.

## 2021-02-17 IMAGING — DX DG ABDOMEN 1V
1 series · 1 of 1 positions shown · non-contrast
Comparison: 08/05/2019 abdominal film [address] scan 08/28/2019

CLINICAL DATA: Intractable nausea and vomiting. History of
esophageal cancer.

EXAM:
ABDOMEN - 1 VIEW

[abdomen kub]
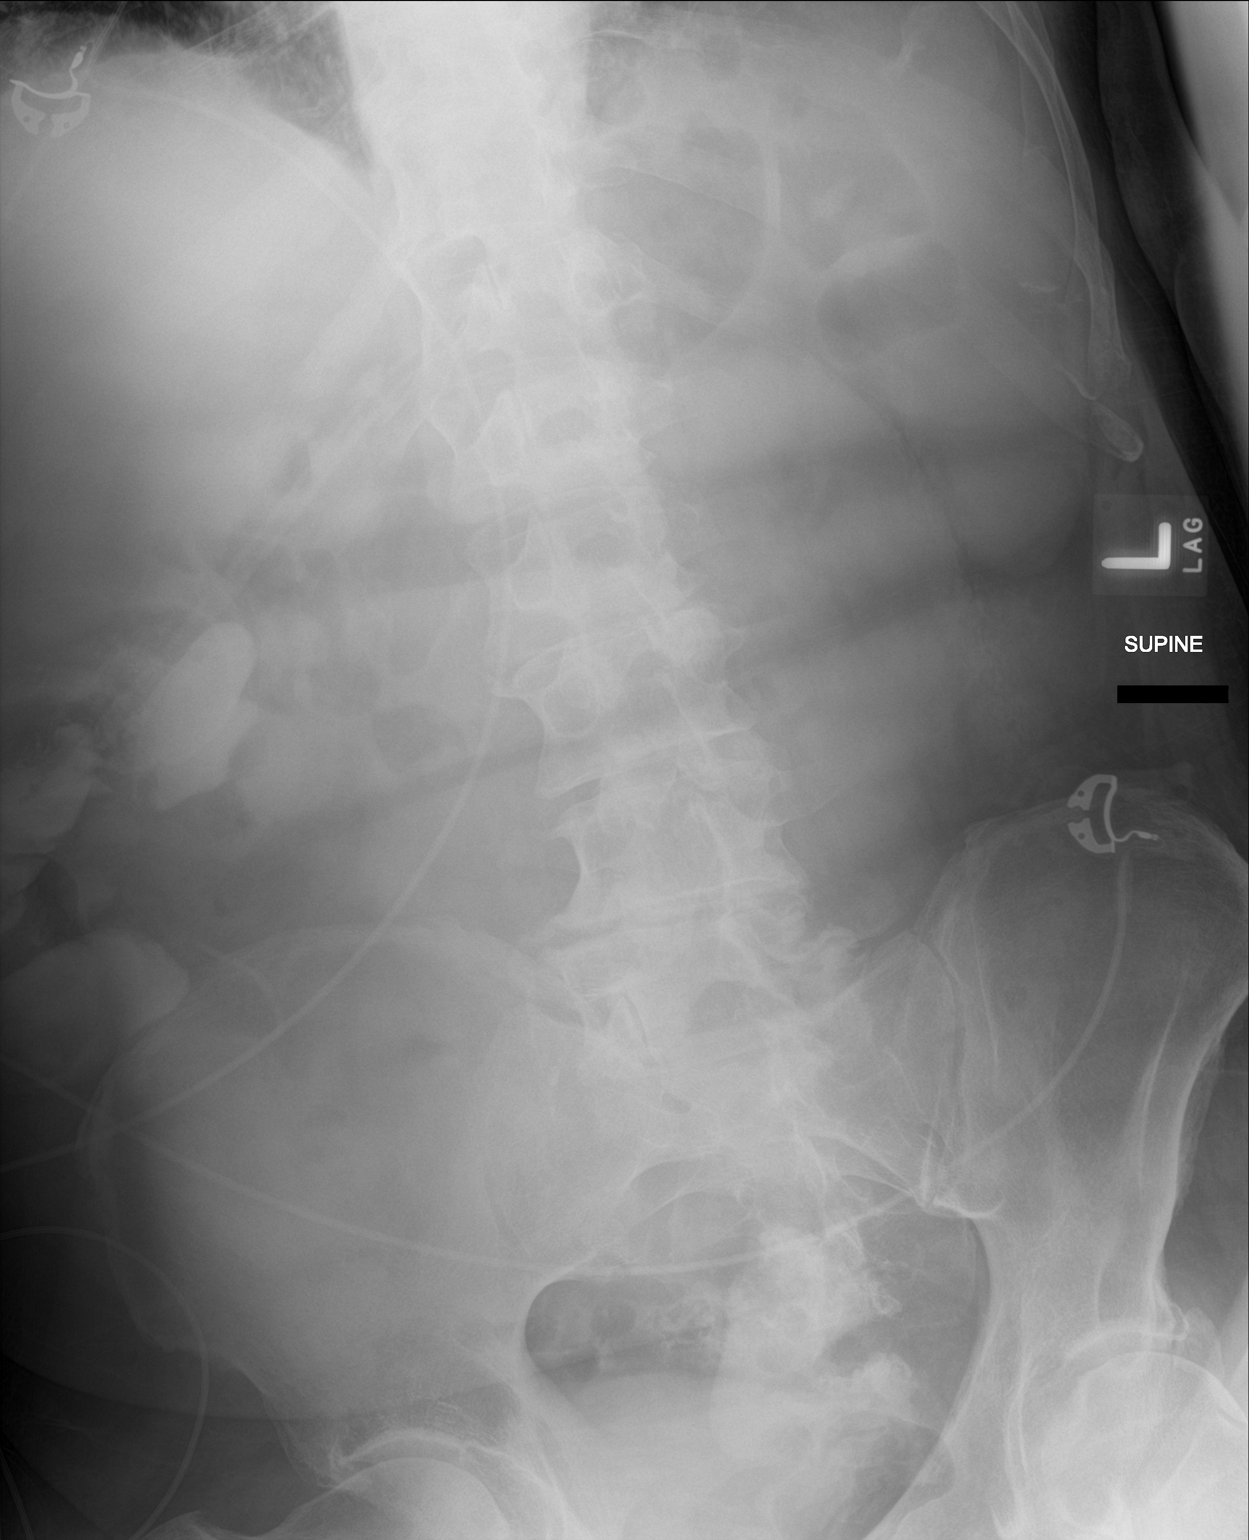

[1 of 1 positions shown; findings below may reference images not displayed]

FINDINGS: There is scattered contrast in the right colon and rectum. No
findings for obstruction or perforation.
IMPRESSION: No findings for bowel obstruction.
